# Patient Record
Sex: Female | Born: 1940 | Race: White | Hispanic: No | Smoking: Former smoker
Health system: Southern US, Community
[De-identification: ages and names within clinical notes are randomized; demographics above are authoritative.]

## PROBLEM LIST (undated history)

## (undated) DIAGNOSIS — E785 Hyperlipidemia, unspecified: Secondary | ICD-10-CM

## (undated) DIAGNOSIS — F39 Unspecified mood [affective] disorder: Secondary | ICD-10-CM

## (undated) DIAGNOSIS — I739 Peripheral vascular disease, unspecified: Secondary | ICD-10-CM

## (undated) DIAGNOSIS — F329 Major depressive disorder, single episode, unspecified: Secondary | ICD-10-CM

## (undated) DIAGNOSIS — I493 Ventricular premature depolarization: Secondary | ICD-10-CM

## (undated) DIAGNOSIS — F32A Depression, unspecified: Secondary | ICD-10-CM

## (undated) DIAGNOSIS — I779 Disorder of arteries and arterioles, unspecified: Secondary | ICD-10-CM

## (undated) DIAGNOSIS — E039 Hypothyroidism, unspecified: Secondary | ICD-10-CM

## (undated) HISTORY — DX: Unspecified mood (affective) disorder: F39

## (undated) HISTORY — PX: BREAST BIOPSY: SHX20

## (undated) HISTORY — DX: Ventricular premature depolarization: I49.3

## (undated) HISTORY — DX: Peripheral vascular disease, unspecified: I73.9

## (undated) HISTORY — DX: Hypothyroidism, unspecified: E03.9

## (undated) HISTORY — DX: Major depressive disorder, single episode, unspecified: F32.9

## (undated) HISTORY — DX: Hyperlipidemia, unspecified: E78.5

## (undated) HISTORY — DX: Depression, unspecified: F32.A

## (undated) HISTORY — DX: Disorder of arteries and arterioles, unspecified: I77.9

---

## 1988-01-20 HISTORY — PX: BREAST EXCISIONAL BIOPSY: SUR124

## 1994-01-19 HISTORY — PX: BREAST EXCISIONAL BIOPSY: SUR124

## 2001-02-21 ENCOUNTER — Encounter: Payer: Self-pay | Admitting: Internal Medicine

## 2001-02-21 ENCOUNTER — Encounter: Admission: RE | Admit: 2001-02-21 | Discharge: 2001-02-21 | Payer: Self-pay | Admitting: Internal Medicine

## 2001-06-23 ENCOUNTER — Emergency Department (HOSPITAL_COMMUNITY): Admission: EM | Admit: 2001-06-23 | Discharge: 2001-06-23 | Payer: Self-pay | Admitting: Emergency Medicine

## 2001-07-27 ENCOUNTER — Inpatient Hospital Stay (HOSPITAL_COMMUNITY): Admission: EM | Admit: 2001-07-27 | Discharge: 2001-08-01 | Payer: Self-pay | Admitting: Psychiatry

## 2001-07-27 ENCOUNTER — Encounter: Payer: Self-pay | Admitting: Emergency Medicine

## 2002-05-29 ENCOUNTER — Encounter: Admission: RE | Admit: 2002-05-29 | Discharge: 2002-05-29 | Payer: Self-pay | Admitting: Internal Medicine

## 2002-05-29 ENCOUNTER — Encounter: Payer: Self-pay | Admitting: Internal Medicine

## 2002-10-19 ENCOUNTER — Encounter: Payer: Self-pay | Admitting: Emergency Medicine

## 2002-10-19 ENCOUNTER — Emergency Department (HOSPITAL_COMMUNITY): Admission: EM | Admit: 2002-10-19 | Discharge: 2002-10-19 | Payer: Self-pay | Admitting: Emergency Medicine

## 2002-10-19 ENCOUNTER — Emergency Department (HOSPITAL_COMMUNITY): Admission: EM | Admit: 2002-10-19 | Discharge: 2002-10-20 | Payer: Self-pay | Admitting: Emergency Medicine

## 2005-01-29 ENCOUNTER — Other Ambulatory Visit: Admission: RE | Admit: 2005-01-29 | Discharge: 2005-01-29 | Payer: Self-pay | Admitting: Internal Medicine

## 2005-05-12 ENCOUNTER — Ambulatory Visit: Payer: Self-pay | Admitting: Internal Medicine

## 2005-06-04 ENCOUNTER — Encounter (INDEPENDENT_AMBULATORY_CARE_PROVIDER_SITE_OTHER): Payer: Self-pay | Admitting: *Deleted

## 2005-06-04 ENCOUNTER — Ambulatory Visit: Payer: Self-pay | Admitting: Internal Medicine

## 2005-06-17 ENCOUNTER — Ambulatory Visit (HOSPITAL_COMMUNITY): Payer: Self-pay | Admitting: *Deleted

## 2005-11-05 ENCOUNTER — Ambulatory Visit (HOSPITAL_COMMUNITY): Payer: Self-pay | Admitting: *Deleted

## 2006-02-02 ENCOUNTER — Ambulatory Visit (HOSPITAL_COMMUNITY): Payer: Self-pay | Admitting: *Deleted

## 2007-08-16 ENCOUNTER — Ambulatory Visit (HOSPITAL_COMMUNITY): Payer: Self-pay | Admitting: *Deleted

## 2007-08-22 ENCOUNTER — Emergency Department (HOSPITAL_COMMUNITY): Admission: EM | Admit: 2007-08-22 | Discharge: 2007-08-22 | Payer: Self-pay | Admitting: Emergency Medicine

## 2007-08-24 ENCOUNTER — Ambulatory Visit (HOSPITAL_BASED_OUTPATIENT_CLINIC_OR_DEPARTMENT_OTHER): Admission: RE | Admit: 2007-08-24 | Discharge: 2007-08-24 | Payer: Self-pay | Admitting: *Deleted

## 2008-02-07 ENCOUNTER — Ambulatory Visit (HOSPITAL_COMMUNITY): Payer: Self-pay | Admitting: *Deleted

## 2009-09-09 ENCOUNTER — Encounter: Admission: RE | Admit: 2009-09-09 | Discharge: 2009-09-09 | Payer: Self-pay | Admitting: Internal Medicine

## 2010-04-29 ENCOUNTER — Other Ambulatory Visit (HOSPITAL_COMMUNITY)
Admission: RE | Admit: 2010-04-29 | Discharge: 2010-04-29 | Disposition: A | Discharge status: Home / Self Care | Payer: Medicare Other | Source: Ambulatory Visit | Attending: Internal Medicine | Admitting: Internal Medicine

## 2010-04-29 ENCOUNTER — Other Ambulatory Visit: Payer: Self-pay | Admitting: Physician Assistant

## 2010-04-29 DIAGNOSIS — Z124 Encounter for screening for malignant neoplasm of cervix: Secondary | ICD-10-CM | POA: Insufficient documentation

## 2010-06-06 NOTE — Discharge Summary (Signed)
NAMEVANISSA, STRENGTH                             ACCOUNT NO.:  [id number]   MEDICAL RECORD NO.:  [id number]                   PATIENT TYPE:  IPS   LOCATION:  0506                                 FACILITY:  [facility]   PHYSICIAN:  Jeanice Lim, MD                DATE OF BIRTH:  Jun 20, 1940   DATE OF ADMISSION:  07/27/2001  DATE OF DISCHARGE:  08/01/2001                                 DISCHARGE SUMMARY   IDENTIFYING DATA:  This 70 year old divorced Caucasian female admitted with  a history of psychosis and confusion after, I think, several falls at home,  auditory hallucinations with a history of bipolar disorder.   MEDICATIONS:  Alprazolam, Celexa, Synthroid, Effexor, Zyprexa, Luvox,  Topamax and Depakote.   ALLERGIES:  CODEINE.   PHYSICAL EXAMINATION:  Essentially within normal limits.  Neurologically  nonfocal.  CT scan negative.   LABORATORY DATA:  CBC and CMET within normal limits.  Alcohol level less  than 5.   MENTAL STATUS EXAM:  Alert, middle-aged, casually dressed female.  Cooperative.  Fair eye contact.  Speech clear.  Mood anxious.  Affect  anxious.  Thought process goal directed.  Thought content positive for  paranoia and possible auditory hallucinations.  Cognitively intact.  Judgment and insight fair to poor.   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  2. Bipolar disorder, by history.   AXIS II:  None.   AXIS III:  1. Hypothyroidism.  2. Elevated cholesterol.   AXIS IV:  Moderate (problems related to support system).   AXIS V:  30/55.   HOSPITAL COURSE:  The patient was admitted and ordered routine p.r.n.  medications and underwent further monitoring.  She was continued on previous  medications.  Family meeting was held.  The patient responded to Zyprexa and  Effexor with improvement in mood and no side effects.   CONDITION ON DISCHARGE:  Improved.  Mood was more euthymic.  Affect  brighter.  Thought processes goal  directed.  Thought content negative for  dangerous ideation or psychotic symptoms.  The patient reported motivation  to be compliant with follow-up plan.   DISCHARGE MEDICATIONS:  1. Zyprexa 5 mg q.h.s.  2. Levothyroxine 100 mcg q.a.m.  3. Effexor XR 150 mg b.i.d.  4. Xanax 0.5 mg q.6h. p.r.n. severe anxiety.   FOLLOW UP:  __________ and Dr. Evelene Croon on August 09, 2001 at 5:45 p.m.   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  2. Bipolar disorder, by history.   AXIS II:  None.   AXIS III:  1. Hypothyroidism.  2. Elevated cholesterol.   AXIS IV:  Moderate (problems related to support system).   AXIS V:  Global Assessment of Functioning on discharge 55.  Jeanice Lim, MD    JEM/MEDQ  D:  09/14/2001  T:  09/17/2001  Job:  [id number]

## 2010-06-06 NOTE — Op Note (Signed)
NAMEKarsynn, Shari Horn                   ACCOUNT NO.:  [id number]   MEDICAL RECORD NO.:  [id number]          PATIENT TYPE:  AMB   LOCATION:  DSC                          FACILITY:  [facility]   PHYSICIAN:  Tennis Must Meyerdierks, M.D.DATE OF BIRTH:  1940-12-10   DATE OF PROCEDURE:  DATE OF DISCHARGE:                               OPERATIVE REPORT   PREOPERATIVE DIAGNOSIS:  Proximal phalanx fractures left long, ring, and  small fingers with distal metacarpal fracture left small finger.   POSTOPERATIVE DIAGNOSIS:  Proximal phalanx fractures left long, ring,  and small fingers with distal metacarpal fracture left small finger.   PROCEDURE:  Closed reduction and percutaneous pinning proximal phalanx  fractures left long and ring fingers.   SURGEON:  Lowell Bouton, MD   ANESTHESIA:  General.   OPERATIVE FINDINGS:  The patient had displaced transverse fracture  through the base of the proximal phalanx of the long and ring fingers.  The small finger proximal phalanx fracture and metacarpal fracture were  not significantly displaced.  There were abrasions, but no open  fractures.  The ring finger was angulated and rotated.  The long finger  was angulated.   PROCEDURE:  Under general anesthesia with a tourniquet on the left arm,  the left hand was prepped and draped in the usual fashion, and after  exsanguinating the limb the tourniquet was inflated to 250 mmHg.  The  long finger was treated first with a closed reduction of the fracture.  A 0.045 K-wire was placed longitudinally across the MP joint into the  proximal phalanx.  Fluoroscopy was used, and x-rays showed good position  of the fracture.  A second longitudinal K-wire was then applied to  control rotation.  It paralleled the first.  X-rays showed good  position, and these pins were bent over and left protruding from the  skin.  The ring finger was then addressed and was reduced trying to  correct the rotation and the  angulation.  A 0.045 K-wire was placed  longitudinally through the metacarpal head into the proximal phalanx.  X-  rays showed good position, and clinically there was no rotational  malalignment.  A second K-wire was then placed parallel to the first,  and x-rays showed good position of the K-wires.  They were bent over and  left protruding from the skin.  The small finger fractures were stable  and were not pinned.  Sterile dressings were then applied followed by a  dorsal protective splint.  Marcaine 0.5% digital blocks were inserted  for pain control.  The tourniquet was released with good circulation to  the hand.  The patient went to the recovery room awake and stable in  good condition.      Lowell Bouton, M.D.  Electronically Signed     EMM/MEDQ  D:  08/25/2007  T:  08/25/2007  Job:  [id number]

## 2010-06-06 NOTE — H&P (Signed)
[facility]  Patient:    CORIANA, ANGELLO Visit Number: [id number] MRN: [id number]          Service Type: EMS Location: ED Attending Physician:  Sandi Raveling Dictated by:   Candi Leash. Orsini, N.P. Admit Date:  07/27/2001 Discharge Date: 07/27/2001                     Psychiatric Admission Assessment  IDENTIFYING INFORMATION:  A 70 year old divorced white female, voluntarily admitted on July 27, 2001.  HISTORY OF PRESENT ILLNESS:  The patient presents with a history of psychosis and confusion, reports her medications have been increased about a month ago and has had several falls at home.  She has hit her head a couple of times on occasion.  Having positive auditory hallucinations, hearing singing and ______ voices.  Positive paranoia, has accusing her neighbor of hitting her walls on the outside and feeling people around her.  She denies any depressive symptoms or suicidal ideation or homicidal ideation.  She reports she started having tics and hand tremors and was started on Inderal q.h.s.  She reports no significant stressors in her life.  She states her sleep has been satisfactory with Xanax that she takes in the evening.  Her appetite has been satisfactory.  PAST PSYCHIATRIC HISTORY:  Sees Dr. Evelene Croon.  On a remote basis was seeing a psychiatrist at [facility].  Last seen was 1 week ago.  The patient was hospitalized at [facility] for depression in 1994, has a history of bipolar disorder.  SOCIAL HISTORY:   She is a 70 year old divorced white female, divorced for 9 years, 2 grown children.  She lives alone, she is unemployed, no legal charges.  FAMILY HISTORY:  None.  ALCOHOL DRUG HISTORY:  Nonsmoker, denies any alcohol or substance abuse.  PAST MEDICAL HISTORY:  Primary care Rhett Najera is Dr. Julieanne Manson in [place] on [facility] and psychiatrist in [place].  The patient reports her last name is Rezlin.   Medical problems are hypothyroidism.  The patient has been on thyroid medications, alprazolam 0.5 mg 1 q.6h., Celexa 40 2 pills in the morning, Synthroid 0.1  mg 1 q.a.m., Effexor XR 150 mg 1 t.i.d. at 1 p.m., 5 p.m. and 8 p.m., Zyprexa 2.5 mg 2 q.h.s., Eskalith CR 450 mg 1 a.m., 1 at 1700.  Luvox 50 mg 2 q.h.s., propranolol 10 mg 1 q.h.s., Niaspan 1000 mg 1 q.a.m., 1 at 5 p.m., Depakote 500 mg 1 q.a.m., 1 at 1700, 1  q.6h. p.r.n., Topamax 25 mg 4 in the morning, 2 at 1 p.m. and 2 at 5 p.m.  DRUG ALLERGIES:  CODEINE.  PHYSICAL EXAMINATION:  Performed at [facility].  The patient is CAT scan negative.  RBC is 3.6, hematocrit is 35.9.  CMET is within normal limits. Alcohol level was less than 5.  MENTAL STATUS EXAMINATION:  She is an alert, middle-aged, casually dressed female, cooperative, fair eye contact.  Speech is clear, mood is anxious, affect is anxious.  Thought process are coherent, goal directed.  No current auditory or visual hallucinations, no suicidal or homicidal ideations, continues with some paranoid ideation.  Cognitive:  Her memory is fair, her judgment and insight are fair, oriented x3.  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            2. Bipolar disorder by history. Axis II:   Deferred. Axis III:  Hypothyroidism, elevated cholesterol. Axis  IV:   Deferred. Axis V:    Current is 30, this past year 65-70.  PLAN:  Voluntary admission for psychosis and increased confusion.  Contract for safety, check every 15 minutes.  The patient is considered to be a fall risk.  Treatment plan was discussed with Dr. Kathrynn Running.  We will resume her Effexor XR on a b.i.d. basis.  We will have Ativan available for anxiety and Zyprexa.  Will contact her daughter for background information and who has been following the patients psychiatric illness.  The patient to follow up with mental health or private psychiatrist.  TENTATIVE LENGTH OF CARE:  4-5 days or  more depending on patients response to medications. Dictated by:   Candi Leash. Orsini, N.P. Attending Physician:  Sandi Raveling DD:  07/29/01 TD:  07/31/01 Job: [id number]

## 2010-06-09 ENCOUNTER — Encounter: Payer: Self-pay | Admitting: Internal Medicine

## 2010-10-17 LAB — CBC
HCT: 37.6
Hemoglobin: 13
MCHC: 34.6
Platelets: 287
RDW: 13.6

## 2010-10-17 LAB — POCT I-STAT, CHEM 8
BUN: 20
Calcium, Ion: 1.17
Creatinine, Ser: 0.9
Glucose, Bld: 79
Hemoglobin: 13.3
TCO2: 24

## 2010-10-17 LAB — DIFFERENTIAL
Basophils Absolute: 0.1
Basophils Relative: 1
Eosinophils Relative: 2
Lymphocytes Relative: 27
Monocytes Absolute: 1
Neutro Abs: 6.1

## 2010-10-17 LAB — PROTIME-INR: Prothrombin Time: 13.1

## 2011-12-20 HISTORY — PX: OTHER SURGICAL HISTORY: SHX169

## 2011-12-20 HISTORY — PX: TRANSTHORACIC ECHOCARDIOGRAM: SHX275

## 2011-12-21 ENCOUNTER — Other Ambulatory Visit (HOSPITAL_COMMUNITY): Payer: Self-pay | Admitting: Internal Medicine

## 2011-12-21 DIAGNOSIS — I6529 Occlusion and stenosis of unspecified carotid artery: Secondary | ICD-10-CM

## 2011-12-21 DIAGNOSIS — R0600 Dyspnea, unspecified: Secondary | ICD-10-CM

## 2011-12-21 DIAGNOSIS — I493 Ventricular premature depolarization: Secondary | ICD-10-CM

## 2011-12-21 DIAGNOSIS — R002 Palpitations: Secondary | ICD-10-CM

## 2012-01-06 ENCOUNTER — Ambulatory Visit (HOSPITAL_COMMUNITY)
Admission: RE | Admit: 2012-01-06 | Discharge: 2012-01-06 | Disposition: A | Discharge status: Home / Self Care | Payer: Medicare Other | Source: Ambulatory Visit | Attending: Internal Medicine | Admitting: Internal Medicine

## 2012-01-06 DIAGNOSIS — R002 Palpitations: Secondary | ICD-10-CM

## 2012-01-06 DIAGNOSIS — R0600 Dyspnea, unspecified: Secondary | ICD-10-CM

## 2012-01-06 DIAGNOSIS — I6529 Occlusion and stenosis of unspecified carotid artery: Secondary | ICD-10-CM | POA: Insufficient documentation

## 2012-01-06 DIAGNOSIS — I493 Ventricular premature depolarization: Secondary | ICD-10-CM

## 2012-01-06 NOTE — Progress Notes (Signed)
2D Echo Performed 01/06/2012    Warnell Rasnic, RCS  

## 2012-01-06 NOTE — Progress Notes (Signed)
Carotid duplex completed 

## 2012-02-01 ENCOUNTER — Other Ambulatory Visit (HOSPITAL_COMMUNITY)
Admission: RE | Admit: 2012-02-01 | Discharge: 2012-02-01 | Disposition: A | Discharge status: Home / Self Care | Payer: Medicare Other | Source: Ambulatory Visit | Attending: Family Medicine | Admitting: Family Medicine

## 2012-02-01 ENCOUNTER — Other Ambulatory Visit: Payer: Self-pay | Admitting: Family Medicine

## 2012-02-01 DIAGNOSIS — Z0000 Encounter for general adult medical examination without abnormal findings: Secondary | ICD-10-CM | POA: Insufficient documentation

## 2012-12-09 ENCOUNTER — Telehealth (HOSPITAL_COMMUNITY): Payer: Self-pay | Admitting: *Deleted

## 2012-12-19 ENCOUNTER — Encounter: Payer: Self-pay | Admitting: *Deleted

## 2012-12-20 ENCOUNTER — Ambulatory Visit (INDEPENDENT_AMBULATORY_CARE_PROVIDER_SITE_OTHER): Payer: Medicare Other | Admitting: Internal Medicine

## 2012-12-20 ENCOUNTER — Encounter: Payer: Self-pay | Admitting: Internal Medicine

## 2012-12-20 VITALS — BP 128/82 | HR 82 | Ht 66.0 in | Wt 199.5 lb

## 2012-12-20 DIAGNOSIS — I4949 Other premature depolarization: Secondary | ICD-10-CM

## 2012-12-20 DIAGNOSIS — E785 Hyperlipidemia, unspecified: Secondary | ICD-10-CM

## 2012-12-20 DIAGNOSIS — I493 Ventricular premature depolarization: Secondary | ICD-10-CM | POA: Insufficient documentation

## 2012-12-20 DIAGNOSIS — I779 Disorder of arteries and arterioles, unspecified: Secondary | ICD-10-CM

## 2012-12-20 DIAGNOSIS — R002 Palpitations: Secondary | ICD-10-CM | POA: Insufficient documentation

## 2012-12-20 NOTE — Patient Instructions (Signed)
Your physician wants you to follow-up in: 1 year. You will receive a reminder letter in the mail two months in advance. If you don't receive a letter, please call our office to schedule the follow-up appointment.  Please schedule your carotid doppler.   Please have fasting blood work.

## 2012-12-20 NOTE — Progress Notes (Signed)
OFFICE NOTE  Chief Complaint:  Mild DOE  Primary Care Physician: Cain Saupe, MD  HPI:  Shari Horn is a 72 year old female who was referred to me for irregular heartbeat and PVCs, as well as an abnormal screening carotid ultrasound. We repeated that carotid Doppler which indicates increased velocity on the left internal carotid up to 206 cm/second in the posterior part of the internal carotid artery consistent with 50-69% left internal carotid artery stenosis. There was a mild amount of plaque in the right carotid, 0-49% stenosis, neither of which meets criteria for intervention at this time. In addition we performed an echocardiogram in the office, indicating an EF of 60-65%, mild diastolic dysfunction. There was trivial mitral regurgitation and aortic regurgitation, but no significant stenosis. I reviewed the results with her today and feel like she is doing quite well but would recommend a repeat lipid profile and more aggressive lipid management as necessary. I have copied you on that laboratory work so that you may review it, and we will plan to see her back annually with repeat carotid Dopplers at that time. She, in fact, was due to go over repeat dopplers today, but has not had them performed.  She was having palpitations, PVCs which are not as worrisome given the fact that she has normal LV function and no signs or symptoms of ischemia. I recommended decreasing her caffeine intake which may be difficult to do as she is reportedly self-addicted, but she does drink about 1 small pot, or 4 cups of coffee every day and that certainly can contribute significantly to her PVCs. She now reports the palpitations have improved.  Finally, she recently had cholesterol testing through her primary care provider. Her total cholesterol was 233, triglycerides 183, HDL 41 LDL 156. Based on this for right torn was increased from 10/20 mg to 10/40 mg. Her goal LDL, however is less than 70, which means she needs  an additional 50% reduction in her cholesterol. I'm not sure that this is achievable with an increase in her simvastatin by 20 mg.  PMHx:  Past Medical History  Diagnosis Date  . PVC's (premature ventricular contractions)   . Dyslipidemia   . Mood disorder     BPD  . Hypothyroidism   . Carotid artery disease     Past Surgical History  Procedure Laterality Date  . Transthoracic echocardiogram  12/2011    EF 60-65%, grade 1 diastolic dysfunction; trivial AV regurg; calcified MV annulus  . Carotid doppler  12/2011    0-49% RICA stenosis, 50-69% LICA stenosis    FAMHx:  Family History  Problem Relation Age of Onset  . Heart failure Mother   . Arthritis/Rheumatoid Father   . Heart Problems Maternal Grandmother   . Heart disease Brother     valve replacement  . Heart disease Child     SOCHx:   reports that she quit smoking about 40 years ago. Her smoking use included Cigarettes. She smoked 0.00 packs per day for 10 years. She has never used smokeless tobacco. She reports that she does not drink alcohol or use illicit drugs.  ALLERGIES:  Allergies  Allergen Reactions  . Codeine     ROS: A comprehensive review of systems was negative except for: Respiratory: positive for dyspnea on exertion  HOME MEDS: Current Outpatient Prescriptions  Medication Sig Dispense Refill  . aspirin 81 MG tablet Take 81 mg by mouth daily.      . clonazePAM (KLONOPIN) 0.5 MG tablet  Take 0.5 mg by mouth 3 (three) times daily as needed for anxiety.      Marland Kitchen ezetimibe-simvastatin (VYTORIN) 10-40 MG per tablet Take 1 tablet by mouth daily.      Marland Kitchen FLUoxetine (PROZAC) 20 MG tablet Take 60 mg by mouth daily.      Marland Kitchen levothyroxine (SYNTHROID, LEVOTHROID) 88 MCG tablet Take 88 mcg by mouth daily before breakfast.      . Multiple Vitamin (MULTIVITAMIN) capsule Take 1 capsule by mouth daily.      . QUEtiapine (SEROQUEL) 300 MG tablet Take 300 mg by mouth at bedtime.      . senna (SENOKOT) 8.6 MG TABS tablet  Take 1 tablet by mouth at bedtime.       No current facility-administered medications for this visit.    LABS/IMAGING: No results found for this or any previous visit (from the past 48 hour(s)). No results found.  VITALS: BP 128/82  Pulse 82  Ht 5\' 6"  (1.676 m)  Wt 199 lb 8 oz (90.493 kg)  BMI 32.22 kg/m2  EXAM: General appearance: alert and no distress Neck: supple, faint carotid bruit on the left Lungs: clear to auscultation bilaterally Heart: regular rate and rhythm, S1, S2 normal, no murmur, click, rub or gallop Abdomen: soft, non-tender; bowel sounds normal; no masses,  no organomegaly Extremities: extremities normal, atraumatic, no cyanosis or edema Pulses: 2+ and symmetric Skin: Skin color, texture, turgor normal. No rashes or lesions Neurologic: Grossly normal Psych: Mood, affect normal  EKG: Normal sinus rhythm at 82  ASSESSMENT: 1. Bilateral carotid artery disease 2. Dyslipidemia-not at goal LDL less than 70 3. Palpitations-resolved  PLAN: 1.   Mrs. Kulkarni is due for repeat Dopplers. I would like to see if there's been stability of her carotid stenoses over the past year. I am concerned that she has not been able to lose much weight and exercise more vigorously. Her cholesterol remains high an LDL goal of less than 70 he is not being achieved with Vytorin. I would like to repeat her cholesterol to see the benefit she's had over the past several months with the increase in her medicine. If her LDL remains elevated, I would change her cholesterol medication over to high-dose Crestor 40 mg and Zetia 10 mg.  I will contact her with the results of her Dopplers once we obtain them. Hopefully that will be in the next week as she is planning on going to [place] for very interesting trip.  Chrystie Nose, MD, [facility] Attending Cardiologist [facility]  Lyly Canizales C 12/20/2012, 6:00 PM

## 2012-12-21 ENCOUNTER — Other Ambulatory Visit (HOSPITAL_COMMUNITY): Payer: Self-pay | Admitting: Internal Medicine

## 2012-12-23 ENCOUNTER — Ambulatory Visit (HOSPITAL_COMMUNITY)
Admission: RE | Admit: 2012-12-23 | Discharge: 2012-12-23 | Disposition: A | Discharge status: Home / Self Care | Payer: Medicare Other | Source: Ambulatory Visit | Attending: Internal Medicine | Admitting: Internal Medicine

## 2012-12-23 DIAGNOSIS — I6529 Occlusion and stenosis of unspecified carotid artery: Secondary | ICD-10-CM | POA: Insufficient documentation

## 2012-12-23 DIAGNOSIS — I658 Occlusion and stenosis of other precerebral arteries: Secondary | ICD-10-CM | POA: Insufficient documentation

## 2012-12-23 NOTE — Progress Notes (Signed)
Carotid Duplex Completed. °Brianna L Mazza,RVT °

## 2012-12-27 LAB — NMR LIPOPROFILE WITH LIPIDS
Cholesterol, Total: 133 mg/dL (ref <200)
HDL Particle Number: 37 umol/L (ref >=30.5)
LDL Size: 20.1 nm — ABNORMAL LOW (ref >20.5)
LP-IR Score: 60 — ABNORMAL HIGH (ref <=45)
Large HDL-P: 2.9 umol/L — ABNORMAL LOW (ref >=4.8)
Large VLDL-P: 2.2 nmol/L (ref <=2.7)
Small LDL Particle Number: 872 nmol/L — ABNORMAL HIGH (ref <=527)

## 2012-12-30 ENCOUNTER — Telehealth: Payer: Self-pay | Admitting: *Deleted

## 2012-12-30 DIAGNOSIS — E785 Hyperlipidemia, unspecified: Secondary | ICD-10-CM

## 2012-12-30 DIAGNOSIS — I779 Disorder of arteries and arterioles, unspecified: Secondary | ICD-10-CM

## 2012-12-30 MED ORDER — EZETIMIBE 10 MG PO TABS: 10.0000 mg | ORAL_TABLET | Freq: Every day | ORAL | Status: DC

## 2012-12-30 MED ORDER — ROSUVASTATIN CALCIUM 20 MG PO TABS: 40.0000 mg | ORAL_TABLET | Freq: Every day | ORAL | Status: DC

## 2012-12-30 MED ORDER — ROSUVASTATIN CALCIUM 40 MG PO TABS: 40.0000 mg | ORAL_TABLET | Freq: Every day | ORAL | Status: DC

## 2012-12-30 NOTE — Telephone Encounter (Signed)
Message copied by Lindell Spar on Fri Dec 30, 2012  9:10 AM ------      Message from: Chrystie Nose      Created: Thu Dec 29, 2012  1:30 PM       LDL is 76.  Particle number still to high.  Change Vytorin to Crestor 40 mg daily and also add Zetia 10 mg daily.  Repeat lipid NMR in 3 months.            -Dr. Rennis Golden ------

## 2012-12-30 NOTE — Telephone Encounter (Signed)
Contacted patient with carotid doppler and NMR results. Instructed of new medications per Dr. Rennis Golden - meds, labs ordered.. Samples left at front desk for patient. Copy of labs mailed to patient with lab slips

## 2013-09-06 ENCOUNTER — Ambulatory Visit: Payer: Medicare Other | Admitting: Neurology

## 2013-09-07 ENCOUNTER — Ambulatory Visit (INDEPENDENT_AMBULATORY_CARE_PROVIDER_SITE_OTHER): Payer: Medicare Other | Admitting: Neurology

## 2013-09-07 ENCOUNTER — Encounter: Payer: Self-pay | Admitting: Neurology

## 2013-09-07 VITALS — BP 120/76 | HR 82 | Resp 16 | Ht 66.5 in | Wt 197.0 lb

## 2013-09-07 DIAGNOSIS — H81319 Aural vertigo, unspecified ear: Secondary | ICD-10-CM

## 2013-09-07 DIAGNOSIS — IMO0002 Reserved for concepts with insufficient information to code with codable children: Secondary | ICD-10-CM

## 2013-09-07 NOTE — Progress Notes (Signed)
NEUROLOGY CONSULTATION NOTE  Shari Horn MRN: [id number] DOB: Feb 21, 1940  Referring provider: Eloise Levels, NP Primary care provider: Dr. Antony Blackbird  Reason for consult:  dysequilibrium  Thank you for your kind referral of Shari Horn for consultation of the above symptoms. Although her history is well known to you, please allow me to reiterate it for the purpose of our medical record. Records and images were personally reviewed where available.  HISTORY OF PRESENT ILLNESS: This is a pleasant 73 year old right-handed woman with a history of hyperlipidemia, bilateral carotid artery disease, bipolar disorder, presenting for persistent dizziness that is provoked by head turning in bed.  She has a history of severe vertigo 8-10 years ago where she could not stand up and needed hospital admission. Around 2 months ago, she woke up with spinning sensation, nausea, and gait instability for 2 weeks. This improved, however she continues to have a sensation of floating and spinning when turning her head in bed, particularly to the right side. She notices a similar sensation when standing up and looking around.  She does not notice it when driving. The nausea has resolved. There is no associated headache, no focal numbness/tingling/weakness, incoordination, diplopia, dysarthria, dysphagia. She occasionally hears a clicking sound in her right ear for the past 4-6 months and had been recently evaluated by ENT.  On review of most recent ENT visit notes from 08/24/13, patient was seen for dizziness, assessment was cerumen impaction, vertigo, hearing loss, and chronic otitis externa. She had reported fullness in ears, itching, and possible hearing loss.  Cerumen removal was done and patient scheduled for hearing test.    She denies any recent illnesses or head injuries.  She did have chills when the vertigo was severe, but no fever.  She denies any neck/back pain, bowel/bladder dysfunction.  She has a pressure  in her temples today but states she does not normally get headaches.  She has been taking Seroquel for bipolar disorder for many years, and clonazepam twice a day for at least 10-12 years.  Her brother and sister have seizures, no family history of vertigo.  PAST MEDICAL HISTORY: Past Medical History  Diagnosis Date  . PVC's (premature ventricular contractions)   . Dyslipidemia   . Mood disorder     BPD  . Hypothyroidism   . Carotid artery disease     PAST SURGICAL HISTORY: Past Surgical History  Procedure Laterality Date  . Transthoracic echocardiogram  12/2011    EF 62-37%, grade 1 diastolic dysfunction; trivial AV regurg; calcified MV annulus  . Carotid doppler  12/2011    0-49% RICA stenosis, 62-83% LICA stenosis  . Breast biopsy      MEDICATIONS: Current Outpatient Prescriptions on File Prior to Visit  Medication Sig Dispense Refill  . aspirin 81 MG tablet Take 81 mg by mouth daily.      . clonazePAM (KLONOPIN) 0.5 MG tablet Take 0.5 mg by mouth 3 (three) times daily as needed for anxiety.      Marland Kitchen ezetimibe (ZETIA) 10 MG tablet Take 1 tablet (10 mg total) by mouth daily.  29 tablet  0  . FLUoxetine (PROZAC) 20 MG tablet Take 60 mg by mouth daily.      Marland Kitchen levothyroxine (SYNTHROID, LEVOTHROID) 88 MCG tablet Take 88 mcg by mouth daily before breakfast.      . Multiple Vitamin (MULTIVITAMIN) capsule Take 1 capsule by mouth daily.      . QUEtiapine (SEROQUEL) 300 MG tablet Take 300  mg by mouth at bedtime.      . rosuvastatin (CRESTOR) 20 MG tablet Take 2 tablets (40 mg total) by mouth daily.  56 tablet  0  . senna (SENOKOT) 8.6 MG TABS tablet Take 1 tablet by mouth at bedtime.       No current facility-administered medications on file prior to visit.    ALLERGIES: Allergies  Allergen Reactions  . Codeine Nausea And Vomiting    FAMILY HISTORY: Family History  Problem Relation Age of Onset  . Heart failure Mother   . Arthritis/Rheumatoid Father   . Heart Problems  Maternal Grandmother   . Heart disease Brother     valve replacement  . Heart disease Child     SOCIAL HISTORY: History   Social History  . Marital Status: Divorced    Spouse Name: N/A    Number of Children: 2  . Years of Education: master's   Occupational History  . Not on file.   Social History Main Topics  . Smoking status: Former Smoker -- 10 years    Types: Cigarettes    Quit date: 12/19/1972  . Smokeless tobacco: Never Used  . Alcohol Use: No     Comment: quit in 1973  . Drug Use: No  . Sexual Activity: Not on file   Other Topics Concern  . Not on file   Social History Narrative  . No narrative on file    REVIEW OF SYSTEMS: Constitutional: No fevers, chills, or sweats, no generalized fatigue, change in appetite Eyes: No visual changes, double vision, eye pain Ear, nose and throat: No hearing loss, ear pain, nasal congestion, sore throat Cardiovascular: No chest pain, palpitations Respiratory:  No shortness of breath at rest or with exertion, wheezes GastrointestinaI: No nausea, vomiting, diarrhea, abdominal pain, fecal incontinence Genitourinary:  No dysuria, urinary retention or frequency Musculoskeletal:  No neck pain, back pain Integumentary: No rash, pruritus, skin lesions Neurological: as above Psychiatric: No depression, insomnia, anxiety Endocrine: No palpitations, fatigue, diaphoresis, mood swings, change in appetite, change in weight, increased thirst Hematologic/Lymphatic:  No anemia, purpura, petechiae. Allergic/Immunologic: no itchy/runny eyes, nasal congestion, recent allergic reactions, rashes  PHYSICAL EXAM: Filed Vitals:   09/07/13 1023  BP: 120/76  Pulse: 82  Resp: 16   General: No acute distress Head:  Normocephalic/atraumatic Eyes: Fundoscopic exam shows bilateral sharp discs, no vessel changes, exudates, or hemorrhages Neck: supple, no paraspinal tenderness, full range of motion Back: No paraspinal tenderness Heart: regular rate  and rhythm Lungs: Clear to auscultation bilaterally. Vascular: No carotid bruits. Skin/Extremities: No rash, no edema Neurological Exam: Mental status: alert and oriented to person, place, and time, no dysarthria or aphasia, Fund of knowledge is appropriate.  Recent and remote memory are intact.  Attention and concentration are normal.    Able to name objects and repeat phrases. Cranial nerves: CN I: not tested CN II: pupils equal, round and reactive to light, visual fields intact, fundi unremarkable. CN III, IV, VI:  full range of motion, no nystagmus, no ptosis CN V: facial sensation intact CN VII: upper and lower face symmetric CN VIII: hearing intact to finger rub CN IX, X: gag intact, uvula midline CN XI: sternocleidomastoid and trapezius muscles intact CN XII: tongue midline Bulk & Tone: normal, no fasciculations. Motor: 5/5 throughout with no pronator drift. Sensation: intact to light touch, cold, pin, vibration and joint position sense.  No extinction to double simultaneous stimulation.  Romberg test negative Deep Tendon Reflexes: +2 throughout, no ankle clonus  Plantar responses: downgoing bilaterally Cerebellar: no incoordination on finger to nose, heel to shin. No dysdiadochokinesia Gait: narrow-based and steady, able to tandem walk adequately. Tremor: none Dix-Hallpike test showed nystagmus and reproduced symptoms on right head turn.  IMPRESSION: This is a pleasant 73 year old right-handed woman with a history of dyslipidemia, peripheral artery disease, bipolar disorder, presenting for positional vertigo with head turn to right.  Neurological exam normal, with Dix-Hallpike positive on right head turn, consistent with BPPV.  She will be referred for vestibular therapy.  If symptoms persist despite vestibular therapy, brain imaging will be considered. Findings were discussed with the patient, she knows to call our office for any change in symptoms and will follow-up in 2  months.  Thank you for allowing me to participate in the care of this patient. Please do not hesitate to call for any questions or concerns.   Ellouise Newer, M.D.  CC: Dr. Chapman Fitch

## 2013-09-07 NOTE — Patient Instructions (Addendum)
1. Schedule for vestibular therapy 2. Follow-up in 2 months, call our office for any change in symptoms

## 2013-09-08 ENCOUNTER — Encounter: Payer: Self-pay | Admitting: Neurology

## 2013-09-08 DIAGNOSIS — IMO0002 Reserved for concepts with insufficient information to code with codable children: Secondary | ICD-10-CM | POA: Insufficient documentation

## 2013-09-12 ENCOUNTER — Ambulatory Visit: Payer: Medicare Other | Attending: Neurology | Admitting: Physical Therapy

## 2013-09-12 DIAGNOSIS — H8110 Benign paroxysmal vertigo, unspecified ear: Secondary | ICD-10-CM | POA: Diagnosis not present

## 2013-09-12 DIAGNOSIS — IMO0001 Reserved for inherently not codable concepts without codable children: Secondary | ICD-10-CM | POA: Diagnosis not present

## 2013-09-15 ENCOUNTER — Ambulatory Visit: Payer: Medicare Other | Admitting: Physical Therapy

## 2013-09-15 DIAGNOSIS — IMO0001 Reserved for inherently not codable concepts without codable children: Secondary | ICD-10-CM | POA: Diagnosis not present

## 2013-09-19 ENCOUNTER — Ambulatory Visit: Payer: Medicare Other | Attending: Neurology

## 2013-09-19 DIAGNOSIS — H8110 Benign paroxysmal vertigo, unspecified ear: Secondary | ICD-10-CM | POA: Insufficient documentation

## 2013-09-19 DIAGNOSIS — IMO0001 Reserved for inherently not codable concepts without codable children: Secondary | ICD-10-CM | POA: Diagnosis present

## 2013-09-22 ENCOUNTER — Ambulatory Visit: Payer: Medicare Other | Admitting: Physical Therapy

## 2013-09-22 DIAGNOSIS — IMO0001 Reserved for inherently not codable concepts without codable children: Secondary | ICD-10-CM | POA: Diagnosis not present

## 2013-09-26 ENCOUNTER — Encounter: Payer: Medicare Other | Admitting: Physical Therapy

## 2013-09-27 ENCOUNTER — Encounter: Payer: Medicare Other | Admitting: Physical Therapy

## 2013-10-27 ENCOUNTER — Telehealth: Payer: Self-pay | Admitting: *Deleted

## 2013-10-27 NOTE — Telephone Encounter (Signed)
Patient cancelled follow up appointment will not be rescheduling at this time

## 2013-10-27 NOTE — Telephone Encounter (Signed)
Noted  

## 2013-11-03 ENCOUNTER — Ambulatory Visit: Payer: Medicare Other | Admitting: Neurology

## 2013-12-11 ENCOUNTER — Ambulatory Visit (HOSPITAL_COMMUNITY)
Admission: RE | Admit: 2013-12-11 | Discharge: 2013-12-11 | Disposition: A | Discharge status: Home / Self Care | Payer: Medicare Other | Source: Ambulatory Visit | Attending: Cardiology | Admitting: Cardiology

## 2013-12-11 ENCOUNTER — Telehealth: Payer: Self-pay | Admitting: Internal Medicine

## 2013-12-11 DIAGNOSIS — E785 Hyperlipidemia, unspecified: Secondary | ICD-10-CM

## 2013-12-11 DIAGNOSIS — I672 Cerebral atherosclerosis: Secondary | ICD-10-CM | POA: Insufficient documentation

## 2013-12-11 DIAGNOSIS — I739 Peripheral vascular disease, unspecified: Secondary | ICD-10-CM

## 2013-12-11 DIAGNOSIS — I779 Disorder of arteries and arterioles, unspecified: Secondary | ICD-10-CM

## 2013-12-11 NOTE — Progress Notes (Signed)
Carotid Duplex Completed. Azucena Dart, BS, RDMS, RVT  

## 2013-12-11 NOTE — Telephone Encounter (Signed)
Order placed for labs.

## 2013-12-13 LAB — NMR LIPOPROFILE WITH LIPIDS
Cholesterol, Total: 167 mg/dL (ref 100–199)
HDL PARTICLE NUMBER: 36.1 umol/L (ref >=30.5)
HDL Size: 8.5 nm — ABNORMAL LOW (ref >=9.2)
HDL-C: 44 mg/dL (ref >39)
LARGE VLDL-P: 4.9 nmol/L — AB (ref <=2.7)
LDL (calc): 100 mg/dL — ABNORMAL HIGH (ref 0–99)
LDL Particle Number: 1795 nmol/L — ABNORMAL HIGH (ref <1000)
LDL Size: 21 nm (ref >=20.8)
LP-IR SCORE: 82 — AB (ref <=45)
Large HDL-P: <1.3 umol/L — ABNORMAL LOW (ref >=4.8)
SMALL LDL PARTICLE NUMBER: 1049 nmol/L — AB (ref <=527)
Triglycerides: 116 mg/dL (ref 0–149)
VLDL Size: 53.5 nm — ABNORMAL HIGH (ref <=46.6)

## 2013-12-18 ENCOUNTER — Other Ambulatory Visit: Payer: Self-pay | Admitting: *Deleted

## 2013-12-18 ENCOUNTER — Telehealth: Payer: Self-pay | Admitting: Internal Medicine

## 2013-12-18 DIAGNOSIS — I779 Disorder of arteries and arterioles, unspecified: Secondary | ICD-10-CM

## 2013-12-18 DIAGNOSIS — I739 Peripheral vascular disease, unspecified: Principal | ICD-10-CM

## 2013-12-18 NOTE — Telephone Encounter (Signed)
Close encounter 

## 2014-01-10 ENCOUNTER — Emergency Department (HOSPITAL_COMMUNITY)
Admission: EM | Admit: 2014-01-10 | Discharge: 2014-01-10 | Disposition: A | Discharge status: Home / Self Care | Payer: Medicare Other | Attending: Emergency Medicine | Admitting: Emergency Medicine

## 2014-01-10 ENCOUNTER — Emergency Department (HOSPITAL_COMMUNITY): Payer: Medicare Other

## 2014-01-10 ENCOUNTER — Encounter (HOSPITAL_COMMUNITY): Payer: Self-pay | Admitting: *Deleted

## 2014-01-10 DIAGNOSIS — Z8659 Personal history of other mental and behavioral disorders: Secondary | ICD-10-CM | POA: Diagnosis not present

## 2014-01-10 DIAGNOSIS — Z87891 Personal history of nicotine dependence: Secondary | ICD-10-CM | POA: Insufficient documentation

## 2014-01-10 DIAGNOSIS — Z7982 Long term (current) use of aspirin: Secondary | ICD-10-CM | POA: Diagnosis not present

## 2014-01-10 DIAGNOSIS — R110 Nausea: Secondary | ICD-10-CM | POA: Diagnosis not present

## 2014-01-10 DIAGNOSIS — R079 Chest pain, unspecified: Secondary | ICD-10-CM | POA: Diagnosis present

## 2014-01-10 DIAGNOSIS — Z8679 Personal history of other diseases of the circulatory system: Secondary | ICD-10-CM | POA: Diagnosis not present

## 2014-01-10 DIAGNOSIS — E039 Hypothyroidism, unspecified: Secondary | ICD-10-CM | POA: Diagnosis not present

## 2014-01-10 DIAGNOSIS — Z79899 Other long term (current) drug therapy: Secondary | ICD-10-CM | POA: Diagnosis not present

## 2014-01-10 DIAGNOSIS — E785 Hyperlipidemia, unspecified: Secondary | ICD-10-CM | POA: Diagnosis not present

## 2014-01-10 LAB — CBC
HEMATOCRIT: 41.1 % (ref 36.0–46.0)
Hemoglobin: 13.9 g/dL (ref 12.0–15.0)
MCH: 32 pg (ref 26.0–34.0)
MCHC: 33.8 g/dL (ref 30.0–36.0)
MCV: 94.5 fL (ref 78.0–100.0)
PLATELETS: 254 10*3/uL (ref 150–400)
RBC: 4.35 MIL/uL (ref 3.87–5.11)
RDW: 12.8 % (ref 11.5–15.5)
WBC: 7.5 10*3/uL (ref 4.0–10.5)

## 2014-01-10 LAB — BASIC METABOLIC PANEL
ANION GAP: 7 (ref 5–15)
BUN: 9 mg/dL (ref 6–23)
CHLORIDE: 109 meq/L (ref 96–112)
CO2: 22 mmol/L (ref 19–32)
Calcium: 9.2 mg/dL (ref 8.4–10.5)
Creatinine, Ser: 0.88 mg/dL (ref 0.50–1.10)
GFR calc Af Amer: 73 mL/min — ABNORMAL LOW (ref >90)
GFR calc non Af Amer: 63 mL/min — ABNORMAL LOW (ref >90)
Glucose, Bld: 104 mg/dL — ABNORMAL HIGH (ref 70–99)
Potassium: 4.3 mmol/L (ref 3.5–5.1)
Sodium: 138 mmol/L (ref 135–145)

## 2014-01-10 LAB — TROPONIN I

## 2014-01-10 LAB — I-STAT TROPONIN, ED: Troponin i, poc: 0 ng/mL (ref 0.00–0.08)

## 2014-01-10 MED ORDER — SODIUM CHLORIDE 0.9 % IV BOLUS (SEPSIS)
500.0000 mL | Freq: Once | INTRAVENOUS | Status: AC
  Administered 2014-01-10: 500 mL via INTRAVENOUS

## 2014-01-10 MED ORDER — ONDANSETRON HCL 4 MG/2ML IJ SOLN
4.0000 mg | Freq: Once | INTRAMUSCULAR | Status: AC
  Administered 2014-01-10: 4 mg via INTRAVENOUS
  Filled 2014-01-10: qty 2

## 2014-01-10 MED ORDER — FENTANYL CITRATE 0.05 MG/ML IJ SOLN
50.0000 ug | Freq: Once | INTRAMUSCULAR | Status: AC
  Administered 2014-01-10: 50 ug via INTRAVENOUS
  Filled 2014-01-10: qty 2

## 2014-01-10 MED ORDER — KETOROLAC TROMETHAMINE 30 MG/ML IJ SOLN
30.0000 mg | Freq: Once | INTRAMUSCULAR | Status: AC
  Administered 2014-01-10: 30 mg via INTRAVENOUS
  Filled 2014-01-10: qty 1

## 2014-01-10 MED ORDER — IBUPROFEN 600 MG PO TABS
600.0000 mg | ORAL_TABLET | Freq: Three times a day (TID) | ORAL | Status: AC
Start: 2014-01-10 — End: 2014-01-13

## 2014-01-10 MED ORDER — HYDROCODONE-ACETAMINOPHEN 5-325 MG PO TABS: 1.0000 | ORAL_TABLET | Freq: Four times a day (QID) | ORAL | Status: DC | PRN

## 2014-01-10 NOTE — ED Notes (Signed)
Dr.Lockwood at bedside  

## 2014-01-10 NOTE — Discharge Instructions (Signed)
As discussed, your evaluation today has been largely reassuring.  But, it is important that you monitor your condition carefully, and do not hesitate to return to the ED if you develop new, or concerning changes in your condition.  Your pain is likely coming from inflammation of the chest wall.  (A less likely possibility is Takatsubo Cardiomyopathy)  Please follow-up with your physician for appropriate ongoing care.    Chest Pain (Nonspecific) It is often hard to give a specific diagnosis for the cause of chest pain. There is always a chance that your pain could be related to something serious, such as a heart attack or a blood clot in the lungs. You need to follow up with your health care provider for further evaluation. CAUSES   Heartburn.  Pneumonia or bronchitis.  Anxiety or stress.  Inflammation around your heart (pericarditis) or lung (pleuritis or pleurisy).  A blood clot in the lung.  A collapsed lung (pneumothorax). It can develop suddenly on its own (spontaneous pneumothorax) or from trauma to the chest.  Shingles infection (herpes zoster virus). The chest wall is composed of bones, muscles, and cartilage. Any of these can be the source of the pain.  The bones can be bruised by injury.  The muscles or cartilage can be strained by coughing or overwork.  The cartilage can be affected by inflammation and become sore (costochondritis). DIAGNOSIS  Lab tests or other studies may be needed to find the cause of your pain. Your health care provider may have you take a test called an ambulatory electrocardiogram (ECG). An ECG records your heartbeat patterns over a 24-hour period. You may also have other tests, such as:  Transthoracic echocardiogram (TTE). During echocardiography, sound waves are used to evaluate how blood flows through your heart.  Transesophageal echocardiogram (TEE).  Cardiac monitoring. This allows your health care provider to monitor your heart rate and  rhythm in real time.  Holter monitor. This is a portable device that records your heartbeat and can help diagnose heart arrhythmias. It allows your health care provider to track your heart activity for several days, if needed.  Stress tests by exercise or by giving medicine that makes the heart beat faster. TREATMENT   Treatment depends on what may be causing your chest pain. Treatment may include:  Acid blockers for heartburn.  Anti-inflammatory medicine.  Pain medicine for inflammatory conditions.  Antibiotics if an infection is present.  You may be advised to change lifestyle habits. This includes stopping smoking and avoiding alcohol, caffeine, and chocolate.  You may be advised to keep your head raised (elevated) when sleeping. This reduces the chance of acid going backward from your stomach into your esophagus. Most of the time, nonspecific chest pain will improve within 2-3 days with rest and mild pain medicine.  HOME CARE INSTRUCTIONS   If antibiotics were prescribed, take them as directed. Finish them even if you start to feel better.  For the next few days, avoid physical activities that bring on chest pain. Continue physical activities as directed.  Do not use any tobacco products, including cigarettes, chewing tobacco, or electronic cigarettes.  Avoid drinking alcohol.  Only take medicine as directed by your health care provider.  Follow your health care provider's suggestions for further testing if your chest pain does not go away.  Keep any follow-up appointments you made. If you do not go to an appointment, you could develop lasting (chronic) problems with pain. If there is any problem keeping an appointment, call  to reschedule. SEEK MEDICAL CARE IF:   Your chest pain does not go away, even after treatment.  You have a rash with blisters on your chest.  You have a fever. SEEK IMMEDIATE MEDICAL CARE IF:   You have increased chest pain or pain that spreads to  your arm, neck, jaw, back, or abdomen.  You have shortness of breath.  You have an increasing cough, or you cough up blood.  You have severe back or abdominal pain.  You feel nauseous or vomit.  You have severe weakness.  You faint.  You have chills. This is an emergency. Do not wait to see if the pain will go away. Get medical help at once. Call your local emergency services (911 in [place] Do not drive yourself to the hospital. MAKE SURE YOU:   Understand these instructions.  Will watch your condition.  Will get help right away if you are not doing well or get worse. Document Released: 10/15/2004 Document Revised: 01/10/2013 Document Reviewed: 08/11/2007 [facility] Patient Information 2015 [place], [place]. This information is not intended to replace advice given to you by your health care provider. Make sure you discuss any questions you have with your health care provider.

## 2014-01-10 NOTE — ED Provider Notes (Signed)
CSN: [id number]     Arrival date & time 01/10/14  1719 History   First MD Initiated Contact with Patient 01/10/14 1903     Chief Complaint  Patient presents with  . Chest Pain    HPI  Patient p/w ongoing chest "heaviness."  This began about ten days ago, on the day of her sister committing suicide.  Since that time the patient has had persistent chest pressure, though it has become worse over the past 3 days. She has associated nausea, and one day had multiple episodes of emesis. No vomiting today. Patient notes that she had substantial grief in the days following the suicide, with Wailing, crying, and inability to control her emotions for some time. She denies a Hx of CAD, but does note a Hx of L ICA stenosis, last imaged one year ago. She denies HA.  Past Medical History  Diagnosis Date  . PVC's (premature ventricular contractions)   . Dyslipidemia   . Mood disorder     BPD  . Hypothyroidism   . Carotid artery disease    Past Surgical History  Procedure Laterality Date  . Transthoracic echocardiogram  12/2011    EF 78-29%, grade 1 diastolic dysfunction; trivial AV regurg; calcified MV annulus  . Carotid doppler  12/2011    0-49% RICA stenosis, 56-21% LICA stenosis  . Breast biopsy     Family History  Problem Relation Age of Onset  . Heart failure Mother   . Arthritis/Rheumatoid Father   . Heart Problems Maternal Grandmother   . Heart disease Brother     valve replacement  . Heart disease Child    History  Substance Use Topics  . Smoking status: Former Smoker -- 10 years    Types: Cigarettes    Quit date: 12/19/1972  . Smokeless tobacco: Never Used  . Alcohol Use: No     Comment: quit in 1973   OB History    No data available     Review of Systems  Constitutional:       Per HPI, otherwise negative  HENT:       Per HPI, otherwise negative  Respiratory:       Per HPI, otherwise negative  Cardiovascular:       Per HPI, otherwise negative   Gastrointestinal: Negative for vomiting.  Endocrine:       Negative aside from HPI  Genitourinary:       Neg aside from HPI   Musculoskeletal:       Per HPI, otherwise negative  Skin: Negative.   Neurological: Negative for syncope.      Allergies  Codeine  Home Medications   Prior to Admission medications   Medication Sig Start Date End Date Taking? Authorizing Provider  aspirin 81 MG tablet Take 81 mg by mouth daily.    Historical Provider, MD  clonazePAM (KLONOPIN) 0.5 MG tablet Take 0.5 mg by mouth 3 (three) times daily as needed for anxiety.    Historical Provider, MD  ezetimibe (ZETIA) 10 MG tablet Take 1 tablet (10 mg total) by mouth daily. 12/30/12   Pixie Casino, MD  FLUoxetine (PROZAC) 20 MG tablet Take 60 mg by mouth daily.    Historical Provider, MD  levothyroxine (SYNTHROID, LEVOTHROID) 88 MCG tablet Take 88 mcg by mouth daily before breakfast.    Historical Provider, MD  Multiple Vitamin (MULTIVITAMIN) capsule Take 1 capsule by mouth daily.    Historical Provider, MD  QUEtiapine (SEROQUEL) 300 MG tablet Take 300 mg  by mouth at bedtime.    Historical Provider, MD  rosuvastatin (CRESTOR) 20 MG tablet Take 2 tablets (40 mg total) by mouth daily. 12/30/12   Pixie Casino, MD  senna (SENOKOT) 8.6 MG TABS tablet Take 1 tablet by mouth at bedtime.    Historical Provider, MD   BP 137/83 mmHg  Pulse 71  Temp(Src) 97.9 F (36.6 C) (Oral)  Resp 15  Wt 190 lb (86.183 kg)  SpO2 100% Physical Exam  Constitutional: She is oriented to person, place, and time. She appears well-developed and well-nourished. No distress.  HENT:  Head: Normocephalic and atraumatic.  Eyes: Conjunctivae and EOM are normal.  Cardiovascular: Normal rate and regular rhythm.   Pulmonary/Chest: Effort normal and breath sounds normal. No stridor. No respiratory distress.  Abdominal: She exhibits no distension.  Musculoskeletal: She exhibits no edema.  Neurological: She is alert and oriented to  person, place, and time. No cranial nerve deficit.  Skin: Skin is warm and dry.  Psychiatric: She has a normal mood and affect.  Nursing note and vitals reviewed.   ED Course  Procedures (including critical care time) Labs Review Labs Reviewed  BASIC METABOLIC PANEL - Abnormal; Notable for the following:    Glucose, Bld 104 (*)    GFR calc non Af Amer 63 (*)    GFR calc Af Amer 73 (*)    All other components within normal limits  CBC  I-STAT TROPOININ, ED    Imaging Review Dg Chest 2 View  01/10/2014   CLINICAL DATA:  Chest pressure, pain, vomiting  EXAM: CHEST  2 VIEW  COMPARISON:  08/22/2007  FINDINGS: There is no focal parenchymal opacity, pleural effusion, or pneumothorax. The heart and mediastinal contours are unremarkable.  The osseous structures are unremarkable.  IMPRESSION: No active cardiopulmonary disease.   Electronically Signed   By: Kathreen Devoid   On: 01/10/2014 18:47     EKG Interpretation   Date/Time:  Wednesday January 10 2014 17:27:14 EST Ventricular Rate:  85 PR Interval:  168 QRS Duration: 72 QT Interval:  390 QTC Calculation: 464 R Axis:   41 Text Interpretation:  Normal sinus rhythm Normal ECG Sinus rhythm Artifact  Normal ECG Confirmed by Carmin Muskrat  MD [phone]) on 01/10/2014 7:04:07  PM     Update: On repeat exam the patient is in no distress, has no symptoms. This was the second repeat evaluation, and on the first when she had some mild pain. She and I had a lengthy conversation about all results, including serial negative troponins, nonischemic EKG, labs. With resolution of symptoms following Toradol, pain seems likely musculoskeletal, rather than cardiomyopathic. This is also consistent with lab results. Patient has a cardiologist and she'll follow-up via telephone tomorrow.   MDM   Final diagnoses:  Chest pain    This patient presents with ongoing chest pressure diffusely across the thorax. Patient does not have specific  pain. Patient has a history of carotid disease, but no documented CAD. The chronicity of her pain suggests inflammation, possibly secondary to prior substantial grieving with subsequent chest wall inflammation. Patient's labs do not suggest ongoing coronary ischemia. Packets of his cardiomyopathy is a possibility, but the patient's resolution of symptoms, as well as the absence of specific chest pain, or any respiratory complaints makes this less likely. Patient was discharged in stable condition to follow-up with her cardiologist tomorrow.    Carmin Muskrat, MD 01/10/14 [phone]

## 2014-01-10 NOTE — ED Notes (Signed)
Pt reports the chest heaviness has lessen; still reports nausea

## 2014-01-10 NOTE — ED Notes (Signed)
Pt c/o chest heaviness since the death of her sister. Pt was in [place] for a family function when she received the news of her sister's suicide. Pt reports heaviness and pressure in chest since then. Recently shortness of breath and nausea/vomiting have occurred with chest pressure. Pt states "I think it's all grief from what happened. I've wailed and wailed."

## 2014-01-10 NOTE — ED Notes (Signed)
Pt in c/o chest pain described as a "weight" on her chest, reports n/v and shortness of breath also- pt states she thinks this is due to anxiety/grief. Pt states her sister committed suicide a few days ago and these symptoms started after the funeral. Alert and oriented, no distress noted.

## 2014-01-15 ENCOUNTER — Telehealth: Payer: Self-pay | Admitting: Internal Medicine

## 2014-01-16 NOTE — Telephone Encounter (Signed)
Close encounter 

## 2014-01-24 ENCOUNTER — Other Ambulatory Visit: Payer: Self-pay | Admitting: Internal Medicine

## 2014-01-24 NOTE — Telephone Encounter (Signed)
Rx(s) sent to pharmacy electronically.  

## 2014-02-02 ENCOUNTER — Other Ambulatory Visit: Payer: Self-pay | Admitting: Internal Medicine

## 2014-02-16 ENCOUNTER — Ambulatory Visit: Payer: Medicare Other | Admitting: Internal Medicine

## 2014-03-05 ENCOUNTER — Ambulatory Visit: Payer: Self-pay | Admitting: Internal Medicine

## 2014-03-05 ENCOUNTER — Other Ambulatory Visit: Payer: Self-pay | Admitting: Internal Medicine

## 2014-03-06 ENCOUNTER — Other Ambulatory Visit: Payer: Self-pay | Admitting: Internal Medicine

## 2014-03-08 ENCOUNTER — Ambulatory Visit (INDEPENDENT_AMBULATORY_CARE_PROVIDER_SITE_OTHER): Payer: Medicare Other | Admitting: Internal Medicine

## 2014-03-08 ENCOUNTER — Encounter: Payer: Self-pay | Admitting: Internal Medicine

## 2014-03-08 VITALS — BP 120/78 | HR 91 | Ht 67.0 in | Wt 197.6 lb

## 2014-03-08 DIAGNOSIS — I779 Disorder of arteries and arterioles, unspecified: Secondary | ICD-10-CM

## 2014-03-08 DIAGNOSIS — E780 Pure hypercholesterolemia: Secondary | ICD-10-CM

## 2014-03-08 DIAGNOSIS — E785 Hyperlipidemia, unspecified: Secondary | ICD-10-CM

## 2014-03-08 DIAGNOSIS — I739 Peripheral vascular disease, unspecified: Principal | ICD-10-CM

## 2014-03-08 DIAGNOSIS — E7801 Familial hypercholesterolemia: Secondary | ICD-10-CM | POA: Insufficient documentation

## 2014-03-08 DIAGNOSIS — I493 Ventricular premature depolarization: Secondary | ICD-10-CM

## 2014-03-08 MED ORDER — ROSUVASTATIN CALCIUM 40 MG PO TABS: 40.0000 mg | ORAL_TABLET | Freq: Every day | ORAL | Status: DC

## 2014-03-08 MED ORDER — EZETIMIBE 10 MG PO TABS: 10.0000 mg | ORAL_TABLET | Freq: Every day | ORAL | Status: DC

## 2014-03-08 NOTE — Progress Notes (Signed)
OFFICE NOTE  Chief Complaint:  No complaints  Primary Care Physician: Antony Blackbird, MD  HPI:  Shari Horn is a 74 year old female who was referred to me for irregular heartbeat and PVCs, as well as an abnormal screening carotid ultrasound. We repeated that carotid Doppler which indicates increased velocity on the left internal carotid up to 206 cm/second in the posterior part of the internal carotid artery consistent with 50-69% left internal carotid artery stenosis. There was a mild amount of plaque in the right carotid, 0-49% stenosis, neither of which meets criteria for intervention at this time. In addition we performed an echocardiogram in the office, indicating an EF of 19-37%, mild diastolic dysfunction. There was trivial mitral regurgitation and aortic regurgitation, but no significant stenosis. I reviewed the results with her today and feel like she is doing quite well but would recommend a repeat lipid profile and more aggressive lipid management as necessary. I have copied you on that laboratory work so that you may review it, and we will plan to see her back annually with repeat carotid Dopplers at that time. She, in fact, was due to go over repeat dopplers today, but has not had them performed.  She was having palpitations, PVCs which are not as worrisome given the fact that she has normal LV function and no signs or symptoms of ischemia. I recommended decreasing her caffeine intake which may be difficult to do as she is reportedly self-addicted, but she does drink about 1 small pot, or 4 cups of coffee every day and that certainly can contribute significantly to her PVCs. She now reports the palpitations have improved.  Finally, she recently had cholesterol testing through her primary care provider. Her total cholesterol was 233, triglycerides 183, HDL 41 LDL 156. Based on this for right torn was increased from 10/20 mg to 10/40 mg. Her goal LDL, however is less than 70, which means she  needs an additional 50% reduction in her cholesterol. I'm not sure that this is achievable with an increase in her simvastatin by 20 mg.  I saw Shari Horn back in the office today. She recently was seen in emergency department for chest discomfort which was after grieving for friend who had committed suicide. She feels that this was all related to the grieving and cardiovascular workup was negative. In November she had carotid Dopplers which show stable carotid stenoses. Her particle numbers remain elevated with LDL-P of 1795, despite being on Crestor 20 and Zetia 10 mg. I increased her Crestor to 40 mg daily. She's had very high LDLs in the past and significant coronary stenoses. 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  PMHx:  Past Medical History  Diagnosis Date  . PVC's (premature ventricular contractions)   . Dyslipidemia   . Mood disorder     BPD  . Hypothyroidism   . Carotid artery disease     Past Surgical History  Procedure Laterality Date  . Transthoracic echocardiogram  12/2011    EF 90-24%, grade 1 diastolic dysfunction; trivial AV regurg; calcified MV annulus  . Carotid doppler  12/2011    0-49% RICA stenosis, 09-73% LICA stenosis  . Breast biopsy      FAMHx:  Family History  Problem Relation Age of Onset  . Heart failure Mother   . Arthritis/Rheumatoid Father   . Heart Problems Maternal Grandmother   . Heart disease Brother     valve replacement  . Heart disease Child  SOCHx:   reports that she quit smoking about 41 years ago. Her smoking use included Cigarettes. She quit after 10 years of use. She has never used smokeless tobacco. She reports that she does not drink alcohol or use illicit drugs.  ALLERGIES:  Allergies  Allergen Reactions  . Codeine Nausea And Vomiting    ROS: A comprehensive review of systems was negative.  HOME MEDS: Current Outpatient Prescriptions  Medication Sig Dispense Refill  . aspirin 81 MG tablet Take  81 mg by mouth daily.    . clonazePAM (KLONOPIN) 0.5 MG tablet Take 0.5 mg by mouth 3 (three) times daily as needed for anxiety.    Marland Kitchen ezetimibe (ZETIA) 10 MG tablet Take 1 tablet (10 mg total) by mouth daily. 90 tablet 3  . FLUoxetine (PROZAC) 20 MG tablet Take 60 mg by mouth daily.    Marland Kitchen HYDROcodone-acetaminophen (NORCO/VICODIN) 5-325 MG per tablet Take 1 tablet by mouth every 6 (six) hours as needed for severe pain. 15 tablet 0  . levothyroxine (SYNTHROID, LEVOTHROID) 88 MCG tablet Take 88 mcg by mouth daily before breakfast.    . Multiple Vitamin (MULTIVITAMIN) capsule Take 1 capsule by mouth daily.    . Omega-3 Fatty Acids (FISH OIL PO) Take 2,000 mg by mouth daily.     . QUEtiapine (SEROQUEL) 300 MG tablet Take 300 mg by mouth at bedtime.    . rosuvastatin (CRESTOR) 40 MG tablet Take 1 tablet (40 mg total) by mouth daily. 90 tablet 3  . senna (SENOKOT) 8.6 MG TABS tablet Take 1 tablet by mouth at bedtime.     No current facility-administered medications for this visit.    LABS/IMAGING: No results found for this or any previous visit (from the past 48 hour(s)). No results found.  VITALS: BP 120/78 mmHg  Pulse 91  Ht 5\' 7"  (1.702 m)  Wt 197 lb 9.6 oz (89.631 kg)  BMI 30.94 kg/m2  EXAM: General appearance: alert and no distress Neck: supple, faint carotid bruit on the left Lungs: clear to auscultation bilaterally Heart: regular rate and rhythm, S1, S2 normal, no murmur, click, rub or gallop Abdomen: soft, non-tender; bowel sounds normal; no masses,  no organomegaly Extremities: extremities normal, atraumatic, no cyanosis or edema Pulses: 2+ and symmetric Skin: Skin color, texture, turgor normal. No rashes or lesions Neurologic: Grossly normal Psych: Mood, affect normal  EKG: Normal sinus rhythm at 91  ASSESSMENT: 1. Bilateral carotid artery disease 2. 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 Palpitations-resolved 4. PVCs-resolved  PLAN: 1.   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 She's now on high-dose Crestor 40 mg and Zetia 10 mg daily. She is due for recheck of her lipid profile will recheck a lipid NMR. Fortunately her carotid artery stenosis appears to be stable by ultrasound. Plan to see her back annually or sooner as necessary. Ultimately, she may be a candidate for the PCSK9 inhibitors as she is currently on max tolerated therapy.  Pixie Casino, MD, [facility] Attending Cardiologist [facility]  Devynn Scheff C 03/08/2014, 3:49 PM

## 2014-03-08 NOTE — Patient Instructions (Addendum)
Your physician wants you to follow-up in: 1 year with Dr. Debara Pickett. You will receive a reminder letter in the mail two months in advance. If you don't receive a letter, please call our office to schedule the follow-up appointment.  Your physician recommends that you return for lab work FASTING  Medication samples have been provided to the patient.  Drug name: Crestor 20 (take 2 daily)  Qty: 14  LOT: [id number]  Exp.Date: 10/2016 Drug name: Zetia 10    Qty: 28  LOT: [id number]  Exp. Date: 03/2016

## 2014-03-15 LAB — NMR LIPOPROFILE WITH LIPIDS
Cholesterol, Total: 128 mg/dL (ref 100–199)
HDL Particle Number: 30.3 umol/L — ABNORMAL LOW (ref >=30.5)
HDL SIZE: 8.9 nm — AB (ref >=9.2)
HDL-C: 33 mg/dL — AB (ref >39)
LDL (calc): 65 mg/dL (ref 0–99)
LDL Particle Number: 1005 nmol/L — ABNORMAL HIGH (ref <1000)
LDL Size: 21 nm (ref >=20.8)
LP-IR Score: 67 — ABNORMAL HIGH (ref <=45)
Large HDL-P: <1.3 umol/L — ABNORMAL LOW (ref >=4.8)
Large VLDL-P: 6.8 nmol/L — ABNORMAL HIGH (ref <=2.7)
Small LDL Particle Number: 368 nmol/L (ref <=527)
Triglycerides: 149 mg/dL (ref 0–149)
VLDL SIZE: 55.3 nm — AB (ref <=46.6)

## 2014-03-22 ENCOUNTER — Encounter: Payer: Self-pay | Admitting: *Deleted

## 2014-09-12 ENCOUNTER — Other Ambulatory Visit: Payer: Self-pay | Admitting: Family Medicine

## 2014-09-12 ENCOUNTER — Other Ambulatory Visit: Payer: Self-pay

## 2014-09-12 DIAGNOSIS — Z1231 Encounter for screening mammogram for malignant neoplasm of breast: Secondary | ICD-10-CM

## 2014-09-12 DIAGNOSIS — M8580 Other specified disorders of bone density and structure, unspecified site: Secondary | ICD-10-CM

## 2014-09-26 ENCOUNTER — Other Ambulatory Visit: Payer: Self-pay | Admitting: Family Medicine

## 2014-09-26 DIAGNOSIS — I6529 Occlusion and stenosis of unspecified carotid artery: Secondary | ICD-10-CM

## 2014-12-10 ENCOUNTER — Other Ambulatory Visit: Payer: Self-pay | Admitting: Internal Medicine

## 2014-12-10 DIAGNOSIS — I6523 Occlusion and stenosis of bilateral carotid arteries: Secondary | ICD-10-CM

## 2014-12-17 ENCOUNTER — Ambulatory Visit (HOSPITAL_COMMUNITY)
Admission: RE | Admit: 2014-12-17 | Discharge: 2014-12-17 | Disposition: A | Discharge status: Home / Self Care | Payer: Medicare Other | Source: Ambulatory Visit | Attending: Cardiology | Admitting: Cardiology

## 2014-12-17 DIAGNOSIS — I6523 Occlusion and stenosis of bilateral carotid arteries: Secondary | ICD-10-CM | POA: Insufficient documentation

## 2014-12-17 DIAGNOSIS — E785 Hyperlipidemia, unspecified: Secondary | ICD-10-CM | POA: Insufficient documentation

## 2014-12-17 DIAGNOSIS — R0989 Other specified symptoms and signs involving the circulatory and respiratory systems: Secondary | ICD-10-CM | POA: Diagnosis not present

## 2014-12-24 ENCOUNTER — Other Ambulatory Visit: Payer: Self-pay | Admitting: Internal Medicine

## 2014-12-24 NOTE — Telephone Encounter (Signed)
REFILL 

## 2015-02-26 ENCOUNTER — Other Ambulatory Visit: Payer: Self-pay | Admitting: Internal Medicine

## 2015-02-26 ENCOUNTER — Ambulatory Visit (INDEPENDENT_AMBULATORY_CARE_PROVIDER_SITE_OTHER): Payer: Medicare Other | Admitting: Internal Medicine

## 2015-02-26 ENCOUNTER — Encounter: Payer: Self-pay | Admitting: Internal Medicine

## 2015-02-26 VITALS — BP 104/66 | HR 78 | Ht 67.5 in | Wt 199.4 lb

## 2015-02-26 DIAGNOSIS — I779 Disorder of arteries and arterioles, unspecified: Secondary | ICD-10-CM

## 2015-02-26 DIAGNOSIS — E7801 Familial hypercholesterolemia: Secondary | ICD-10-CM

## 2015-02-26 DIAGNOSIS — E785 Hyperlipidemia, unspecified: Secondary | ICD-10-CM | POA: Diagnosis not present

## 2015-02-26 DIAGNOSIS — R002 Palpitations: Secondary | ICD-10-CM | POA: Diagnosis not present

## 2015-02-26 DIAGNOSIS — I739 Peripheral vascular disease, unspecified: Secondary | ICD-10-CM

## 2015-02-26 DIAGNOSIS — I493 Ventricular premature depolarization: Secondary | ICD-10-CM

## 2015-02-26 MED ORDER — ACETAZOLAMIDE 125 MG PO TABS: 125.0000 mg | ORAL_TABLET | Freq: Two times a day (BID) | ORAL | Status: DC

## 2015-02-26 MED ORDER — ACETAZOLAMIDE 125 MG PO TABS: ORAL_TABLET | ORAL | Status: DC

## 2015-02-26 NOTE — Patient Instructions (Signed)
Dr Hilty recommends that you schedule a follow-up appointment in 1 year. You will receive a reminder letter in the mail two months in advance. If you don't receive a letter, please call our office to schedule the follow-up appointment.  If you need a refill on your cardiac medications before your next appointment, please call your pharmacy. 

## 2015-02-26 NOTE — Progress Notes (Signed)
OFFICE NOTE  Chief Complaint:  No complaints  Primary Care Physician: Antony Blackbird, MD  HPI:  Shari Horn is a 75 year old female who was referred to me for irregular heartbeat and PVCs, as well as an abnormal screening carotid ultrasound. We repeated that carotid Doppler which indicates increased velocity on the left internal carotid up to 206 cm/second in the posterior part of the internal carotid artery consistent with 50-69% left internal carotid artery stenosis. There was a mild amount of plaque in the right carotid, 0-49% stenosis, neither of which meets criteria for intervention at this time. In addition we performed an echocardiogram in the office, indicating an EF of 123456, mild diastolic dysfunction. There was trivial mitral regurgitation and aortic regurgitation, but no significant stenosis. I reviewed the results with her today and feel like she is doing quite well but would recommend a repeat lipid profile and more aggressive lipid management as necessary. I have copied you on that laboratory work so that you may review it, and we will plan to see her back annually with repeat carotid Dopplers at that time. She, in fact, was due to go over repeat dopplers today, but has not had them performed.  She was having palpitations, PVCs which are not as worrisome given the fact that she has normal LV function and no signs or symptoms of ischemia. I recommended decreasing her caffeine intake which may be difficult to do as she is reportedly self-addicted, but she does drink about 1 small pot, or 4 cups of coffee every day and that certainly can contribute significantly to her PVCs. She now reports the palpitations have improved.  Finally, she recently had cholesterol testing through her primary care provider. Her total cholesterol was 233, triglycerides 183, HDL 41 LDL 156. Based on this for right torn was increased from 10/20 mg to 10/40 mg. Her goal LDL, however is less than 70, which means she  needs an additional 50% reduction in her cholesterol. I'm not sure that this is achievable with an increase in her simvastatin by 20 mg.  I saw Shari Horn back in the office today. She recently was seen in emergency department for chest discomfort which was after grieving for friend who had committed suicide. She feels that this was all related to the grieving and cardiovascular workup was negative. In November she had carotid Dopplers which show stable carotid stenoses. Her particle numbers remain elevated with LDL-P of 1795, despite being on Crestor 20 and Zetia 10 mg. I increased her Crestor to 40 mg daily. She's had very high LDLs in the past and significant coronary stenoses. 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  Shari Horn returns today for follow-up. Overall she feels well. At her last cholesterol check about a year ago she had excellent control of her cholesterol with an LDL particle number just over thousand. She apparently had recheck by her primary care provider this fall. She also had repeat carotid Dopplers which shows stable moderate carotid stenoses. She denies any side effects from her cholesterol medicine. She tells me that she's planning a trip to the [place] and significant altitude and is interested in medication for that. I'm happy to provide her acetazolamide.  PMHx:  Past Medical History  Diagnosis Date  . PVC's (premature ventricular contractions)   . Dyslipidemia   . Mood disorder (HCC)     BPD  . Hypothyroidism   . Carotid artery disease [facility])     Past Surgical  History  Procedure Laterality Date  . Transthoracic echocardiogram  12/2011    EF 123456, grade 1 diastolic dysfunction; trivial AV regurg; calcified MV annulus  . Carotid doppler  12/2011    0-49% RICA stenosis, 0000000 LICA stenosis  . Breast biopsy      FAMHx:  Family History  Problem Relation Age of Onset  . Heart failure Mother   . Arthritis/Rheumatoid Father   . Heart  Problems Maternal Grandmother   . Heart disease Brother     valve replacement  . Heart disease Child     SOCHx:   reports that she quit smoking about 42 years ago. Her smoking use included Cigarettes. She quit after 10 years of use. She has never used smokeless tobacco. She reports that she does not drink alcohol or use illicit drugs.  ALLERGIES:  Allergies  Allergen Reactions  . Codeine Nausea And Vomiting    ROS: A comprehensive review of systems was negative.  HOME MEDS: Current Outpatient Prescriptions  Medication Sig Dispense Refill  . aspirin 81 MG tablet Take 81 mg by mouth daily.    . clonazePAM (KLONOPIN) 0.5 MG tablet Take 0.5 mg by mouth 3 (three) times daily as needed for anxiety.    Marland Kitchen FLUoxetine (PROZAC) 20 MG capsule Take 2 capsules by mouth daily. Take 3 tabs all together once daily  1  . FLUoxetine (PROZAC) 20 MG tablet Take 60 mg by mouth daily.    Marland Kitchen levothyroxine (SYNTHROID, LEVOTHROID) 88 MCG tablet Take 88 mcg by mouth daily before breakfast.    . Multiple Vitamin (MULTIVITAMIN) capsule Take 1 capsule by mouth daily.    . Omega-3 Fatty Acids (FISH OIL PO) Take 2,000 mg by mouth daily.     . QUEtiapine (SEROQUEL) 300 MG tablet Take 300 mg by mouth at bedtime.    . rosuvastatin (CRESTOR) 40 MG tablet TAKE 1 TABLET BY MOUTH EVERY DAY. 90 tablet 1  . senna (SENOKOT) 8.6 MG TABS tablet Take 1 tablet by mouth at bedtime.    Marland Kitchen ZETIA 10 MG tablet TAKE 1 TABLET BY MOUTH EVERY DAY. 90 tablet 1  . acetaZOLAMIDE (DIAMOX) 125 MG tablet Take 1 tablet by mouth twice daily for 2 days prior to ascending to altitude and up to 2 days after reaching altitude then discontinue. 20 tablet 0   No current facility-administered medications for this visit.    LABS/IMAGING: No results found for this or any previous visit (from the past 48 hour(s)). No results found.  VITALS: BP 104/66 mmHg  Pulse 78  Ht 5' 7.5" (1.715 m)  Wt 199 lb 6 oz (90.436 kg)  BMI 30.75  kg/m2  EXAM: General appearance: alert and no distress Neck: supple, faint carotid bruit on the left Lungs: clear to auscultation bilaterally Heart: regular rate and rhythm, S1, S2 normal, no murmur, click, rub or gallop Abdomen: soft, non-tender; bowel sounds normal; no masses,  no organomegaly Extremities: extremities normal, atraumatic, no cyanosis or edema Pulses: 2+ and symmetric Skin: Skin color, texture, turgor normal. No rashes or lesions Neurologic: Grossly normal Psych: Mood, affect normal  EKG: Normal sinus rhythm at 79  ASSESSMENT: 1. Bilateral carotid artery disease 2. 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 Palpitations-resolved 4. PVCs-resolved  PLAN: 1.   Shari Horn has a very high cholesterol with bilateral carotid artery disease. This is concerning for familial hypercholesterolemia. Fortunately the combination of Crestor and said he has lowered her cholesterol significantly. She recently had a recheck which  will obtain for her primary care provider. Her carotid Dopplers are stable. She denies any recurrent palpitations or PVCs. She's desiring medication to use as needed to prevent altitude sickness. She tells me that previously when she went to bed she had some high altitude cerebral edema. I'll plan to give her acetazolamide 125 mg twice a day for 2 days prior to ascending altitude and continued 2 days after reaching that altitude.  Follow-up annually.  Pixie Casino, MD, [facility] Attending Cardiologist [place] 02/26/2015, 2:36 PM

## 2015-05-07 ENCOUNTER — Other Ambulatory Visit: Payer: Self-pay | Admitting: Family Medicine

## 2015-05-07 DIAGNOSIS — R1013 Epigastric pain: Secondary | ICD-10-CM

## 2015-05-07 DIAGNOSIS — R1011 Right upper quadrant pain: Secondary | ICD-10-CM

## 2015-05-16 ENCOUNTER — Encounter: Payer: Self-pay | Admitting: Gastroenterology

## 2015-05-21 ENCOUNTER — Ambulatory Visit
Admission: RE | Admit: 2015-05-21 | Discharge: 2015-05-21 | Disposition: A | Discharge status: Home / Self Care | Payer: Medicare Other | Source: Ambulatory Visit | Attending: Family Medicine | Admitting: Family Medicine

## 2015-05-21 DIAGNOSIS — R1011 Right upper quadrant pain: Secondary | ICD-10-CM

## 2015-05-21 DIAGNOSIS — R1013 Epigastric pain: Secondary | ICD-10-CM

## 2015-06-21 ENCOUNTER — Encounter: Payer: Self-pay | Admitting: Family Medicine

## 2015-07-07 ENCOUNTER — Other Ambulatory Visit: Payer: Self-pay | Admitting: Internal Medicine

## 2015-07-08 NOTE — Telephone Encounter (Signed)
Rx(s) sent to pharmacy electronically.  

## 2015-12-03 ENCOUNTER — Other Ambulatory Visit: Payer: Self-pay | Admitting: Internal Medicine

## 2015-12-03 DIAGNOSIS — I6523 Occlusion and stenosis of bilateral carotid arteries: Secondary | ICD-10-CM

## 2015-12-24 ENCOUNTER — Inpatient Hospital Stay (HOSPITAL_COMMUNITY): Admission: RE | Admit: 2015-12-24 | Payer: Medicare Other | Source: Ambulatory Visit

## 2016-02-04 ENCOUNTER — Ambulatory Visit (HOSPITAL_COMMUNITY)
Admission: RE | Admit: 2016-02-04 | Discharge: 2016-02-04 | Disposition: A | Discharge status: Home / Self Care | Payer: Medicare Other | Source: Ambulatory Visit | Attending: Cardiology | Admitting: Cardiology

## 2016-02-04 DIAGNOSIS — I6523 Occlusion and stenosis of bilateral carotid arteries: Secondary | ICD-10-CM | POA: Diagnosis not present

## 2016-02-13 ENCOUNTER — Telehealth: Payer: Self-pay | Admitting: Internal Medicine

## 2016-02-13 DIAGNOSIS — I779 Disorder of arteries and arterioles, unspecified: Secondary | ICD-10-CM

## 2016-02-13 DIAGNOSIS — I739 Peripheral vascular disease, unspecified: Principal | ICD-10-CM

## 2016-02-13 MED ORDER — EZETIMIBE 10 MG PO TABS: 10.0000 mg | ORAL_TABLET | Freq: Every day | ORAL | 3 refills | Status: DC

## 2016-02-13 MED ORDER — ROSUVASTATIN CALCIUM 40 MG PO TABS: 40.0000 mg | ORAL_TABLET | Freq: Every day | ORAL | 3 refills | Status: DC

## 2016-02-13 NOTE — Telephone Encounter (Signed)
Called patient with carotid doppler results. Repeat doppler ordered.  Advised patient she is due for yearly visit Feb 2018. She did not want to schedule an appt as her doppler is stable  She requested refills of zetia and crestor Meds refilled and I requested patient have PCP forward her labs to our office for review  She states she is not having any BP issues or issues with PVCs/palpitations at this time  Recall updated for Feb 2019  Routed to MD as Juluis Rainier

## 2016-04-28 ENCOUNTER — Other Ambulatory Visit: Payer: Self-pay | Admitting: Internal Medicine

## 2016-04-28 NOTE — Telephone Encounter (Signed)
NEEDS OFFICE VISIT.

## 2016-05-18 ENCOUNTER — Other Ambulatory Visit: Payer: Self-pay | Admitting: Family

## 2016-05-18 ENCOUNTER — Ambulatory Visit
Admission: RE | Admit: 2016-05-18 | Discharge: 2016-05-18 | Disposition: A | Discharge status: Home / Self Care | Payer: Medicare Other | Source: Ambulatory Visit | Attending: Family | Admitting: Family

## 2016-05-18 DIAGNOSIS — Z1231 Encounter for screening mammogram for malignant neoplasm of breast: Secondary | ICD-10-CM

## 2016-05-27 ENCOUNTER — Encounter: Payer: Self-pay | Admitting: Neurology

## 2016-05-27 ENCOUNTER — Other Ambulatory Visit: Payer: Medicare Other

## 2016-05-27 ENCOUNTER — Ambulatory Visit (INDEPENDENT_AMBULATORY_CARE_PROVIDER_SITE_OTHER): Payer: Medicare Other | Admitting: Neurology

## 2016-05-27 VITALS — BP 142/76 | HR 74 | Ht 67.0 in | Wt 201.0 lb

## 2016-05-27 DIAGNOSIS — R42 Dizziness and giddiness: Secondary | ICD-10-CM | POA: Diagnosis not present

## 2016-05-27 DIAGNOSIS — R51 Headache: Secondary | ICD-10-CM | POA: Diagnosis not present

## 2016-05-27 DIAGNOSIS — R519 Headache, unspecified: Secondary | ICD-10-CM

## 2016-05-27 LAB — BUN/CREATININE RATIO
BUN / CREAT RATIO: 16.7 ratio (ref 6–22)
BUN: 14 mg/dL (ref 7–25)
Creat: 0.84 mg/dL (ref 0.60–0.93)
GFR, EST NON AFRICAN AMERICAN: 68 mL/min (ref >=60)
GFR, Est African American: 78 mL/min (ref >=60)

## 2016-05-27 MED ORDER — PREDNISONE 20 MG PO TABS: ORAL_TABLET | ORAL | 0 refills | Status: DC

## 2016-05-27 NOTE — Progress Notes (Signed)
NEUROLOGY FOLLOW UP OFFICE NOTE  Shari Horn [id number] 1940/09/09  HISTORY OF PRESENT ILLNESS: I had the pleasure of seeing Shari Horn in follow-up in the neurology clinic on 05/27/2016.  The patient was last seen in August 2015 for positional vertigo which improved after vestibular rehab. She would only have small bouts about twice a year. She presents today due to new symptoms with the vertigo that started 2 weeks ago. She has no prior history of headaches, but 2 weeks ago started having right temporoparietal headaches with the vertigo. It has been a constant 5 to 6 over 10 dull ache, today it is over the bilateral frontal regions. There is no associated nausea, vomiting, photo/phonophobia, no vision changes. She denies any focal numbness/tingling/weakness, diplopia, dysarthria/dysphagia, neck pain. No recent head injuries or infections. No prior history of migraines. She reports the vertigo is similar to her prior bouts, when she turns to one side on the bed it feels like her brain stays on the other side. She walks into door frames and while shopping yesterday she was walking like was drunk. Vertigo seems worse turning her head to the right side.   HPI 09/07/2013: This is a pleasant 76 yo RH woman with a history of hyperlipidemia, bilateral carotid artery disease, bipolar disorder, who presented with persistent dizziness that is provoked by head turning in bed.  She has a history of severe vertigo 8-10 years ago where she could not stand up and needed hospital admission. Around 2 months ago, she woke up with spinning sensation, nausea, and gait instability for 2 weeks. This improved, however she continues to have a sensation of floating and spinning when turning her head in bed, particularly to the right side. She notices a similar sensation when standing up and looking around.  She does not notice it when driving. The nausea has resolved. There is no associated headache, no focal  numbness/tingling/weakness, incoordination, diplopia, dysarthria, dysphagia. She occasionally hears a clicking sound in her right ear for the past 4-6 months and had been recently evaluated by ENT.  On review of most recent ENT visit notes from 08/24/13, patient was seen for dizziness, assessment was cerumen impaction, vertigo, hearing loss, and chronic otitis externa. She had reported fullness in ears, itching, and possible hearing loss.  Cerumen removal was done and patient scheduled for hearing test.    She denies any recent illnesses or head injuries.  She did have chills when the vertigo was severe, but no fever.  She denies any neck/back pain, bowel/bladder dysfunction.  She has a pressure in her temples today but states she does not normally get headaches.  She has been taking Seroquel for bipolar disorder for many years, and clonazepam twice a day for at least 10-12 years.  Her brother and sister have seizures, no family history of vertigo.  PAST MEDICAL HISTORY: Past Medical History:  Diagnosis Date  . Carotid artery disease ([place])   . Dyslipidemia   . Hypothyroidism   . 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 PVC's (premature ventricular contractions)     MEDICATIONS: Current Outpatient Prescriptions on File Prior to Visit  Medication Sig Dispense Refill  . acetaZOLAMIDE (DIAMOX) 125 MG tablet Take 1 tablet by mouth twice daily for 2 days prior to ascending to altitude and up to 2 days after reaching altitude then discontinue. 20 tablet 0  . aspirin 81 MG tablet Take 81 mg by mouth daily.    . clonazePAM (KLONOPIN) 0.5 MG tablet  Take 0.5 mg by mouth 3 (three) times daily as needed for anxiety.    Marland Kitchen ezetimibe (ZETIA) 10 MG tablet Take 1 tablet (10 mg total) by mouth daily. 90 tablet 3  . ezetimibe (ZETIA) 10 MG tablet Take 1 tablet (10 mg total) by mouth daily. NEEDS APPT FOR FUTURE REFILLS 90 tablet 0  . FLUoxetine (PROZAC) 20 MG capsule Take 2 capsules by mouth daily. Take 3 tabs all  together once daily  1  . FLUoxetine (PROZAC) 20 MG tablet Take 60 mg by mouth daily.    Marland Kitchen levothyroxine (SYNTHROID, LEVOTHROID) 88 MCG tablet Take 88 mcg by mouth daily before breakfast.    . Multiple Vitamin (MULTIVITAMIN) capsule Take 1 capsule by mouth daily.    . Omega-3 Fatty Acids (FISH OIL PO) Take 2,000 mg by mouth daily.     . QUEtiapine (SEROQUEL) 300 MG tablet Take 300 mg by mouth at bedtime.    . rosuvastatin (CRESTOR) 40 MG tablet Take 1 tablet (40 mg total) by mouth daily. 90 tablet 3  . rosuvastatin (CRESTOR) 40 MG tablet Take 1 tablet (40 mg total) by mouth daily. NEEDS APPT FOR FUTURE REFILLS 90 tablet 0  . senna (SENOKOT) 8.6 MG TABS tablet Take 1 tablet by mouth at bedtime.     No current facility-administered medications on file prior to visit.     ALLERGIES: Allergies  Allergen Reactions  . Codeine Nausea And Vomiting    FAMILY HISTORY: Family History  Problem Relation Age of Onset  . Heart failure Mother   . Arthritis/Rheumatoid Father   . Heart Problems Maternal Grandmother   . Heart disease Brother     valve replacement  . Heart disease Child     SOCIAL HISTORY: Social History   Social History  . Marital status: Divorced    Spouse name: N/A  . Number of children: 2  . Years of education: master's   Occupational History  . Not on file.   Social History Main Topics  . Smoking status: Former Smoker    Years: 10.00    Types: Cigarettes    Quit date: 12/19/1972  . Smokeless tobacco: Never Used  . Alcohol use No     Comment: quit in 1973  . Drug use: No  . Sexual activity: Not on file   Other Topics Concern  . Not on file   Social History Narrative  . No narrative on file    REVIEW OF SYSTEMS: Constitutional: No fevers, chills, or sweats, no generalized fatigue, change in appetite Eyes: No visual changes, double vision, eye pain Ear, nose and throat: No hearing loss, ear pain, nasal congestion, sore throat Cardiovascular: No chest  pain, palpitations Respiratory:  No shortness of breath at rest or with exertion, wheezes GastrointestinaI: No nausea, vomiting, diarrhea, abdominal pain, fecal incontinence Genitourinary:  No dysuria, urinary retention or frequency Musculoskeletal:  No neck pain, back pain Integumentary: No rash, pruritus, skin lesions Neurological: as above Psychiatric: No depression, insomnia, anxiety Endocrine: No palpitations, fatigue, diaphoresis, mood swings, change in appetite, change in weight, increased thirst Hematologic/Lymphatic:  No anemia, purpura, petechiae. Allergic/Immunologic: no itchy/runny eyes, nasal congestion, recent allergic reactions, rashes  PHYSICAL EXAM: Vitals:   05/27/16 0830  BP: (!) 142/76  Pulse: 74   General: No acute distress Head:  Normocephalic/atraumatic, no temporal or occipital tenderness, no temporal artery ropiness Neck: supple, no paraspinal tenderness, full range of motion Heart:  Regular rate and rhythm Lungs:  Clear to auscultation bilaterally Back: No  paraspinal tenderness Skin/Extremities: No rash, no edema Neurological Exam: alert and oriented to person, place, and time. No aphasia or dysarthria. Fund of knowledge is appropriate.  Recent and remote memory are intact.  Attention and concentration are normal.    Able to name objects and repeat phrases. Cranial nerves: Pupils equal, round, reactive to light.  Fundoscopic exam unremarkable, no papilledema. Extraocular movements intact with no nystagmus. Visual fields full. Facial sensation intact. No facial asymmetry. Tongue, uvula, palate midline.  Motor: Bulk and tone normal, muscle strength 5/5 throughout with no pronator drift.  Sensation to light touch, pin intact. Decreased cold on right foot.  No extinction to double simultaneous stimulation.  Deep tendon reflexes +1 throughout, toes downgoing.  Finger to nose testing intact.  Gait narrow-based and steady, difficulty with tandem walk.  Romberg  negative.  IMPRESSION: This is a pleasant 76 yo RH woman with a history of dyslipidemia, peripheral artery disease, bipolar disorder, previously seen for positional vertigo with head turn to right. She had a good response to vestibular rehab in 2015. She presents today with new symptoms of constant headache for the past 2 weeks with associated vertigo. She denies any prior history of headaches. Her neurological exam is normal. The etiology of her symptoms is unclear, with new onset headache at this age, ESR and CRP will be ordered to assess for temporal arteritis. MRI brain with and without contrast will be ordered to assess for underlying structural abnormality. She was given a week course of prednisone to hopefully break the headache cycle. She will try doing the home vestibular exercises, if not helpful for current vertigo, she will again be referred for vestibular rehab. She will follow-up in 3 months and knows to call for any changes.   Thank you for allowing me to participate in her care.  Please do not hesitate to call for any questions or concerns.  The duration of this appointment visit was 25 minutes of face-to-face time with the patient.  Greater than 50% of this time was spent in counseling, explanation of diagnosis, planning of further management, and coordination of care.   Ellouise Newer, M.D.   CC: Eloise Levels, NP

## 2016-05-27 NOTE — Patient Instructions (Signed)
1. Bloodwork for ESR, CRP, BUN, Creatinine 2. Schedule MRI brain with and without contrast 3. Start Prednisone '20mg'$ : Take 3 tabs on day 1, 2-1/2 tabs on day 2, 2 tabs on day 3, 1-1/2 tabs on day 4, 1 tab on day 5, 1/2 tab on days 6 and 7, then stop. #11 tabs no refills 4. Do the home vestibular exercise, if ineffective, call our office and we will get you scheduled for vestibular therapy 5. Our office will call you with results, follow-up in 3 months, call for any changes

## 2016-05-28 ENCOUNTER — Telehealth: Payer: Self-pay

## 2016-05-28 LAB — C-REACTIVE PROTEIN: CRP: <0.2 mg/L (ref <8.0)

## 2016-05-28 LAB — SEDIMENTATION RATE: SED RATE: 1 mm/h (ref 0–30)

## 2016-05-28 NOTE — Telephone Encounter (Signed)
-----   Message from Cameron Sprang, MD sent at 05/28/2016 11:30 AM EDT ----- Pls let her know bloodwork is normal, no inflammation. Thanks

## 2016-05-28 NOTE — Telephone Encounter (Signed)
Called pt and relayed message below.

## 2016-06-09 ENCOUNTER — Ambulatory Visit
Admission: RE | Admit: 2016-06-09 | Discharge: 2016-06-09 | Disposition: A | Discharge status: Home / Self Care | Payer: Medicare Other | Source: Ambulatory Visit | Attending: Neurology | Admitting: Neurology

## 2016-06-09 DIAGNOSIS — R51 Headache: Principal | ICD-10-CM

## 2016-06-09 DIAGNOSIS — R519 Headache, unspecified: Secondary | ICD-10-CM

## 2016-06-09 DIAGNOSIS — R42 Dizziness and giddiness: Secondary | ICD-10-CM

## 2016-06-09 MED ORDER — GADOBENATE DIMEGLUMINE 529 MG/ML IV SOLN
20.0000 mL | Freq: Once | INTRAVENOUS | Status: AC | PRN
  Administered 2016-06-09: 18 mL via INTRAVENOUS

## 2016-06-12 ENCOUNTER — Telehealth: Payer: Self-pay | Admitting: Neurology

## 2016-06-12 NOTE — Telephone Encounter (Signed)
PT called and wants to know if her MRI results are available yet/Dawn

## 2016-06-19 ENCOUNTER — Telehealth: Payer: Self-pay

## 2016-06-19 NOTE — Telephone Encounter (Signed)
-----   Message from Cameron Sprang, MD sent at 06/19/2016  6:10 AM EDT ----- Pls let her know brain MRI is normal, no evidence of tumor, stroke, or bleed. If she is still having a lot of vertigo, would proceed with Vestibular therapy. thanks

## 2016-06-19 NOTE — Telephone Encounter (Signed)
Called pt and relayed MRI results.  Pt states that she is still experiencing vertigo but admits that she has not been doing the home exercises that Dr. Delice Lesch suggested.  I asked if she would like to be referred for Vestibular therapy and she states that she wishes to start the home exercises first but will call our office for a referral if they do not pan out for her.

## 2016-07-24 ENCOUNTER — Other Ambulatory Visit: Payer: Self-pay | Admitting: Internal Medicine

## 2016-07-29 ENCOUNTER — Ambulatory Visit (INDEPENDENT_AMBULATORY_CARE_PROVIDER_SITE_OTHER): Payer: Medicare Other | Admitting: Endocrinology

## 2016-07-29 ENCOUNTER — Encounter: Payer: Self-pay | Admitting: Endocrinology

## 2016-07-29 VITALS — BP 130/88 | HR 79 | Ht 67.0 in | Wt 200.0 lb

## 2016-07-29 DIAGNOSIS — E063 Autoimmune thyroiditis: Secondary | ICD-10-CM

## 2016-07-29 DIAGNOSIS — R5383 Other fatigue: Secondary | ICD-10-CM

## 2016-07-29 LAB — T4, FREE: Free T4: 0.72 ng/dL (ref 0.60–1.60)

## 2016-07-29 LAB — TSH: TSH: 2.64 u[IU]/mL (ref 0.35–4.50)

## 2016-07-29 MED ORDER — LEVOTHYROXINE SODIUM 75 MCG PO TABS: 75.0000 ug | ORAL_TABLET | Freq: Every day | ORAL | 1 refills | Status: DC

## 2016-07-29 NOTE — Progress Notes (Signed)
Please call to let patient know that the lab results are normal and prescription has been sent for 75 g levothyroxine Follow-up in 4 months again with labs

## 2016-07-29 NOTE — Progress Notes (Addendum)
Patient ID: Shari Horn, female   DOB: 1940/05/07, 76 y.o.   MRN: [id number]           Referring Physician: Eloise Levels  Reason for Appointment:  Hypothyroidism, new visit    History of Present Illness:   Hypothyroidism was first diagnosed  when she was in her early 50s   At the time of diagnosis patient was apparently not having symptoms of  fatigue, cold sensitivity or weight gain She does not know if starting the supplement made her feel any better  The patient has been treated with  both brand name and Generic levothyroxine with doses ranging from 75 up to 137 g over the last several years  She has usually been seen by her PCP for dosage adjustments  The patient is that periodically she has marked fatigue which can last for a few weeks and does not remember how often this happens. More recently she has been feeling fairly good with her energy level Review of her labs over the last or years indicates that her TSH has been ranging from 0.09 up to about 8 However appears that she has been taking 88 g of levothyroxine consistently from review of her office records  In March 2018 her TSH was 0.3 and her dose was reduced down to 75 g She thinks she was feeling more tired that time but is feeling better now No problem with recent weight change, cold intolerance or change in bowel habits  She takes her medication when she gets up at night at various times, sometimes around 1 AM without any food She is very regular with taking her medication She does not take any special vitamins or iron supplements        Patient's weight history is as follows:  Wt Readings from Last 3 Encounters:  07/29/16 200 lb (90.7 kg)  05/27/16 201 lb (91.2 kg)  02/26/15 199 lb 6 oz (90.4 kg)    Thyroid function results have been as follows:  No results found for: TSH, FREET4, T3FREE   Past Medical History:  Diagnosis Date  . Carotid artery disease ([place])   . Dyslipidemia   . Hypothyroidism   .  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 PVC's (premature ventricular contractions)     Past Surgical History:  Procedure Laterality Date  . BREAST BIOPSY    . BREAST EXCISIONAL BIOPSY Left 1990  . BREAST EXCISIONAL BIOPSY Left 1996  . Carotid Doppler  12/2011   0-49% RICA stenosis, 96-22% LICA stenosis  . TRANSTHORACIC ECHOCARDIOGRAM  12/2011   EF 29-79%, grade 1 diastolic dysfunction; trivial AV regurg; calcified MV annulus    Family History  Problem Relation Age of Onset  . Heart failure Mother   . Arthritis/Rheumatoid Father   . Heart Problems Maternal Grandmother   . Heart disease Brother        valve replacement  . Heart disease Child     Social History:  reports that she quit smoking about 43 years ago. Her smoking use included Cigarettes. She quit after 10.00 years of use. She has never used smokeless tobacco. She reports that she does not drink alcohol or use drugs.  Allergies:  Allergies  Allergen Reactions  . Codeine Nausea And Vomiting    Allergies as of 07/29/2016      Reactions   Codeine Nausea And Vomiting      Medication List       Accurate as of 07/29/16  1:56 PM.  Always use your most recent med list.          aspirin 81 MG tablet Take 81 mg by mouth daily.   clonazePAM 0.5 MG tablet Commonly known as:  KLONOPIN Take 0.5 mg by mouth 3 (three) times daily as needed for anxiety.   ezetimibe 10 MG tablet Commonly known as:  ZETIA TAKE 1 TABLET(10 MG) BY MOUTH DAILY   FISH OIL PO Take 2,000 mg by mouth daily.   FLUoxetine 20 MG tablet Commonly known as:  PROZAC Take 60 mg by mouth daily.   levothyroxine 88 MCG tablet Commonly known as:  SYNTHROID, LEVOTHROID Take 75 mcg by mouth daily before breakfast.   multivitamin capsule Take 1 capsule by mouth daily.   QUEtiapine 300 MG tablet Commonly known as:  SEROQUEL Take 300 mg by mouth at bedtime.   rosuvastatin 40 MG tablet Commonly known as:  CRESTOR TAKE 1 TABLET(40 MG) BY MOUTH DAILY     senna 8.6 MG Tabs tablet Commonly known as:  SENOKOT Take 4 tablets by mouth at bedtime.         Review of Systems  Constitutional: Negative for weight loss and weight gain.       She does not exercise  HENT: Negative for trouble swallowing.   Respiratory: Negative for daytime sleepiness and shortness of breath.   Cardiovascular: Negative for palpitations.  Gastrointestinal:       Mild chronic constipation relieved by OTC medications  Endocrine: Positive for fatigue. Negative for cold intolerance.       See history of present illness  Musculoskeletal: Positive for muscle cramps.       Has cramps at times  Skin: Negative for dry skin.  Neurological: Negative for weakness, numbness and tingling.       Has some difficulty with memory  Psychiatric/Behavioral: Negative for insomnia.       Followed by psychiatrist for bipolar disorder long-term                Examination:    BP 130/88   Pulse 79   Ht 5\' 7"  (1.702 m)   Wt 200 lb (90.7 kg)   SpO2 93%   BMI 31.32 kg/m   GENERAL:  Large build.  She has generalized obesity  No pallor, clubbing, cervical lymphadenopathy or edema.  Skin:  no rash  EYES:  No prominence of the eyes or swelling of the eyelids  ENT: Oral mucosa and tongue normal.  THYROID:  Not palpable.  HEART:  Normal  S1 and S2; no murmur or click.  CHEST:    Lungs: Vescicular breath sounds heard equally.  No crepitations/ wheeze.  ABDOMEN:  Normal appearance.  Liver and spleen not palpable.   No other mass or tenderness.  NEUROLOGICAL: Reflexes are bilaterally normal at biceps, difficult to elicit at ankles.  JOINTS:  Normal peripheral joints.  Normal shape of spine   Assessment:  HYPOTHYROIDISM, long-standing and requiring relatively low doses of levothyroxine supplementation considering her weight Not clear what her baseline symptoms were, apparently diagnosed incidentally on labs She does not have a goiter in exam  Episodes of fatigue,  likely to be related to hypothyroidism as these are intermittent and fairly significant and do not appear to correlate with her thyroid levels  She is euthyroid on exam  Her thyroid levels over the last 4 years have been ranging from hypothyroid to hyperthyroid ranges but does not appear that her levothyroxine dose has been changed until 3 months ago when  her TSH was 0.3 Currently taking 75 mg levothyroxine and she is compliant with this on empty stomach during the night without any interacting substances  PLAN:   Check thyroid levels today and decide on dosage Discussed that she should try and get her pharmacist to give her the same manufacturer consistently If she has variable TSH levels will consider switching to brand name Discussed etiology and prognosis of hypothyroidism as well as timing of taking the thyroid medication and interacting substances  Also recommended that she discuss her episodes of fatigue both with her PCP and her psychiatrist, likely to be related to hypothyroidism  Also recommended that she start regular exercise for weight loss  Follow-up to be decided  Total visit time for evaluation and management of above issues and counseling = 45 minutes  Jarad Barth 07/29/2016, 1:56 PM   Consultation note copy sent to the PCP  Note: This office note was prepared with Dragon voice recognition system technology. Any transcriptional errors that result from this process are unintentional.  Addendum: TSH 2.6, she will continue 75 g levothyroxine and follow-up in 4 months

## 2016-07-29 NOTE — Addendum Note (Signed)
Addended by: Elayne Snare on: 07/29/2016 09:00 PM   Modules accepted: Orders

## 2016-08-20 ENCOUNTER — Ambulatory Visit (INDEPENDENT_AMBULATORY_CARE_PROVIDER_SITE_OTHER): Payer: Medicare Other | Admitting: Physician Assistant

## 2016-08-20 ENCOUNTER — Encounter: Payer: Self-pay | Admitting: Physician Assistant

## 2016-08-20 ENCOUNTER — Telehealth: Payer: Self-pay | Admitting: Internal Medicine

## 2016-08-20 VITALS — BP 120/76 | HR 85 | Ht 67.0 in | Wt 201.4 lb

## 2016-08-20 DIAGNOSIS — I779 Disorder of arteries and arterioles, unspecified: Secondary | ICD-10-CM

## 2016-08-20 DIAGNOSIS — M542 Cervicalgia: Secondary | ICD-10-CM | POA: Diagnosis not present

## 2016-08-20 DIAGNOSIS — I739 Peripheral vascular disease, unspecified: Secondary | ICD-10-CM

## 2016-08-20 DIAGNOSIS — Z79899 Other long term (current) drug therapy: Secondary | ICD-10-CM

## 2016-08-20 DIAGNOSIS — E785 Hyperlipidemia, unspecified: Secondary | ICD-10-CM

## 2016-08-20 DIAGNOSIS — R5383 Other fatigue: Secondary | ICD-10-CM | POA: Diagnosis not present

## 2016-08-20 DIAGNOSIS — E039 Hypothyroidism, unspecified: Secondary | ICD-10-CM | POA: Diagnosis not present

## 2016-08-20 LAB — CBC
Hematocrit: 39.5 % (ref 34.0–46.6)
Hemoglobin: 13.6 g/dL (ref 11.1–15.9)
MCH: 32.1 pg (ref 26.6–33.0)
MCHC: 34.4 g/dL (ref 31.5–35.7)
MCV: 93 fL (ref 79–97)
PLATELETS: 278 10*3/uL (ref 150–379)
RBC: 4.24 x10E6/uL (ref 3.77–5.28)
RDW: 13.5 % (ref 12.3–15.4)
WBC: 5.8 10*3/uL (ref 3.4–10.8)

## 2016-08-20 LAB — LIPID PANEL
Chol/HDL Ratio: 2.2 ratio (ref 0.0–4.4)
Cholesterol, Total: 92 mg/dL — ABNORMAL LOW (ref 100–199)
HDL: 41 mg/dL (ref >39)
LDL Calculated: 28 mg/dL (ref 0–99)
Triglycerides: 116 mg/dL (ref 0–149)
VLDL CHOLESTEROL CAL: 23 mg/dL (ref 5–40)

## 2016-08-20 LAB — COMPREHENSIVE METABOLIC PANEL
ALBUMIN: 4.6 g/dL (ref 3.5–4.8)
ALT: 22 IU/L (ref 0–32)
AST: 18 IU/L (ref 0–40)
Albumin/Globulin Ratio: 2.2 (ref 1.2–2.2)
Alkaline Phosphatase: 71 IU/L (ref 39–117)
BUN/Creatinine Ratio: 18 (ref 12–28)
BUN: 14 mg/dL (ref 8–27)
Bilirubin Total: 0.3 mg/dL (ref 0.0–1.2)
CO2: 23 mmol/L (ref 20–29)
CREATININE: 0.78 mg/dL (ref 0.57–1.00)
Calcium: 10.1 mg/dL (ref 8.7–10.3)
Chloride: 102 mmol/L (ref 96–106)
GFR calc Af Amer: 85 mL/min/{1.73_m2} (ref >59)
GFR, EST NON AFRICAN AMERICAN: 74 mL/min/{1.73_m2} (ref >59)
GLOBULIN, TOTAL: 2.1 g/dL (ref 1.5–4.5)
Glucose: 95 mg/dL (ref 65–99)
Potassium: 5.1 mmol/L (ref 3.5–5.2)
SODIUM: 141 mmol/L (ref 134–144)
TOTAL PROTEIN: 6.7 g/dL (ref 6.0–8.5)

## 2016-08-20 NOTE — Telephone Encounter (Signed)
Pt says she have some issues,she thinks she needs to be seen. First available appointment is 09-09-16.

## 2016-08-20 NOTE — Progress Notes (Signed)
Cardiology Office Note    Date:  08/21/2016   ID:  Shari Horn, DOB 12-Jun-1940, MRN [id number]  PCP:  Patient, No Pcp Per  Cardiologist:  Dr. Debara Pickett   Chief Complaint  Patient presents with  . Follow-up    seen for Dr. Debara Pickett, left neck pain    History of Present Illness:  Shari Horn is a 76 y.o. female with PMH of HLD, PVCs, hypothyroidism, and carotid artery disease.  She has known 40-59% left internal carotid artery stenosis and at 0-39% right carotid stenosis based on last carotid ultrasound 02/04/2016. Last echocardiogram obtained on 01/06/2012 showed EF 60-65%. Last lipid panel obtained in 2016 showed LDL 65, triglycerides were 149, HDL 33, total cholesterol 128. Her last office visit with Dr. Debara Pickett was on 02/26/2015 at which time she was doing well.  She was added on today for evaluation of left neck pain and worsening fatigue for the past several month. Her left neck pain is not worse with neck rotation. I have reviewed the previous CT of the neck obtained in 2009, it does show C4-C5 spondylosis. Her neck pain is not worse with palpation. When I asked her if her neck pain only occurs during exertion, she says she did have some neck pain at rest. I recommended GXT as my suspicion for ACS relatively low but she does have several risk factors including HLD, age, arterial atherosclerosis. I also recommended echocardiogram given her persistent fatigue. I did not appreciate significant cardiac murmur on physical exam. Suspicion for valvular issues relatively low in this case. She says she does snore quite a bit. I think most likely for her daytime somnolence and fatigue is related to obstructive sleep apnea. If her GXT and echocardiogram are normal, I would recommend outpatient sleep study to further evaluate. We will also obtain CBC and CMP, she is also due for FLP.    Past Medical History:  Diagnosis Date  . Carotid artery disease ([place])   . Dyslipidemia   . Hypothyroidism   . Mood disorder  (HCC)    BPD  . PVC's (premature ventricular contractions)     Past Surgical History:  Procedure Laterality Date  . BREAST BIOPSY    . BREAST EXCISIONAL BIOPSY Left 1990  . BREAST EXCISIONAL BIOPSY Left 1996  . Carotid Doppler  12/2011   0-49% RICA stenosis, 00-86% LICA stenosis  . TRANSTHORACIC ECHOCARDIOGRAM  12/2011   EF 76-19%, grade 1 diastolic dysfunction; trivial AV regurg; calcified MV annulus    Current Medications: Outpatient Medications Prior to Visit  Medication Sig Dispense Refill  . aspirin 81 MG tablet Take 81 mg by mouth daily.    . clonazePAM (KLONOPIN) 0.5 MG tablet Take 0.5 mg by mouth 3 (three) times daily as needed for anxiety.    Marland Kitchen ezetimibe (ZETIA) 10 MG tablet TAKE 1 TABLET(10 MG) BY MOUTH DAILY 30 tablet 5  . FLUoxetine (PROZAC) 20 MG tablet Take 60 mg by mouth daily.    Marland Kitchen levothyroxine (SYNTHROID, LEVOTHROID) 75 MCG tablet Take 1 tablet (75 mcg total) by mouth daily. 90 tablet 1  . Multiple Vitamin (MULTIVITAMIN) capsule Take 1 capsule by mouth daily.    . Omega-3 Fatty Acids (FISH OIL PO) Take 2,000 mg by mouth daily.     . QUEtiapine (SEROQUEL) 300 MG tablet Take 300 mg by mouth at bedtime.    . rosuvastatin (CRESTOR) 40 MG tablet TAKE 1 TABLET(40 MG) BY MOUTH DAILY 30 tablet 5  . senna (SENOKOT) 8.6  MG TABS tablet Take 4 tablets by mouth at bedtime.      No facility-administered medications prior to visit.      Allergies:   Codeine   Social History   Social History  . Marital status: Divorced    Spouse name: N/A  . Number of children: 2  . Years of education: master's   Social History Main Topics  . Smoking status: Former Smoker    Years: 10.00    Types: Cigarettes    Quit date: 12/19/1972  . Smokeless tobacco: Never Used  . Alcohol use No     Comment: quit in 1973  . Drug use: No  . Sexual activity: Not Asked   Other Topics Concern  . None   Social History Narrative  . None     Family History:  The patient's family history  includes Arthritis/Rheumatoid in her father; Heart Problems in her maternal grandmother; Heart disease in her brother and child; Heart failure in her mother; Thyroid disease in her mother.   ROS:   Please see the history of present illness.    ROS All other systems reviewed and are negative.   PHYSICAL EXAM:   VS:  BP 120/76   Pulse 85   Ht 5\' 7"  (1.702 m)   Wt 201 lb 6.4 oz (91.4 kg)   BMI 31.54 kg/m    GEN: Well nourished, well developed, in no acute distress  HEENT: normal  Neck: no JVD, carotid bruits, or masses Cardiac: RRR; no murmurs, rubs, or gallops,no edema  Respiratory:  clear to auscultation bilaterally, normal work of breathing GI: soft, nontender, nondistended, + BS MS: no deformity or atrophy  Skin: warm and dry, no rash Neuro:  Alert and Oriented x 3, Strength and sensation are intact Psych: euthymic mood, full affect  Wt Readings from Last 3 Encounters:  08/20/16 201 lb 6.4 oz (91.4 kg)  07/29/16 200 lb (90.7 kg)  05/27/16 201 lb (91.2 kg)      Studies/Labs Reviewed:   EKG:  EKG is ordered today.  The ekg ordered today demonstrates Normal sinus rhythm without significant ST-T wave changes  Recent Labs: 07/29/2016: TSH 2.64 08/20/2016: ALT 22; BUN 14; Creatinine, Ser 0.78; Hemoglobin 13.6; Platelets 278; Potassium 5.1; Sodium 141   Lipid Panel    Component Value Date/Time   CHOL 92 (L) 08/20/2016 1038   CHOL 128 03/13/2014 1148   TRIG 116 08/20/2016 1038   TRIG 149 03/13/2014 1148   HDL 41 08/20/2016 1038   HDL 33 (L) 03/13/2014 1148   CHOLHDL 2.2 08/20/2016 1038   LDLCALC 28 08/20/2016 1038   LDLCALC 65 03/13/2014 1148    Additional studies/ records that were reviewed today include:   Echo 01/06/2012 - Left ventricle: The cavity size was normal. Wall thickness was normal. Systolic function was normal. The estimated ejection fraction was in the range of 60% to 65%. Wall motion was normal; there were no regional wall  motion abnormalities. Doppler parameters are consistent with abnormal left ventricular relaxation (grade 1 diastolic dysfunction). The E/e' ratio is >10, suggesting elevated LV filling pressure. - Aortic valve: Trivial regurgitation. - Mitral valve: Calcified annulus. Mildly thickened leaflets . - Left atrium: LA Volume/ BSA = 23.66ml/m2 The atrium was normal in size. - Inferior vena cava: The vessel was normal in size; the respirophasic diameter changes were in the normal range (= 50%); findings are consistent with normal central venous pressure.   Echo 02/04/2016 Heterogeneous plaque, bilaterally. Essentially stable 1-39% RICA  stenosis. Stable 67-73% LICA stenosis (per peak sytolic velocity and ICA/CCA ratio). Normal subclavian arteries, bilaterally. Patent vertebral arteries with antegrade flow, bilaterally.  ASSESSMENT:    1. Neck pain   2. Other fatigue   3. Dyslipidemia   4. Medication management   5. Hypothyroidism, unspecified type   6. Bilateral carotid artery disease ([place])      PLAN:  In order of problems listed above:  1. Left neck pain: Not worse with rotation or palpation of the area. Occurs both with exertion and at rest, plan for outpatient GXT as my suspicion for ACS relatively low.  2. Severe fatigue: She has been having progressive fatigue recently. I will obtain a CBC and CMP to rule out secondary causes. If negative, she will have an echocardiogram. Most likely reason for her daytime fatigue despite prolonged nighttime sleep is obstructive sleep apnea. According to the patient, her grandkids have been telling her that she snores quite a bit. If echocardiogram is normal, I would recommend obtaining sleep study on follow-up.  3. Hyperlipidemia: On Crestor, she is due for a fasting lipid panel and LFT, will obtaining LFT as part of the CMP  4. Hypothyroidism: On Synthroid  5. Carotid artery disease: Last carotid ultrasound January 2018,  moderate disease on the left, mild disease on the right    Medication Adjustments/Labs and Tests Ordered: Current medicines are reviewed at length with the patient today.  Concerns regarding medicines are outlined above.  Medication changes, Labs and Tests ordered today are listed in the Patient Instructions below. Patient Instructions  Isaac Laud, [place] recommends that you have lab work TODAY -- CBC, CMET, lipid,   Your physician has requested that you have an echocardiogram @ [address] - [address]rd [address]. Echocardiography is a painless test that uses sound waves to create images of your heart. It provides your doctor with information about the size and shape of your heart and how well your heart's chambers and valves are working. This procedure takes approximately one hour. There are no restrictions for this procedure.  Your physician has requested that you have an exercise tolerance test @ [address]. For further information please visit [email or web address]. Please also follow instruction sheet, as given.  ** tests to be completed prior to appointment with Dr. Debara Pickett    Signed, Almyra Deforest, [place]  08/21/2016 10:38 PM    [address], [place], [place]  [address] Phone: [phone]; Fax: [phone]

## 2016-08-20 NOTE — Patient Instructions (Addendum)
Shari Horn, [place] recommends that you have lab work TODAY -- CBC, CMET, lipid,   Your physician has requested that you have an echocardiogram @ [address]. Echocardiography is a painless test that uses sound waves to create images of your heart. It provides your doctor with information about the size and shape of your heart and how well your heart's chambers and valves are working. This procedure takes approximately one hour. There are no restrictions for this procedure.  Your physician has requested that you have an exercise tolerance test @ [address]. For further information please visit [email or web address]. Please also follow instruction sheet, as given.  ** tests to be completed prior to appointment with Dr. Debara Pickett

## 2016-08-20 NOTE — Telephone Encounter (Signed)
Returned call to patient.She stated she has been having pain in left side of neck off and on for the past 2 months.Stated last night the pain was the worse.Stated she also for the past 1 month has been extremely tired.

## 2016-08-20 NOTE — Telephone Encounter (Signed)
Appointment scheduled with Almyra Deforest PA this morning at 9:00 am.

## 2016-08-21 ENCOUNTER — Encounter: Payer: Self-pay | Admitting: Physician Assistant

## 2016-08-21 NOTE — Progress Notes (Signed)
Cholesterol well controlled, liver enzyme seems to be normal on crestor, normal kidney function and electrolyte, normal red blood count. Did not find any electrolyte imbalance or anemia to explain her fatigue

## 2016-08-25 ENCOUNTER — Other Ambulatory Visit (HOSPITAL_COMMUNITY): Payer: Medicare Other

## 2016-08-26 ENCOUNTER — Ambulatory Visit: Payer: Medicare Other | Admitting: Neurology

## 2016-08-27 ENCOUNTER — Other Ambulatory Visit: Payer: Self-pay

## 2016-09-02 ENCOUNTER — Encounter (HOSPITAL_COMMUNITY): Payer: Medicare Other

## 2016-09-03 ENCOUNTER — Inpatient Hospital Stay (HOSPITAL_COMMUNITY): Admission: RE | Admit: 2016-09-03 | Payer: Medicare Other | Source: Ambulatory Visit

## 2016-09-03 ENCOUNTER — Ambulatory Visit (HOSPITAL_COMMUNITY)
Admission: RE | Admit: 2016-09-03 | Discharge: 2016-09-03 | Disposition: A | Discharge status: Home / Self Care | Payer: Medicare Other | Source: Ambulatory Visit | Attending: Cardiology | Admitting: Cardiology

## 2016-09-03 ENCOUNTER — Ambulatory Visit (HOSPITAL_BASED_OUTPATIENT_CLINIC_OR_DEPARTMENT_OTHER): Payer: Medicare Other

## 2016-09-03 ENCOUNTER — Other Ambulatory Visit: Payer: Self-pay

## 2016-09-03 DIAGNOSIS — I5030 Unspecified diastolic (congestive) heart failure: Secondary | ICD-10-CM | POA: Diagnosis not present

## 2016-09-03 DIAGNOSIS — I080 Rheumatic disorders of both mitral and aortic valves: Secondary | ICD-10-CM | POA: Diagnosis not present

## 2016-09-03 DIAGNOSIS — I6523 Occlusion and stenosis of bilateral carotid arteries: Secondary | ICD-10-CM | POA: Diagnosis not present

## 2016-09-03 DIAGNOSIS — I739 Peripheral vascular disease, unspecified: Secondary | ICD-10-CM

## 2016-09-03 DIAGNOSIS — M542 Cervicalgia: Secondary | ICD-10-CM

## 2016-09-03 DIAGNOSIS — I420 Dilated cardiomyopathy: Secondary | ICD-10-CM | POA: Diagnosis not present

## 2016-09-03 DIAGNOSIS — I779 Disorder of arteries and arterioles, unspecified: Secondary | ICD-10-CM

## 2016-09-04 ENCOUNTER — Telehealth (HOSPITAL_COMMUNITY): Payer: Self-pay | Admitting: Physician Assistant

## 2016-09-04 NOTE — Telephone Encounter (Signed)
Rescheduled: Change Notes: Canceled: 08/20/2016 11:28 AM 08/20/2016 3:14 PM 08/31/2016 9:56 AM By: By: By: Lucile Crater, Sharman Cheek, NORMA J  Cancel Rsn: Patient (does not want to have done)

## 2016-09-08 ENCOUNTER — Encounter: Payer: Self-pay | Admitting: *Deleted

## 2016-09-09 ENCOUNTER — Ambulatory Visit: Payer: Medicare Other | Admitting: Internal Medicine

## 2016-09-11 ENCOUNTER — Other Ambulatory Visit: Payer: Self-pay | Admitting: *Deleted

## 2016-09-11 DIAGNOSIS — I779 Disorder of arteries and arterioles, unspecified: Secondary | ICD-10-CM

## 2016-09-11 DIAGNOSIS — I739 Peripheral vascular disease, unspecified: Principal | ICD-10-CM

## 2016-09-14 ENCOUNTER — Telehealth: Payer: Self-pay | Admitting: Endocrinology

## 2016-09-14 ENCOUNTER — Ambulatory Visit (INDEPENDENT_AMBULATORY_CARE_PROVIDER_SITE_OTHER): Payer: Medicare Other | Admitting: Family Medicine

## 2016-09-14 ENCOUNTER — Encounter: Payer: Self-pay | Admitting: Family Medicine

## 2016-09-14 VITALS — BP 120/80 | HR 85 | Ht 67.0 in | Wt 205.0 lb

## 2016-09-14 DIAGNOSIS — I779 Disorder of arteries and arterioles, unspecified: Secondary | ICD-10-CM | POA: Diagnosis not present

## 2016-09-14 DIAGNOSIS — E039 Hypothyroidism, unspecified: Secondary | ICD-10-CM | POA: Insufficient documentation

## 2016-09-14 DIAGNOSIS — R1310 Dysphagia, unspecified: Secondary | ICD-10-CM | POA: Diagnosis not present

## 2016-09-14 DIAGNOSIS — F3181 Bipolar II disorder: Secondary | ICD-10-CM | POA: Insufficient documentation

## 2016-09-14 DIAGNOSIS — I739 Peripheral vascular disease, unspecified: Secondary | ICD-10-CM

## 2016-09-14 NOTE — Assessment & Plan Note (Signed)
On ASA 81mg  and crestor. Last LDL 28.

## 2016-09-14 NOTE — Progress Notes (Signed)
Subjective:  Shari Horn is a 76 y.o. female who presents today with a chief complaint of chest discomfort and also to establish care.   HPI:  Chest Discomfort Symptoms started 2-3 months ago. Pain only occurs after eating. Says that she feels like things are getting stuck after swallowing. Initially it only happened at night and after certain foods, though over the past few weeks, she has noticed the symptoms more frequently and now with liquids. She has tried tums which helps. No other treatments tried. She has had a few episodes of regurgitation.   No weight loss. No fevers or chills. No nausea or vomiting. No history of GERD or reflux.   Bipolar II Stable on current medications. Managed by psychiatry.  Hypothyroidism Stable on synthroid. Managed by endocrinology.  Carotid Stenosis / HLD Followed by cardiology. Stable. Has annual carotid ultrasounds. Currently on zetia, crestor, and ASA 81mg  daily.   ROS: Per HPI, otherwise all systems reviewed and are negative  PMH:  The following were reviewed and entered/updated in epic: Past Medical History:  Diagnosis Date  . Carotid artery disease ([place])   . Depression   . Dyslipidemia   . Hyperlipidemia   . Hypothyroidism   . 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 PVC's (premature ventricular contractions)    Patient Active Problem List   Diagnosis Date Noted  . Dysphagia 09/14/2016  . Bipolar 2 disorder ([place]) 09/14/2016  . Hypothyroidism 09/14/2016  . New onset of headaches after age 21 05/27/2016  . Positional vertigo 09/08/2013  . Bilateral carotid artery disease ([place]) 12/20/2012  . PVC's (premature ventricular contractions) 12/20/2012  . Dyslipidemia 12/20/2012   Past Surgical History:  Procedure Laterality Date  . BREAST BIOPSY    . BREAST EXCISIONAL BIOPSY Left 1990  . BREAST EXCISIONAL BIOPSY Left 1996  . Carotid Doppler  12/2011   0-49% RICA stenosis, 82-95% LICA stenosis  . TRANSTHORACIC ECHOCARDIOGRAM  12/2011   EF 62-13%, grade 1 diastolic dysfunction; trivial AV regurg; calcified MV annulus    Family History  Problem Relation Age of Onset  . Heart failure Mother   . Thyroid disease Mother   . Hypertension Mother   . Diabetes Mother   . Arthritis/Rheumatoid Father   . Heart Problems Maternal Grandmother   . Heart disease Brother        valve replacement  . Heart disease Child     Medications- reviewed and updated Current Outpatient Prescriptions  Medication Sig Dispense Refill  . aspirin 81 MG tablet Take 81 mg by mouth daily.    . clonazePAM (KLONOPIN) 0.5 MG tablet Take 0.5 mg by mouth 3 (three) times daily as needed for anxiety.    Marland Kitchen ezetimibe (ZETIA) 10 MG tablet TAKE 1 TABLET(10 MG) BY MOUTH DAILY 30 tablet 5  . FLUoxetine (PROZAC) 20 MG tablet Take 60 mg by mouth daily.    Marland Kitchen lamoTRIgine (LAMICTAL) 100 MG tablet Take 100 mg by mouth daily.    Marland Kitchen levothyroxine (SYNTHROID, LEVOTHROID) 75 MCG tablet Take 1 tablet (75 mcg total) by mouth daily. 90 tablet 1  . Multiple Vitamin (MULTIVITAMIN) capsule Take 1 capsule by mouth daily.    . Omega-3 Fatty Acids (FISH OIL PO) Take 2,000 mg by mouth daily.     . QUEtiapine (SEROQUEL) 400 MG tablet Take 400 mg by mouth at bedtime.     . rosuvastatin (CRESTOR) 40 MG tablet TAKE 1 TABLET(40 MG) BY MOUTH DAILY 30 tablet 5  . senna (  SENOKOT) 8.6 MG TABS tablet Take 4 tablets by mouth at bedtime.      No current facility-administered medications for this visit.     Allergies-reviewed and updated Allergies  Allergen Reactions  . Codeine Nausea And Vomiting and Nausea Only    Other reaction(s): Unknown    Social History   Social History  . Marital status: Divorced    Spouse name: N/A  . Number of children: 2  . Years of education: master's   Social History Main Topics  . Smoking status: Former Smoker    Years: 10.00    Types: Cigarettes    Quit date: 12/19/1972  . Smokeless tobacco: Never Used  . Alcohol use No     Comment: quit in 1973   . Drug use: No  . Sexual activity: Not Asked   Other Topics Concern  . None   Social History Narrative  . None   Objective:  Physical Exam: BP 120/80   Pulse 85   Ht 5\' 7"  (1.702 m)   Wt 205 lb (93 kg)   SpO2 96%   BMI 32.11 kg/m   Gen: NAD, resting comfortably CV: RRR with no murmurs appreciated Pulm: NWOB, CTAB with no crackles, wheezes, or rhonchi GI: Normal bowel sounds present. Soft, Nontender, Nondistended. MSK: no edema, cyanosis, or clubbing noted Skin: warm, dry Neuro: grossly normal, moves all extremities Psych: Normal affect and thought content  Assessment/Plan:  Dysphagia Unclear etiology. Possibly achalasia vs esophageal spasm, however given progression of symptoms over the past several weeks, will refer to GI for possible endoscopic evaluation.   Bipolar 2 disorder (HCC) Stable on klonopin, prozac, lamictal, and seroquel. Defer further management to psychiatry.   Hypothyroidism On synthroid 40mcg daily. Follow by endocrinology.   Bilateral carotid artery disease On ASA 81mg  and crestor. Last LDL 28.   Preventative Healthcare Due for AWV. Scheduled for 3 weeks.   Time Spent: I spent 45 minutes face-to-face with the patient, with more than half spent on counseling for the above issues and coordinating care.   Algis Greenhouse. Jerline Pain, MD 09/14/2016 1:55 PM

## 2016-09-14 NOTE — Patient Instructions (Signed)
Come back for your wellness visit.  I sent in a referral to GI, they will call you for an appointment.  Take care,  Dr Jerline Pain

## 2016-09-14 NOTE — Assessment & Plan Note (Signed)
Unclear etiology. Possibly achalasia vs esophageal spasm, however given progression of symptoms over the past several weeks, will refer to GI for possible endoscopic evaluation.

## 2016-09-14 NOTE — Assessment & Plan Note (Signed)
On synthroid 38mcg daily. Follow by endocrinology.

## 2016-09-14 NOTE — Assessment & Plan Note (Signed)
Stable on klonopin, prozac, lamictal, and seroquel. Defer further management to psychiatry.

## 2016-09-16 ENCOUNTER — Encounter: Payer: Self-pay | Admitting: Gastroenterology

## 2016-09-17 ENCOUNTER — Encounter: Payer: Self-pay | Admitting: Family Medicine

## 2016-09-17 DIAGNOSIS — M8580 Other specified disorders of bone density and structure, unspecified site: Secondary | ICD-10-CM | POA: Insufficient documentation

## 2016-10-01 ENCOUNTER — Ambulatory Visit: Payer: Medicare Other | Admitting: Gastroenterology

## 2016-10-06 ENCOUNTER — Ambulatory Visit: Payer: Medicare Other | Admitting: *Deleted

## 2016-10-12 ENCOUNTER — Ambulatory Visit (INDEPENDENT_AMBULATORY_CARE_PROVIDER_SITE_OTHER): Payer: Medicare Other | Admitting: Gastroenterology

## 2016-10-12 ENCOUNTER — Encounter: Payer: Self-pay | Admitting: Gastroenterology

## 2016-10-12 ENCOUNTER — Encounter (INDEPENDENT_AMBULATORY_CARE_PROVIDER_SITE_OTHER): Payer: Self-pay

## 2016-10-12 VITALS — BP 114/72 | HR 78 | Ht 67.0 in | Wt 204.0 lb

## 2016-10-12 DIAGNOSIS — Z1211 Encounter for screening for malignant neoplasm of colon: Secondary | ICD-10-CM

## 2016-10-12 DIAGNOSIS — K219 Gastro-esophageal reflux disease without esophagitis: Secondary | ICD-10-CM

## 2016-10-12 DIAGNOSIS — R1319 Other dysphagia: Secondary | ICD-10-CM | POA: Diagnosis not present

## 2016-10-12 MED ORDER — PANTOPRAZOLE SODIUM 40 MG PO TBEC: 40.0000 mg | DELAYED_RELEASE_TABLET | Freq: Every day | ORAL | 3 refills | Status: DC

## 2016-10-12 MED ORDER — NA SULFATE-K SULFATE-MG SULF 17.5-3.13-1.6 GM/177ML PO SOLN: ORAL | 0 refills | Status: DC

## 2016-10-12 NOTE — Patient Instructions (Signed)
We have sent the following medications to your pharmacy for you to pick up at your convenience: [facility].  1. Pantoprazole sodium 40 mg.   You have been scheduled for a colonoscopy and endoscopy. Please follow written instructions given to you at your visit today.  Please pick up your prep supplies at the pharmacy within the next 1-3 days. If you use inhalers (even only as needed), please bring them with you on the day of your procedure. Your physician has requested that you go to www.startemmi.com and enter the access code given to you at your visit today. This web site gives a general overview about your procedure. However, you should still follow specific instructions given to you by our office regarding your preparation for the procedure.

## 2016-10-12 NOTE — Progress Notes (Signed)
10/12/2016 Shari Horn [id number] Mar 31, 1940   HISTORY OF PRESENT ILLNESS:  This is a pleasant 76 year old female who is previously known to Dr. Olevia Perches for colonoscopy. She presents to our office today with complaints of reflux, intermittent vomiting, epigastric abdominal discomfort, and dysphagia for at least the past year. She says that when she eats dry foods they seem to not want to go down all the way and sometimes even then come back up. She does have very painful heartburn/indigestion at times, but does not take anything for reflux regularly. Uses Tums as needed, sometimes they work and sometimes they don't. She denies any issues with swallowing liquids. She denies any unintentional weight loss. She does take naproxen once, sometimes twice, daily for arthritic type pains.  Her last colonoscopy was in May 2007 by Dr. Olevia Perches at which time she was found have some polyps that were removed and were hyperplastic on pathology.  Denies any significant issues with moving her bowels. Has very minimal amounts of rectal bleeding that she contributes to hemorrhoids. Denies any family history of stomach, esophageal, colon cancer.    Past Medical History:  Diagnosis Date  . Carotid artery disease ([place])   . Depression   . Dyslipidemia   . Hyperlipidemia   . Hypothyroidism   . 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 PVC's (premature ventricular contractions)    Past Surgical History:  Procedure Laterality Date  . BREAST BIOPSY    . BREAST EXCISIONAL BIOPSY Left 1990  . BREAST EXCISIONAL BIOPSY Left 1996  . Carotid Doppler  12/2011   0-49% RICA stenosis, 54-00% LICA stenosis  . TRANSTHORACIC ECHOCARDIOGRAM  12/2011   EF 86-76%, grade 1 diastolic dysfunction; trivial AV regurg; calcified MV annulus    reports that she quit smoking about 43 years ago. Her smoking use included Cigarettes. She quit after 10.00 years of use. She has never used smokeless tobacco. She reports that she does not drink  alcohol or use drugs. family history includes Arthritis/Rheumatoid in her father; Diabetes in her mother; Heart Problems in her maternal grandmother; Heart disease in her brother and child; Heart failure in her mother; Hypertension in her mother; Thyroid disease in her mother. Allergies  Allergen Reactions  . Codeine Nausea And Vomiting and Nausea Only    Other reaction(s): Unknown      Outpatient Encounter Prescriptions as of 10/12/2016  Medication Sig  . aspirin 81 MG tablet Take 81 mg by mouth daily.  . clonazePAM (KLONOPIN) 0.5 MG tablet Take 0.5 mg by mouth 3 (three) times daily as needed for anxiety.  Marland Kitchen ezetimibe (ZETIA) 10 MG tablet TAKE 1 TABLET(10 MG) BY MOUTH DAILY  . FLUoxetine (PROZAC) 20 MG tablet Take 60 mg by mouth daily.  Marland Kitchen lamoTRIgine (LAMICTAL) 100 MG tablet Take 100 mg by mouth daily.  Marland Kitchen levothyroxine (SYNTHROID, LEVOTHROID) 75 MCG tablet Take 1 tablet (75 mcg total) by mouth daily.  . Multiple Vitamin (MULTIVITAMIN) capsule Take 1 capsule by mouth daily.  . Omega-3 Fatty Acids (FISH OIL PO) Take 2,000 mg by mouth daily.   . QUEtiapine (SEROQUEL) 400 MG tablet Take 400 mg by mouth at bedtime.   . rosuvastatin (CRESTOR) 40 MG tablet TAKE 1 TABLET(40 MG) BY MOUTH DAILY  . senna (SENOKOT) 8.6 MG TABS tablet Take 4 tablets by mouth at bedtime.    No facility-administered encounter medications on file as of 10/12/2016.      REVIEW OF SYSTEMS  : All other  systems reviewed and negative except where noted in the History of Present Illness.   PHYSICAL EXAM: BP 114/72   Pulse 78   Ht 5\' 7"  (1.702 m)   Wt 204 lb (92.5 kg)   BMI 31.95 kg/m  General: Well developed white female in no acute distress Head: Normocephalic and atraumatic Eyes:  Sclerae anicteric, conjunctiva pink. Ears: Normal auditory acuity Lungs: Clear throughout to auscultation; no increased WOB. Heart: Regular rate and rhythm; no M/R/G. Abdomen: Soft, non-distended.  BS present.  Mild epigastric  TTP. Rectal:  Will be done at the time of colonoscopy. Musculoskeletal: Symmetrical with no gross deformities  Skin: No lesions on visible extremities Extremities: No edema  Neurological: Alert oriented x 4, grossly non-focal Psychological:  Alert and cooperative. Normal mood and affect  ASSESSMENT AND PLAN: -Dysphagia with reflux, epigastric pain and occasional vomiting:  Possibly all reflux related, esophagitis.  ? Stricture.  Will schedule for EGD with Dr. [name].  Will place her on pantoprazole 40 mg daily. -Screening colonoscopy:  Last was in 2007 with hyperplastic polyps.  Will schedule this as well.  *The risks, benefits, and alternatives to colonoscopy and EGD with possible dilation were discussed with the patient and she consents to proceed.    CC:  Vivi Barrack, MD

## 2016-10-13 NOTE — Progress Notes (Signed)
Thank you for sending this case to me. I have reviewed the entire note, and the outlined plan seems appropriate.   Henry Danis, MD  

## 2016-10-16 NOTE — Telephone Encounter (Signed)
error 

## 2016-10-19 ENCOUNTER — Ambulatory Visit (INDEPENDENT_AMBULATORY_CARE_PROVIDER_SITE_OTHER): Payer: Medicare Other | Admitting: Orthopaedic Surgery

## 2016-10-19 ENCOUNTER — Ambulatory Visit (INDEPENDENT_AMBULATORY_CARE_PROVIDER_SITE_OTHER): Payer: Medicare Other

## 2016-10-19 DIAGNOSIS — M25562 Pain in left knee: Secondary | ICD-10-CM

## 2016-10-19 DIAGNOSIS — G8929 Other chronic pain: Secondary | ICD-10-CM

## 2016-10-19 MED ORDER — BUPIVACAINE HCL 0.5 % IJ SOLN
2.0000 mL | INTRAMUSCULAR | Status: AC | PRN
  Administered 2016-10-19: 2 mL via INTRA_ARTICULAR

## 2016-10-19 MED ORDER — METHYLPREDNISOLONE ACETATE 40 MG/ML IJ SUSP
40.0000 mg | INTRAMUSCULAR | Status: AC | PRN
  Administered 2016-10-19: 40 mg via INTRA_ARTICULAR

## 2016-10-19 MED ORDER — LIDOCAINE HCL 1 % IJ SOLN
2.0000 mL | INTRAMUSCULAR | Status: AC | PRN
  Administered 2016-10-19: 2 mL

## 2016-10-19 NOTE — Progress Notes (Signed)
Office Visit Note   Patient: Shari Horn           Date of Birth: [birth year]           MRN: [id number] Visit Date: 10/19/2016              Requested by: Vivi Barrack, MD [address] [place], [place] [address] PCP: Vivi Barrack, MD   Assessment & Plan: Visit Diagnoses:  1. 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 Cortisone injection was performed today. Questions encouraged and answered. Follow-up as needed.  Follow-Up Instructions: Return if symptoms worsen or fail to improve.   Orders:  Orders Placed This Encounter  Procedures  . XR KNEE 3 VIEW LEFT   No orders of the defined types were placed in this encounter.     Procedures: Large Joint Inj Date/Time: 10/19/2016 9:53 AM Performed by: Leandrew Koyanagi Authorized by: Leandrew Koyanagi   Consent Given by:  Patient Timeout: prior to procedure the correct patient, procedure, and site was verified   Indications:  Pain Location:  Knee Site:  L knee Prep: patient was prepped and draped in usual sterile fashion   Needle Size:  22 G Ultrasound Guidance: No   Fluoroscopic Guidance: No   Arthrogram: No   Medications:  2 mL lidocaine 1 %; 2 mL bupivacaine 0.5 %; 40 mg methylPREDNISolone acetate 40 MG/ML Patient tolerance:  Patient tolerated the procedure well with no immediate complications     Clinical Data: No additional findings.   Subjective: No chief complaint on file.   Patient is a 76 year old female who is had 6 months of progressively worsening left medial knee pain. It is worse with weightbearing activity. She denies any specific injuries. Sometimes she'll have some trouble extending her knee. Denies any true mechanical symptoms. Denies any numbness and tingling.    Review of Systems  Constitutional: Negative.   HENT: Negative.   Eyes: Negative.   Respiratory: Negative.   Cardiovascular: Negative.   Endocrine: Negative.   Musculoskeletal:  Negative.   Neurological: Negative.   Hematological: Negative.   Psychiatric/Behavioral: Negative.   All other systems reviewed and are negative.    Objective: Vital Signs: There were no vitals taken for this visit.  Physical Exam  Constitutional: She is oriented to person, place, and time. She appears well-developed and well-nourished.  HENT:  Head: Normocephalic and atraumatic.  Eyes: EOM are normal.  Neck: Neck supple.  Pulmonary/Chest: Effort normal.  Abdominal: Soft.  Neurological: She is alert and oriented to person, place, and time.  Skin: Skin is warm. Capillary refill takes less than 2 seconds.  Psychiatric: She has a normal mood and affect. Her behavior is normal. Judgment and thought content normal.  Nursing note and vitals reviewed.   Ortho Exam Left knee exam shows no joint effusion. Collaterals and cruciates are stable. Medial joint line tenderness. Negative [place]. Specialty Comments:  No specialty comments available.  Imaging: Xr Knee 3 View Left  Result Date: 10/19/2016 No significant degenerative joint disease    PMFS History: Patient Active Problem List   Diagnosis Date Noted  . Gastroesophageal reflux disease without esophagitis 10/12/2016  . Encounter for screening colonoscopy 10/12/2016  . Osteopenia 09/17/2016  . Dysphagia 09/14/2016  . Bipolar 2 disorder ([facility]) 09/14/2016  . Hypothyroidism 09/14/2016  . New onset of headaches after age 82 05/27/2016  . Positional vertigo 09/08/2013  . Bilateral carotid artery disease ([place])  12/20/2012  . PVC's (premature ventricular contractions) 12/20/2012  . Dyslipidemia 12/20/2012   Past Medical History:  Diagnosis Date  . Carotid artery disease ([place])   . Depression   . Dyslipidemia   . Hyperlipidemia   . Hypothyroidism   . 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 PVC's (premature ventricular contractions)     Family History  Problem Relation Age of Onset  . Heart failure Mother   . Thyroid disease  Mother   . Hypertension Mother   . Diabetes Mother   . Arthritis/Rheumatoid Father   . Heart Problems Maternal Grandmother   . Heart disease Brother        valve replacement  . Heart disease Child   . Colon cancer Neg Hx   . Esophageal cancer Neg Hx   . Stomach cancer Neg Hx     Past Surgical History:  Procedure Laterality Date  . BREAST BIOPSY    . BREAST EXCISIONAL BIOPSY Left 1990  . BREAST EXCISIONAL BIOPSY Left 1996  . Carotid Doppler  12/2011   0-49% RICA stenosis, 00-17% LICA stenosis  . TRANSTHORACIC ECHOCARDIOGRAM  12/2011   EF 49-44%, grade 1 diastolic dysfunction; trivial AV regurg; calcified MV annulus   Social History   Occupational History  . Not on file.   Social History Main Topics  . Smoking status: Former Smoker    Years: 10.00    Types: Cigarettes    Quit date: 12/19/1972  . Smokeless tobacco: Never Used  . Alcohol use No     Comment: quit in 1973  . Drug use: No  . Sexual activity: Not on file

## 2016-10-30 ENCOUNTER — Encounter: Payer: Self-pay | Admitting: Gastroenterology

## 2016-11-09 ENCOUNTER — Ambulatory Visit: Payer: Medicare Other | Admitting: Gastroenterology

## 2016-11-13 ENCOUNTER — Encounter: Payer: Self-pay | Admitting: Gastroenterology

## 2016-11-13 ENCOUNTER — Ambulatory Visit (AMBULATORY_SURGERY_CENTER): Payer: Medicare Other | Admitting: Gastroenterology

## 2016-11-13 VITALS — BP 134/79 | HR 66 | Temp 96.9°F | Resp 21 | Ht 67.0 in | Wt 204.0 lb

## 2016-11-13 DIAGNOSIS — D124 Benign neoplasm of descending colon: Secondary | ICD-10-CM | POA: Diagnosis not present

## 2016-11-13 DIAGNOSIS — K257 Chronic gastric ulcer without hemorrhage or perforation: Secondary | ICD-10-CM

## 2016-11-13 DIAGNOSIS — D123 Benign neoplasm of transverse colon: Secondary | ICD-10-CM | POA: Diagnosis not present

## 2016-11-13 DIAGNOSIS — K219 Gastro-esophageal reflux disease without esophagitis: Secondary | ICD-10-CM | POA: Diagnosis not present

## 2016-11-13 DIAGNOSIS — D122 Benign neoplasm of ascending colon: Secondary | ICD-10-CM | POA: Diagnosis not present

## 2016-11-13 DIAGNOSIS — Z1211 Encounter for screening for malignant neoplasm of colon: Secondary | ICD-10-CM | POA: Diagnosis present

## 2016-11-13 MED ORDER — SODIUM CHLORIDE 0.9 % IV SOLN: 500.0000 mL | INTRAVENOUS | Status: DC

## 2016-11-13 NOTE — Progress Notes (Signed)
To recovery, report to RN, VSS. 

## 2016-11-13 NOTE — Progress Notes (Signed)
Called to room to assist during endoscopic procedure.  Patient ID and intended procedure confirmed with present staff. Received instructions for my participation in the procedure from the performing physician.  

## 2016-11-13 NOTE — Patient Instructions (Signed)
** Handouts given on polyps and diverticulosis ** Start taking pantoprazole once daily.   AVOID NSAIDS FOR 4 WEEKS. TYLENOL IS OKAY TO TAKE.   YOU HAD AN ENDOSCOPIC PROCEDURE TODAY AT [facility]:   Refer to the procedure report that was given to you for any specific questions about what was found during the examination.  If the procedure report does not answer your questions, please call your gastroenterologist to clarify.  If you requested that your care partner not be given the details of your procedure findings, then the procedure report has been included in a sealed envelope for you to review at your convenience later.  YOU SHOULD EXPECT: Some feelings of bloating in the abdomen. Passage of more gas than usual.  Walking can help get rid of the air that was put into your GI tract during the procedure and reduce the bloating. If you had a lower endoscopy (such as a colonoscopy or flexible sigmoidoscopy) you may notice spotting of blood in your stool or on the toilet paper. If you underwent a bowel prep for your procedure, you may not have a normal bowel movement for a few days.  Please Note:  You might notice some irritation and congestion in your nose or some drainage.  This is from the oxygen used during your procedure.  There is no need for concern and it should clear up in a day or so.  SYMPTOMS TO REPORT IMMEDIATELY:   Following lower endoscopy (colonoscopy or flexible sigmoidoscopy):  Excessive amounts of blood in the stool  Significant tenderness or worsening of abdominal pains  Swelling of the abdomen that is new, acute  Fever of 100F or higher   Following upper endoscopy (EGD)  Vomiting of blood or coffee ground material  New chest pain or pain under the shoulder blades  Painful or persistently difficult swallowing  New shortness of breath  Fever of 100F or higher  Black, tarry-looking stools  For urgent or emergent issues, a gastroenterologist can be  reached at any hour by calling [phone].   DIET:  We do recommend a small meal at first, but then you may proceed to your regular diet.  Drink plenty of fluids but you should avoid alcoholic beverages for 24 hours.  ACTIVITY:  You should plan to take it easy for the rest of today and you should NOT DRIVE or use heavy machinery until tomorrow (because of the sedation medicines used during the test).    FOLLOW UP: Our staff will call the number listed on your records the next business day following your procedure to check on you and address any questions or concerns that you may have regarding the information given to you following your procedure. If we do not reach you, we will leave a message.  However, if you are feeling well and you are not experiencing any problems, there is no need to return our call.  We will assume that you have returned to your regular daily activities without incident.  If any biopsies were taken you will be contacted by phone or by letter within the next 1-3 weeks.  Please call us at [phone] if you have not heard about the biopsies in 3 weeks.    SIGNATURES/CONFIDENTIALITY: You and/or your care partner have signed paperwork which will be entered into your electronic medical record.  These signatures attest to the fact that that the information above on your After Visit Summary has been reviewed and is understood.  Full responsibility of the confidentiality of this discharge information lies with you and/or your care-partner.

## 2016-11-13 NOTE — Op Note (Signed)
[facility] Patient Name: Shari Horn Procedure Date: 11/13/2016 3:17 PM MRN: [id number] Endoscopist: Mallie Mussel L. [name] , MD Age: 76 Referring MD:  Date of Birth: 11/22/1940 Gender: Female Account #: [id number] Procedure:                Colonoscopy Indications:              Screening for colorectal malignant neoplasm (no                            adenomatous polyps on 2007 colonoscopy) Medicines:                Monitored Anesthesia Care Procedure:                Pre-Anesthesia Assessment:                           - Prior to the procedure, a History and Physical                            was performed, and patient medications and                            allergies were reviewed. The patient's tolerance of                            previous anesthesia was also reviewed. The risks                            and benefits of the procedure and the sedation                            options and risks were discussed with the patient.                            All questions were answered, and informed consent                            was obtained. Anticoagulants: The patient has taken                            aspirin. It was decided not to withhold this                            medication prior to the procedure. ASA Grade                            Assessment: II - A patient with mild systemic                            disease. After reviewing the risks and benefits,                            the patient was deemed in satisfactory condition to  undergo the procedure.                           After obtaining informed consent, the colonoscope                            was passed under direct vision. Throughout the                            procedure, the patient's blood pressure, pulse, and                            oxygen saturations were monitored continuously. The                            Model PCF-H190DL [id number]) scope was introduced                             through the anus and advanced to the the terminal                            ileum. The colonoscopy was performed without                            difficulty. The patient tolerated the procedure                            well. The quality of the bowel preparation was                            good. The terminal ileum, ileocecal valve,                            appendiceal orifice, and rectum were photographed.                            The quality of the bowel preparation was evaluated                            using the BBPS [facility] Bowel Preparation Scale)                            with scores of: Right Colon = 2, Transverse Colon =                            2 and Left Colon = 2. The total BBPS score equals                            6. After lavage. The bowel preparation used was                            SUPREP. Scope In: 3:30:54 PM Scope Out: 3:57:07 PM Scope Withdrawal Time: 0 hours 18 minutes 9 seconds  Total Procedure  Duration: 0 hours 26 minutes 13 seconds  Findings:                 The digital rectal exam findings include decreased                            sphincter tone.                           The terminal ileum appeared normal.                           Diverticula were found in the left colon.                           A 4 mm polyp was found in the descending colon. The                            polyp was flat. The polyp was removed with a                            piecemeal technique using a cold biopsy forceps.                            Resection and retrieval were complete.                           Three sessile polyps were found in the transverse                            colon and ascending colon. The polyps were 6 to 10                            mm in size. These polyps were removed with a cold                            snare. Resection and retrieval were complete.                           The exam was otherwise without  abnormality on                            direct and retroflexion views. Complications:            No immediate complications. Estimated Blood Loss:     Estimated blood loss: none. Impression:               - Decreased sphincter tone found on digital rectal                            exam.                           - The examined portion of the ileum was normal.                           -  Diverticulosis in the left colon.                           - One 4 mm polyp in the descending colon, removed                            piecemeal using a cold biopsy forceps. Resected and                            retrieved.                           - Three 6 to 10 mm polyps in the transverse colon                            and in the ascending colon, removed with a cold                            snare. Resected and retrieved.                           - The examination was otherwise normal on direct                            and retroflexion views. Recommendation:           - Patient has a contact number available for                            emergencies. The signs and symptoms of potential                            delayed complications were discussed with the                            patient. Return to normal activities tomorrow.                            Written discharge instructions were provided to the                            patient.                           - Resume previous diet.                           - Continue present medications. See EGD report                            regarding discontinuation of aspirin for 4 weeks.                           - Await pathology results.                           -  Repeat colonoscopy is recommended for                            surveillance. The colonoscopy date will be                            determined after pathology results from today's                            exam become available for review. Gaspard Isbell L. [name], MD 11/13/2016  4:11:33 PM This report has been signed electronically.

## 2016-11-13 NOTE — Op Note (Signed)
[facility] Patient Name: Shari Horn Procedure Date: 11/13/2016 3:18 PM MRN: [id number] Endoscopist: [place]. Shari Horn , MD Age: 76 Referring MD:  Date of Birth: 22-Feb-1940 Gender: Female Account #: [id number] Procedure:                Upper GI endoscopy Indications:              Dysphagia, Heartburn, Vomiting (all occasionally) Medicines:                Monitored Anesthesia Care Procedure:                Pre-Anesthesia Assessment:                           - Prior to the procedure, a History and Physical                            was performed, and patient medications and                            allergies were reviewed. The patient's tolerance of                            previous anesthesia was also reviewed. The risks                            and benefits of the procedure and the sedation                            options and risks were discussed with the patient.                            All questions were answered, and informed consent                            was obtained. Anticoagulants: The patient has taken                            aspirin. It was decided not to withhold this                            medication prior to the procedure. ASA Grade                            Assessment: II - A patient with mild systemic                            disease. After reviewing the risks and benefits,                            the patient was deemed in satisfactory condition to                            undergo the procedure.  After obtaining informed consent, the endoscope was                            passed under direct vision. Throughout the                            procedure, the patient's blood pressure, pulse, and                            oxygen saturations were monitored continuously. The                            Endoscope was introduced through the mouth, and                            advanced to the second part of  duodenum. The upper                            GI endoscopy was accomplished without difficulty.                            The patient tolerated the procedure well. Scope In: Scope Out: Findings:                 The larynx was normal.                           The esophagus was normal aside from mild distal                            tortuosity. Specifically, there was no stricture or                            other finding requiring dilation.                           Three non-bleeding superficial gastric ulcers were                            found in the gastric antrum. The largest lesion was                            5 mm in largest dimension. Biopsies were taken from                            the stomach with a cold forceps for histology.                            (Sidney protocol).                           The cardia and gastric fundus were normal on                            retroflexion.  The examined duodenum was normal. Complications:            No immediate complications. Estimated Blood Loss:     Estimated blood loss was minimal. Impression:               - Normal larynx.                           - Normal esophagus.                           - Non-bleeding gastric ulcers. Stomach biopsied to                            rule out H. pylori. If negative, the ulcers are                            most likely aspirin-related.                           - Normal examined duodenum.                           The patient has non-erosive gastroesophageal                            reflux. Intermittent dysphagia is likely                            reflux-related dysmotility or due to mild distal                            tortuosity. Recommendation:           - Patient has a contact number available for                            emergencies. The signs and symptoms of potential                            delayed complications were discussed with the                             patient. Return to normal activities tomorrow.                            Written discharge instructions were provided to the                            patient.                           - Resume previous diet.                           - No aspirin, ibuprofen, naproxen, or other  non-steroidal anti-inflammatory drugs for 4 weeks.                            Then patient should consult with primary care                            provider regarding how strongly aspirin is                            indicated.                           - Await pathology results.                           - Take the pantoparazole once daily as recently                            prescribed (patient had not yet felt she needed it                            for intermittent heartburn)                           - See the other procedure note for documentation of                            additional recommendations. Mateus Rewerts L. Shari Carrow, MD 11/13/2016 4:06:39 PM This report has been signed electronically.

## 2016-11-16 ENCOUNTER — Telehealth: Payer: Self-pay | Admitting: *Deleted

## 2016-11-16 NOTE — Telephone Encounter (Signed)
  Follow up Call-  Call back number 11/13/2016  Post procedure Call Back phone  # [phone]  Permission to leave phone message No  Some recent data might be hidden     Patient questions:  Do you have a fever, pain , or abdominal swelling? No. Pain Score  0 *  Have you tolerated food without any problems? Yes.    Have you been able to return to your normal activities? Yes.    Do you have any questions about your discharge instructions: Diet   No. Medications  No. Follow up visit  No.  Do you have questions or concerns about your Care? No.  Actions: * If pain score is 4 or above: No action needed, pain <4.

## 2016-11-26 ENCOUNTER — Telehealth: Payer: Self-pay | Admitting: Endocrinology

## 2016-11-26 ENCOUNTER — Encounter (INDEPENDENT_AMBULATORY_CARE_PROVIDER_SITE_OTHER): Payer: Self-pay

## 2016-11-26 ENCOUNTER — Other Ambulatory Visit (INDEPENDENT_AMBULATORY_CARE_PROVIDER_SITE_OTHER): Payer: Medicare Other

## 2016-11-26 DIAGNOSIS — E063 Autoimmune thyroiditis: Secondary | ICD-10-CM | POA: Diagnosis not present

## 2016-11-26 LAB — TSH: TSH: 6.15 u[IU]/mL — ABNORMAL HIGH (ref 0.35–4.50)

## 2016-11-26 LAB — T4, FREE: FREE T4: 0.71 ng/dL (ref 0.60–1.60)

## 2016-11-26 NOTE — Telephone Encounter (Signed)
Patient want her lab results mailed to her when they come back.

## 2016-11-30 ENCOUNTER — Ambulatory Visit: Payer: Medicare Other | Admitting: Endocrinology

## 2016-11-30 NOTE — Telephone Encounter (Signed)
-----   Message from Elayne Snare, MD sent at 11/30/2016 11:27 AM EST ----- Patient needs to be seen in follow-up after labs

## 2016-11-30 NOTE — Telephone Encounter (Signed)
Spoke with the pt and asked her to schedule an appt. She cancelled her fu on 11/12 and upon calling her now she will have to call back when she has her schedule with her.

## 2016-12-08 ENCOUNTER — Encounter: Payer: Self-pay | Admitting: Endocrinology

## 2016-12-15 ENCOUNTER — Ambulatory Visit: Payer: Medicare Other | Admitting: Family Medicine

## 2016-12-15 ENCOUNTER — Encounter: Payer: Self-pay | Admitting: Family Medicine

## 2016-12-15 VITALS — BP 131/73 | HR 76 | Ht 67.0 in | Wt 195.0 lb

## 2016-12-15 DIAGNOSIS — E039 Hypothyroidism, unspecified: Secondary | ICD-10-CM | POA: Diagnosis not present

## 2016-12-15 DIAGNOSIS — Z23 Encounter for immunization: Secondary | ICD-10-CM

## 2016-12-15 MED ORDER — LEVOTHYROXINE SODIUM 75 MCG PO TABS: 75.0000 ug | ORAL_TABLET | Freq: Every day | ORAL | 1 refills | Status: DC

## 2016-12-15 NOTE — Progress Notes (Signed)
   Subjective:  Shari Horn is a 76 y.o. female who presents today with a chief complaint of hypothyroidsm.   HPI:  Hypothyroidism, Established Problem, Worsening Patient has been on a stable dose of levothyroxine 75 mcg daily for the last couple years.  She currently takes her medication in the middle the night.  Because of this she will frequently forget to take doses.  She has been a little bit more fatigued over the past few months.  No constipation or diarrhea.  No fevers or chills.  No hot or cold intolerance.  ROS: Per HPI  Objective:  Physical Exam: BP 131/73   Pulse 76   Ht 5\' 7"  (1.702 m)   Wt 195 lb (88.5 kg)   SpO2 97%   BMI 30.54 kg/m   Gen: NAD, resting comfortably HEENT no thyromegaly. CV: RRR with no murmurs appreciated Pulm: NWOB, CTAB with no crackles, wheezes, or rhonchi  Assessment/Plan:  Hypothyroidism Last TSH mildly elevated and patient has been more fatigued..  Normal free T4.  Patient is missing a few doses per week related to her unusual dosing schedule.  Advised patient to take medication first thing in the morning and then not eat for at least an hour afterwards.  No change today.  Continue levothyroxine 75 mcg daily.  Patient will follow-up in about 3-4 weeks to recheck her TSH and free T4.  Preventative healthcare Flu shot and pneumonia shot given today.  Algis Greenhouse. Jerline Pain, MD 12/15/2016 4:47 PM

## 2016-12-15 NOTE — Patient Instructions (Signed)
No changes today.  Come back in 3-4 weeks to recheck your labs.  Take your medication when you wake up, about an hour before eating.  Take care,  Dr Jerline Pain

## 2016-12-15 NOTE — Assessment & Plan Note (Addendum)
Last TSH mildly elevated and patient has been more fatigued..  Normal free T4.  Patient is missing a few doses per week related to her unusual dosing schedule.  Advised patient to take medication first thing in the morning and then not eat for at least an hour afterwards.  No change today.  Continue levothyroxine 75 mcg daily.  Patient will follow-up in about 3-4 weeks to recheck her TSH and free T4.

## 2017-01-05 ENCOUNTER — Other Ambulatory Visit: Payer: Medicare Other

## 2017-01-16 ENCOUNTER — Emergency Department (HOSPITAL_COMMUNITY): Payer: Medicare Other

## 2017-01-16 ENCOUNTER — Other Ambulatory Visit: Payer: Self-pay

## 2017-01-16 DIAGNOSIS — Y999 Unspecified external cause status: Secondary | ICD-10-CM | POA: Diagnosis not present

## 2017-01-16 DIAGNOSIS — W0110XA Fall on same level from slipping, tripping and stumbling with subsequent striking against unspecified object, initial encounter: Secondary | ICD-10-CM | POA: Diagnosis not present

## 2017-01-16 DIAGNOSIS — R296 Repeated falls: Secondary | ICD-10-CM | POA: Insufficient documentation

## 2017-01-16 DIAGNOSIS — Z87891 Personal history of nicotine dependence: Secondary | ICD-10-CM | POA: Insufficient documentation

## 2017-01-16 DIAGNOSIS — E039 Hypothyroidism, unspecified: Secondary | ICD-10-CM | POA: Insufficient documentation

## 2017-01-16 DIAGNOSIS — Y939 Activity, unspecified: Secondary | ICD-10-CM | POA: Diagnosis not present

## 2017-01-16 DIAGNOSIS — Y92002 Bathroom of unspecified non-institutional (private) residence single-family (private) house as the place of occurrence of the external cause: Secondary | ICD-10-CM | POA: Insufficient documentation

## 2017-01-16 DIAGNOSIS — Z79899 Other long term (current) drug therapy: Secondary | ICD-10-CM | POA: Insufficient documentation

## 2017-01-16 DIAGNOSIS — S20212A Contusion of left front wall of thorax, initial encounter: Secondary | ICD-10-CM | POA: Insufficient documentation

## 2017-01-16 DIAGNOSIS — I2510 Atherosclerotic heart disease of native coronary artery without angina pectoris: Secondary | ICD-10-CM | POA: Diagnosis not present

## 2017-01-16 LAB — I-STAT TROPONIN, ED: TROPONIN I, POC: 0 ng/mL (ref 0.00–0.08)

## 2017-01-16 NOTE — ED Triage Notes (Signed)
Patient arrives from home. States she fell last night and her left ribs have hurt all day. History or recent falls x5 in recent months. When speaking with patient she seems unclear about whether she tripped or lost consciousness. Mild confusion noted, but no other neuro deficits apparent. Patient did state "when I woke up, I knew I must have fallen again". Some bruising noted to left side of abdomen below pain site.

## 2017-01-17 ENCOUNTER — Emergency Department (HOSPITAL_COMMUNITY)
Admission: EM | Admit: 2017-01-17 | Discharge: 2017-01-17 | Disposition: A | Discharge status: Home / Self Care | Payer: Medicare Other | Attending: Emergency Medicine | Admitting: Emergency Medicine

## 2017-01-17 ENCOUNTER — Other Ambulatory Visit: Payer: Self-pay

## 2017-01-17 DIAGNOSIS — R296 Repeated falls: Secondary | ICD-10-CM

## 2017-01-17 DIAGNOSIS — S20212A Contusion of left front wall of thorax, initial encounter: Secondary | ICD-10-CM

## 2017-01-17 LAB — CBC
HCT: 39.3 % (ref 36.0–46.0)
Hemoglobin: 13.1 g/dL (ref 12.0–15.0)
MCH: 32 pg (ref 26.0–34.0)
MCHC: 33.3 g/dL (ref 30.0–36.0)
MCV: 95.9 fL (ref 78.0–100.0)
PLATELETS: 270 10*3/uL (ref 150–400)
RBC: 4.1 MIL/uL (ref 3.87–5.11)
RDW: 13.5 % (ref 11.5–15.5)
WBC: 7.5 10*3/uL (ref 4.0–10.5)

## 2017-01-17 LAB — BASIC METABOLIC PANEL
ANION GAP: 8 (ref 5–15)
BUN: 17 mg/dL (ref 6–20)
CALCIUM: 9.2 mg/dL (ref 8.9–10.3)
CO2: 25 mmol/L (ref 22–32)
CREATININE: 0.93 mg/dL (ref 0.44–1.00)
Chloride: 104 mmol/L (ref 101–111)
GFR, EST NON AFRICAN AMERICAN: 58 mL/min — AB (ref >60)
Glucose, Bld: 135 mg/dL — ABNORMAL HIGH (ref 65–99)
Potassium: 4.1 mmol/L (ref 3.5–5.1)
SODIUM: 137 mmol/L (ref 135–145)

## 2017-01-17 MED ORDER — OXYCODONE-ACETAMINOPHEN 5-325 MG PO TABS
1.0000 | ORAL_TABLET | Freq: Once | ORAL | Status: AC
  Administered 2017-01-17: 1 via ORAL
  Filled 2017-01-17: qty 1

## 2017-01-17 MED ORDER — OXYCODONE-ACETAMINOPHEN 5-325 MG PO TABS: 1.0000 | ORAL_TABLET | ORAL | 0 refills | Status: DC | PRN

## 2017-01-17 MED ORDER — NAPROXEN 375 MG PO TABS: 375.0000 mg | ORAL_TABLET | Freq: Two times a day (BID) | ORAL | 0 refills | Status: DC

## 2017-01-17 NOTE — Discharge Instructions (Addendum)
1.  Take Percocet 1-2 tablets every 4-6 hours as needed for pain.  Take Colace or other stool softener while taking Percocet to avoid constipation. 2.  Use your incentive spirometer every hour while awake. 3.  Return to the emergency department if your symptoms are worsening or changing. 4.  Follow-up with both your family doctor and your neurologist, Dr. Delice Lesch to discuss your frequent falls.

## 2017-01-17 NOTE — ED Provider Notes (Signed)
[facility] EMERGENCY DEPARTMENT Provider Note   CSN: [id number] Arrival date & time: 01/16/17  2253     History   Chief Complaint Chief Complaint  Patient presents with  . Loss of Consciousness  . Fall  . Chest Pain    HPI Shari Horn is a 76 y.o. female.  HPI Patient reports she is falling more frequently over the past 2 months.  She reports that she is awake and alert during these episodes.  She loses her balance and is aware that she is falling.  Patient reports that yesterday she fell and struck her left side falling between the toilet in the tub.  She did not have loss of consciousness or head injury.  She reports there was a lot of pain on the left side of her chest.  She was then sitting and moved and got a sudden severe pain in her left side and felt something "pop".  She then determined to come to the emergency department for evaluation. Past Medical History:  Diagnosis Date  . Carotid artery disease ([place])   . Depression   . Dyslipidemia   . Hyperlipidemia   . Hypothyroidism   . 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 PVC's (premature ventricular contractions)     Patient Active Problem List   Diagnosis Date Noted  . Gastroesophageal reflux disease without esophagitis 10/12/2016  . Encounter for screening colonoscopy 10/12/2016  . Osteopenia 09/17/2016  . Dysphagia 09/14/2016  . Bipolar 2 disorder ([place]) 09/14/2016  . Hypothyroidism 09/14/2016  . New onset of headaches after age 42 05/27/2016  . Positional vertigo 09/08/2013  . 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 PVC's (premature ventricular contractions) 12/20/2012  . Dyslipidemia 12/20/2012    Past Surgical History:  Procedure Laterality Date  . BREAST BIOPSY    . BREAST EXCISIONAL BIOPSY Left 1990  . BREAST EXCISIONAL BIOPSY Left 1996  . Carotid Doppler  12/2011   0-49% RICA stenosis, 58-85% LICA stenosis  . TRANSTHORACIC ECHOCARDIOGRAM  12/2011   EF 60-65%, grade 1  diastolic dysfunction; trivial AV regurg; calcified MV annulus    OB History    No data available       Home Medications    Prior to Admission medications   Medication Sig Start Date End Date Taking? Authorizing Provider  clonazePAM (KLONOPIN) 0.5 MG tablet Take 0.5 mg by mouth 3 (three) times daily as needed for anxiety.   Yes [provider]  FLUoxetine (PROZAC) 20 MG tablet Take 60 mg by mouth daily.   Yes [provider]  lamoTRIgine (LAMICTAL) 100 MG tablet Take 100 mg by mouth daily.   Yes [provider]  levothyroxine (SYNTHROID, LEVOTHROID) 75 MCG tablet Take 1 tablet (75 mcg total) by mouth daily. 12/15/16  Yes Vivi Barrack, MD  Omega-3 Fatty Acids (FISH OIL PO) Take 2,000 mg by mouth daily.    Yes [provider]  pantoprazole (PROTONIX) 40 MG tablet Take 1 tablet (40 mg total) by mouth daily. 10/12/16  Yes Esterwood, Amy S, PA-C  ezetimibe (ZETIA) 10 MG tablet TAKE 1 TABLET(10 MG) BY MOUTH DAILY 07/24/16   Hilty, Nadean Corwin, MD  Multiple Vitamin (MULTIVITAMIN) capsule Take 1 capsule by mouth daily.    [provider]  Na Sulfate-K Sulfate-Mg Sulf 17.5-3.13-1.6 GM/180ML SOLN Take as directed for colonoscopy 10/12/16   Esterwood, Amy S, PA-C  naproxen (NAPROSYN) 375 MG tablet Take 1 tablet (375 mg total) by mouth 2 (  two) times daily. 01/17/17   Charlesetta Shanks, MD  oxyCODONE-acetaminophen (PERCOCET) 5-325 MG tablet Take 1-2 tablets by mouth every 4 (four) hours as needed. 01/17/17   Charlesetta Shanks, MD  QUEtiapine (SEROQUEL) 400 MG tablet Take 400 mg by mouth at bedtime.  09/10/16   [provider]  rosuvastatin (CRESTOR) 40 MG tablet TAKE 1 TABLET(40 MG) BY MOUTH DAILY 07/24/16   Hilty, Nadean Corwin, MD  senna (SENOKOT) 8.6 MG TABS tablet Take 4 tablets by mouth at bedtime.     [provider]    Family History Family History  Problem Relation Age of Onset  . Heart failure Mother   . Thyroid disease Mother   .  Hypertension Mother   . Diabetes Mother   . Arthritis/Rheumatoid Father   . Heart Problems Maternal Grandmother   . Heart disease Brother        valve replacement  . Heart disease Child   . Colon cancer Neg Hx   . Esophageal cancer Neg Hx   . Stomach cancer Neg Hx     Social History Social History   Tobacco Use  . Smoking status: Former Smoker    Years: 10.00    Types: Cigarettes    Last attempt to quit: 12/19/1972    Years since quitting: 44.1  . Smokeless tobacco: Never Used  Substance Use Topics  . Alcohol use: No    Comment: quit in 1973  . Drug use: No     Allergies   Codeine   Review of Systems Review of Systems 10 Systems reviewed and are negative for acute change except as noted in the HPI.   Physical Exam Updated Vital Signs BP (!) 125/58   Pulse 69   Temp 97.6 F (36.4 C) (Oral)   Resp (!) 21   SpO2 93%   Physical Exam  Constitutional: She is oriented to person, place, and time. She appears well-developed and well-nourished. No distress.  HENT:  Head: Normocephalic and atraumatic.  Right Ear: External ear normal.  Left Ear: External ear normal.  Nose: Nose normal.  Eyes: Conjunctivae and EOM are normal. Pupils are equal, round, and reactive to light.  Neck: Neck supple.  Cardiovascular: Normal rate, regular rhythm, normal heart sounds and intact distal pulses.  No murmur heard. Pulmonary/Chest: Effort normal and breath sounds normal. No respiratory distress. She exhibits tenderness.  Patient has contusion to left lower chest wall posteriorly at lower ribs that is approximately 12 x 5 cm.  She has severe tenderness to palpation over left lower ribs at approximately the posterior to the anterior axillary line.  It is very reproducible by position changes causing a severe sharp pain.  Abdominal: Soft. She exhibits no distension. There is no tenderness. There is no guarding.  Musculoskeletal: Normal range of motion. She exhibits no edema, tenderness  or deformity.  Neurological: She is alert and oriented to person, place, and time. No cranial nerve deficit. She exhibits normal muscle tone. Coordination normal.  Skin: Skin is warm and dry.  Psychiatric: She has a normal mood and affect.  Nursing note and vitals reviewed.    ED Treatments / Results  Labs (all labs ordered are listed, but only abnormal results are displayed) Labs Reviewed  BASIC METABOLIC PANEL - Abnormal; Notable for the following components:      Result Value   Glucose, Bld 135 (*)    GFR calc non Af Amer 58 (*)    All other components within normal limits  CBC  I-STAT TROPONIN, ED    EKG  EKG Interpretation  Date/Time:  Saturday January 16 2017 23:27:03 EST Ventricular Rate:  76 PR Interval:  180 QRS Duration: 72 QT Interval:  420 QTC Calculation: 472 R Axis:   33 Text Interpretation:  Normal sinus rhythm Nonspecific ST abnormality Abnormal ECG normal. no change from previous Confirmed by Charlesetta Shanks [id number]) on 01/17/2017 8:44:02 AM       Radiology Dg Ribs Unilateral W/chest Left  Result Date: 01/16/2017 CLINICAL DATA:  Status post fall, with left anterior lower rib pain. Initial encounter. EXAM: LEFT RIBS AND CHEST - 3+ VIEW COMPARISON:  Chest radiograph performed 01/10/2014 FINDINGS: No displaced rib fractures are seen. There is mild elevation of the right hemidiaphragm. The lungs are grossly clear. No pleural effusion or pneumothorax is seen. The cardiomediastinal silhouette is normal in size. No acute osseous abnormalities are identified. IMPRESSION: No displaced rib fracture seen. Mild elevation of the right hemidiaphragm. Lungs remain grossly clear. Electronically Signed   By: Garald Balding M.D.   On: 01/16/2017 23:50    Procedures Procedures (including critical care time)  Medications Ordered in ED Medications  oxyCODONE-acetaminophen (PERCOCET/ROXICET) 5-325 MG per tablet 1 tablet (not administered)     Initial Impression /  Assessment and Plan / ED Course  I have reviewed the triage vital signs and the nursing notes.  Pertinent labs & imaging results that were available during my care of the patient were reviewed by me and considered in my medical decision making (see chart for details).      Final Clinical Impressions(s) / ED Diagnoses   Final diagnoses:  Frequent falls  Chest wall contusion, left, initial encounter   Patient has clinical rib fracture versus chest wall contusion.  I suspect occult rib fracture.  Patient has no respiratory distress and lungs showed no pneumothorax. Vital  Signs are stable.  Patient reports frequent falls over the past 2 months.  These appear to be mechanical falls related to imbalance.  Patient has seen Dr. Delice Lesch and had MRI done this summer with no abnormal findings.  At this time I do not think that further cerebral imaging will be helpful in identifying cause of mechanical falls.  Clinically and physically patient has good physical condition.  He is advised to continue working with both her PCP and Dr. Delice Lesch for any further diagnostic or physical therapy interventions that could be helpful.  Patient's daughter lives nearby to assist at home. ED Discharge Orders        Ordered    oxyCODONE-acetaminophen (PERCOCET) 5-325 MG tablet  Every 4 hours PRN     01/17/17 0850    naproxen (NAPROSYN) 375 MG tablet  2 times daily     01/17/17 0850       Charlesetta Shanks, MD 01/17/17 [phone]

## 2017-01-25 ENCOUNTER — Encounter: Payer: Self-pay | Admitting: Family Medicine

## 2017-01-25 ENCOUNTER — Ambulatory Visit: Payer: Medicare Other | Admitting: Family Medicine

## 2017-01-25 VITALS — BP 126/74 | HR 82 | Temp 97.7°F | Ht 67.0 in | Wt 195.0 lb

## 2017-01-25 DIAGNOSIS — R2681 Unsteadiness on feet: Secondary | ICD-10-CM | POA: Insufficient documentation

## 2017-01-25 DIAGNOSIS — E039 Hypothyroidism, unspecified: Secondary | ICD-10-CM | POA: Diagnosis not present

## 2017-01-25 DIAGNOSIS — R296 Repeated falls: Secondary | ICD-10-CM | POA: Diagnosis not present

## 2017-01-25 DIAGNOSIS — W19XXXA Unspecified fall, initial encounter: Secondary | ICD-10-CM

## 2017-01-25 NOTE — Progress Notes (Signed)
    Subjective:  Shari Horn is a 77 y.o. female who presents today with a chief complaint of frequent falls.   HPI:  Frequent falls, New Issue Symptoms started about a month ago.  She has had 4-5 falls over that time.  No obvious precipitating events.  Falls seem to be mostly mechanical in nature.  No loss of consciousness.  No preceding symptoms.  She was evaluated in the ED about a week ago after a fall left-sided rib pain.  Was diagnosed with a rib contusion and possible fracture.  She still has a little bit of pain there however has significantly improved.  Recently had eyes examined about a month ago with no changes to her prescription.  No weakness or numbness.  No fevers or chills.  No obvious alleviating or aggravating factors.  Hypothyroidism, established problem Patient seen about 5 weeks ago for this problem.  At that time patient was directed to take her levothyroxine at the same time every morning.  Previously she had been waking up in the middle of night to take this medication and possibly was missing some doses.  Aside from the above, has not noticed any side effects or other changes since changing her schedule.   ROS: Per HPI  PMH: she reports that she quit smoking about 44 years ago. Her smoking use included cigarettes. She quit after 10.00 years of use. she has never used smokeless tobacco. She reports that she does not drink alcohol or use drugs.  Objective:  Physical Exam: BP 126/74 (BP Location: Left Arm, Patient Position: Sitting, Cuff Size: Normal)   Pulse 82   Temp 97.7 F (36.5 C) (Oral)   Ht 5\' 7"  (1.702 m)   Wt 195 lb (88.5 kg)   SpO2 96%   BMI 30.54 kg/m   Gen: NAD, resting comfortably CV: RRR with no murmurs appreciated. Pulm: NWOB, CTAB with no crackles, wheezes, or rhonchi Neuro: Cranial nerves II through XII intact.  Strength 5 out of 5 in upper and lower extremities.  Finger-nose-finger testing intact bilaterally.  Biceps and patellar reflexes 2+ and  symmetric bilaterally.  Sensation light touch grossly intact throughout. Psych: Normal affect and thought content  Assessment/Plan:  Frequent falls No red flag signs or symptoms.  Her neurological exam today is essentially normal.  She had recent blood work in the ED including CBC and CMET which were normal.  Also had a recent eye exam with no change in prescription.  We will check vitamin D, vitamin B12, and TSH today.  Symptoms likely secondary to deconditioning/muscular weakness.  We will send patient for PT eval/treat.  Return precautions reviewed.  Hypothyroidism Continue current dose of levothyroxine 75 mcg daily.  Check TSH and free T4.  Algis Greenhouse. Jerline Pain, MD 01/25/2017 4:43 PM

## 2017-01-25 NOTE — Patient Instructions (Signed)
We will check blood work today.  I will also send you to physical therapy.  Your neurological exam today is normal.  Take care, Dr. Jerline Pain

## 2017-01-25 NOTE — Assessment & Plan Note (Signed)
Continue current dose of levothyroxine 75 mcg daily.  Check TSH and free T4.

## 2017-01-25 NOTE — Assessment & Plan Note (Signed)
No red flag signs or symptoms.  Her neurological exam today is essentially normal.  She had recent blood work in the ED including CBC and CMET which were normal.  Also had a recent eye exam with no change in prescription.  We will check vitamin D, vitamin B12, and TSH today.  Symptoms likely secondary to deconditioning/muscular weakness.  We will send patient for PT eval/treat.  Return precautions reviewed.

## 2017-01-26 LAB — VITAMIN B12: VITAMIN B 12: 225 pg/mL (ref 211–911)

## 2017-01-26 LAB — VITAMIN D 25 HYDROXY (VIT D DEFICIENCY, FRACTURES): VITD: 61.87 ng/mL (ref 30.00–100.00)

## 2017-01-26 LAB — T4, FREE: Free T4: 0.58 ng/dL — ABNORMAL LOW (ref 0.60–1.60)

## 2017-01-26 LAB — TSH: TSH: 11.67 u[IU]/mL — AB (ref 0.35–4.50)

## 2017-01-27 ENCOUNTER — Other Ambulatory Visit: Payer: Self-pay

## 2017-01-27 MED ORDER — LEVOTHYROXINE SODIUM 100 MCG PO TABS: 100.0000 ug | ORAL_TABLET | Freq: Every day | ORAL | 0 refills | Status: DC

## 2017-01-27 NOTE — Progress Notes (Signed)
Patient's thyroid levels are too low. This could explain some of her symptoms. Her vitamin levels are normal.  We should increase her levothyroxine level to 125mcg daily - can you call this in for her?  I still want her to see physical therapy.  She should come back to have her levels rechecked in 4-6 weeks.  Shari Horn. Shari Pain, MD 01/27/2017 8:11 AM

## 2017-02-01 NOTE — Progress Notes (Signed)
I called the patient and left a voicemail.

## 2017-03-10 ENCOUNTER — Encounter: Payer: Self-pay | Admitting: Family Medicine

## 2017-03-10 ENCOUNTER — Ambulatory Visit: Payer: Medicare Other | Admitting: Family Medicine

## 2017-03-10 VITALS — BP 128/72 | HR 78 | Temp 97.3°F | Ht 67.0 in

## 2017-03-10 DIAGNOSIS — R296 Repeated falls: Secondary | ICD-10-CM

## 2017-03-10 DIAGNOSIS — R6889 Other general symptoms and signs: Secondary | ICD-10-CM | POA: Diagnosis not present

## 2017-03-10 DIAGNOSIS — E039 Hypothyroidism, unspecified: Secondary | ICD-10-CM | POA: Diagnosis not present

## 2017-03-10 LAB — TSH: TSH: 1.79 u[IU]/mL (ref 0.35–4.50)

## 2017-03-10 NOTE — Patient Instructions (Signed)
We will check your thyroid level today.  Please schedule an appointment to meet with our physical therapist soon.  Is let me know if your falls do not improve with physical therapy.  Please let me know if you have more memory issues.  Take care, Dr. Jerline Pain

## 2017-03-10 NOTE — Assessment & Plan Note (Addendum)
Stable since last visit.  Her neurological exam is essentially normal however she does have some suggestion of cognitive impairment based on her mini cog (2 out of 3 word recall and incorrect clock face).  She had MRI done last year which was normal.  Discussed referral to neurology however patient declined at this time.  We will check her TSH to make sure that it is an appropriate range.  Also advised patient to follow-up with physical therapy to work on her balance issues.  Return precautions reviewed.

## 2017-03-10 NOTE — Assessment & Plan Note (Signed)
See frequent falls A/P.  Patient with abnormal mini cog today.  Recommended neurology referral however patient deferred.  She had an MRI done last year which was normal.  We will readdress at her next office visit.  Hopefully will have some improvement as her TSH improves.

## 2017-03-10 NOTE — Assessment & Plan Note (Signed)
Continue Synthroid 100 mcg daily.  Check TSH today. 

## 2017-03-10 NOTE — Progress Notes (Signed)
   Subjective:  Shari Horn is a 77 y.o. female who presents today with a chief complaint of frequent falls.   HPI:  Frequent Falls, established problem, stable Patient seen about 6 weeks ago for this.  At that time referral was placed to physical therapy however patient has not yet seen them.  She has fallen once or twice over the past few weeks.  As noted in her last visit, falls seem to be mechanical in nature.  Patient will lose balance or tripped over an object and fall.  No preceding dizziness or other symptoms.  No syncopal episodes.  No weakness or numbness.  Forgetfulness, new issue Patient also reports increased forgetfulness over the past several weeks.  Of note, patient stated that she was seen in the ED for fall 2 weeks ago.  In fact, this visit occurred over 2 months ago.  Patient also had limited recollection of our visit from 6 weeks ago.  Denies being forgetful in her daily life, however occasionally does have issues with word finding.  As noted above, no weakness or numbness.  No headaches.  No vision changes.  No difficulty swallowing or speaking.  Hypothyroidism, established problem, stable Patient was found to have elevated TSH to 11 six weeks ago.  We increased her dose of Synthroid to 100 mcg daily.  She has been on this dose for the past 6 weeks and thinks that has helped with her energy levels.  No palpitations.  No constipation or diarrhea.  No skin or hair changes.   ROS: Per HPI  PMH: She reports that she quit smoking about 44 years ago. Her smoking use included cigarettes. She quit after 10.00 years of use. she has never used smokeless tobacco. She reports that she does not drink alcohol or use drugs.  Objective:  Physical Exam: BP 128/72 (BP Location: Left Arm, Patient Position: Sitting, Cuff Size: Normal)   Pulse 78   Temp (!) 97.3 F (36.3 C) (Oral)   Ht 5\' 7"  (1.702 m)   SpO2 98%   BMI 30.54 kg/m   Gen: NAD, resting comfortably CV: RRR with no murmurs  appreciated Pulm: NWOB, CTAB with no crackles, wheezes, or rhonchi Neuro: Cranial nerves II through XII intact.  Strength 5 out of 5 in upper and lower extremities.  Sensation light touch intact throughout.  Neuro-nose/finger testing intact bilaterally.  Patient with 2 out of 3 delayed word recall.  Please see below clock face drawing for "11:10"   Assessment/Plan:  Frequent falls Stable since last visit.  Her neurological exam is essentially normal however she does have some suggestion of cognitive impairment based on her mini cog (2 out of 3 word recall and incorrect clock face).  She had MRI done last year which was normal.  Discussed referral to neurology however patient declined at this time.  We will check her TSH to make sure that it is an appropriate range.  Also advised patient to follow-up with physical therapy to work on her balance issues.  Return precautions reviewed.  Hypothyroidism Continue Synthroid 100 mcg daily.  Check TSH today.  Forgetfulness See frequent falls A/P.  Patient with abnormal mini cog today.  Recommended neurology referral however patient deferred.  She had an MRI done last year which was normal.  We will readdress at her next office visit.  Hopefully will have some improvement as her TSH improves.  Algis Greenhouse. Jerline Pain, MD 03/10/2017 12:05 PM

## 2017-03-11 NOTE — Progress Notes (Signed)
Dr Marigene Ehlers interpretation of your lab work:  Your thyroid level is normal. Please continue taking your current dose. We can recheck in 6 months.    If you have any additional questions, please give [place] a call or send [place] a message through [place].  Take care, Dr Jerline Pain

## 2017-03-19 ENCOUNTER — Ambulatory Visit (INDEPENDENT_AMBULATORY_CARE_PROVIDER_SITE_OTHER): Payer: Medicare Other

## 2017-03-19 ENCOUNTER — Ambulatory Visit (INDEPENDENT_AMBULATORY_CARE_PROVIDER_SITE_OTHER): Payer: Medicare Other | Admitting: Orthopaedic Surgery

## 2017-03-19 ENCOUNTER — Encounter (INDEPENDENT_AMBULATORY_CARE_PROVIDER_SITE_OTHER): Payer: Self-pay | Admitting: Orthopaedic Surgery

## 2017-03-19 DIAGNOSIS — M7061 Trochanteric bursitis, right hip: Secondary | ICD-10-CM

## 2017-03-19 DIAGNOSIS — R0781 Pleurodynia: Secondary | ICD-10-CM | POA: Diagnosis not present

## 2017-03-19 MED ORDER — BUPIVACAINE HCL 0.25 % IJ SOLN
2.0000 mL | INTRAMUSCULAR | Status: AC | PRN
  Administered 2017-03-19: 2 mL via INTRA_ARTICULAR

## 2017-03-19 MED ORDER — LIDOCAINE HCL 1 % IJ SOLN
3.0000 mL | INTRAMUSCULAR | Status: AC | PRN
  Administered 2017-03-19: 3 mL

## 2017-03-19 MED ORDER — METHYLPREDNISOLONE ACETATE 40 MG/ML IJ SUSP
40.0000 mg | INTRAMUSCULAR | Status: AC | PRN
  Administered 2017-03-19: 40 mg via INTRA_ARTICULAR

## 2017-03-19 MED ORDER — TRAMADOL HCL 50 MG PO TABS: 50.0000 mg | ORAL_TABLET | Freq: Four times a day (QID) | ORAL | 0 refills | Status: DC | PRN

## 2017-03-19 NOTE — Progress Notes (Signed)
Office Visit Note   Patient: Shari Horn           Date of Birth: September 11, 1940           MRN: [id number] Visit Date: 03/19/2017              Requested by: Vivi Barrack, MD [address] [place], [place] [address] PCP: Vivi Barrack, MD   Assessment & Plan: Visit Diagnoses:  1. Trochanteric bursitis of right hip   2. Rib pain on right side     Plan: Impression is, trochanteric bursitis to the right hip.  Today, we will inject this with cortisone.  She is encouraged to rest ice and elevate as much as possible today and tomorrow.  We will add iliotibial band stretching to her physical therapy prescription and her primary care provider.  She will follow-up with [place] on an as-needed basis.  Follow-Up Instructions: Return if symptoms worsen or fail to improve.   Orders:  Orders Placed This Encounter  Procedures  . Large Joint Inj  . XR HIP UNILAT W OR W/O PELVIS 2-3 VIEWS RIGHT  . XR Ribs Unilateral Right   No orders of the defined types were placed in this encounter.     Procedures:  Trochanteric Bursa Large Joint Inj on 03/19/2017 10:30 AM Indications: pain Details: 22 G needle, lateral approach Medications: 3 mL lidocaine 1 %; 2 mL bupivacaine 0.25 %; 40 mg methylPREDNISolone acetate 40 MG/ML      Clinical Data: No additional findings.   Subjective: Chief Complaint  Patient presents with  . Right Hip - Pain    HPI and comes in today with right hip pain.  This is been intermittent pain which is been ongoing for the past several months.  She does note she is had several falls recently.  one of which occurred last week when she was in [place] where she fell on her right side.  Since then the pain has been exacerbated.  All of the pain she has is the lateral aspect.  No groin or anterior thigh pain.  Pain is worse walking as well as getting up from a seated position.  No previous hip pathology.  She has seen her primary care doctor for her multiple falls and has been given  a prescription for outpatient physical therapy.  She has not started therapy yet.  Review of Systems as detailed in HPI.  All others reviewed and are negative.   Objective: Vital Signs: There were no vitals taken for this visit.  Physical Exam well-developed well-nourished female no acute distress.  Alert and oriented x3.  Ortho Exam examination of her right hip reveals moderate ecchymosis to the lateral aspect.  Marked tenderness over the trochanteric bursa.  Negative logroll, negative straight leg raise.  She is rest intact distally.  Specialty Comments:  No specialty comments available.  Imaging: Xr Hip Unilat W Or W/o Pelvis 2-3 Views Right  Result Date: 03/19/2017 No structural abnormalities noted.  Xr Ribs Unilateral Right  Result Date: 03/19/2017 Negative for fracture    PMFS History: Patient Active Problem List   Diagnosis Date Noted  . Trochanteric bursitis of right hip 03/19/2017  . Rib pain on right side 03/19/2017  . Forgetfulness 03/10/2017  . Frequent falls 01/25/2017  . Gastroesophageal reflux disease without esophagitis 10/12/2016  . Osteopenia 09/17/2016  . Dysphagia 09/14/2016  . Bipolar 2 disorder ([place]) 09/14/2016  . Hypothyroidism 09/14/2016  . New onset of headaches after age  49 05/27/2016  . Positional vertigo 09/08/2013  . 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 PVC's (premature ventricular contractions) 12/20/2012  . Dyslipidemia 12/20/2012   Past Medical History:  Diagnosis Date  . Carotid artery disease ([place])   . Depression   . Dyslipidemia   . Hyperlipidemia   . Hypothyroidism   . 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 PVC's (premature ventricular contractions)     Family History  Problem Relation Age of Onset  . Heart failure Mother   . Thyroid disease Mother   . Hypertension Mother   . Diabetes Mother   . Arthritis/Rheumatoid Father   . Heart Problems Maternal Grandmother   . Heart disease Brother        valve replacement    . Heart disease Child   . Colon cancer Neg Hx   . Esophageal cancer Neg Hx   . Stomach cancer Neg Hx     Past Surgical History:  Procedure Laterality Date  . BREAST BIOPSY    . BREAST EXCISIONAL BIOPSY Left 1990  . BREAST EXCISIONAL BIOPSY Left 1996  . Carotid Doppler  12/2011   0-49% RICA stenosis, 00-86% LICA stenosis  . TRANSTHORACIC ECHOCARDIOGRAM  12/2011   EF 76-19%, grade 1 diastolic dysfunction; trivial AV regurg; calcified MV annulus   Social History   Occupational History  . Not on file  Tobacco Use  . Smoking status: Former Smoker    Years: 10.00    Types: Cigarettes    Last attempt to quit: 12/19/1972    Years since quitting: 44.2  . Smokeless tobacco: Never Used  Substance and Sexual Activity  . Alcohol use: No    Comment: quit in 1973  . Drug use: No  . Sexual activity: Not on file

## 2017-03-22 ENCOUNTER — Ambulatory Visit: Payer: Medicare Other

## 2017-03-22 ENCOUNTER — Ambulatory Visit (INDEPENDENT_AMBULATORY_CARE_PROVIDER_SITE_OTHER): Payer: Medicare Other | Admitting: Orthopaedic Surgery

## 2017-03-26 ENCOUNTER — Ambulatory Visit: Payer: Medicare Other | Admitting: Physical Therapy

## 2017-03-26 DIAGNOSIS — R2681 Unsteadiness on feet: Secondary | ICD-10-CM

## 2017-03-26 DIAGNOSIS — R296 Repeated falls: Secondary | ICD-10-CM

## 2017-03-26 NOTE — Therapy (Signed)
[facility] [address] [place], [place], [address] Phone: [phone]   Fax:  [phone]  Physical Therapy Evaluation  Patient Details  Name: Shari Horn MRN: [id number] Date of Birth: [birth year] Referring Provider: Dr. Dimas Chyle   Encounter Date: 03/26/2017  PT End of Session - 03/26/17 1007    Visit Number  1    Number of Visits  16    Date for PT Re-Evaluation  05/21/17    Authorization Type  [employer] $40 copay    PT Start Time  0925    PT Stop Time  1005    PT Time Calculation (min)  40 min    Equipment Utilized During Treatment  Gait belt    Activity Tolerance  Patient tolerated treatment well;Treatment limited secondary to medical complications (Comment)    Behavior During Therapy  [facility] for tasks assessed/performed       Past Medical History:  Diagnosis Date  . Carotid artery disease ([place])   . Depression   . Dyslipidemia   . Hyperlipidemia   . Hypothyroidism   . 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 PVC's (premature ventricular contractions)     Past Surgical History:  Procedure Laterality Date  . BREAST BIOPSY    . BREAST EXCISIONAL BIOPSY Left 1990  . BREAST EXCISIONAL BIOPSY Left 1996  . Carotid Doppler  12/2011   0-49% RICA stenosis, 94-49% LICA stenosis  . TRANSTHORACIC ECHOCARDIOGRAM  12/2011   EF 67-59%, grade 1 diastolic dysfunction; trivial AV regurg; calcified MV annulus    There were no vitals filed for this visit.   Subjective Assessment - 03/26/17 0928    Subjective  Pt is a 77 y/o female who presents to OPPT for decreased balance and multiple falls.  Pt reports 3 years ago daughter diagnosed with autoimmune illness, and pt has been primary caregiver for daughter which has limited her ability to exercise consistently.  Pt states falls began ~ 1 year ago, with an average of 1 fall every 1-2 weeks.    Limitations  Walking;Standing    Diagnostic tests  MRI brain negative    Patient Stated Goals  improve  balance and walking    Currently in Pain?  -- has some rib pain from fall; will not directly address         [facility] PT Assessment - 03/26/17 0931      Assessment   Medical Diagnosis  falls    Referring Provider  Dr. Dimas Chyle    Onset Date/Surgical Date  -- 1 year    Next MD Visit  PRN    Prior Therapy  none for this condition      Precautions   Precautions  None      Restrictions   Weight Bearing Restrictions  No      Balance Screen   Has the patient fallen in the past 6 months  Yes    How many times?  1 fall every 1-2 weeks; reports some falls (~25%) were due to slipping on magazines on floor    Has the patient had a decrease in activity level because of a fear of falling?   Yes    Is the patient reluctant to leave their home because of a fear of falling?   No      Home Environment   Living Environment  Private residence    Living Arrangements  Alone daughter is [place] (lives around the corner)    Type  of [place] to enter    Entrance Stairs-Number of Steps  3    Entrance Stairs-Rails  Right    Home Layout  Two level;Able to live on main level with bedroom/bathroom    Additional Comments  washer/dryer upstairs; pt still doing laundry      Prior Function   Level of Independence  Independent    Vocation  Part time employment    Systems developer, artist    Leisure  movies, writing, art, no regular exercise (prior to daughter's illness: walking ~ 1 hour)      Cognition   Overall Cognitive Status  Within Functional Limits for tasks assessed      Posture/Postural Control   Posture/Postural Control  Postural limitations      ROM / Strength   AROM / PROM / Strength  Strength      Strength   Overall Strength Comments  all tested in sitting    Strength Assessment Site  Hip;Knee;Ankle    Right/Left Hip  Right;Left    Right Hip Flexion  5/5    Left Hip Flexion  4/5    Right/Left Knee  Right;Left    Right Knee Flexion  5/5     Right Knee Extension  5/5    Left Knee Flexion  4/5    Left Knee Extension  5/5    Right/Left Ankle  Right;Left    Right Ankle Dorsiflexion  5/5    Left Ankle Dorsiflexion  5/5      Ambulation/Gait   Ambulation/Gait  Yes    Ambulation/Gait Assistance  4: Min guard    Ambulation Distance (Feet)  150 Feet    Assistive device  None    Gait Pattern  Poor foot clearance - left;Shuffle;Decreased step length - right;Decreased step length - left;Trendelenburg    Gait velocity  2.49 ft/sec 13.19 sec      Standardized Balance Assessment   Standardized Balance Assessment  Timed Up and Go Test;Dynamic Gait Index;Berg Balance Test      Berg Balance Test   Sit to Stand  Able to stand without using hands and stabilize independently    Standing Unsupported  Able to stand safely 2 minutes    Sitting with Back Unsupported but Feet Supported on Floor or Stool  Able to sit safely and securely 2 minutes    Stand to Sit  Sits safely with minimal use of hands    Transfers  Able to transfer with verbal cueing and /or supervision    Standing Unsupported with Eyes Closed  Able to stand 10 seconds with supervision    Standing Ubsupported with Feet Together  Able to place feet together independently and stand for 1 minute with supervision LOB returning to wide BOS    From Standing, Reach Forward with Outstretched Arm  Reaches forward but needs supervision    From Standing Position, Pick up Object from Floor  Able to pick up shoe, needs supervision    From Standing Position, Turn to Look Behind Over each Shoulder  Needs supervision when turning    Turn 360 Degrees  Able to turn 360 degrees safely but slowly    Standing Unsupported, Alternately Place Feet on Step/Stool  Able to complete 4 steps without aid or supervision    Standing Unsupported, One Foot in Front  Able to plae foot ahead of the other independently and hold 30 seconds    Standing on One Leg  Tries to lift leg/unable to hold 3 seconds but remains  standing independently    Total Score  37      Dynamic Gait Index   Level Surface  Mild Impairment    Change in Gait Speed  Mild Impairment    Gait with Horizontal Head Turns  Moderate Impairment    Gait with Vertical Head Turns  Moderate Impairment    Gait and Pivot Turn  Mild Impairment    Step Over Obstacle  Mild Impairment    Step Around Obstacles  Normal    Steps  Mild Impairment    Total Score  15      Timed Up and Go Test   Normal TUG (seconds)  18.19             Objective measurements completed on examination: See above findings.              PT Education - 03/26/17 1006    Education provided  Yes    Education Details  clinical findings, POC, goals of care    Person(s) Educated  Patient    Comprehension  Verbalized understanding       PT Short Term Goals - 03/26/17 1010      PT SHORT TERM GOAL #1   Title  verbalize understanding of fall prevention strategies    Status  New    Target Date  04/23/17      PT SHORT TERM GOAL #2   Title  improve BERG balance score to >/=45/56 for decreased fall risk    Status  New    Target Date  04/23/17      PT SHORT TERM GOAL #3   Title  improve timed up and go to < 15 sec for improved balance    Status  New    Target Date  04/23/17      PT SHORT TERM GOAL #4   Title  amb > 350' with LRAD modified independent for improved function    Status  New    Target Date  04/23/17        PT Long Term Goals - 03/26/17 1012      PT LONG TERM GOAL #1   Title  independent with HEP    Status  New    Target Date  05/21/17      PT LONG TERM GOAL #2   Title  improve BERG balance score to >/= 48/56 for improved balance    Status  New    Target Date  05/21/17      PT LONG TERM GOAL #3   Title  improve DGI to >/= 20/24 for improved mobility and decreased fall risk    Status  New    Target Date  05/21/17      PT LONG TERM GOAL #4   Title  improve timed up and go to < 13 sec for decreased fall risk    Status   New    Target Date  05/21/17             Plan - 03/26/17 1007    Clinical Impression Statement  Pt is a 77 y/o female who presents to OPPT for decreased balance and multiple falls.  Pt with poor short term memory and unable to recall PT instructions after < 1 min.  Pt high fall risk as indicated by testing, and demonstrates mild strength defitics and gait abnormalities.  Pt will benefit from PT to address deficits listed.  History and Personal Factors relevant to plan of care:  decreased cognition, multiple falls of unknown etiology    Clinical Presentation  Evolving    Clinical Decision Making  Moderate    Rehab Potential  Good    PT Frequency  2x / week    PT Duration  8 weeks    PT Treatment/Interventions  ADLs/Self Care Home Management;Canalith Repostioning;Electrical Stimulation;Cryotherapy;Moist Heat;DME Instruction;Gait training;Stair training;Functional mobility training;Therapeutic activities;Therapeutic exercise;Patient/family education;Neuromuscular re-education;Balance training;Vestibular    PT Next Visit Plan  establish HEP for strengthening and standing balance activities (?OTAGO), may need AD due to multiple falls, needs compliant surface and dynamic gait activities; static activities with head turns and EO/EC    Consulted and Agree with Plan of Care  Patient       Patient will benefit from skilled therapeutic intervention in order to improve the following deficits and impairments:  Abnormal gait, Decreased mobility, Decreased strength, Decreased balance, Difficulty walking, Postural dysfunction  Visit Diagnosis: Unsteadiness on feet - Plan: PT plan of care cert/re-cert  Repeated falls - Plan: PT plan of care cert/re-cert     Problem List Patient Active Problem List   Diagnosis Date Noted  . Trochanteric bursitis of right hip 03/19/2017  . Rib pain on right side 03/19/2017  . Forgetfulness 03/10/2017  . Frequent falls 01/25/2017  . Gastroesophageal reflux  disease without esophagitis 10/12/2016  . Osteopenia 09/17/2016  . Dysphagia 09/14/2016  . Bipolar 2 disorder ([place]) 09/14/2016  . Hypothyroidism 09/14/2016  . New onset of headaches after age 41 05/27/2016  . Positional vertigo 09/08/2013  . Bilateral carotid artery disease ([place]) 12/20/2012  . PVC's (premature ventricular contractions) 12/20/2012  . Dyslipidemia 12/20/2012      Laureen Abrahams, PT, DPT 03/26/17 10:16 AM    [facility] [place], [place], [address] Phone: [phone]   Fax:  [phone]  Name: ANGELEE BAHR MRN: [id number] Date of Birth: Feb 17, 1940

## 2017-03-31 ENCOUNTER — Encounter: Payer: Self-pay | Admitting: Physical Therapy

## 2017-03-31 ENCOUNTER — Ambulatory Visit: Payer: Medicare Other | Admitting: Physical Therapy

## 2017-03-31 DIAGNOSIS — R296 Repeated falls: Secondary | ICD-10-CM

## 2017-03-31 DIAGNOSIS — R2681 Unsteadiness on feet: Secondary | ICD-10-CM

## 2017-03-31 NOTE — Therapy (Signed)
[facility] [address] [place], [place], [address] Phone: [phone]   Fax:  [phone]  Physical Therapy Treatment  Patient Details  Name: Shari Horn MRN: [id number] Date of Birth: [birth year] Referring Provider: Dr. Dimas Chyle   Encounter Date: 03/31/2017  PT End of Session - 03/31/17 1354    Visit Number  2    Number of Visits  16    Date for PT Re-Evaluation  05/21/17    Authorization Type  [employer] $40 copay    PT Start Time  1300    PT Stop Time  1346    PT Time Calculation (min)  46 min    Equipment Utilized During Treatment  Gait belt    Activity Tolerance  Patient tolerated treatment well;Treatment limited secondary to medical complications (Comment)    Behavior During Therapy  [facility] for tasks assessed/performed       Past Medical History:  Diagnosis Date  . Carotid artery disease ([place])   . Depression   . Dyslipidemia   . Hyperlipidemia   . Hypothyroidism   . 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 PVC's (premature ventricular contractions)     Past Surgical History:  Procedure Laterality Date  . BREAST BIOPSY    . BREAST EXCISIONAL BIOPSY Left 1990  . BREAST EXCISIONAL BIOPSY Left 1996  . Carotid Doppler  12/2011   0-49% RICA stenosis, 87-56% LICA stenosis  . TRANSTHORACIC ECHOCARDIOGRAM  12/2011   EF 43-32%, grade 1 diastolic dysfunction; trivial AV regurg; calcified MV annulus    There were no vitals filed for this visit.  Subjective Assessment - 03/31/17 1352    Subjective  Pt with no new complaints                      [facility] Adult PT Treatment/Exercise - 03/31/17 0001      Exercises   Exercises  Knee/Hip      Knee/Hip Exercises: Standing   Hip Flexion  20 reps;Knee bent    Hip Abduction  20 reps    Functional Squat  15 reps    Stairs  3 steps, 4 in x4; 2 steps, 6 in x3 , one hand rail     Gait Training  Gait: 35 ft x8 practice for heel/toe gait    Other Standing Knee Exercises  1/2  Tandem Stance 30 sec x2 bil; Rhomberg stace wtih head turns L/R x15;  Lateral stepping with weight shifting x20;        Knee/Hip Exercises: Seated   Long Arc Quad  20 reps    Other Seated Knee/Hip Exercises  x20    Sit to [employer]  10 reps             PT Education - 03/31/17 1353    Education provided  Yes    Education Details  HEP for LE Strength. Pt instructed not to perform balance exercises at home.     Person(s) Educated  Patient    Methods  Explanation    Comprehension  Verbalized understanding       PT Short Term Goals - 03/26/17 1010      PT SHORT TERM GOAL #1   Title  verbalize understanding of fall prevention strategies    Status  New    Target Date  04/23/17      PT SHORT TERM GOAL #2   Title  improve BERG balance score to >/=45/56 for decreased fall risk  Status  New    Target Date  04/23/17      PT SHORT TERM GOAL #3   Title  improve timed up and go to < 15 sec for improved balance    Status  New    Target Date  04/23/17      PT SHORT TERM GOAL #4   Title  amb > 350' with LRAD modified independent for improved function    Status  New    Target Date  04/23/17        PT Long Term Goals - 03/26/17 1012      PT LONG TERM GOAL #1   Title  independent with HEP    Status  New    Target Date  05/21/17      PT LONG TERM GOAL #2   Title  improve BERG balance score to >/= 48/56 for improved balance    Status  New    Target Date  05/21/17      PT LONG TERM GOAL #3   Title  improve DGI to >/= 20/24 for improved mobility and decreased fall risk    Status  New    Target Date  05/21/17      PT LONG TERM GOAL #4   Title  improve timed up and go to < 13 sec for decreased fall risk    Status  New    Target Date  05/21/17            Plan - 03/31/17 1355    Clinical Impression Statement  Pt given HEP for LE strength today. Balance exercises performed for static and dynamic balance. Pt requires max cuing to perform activities. She has unsteadiness,  but no LOB today. Recommended that pt walk indoors in house vs outdoors (pt asking if she should walk outdoors_). Also discussed getting rid of throw rugs at home, for decreased fall risk.     Rehab Potential  Good    PT Frequency  2x / week    PT Duration  8 weeks    PT Treatment/Interventions  ADLs/Self Care Home Management;Canalith Repostioning;Electrical Stimulation;Cryotherapy;Moist Heat;DME Instruction;Gait training;Stair training;Functional mobility training;Therapeutic activities;Therapeutic exercise;Patient/family education;Neuromuscular re-education;Balance training;Vestibular    PT Next Visit Plan  establish HEP for strengthening and standing balance activities (?OTAGO), may need AD due to multiple falls, needs compliant surface and dynamic gait activities; static activities with head turns and EO/EC    Consulted and Agree with Plan of Care  Patient       Patient will benefit from skilled therapeutic intervention in order to improve the following deficits and impairments:  Abnormal gait, Decreased mobility, Decreased strength, Decreased balance, Difficulty walking, Postural dysfunction  Visit Diagnosis: Unsteadiness on feet  Repeated falls     Problem List Patient Active Problem List   Diagnosis Date Noted  . Trochanteric bursitis of right hip 03/19/2017  . Rib pain on right side 03/19/2017  . Forgetfulness 03/10/2017  . Frequent falls 01/25/2017  . Gastroesophageal reflux disease without esophagitis 10/12/2016  . Osteopenia 09/17/2016  . Dysphagia 09/14/2016  . Bipolar 2 disorder ([place]) 09/14/2016  . Hypothyroidism 09/14/2016  . New onset of headaches after age 33 05/27/2016  . Positional vertigo 09/08/2013  . Bilateral carotid artery disease ([place]) 12/20/2012  . PVC's (premature ventricular contractions) 12/20/2012  . Dyslipidemia 12/20/2012    Lyndee Hensen, PT, DPT 1:57 PM  03/31/17   [facility] [place], [place], [address] Phone: [phone]  Fax:  [phone]  Name: Shari Horn MRN: [id number] Date of Birth: 22-May-1940

## 2017-03-31 NOTE — Patient Instructions (Signed)
Seated march Seated LAQ Standing Hip Abd Mini Squats  All x20 , 2x/day

## 2017-04-05 ENCOUNTER — Encounter: Payer: Self-pay | Admitting: Physical Therapy

## 2017-04-05 ENCOUNTER — Ambulatory Visit: Payer: Medicare Other | Admitting: Physical Therapy

## 2017-04-05 DIAGNOSIS — R2681 Unsteadiness on feet: Secondary | ICD-10-CM | POA: Diagnosis not present

## 2017-04-05 DIAGNOSIS — R296 Repeated falls: Secondary | ICD-10-CM | POA: Diagnosis not present

## 2017-04-05 NOTE — Therapy (Signed)
[facility] [address] [place], [place], [address] Phone: [phone]   Fax:  [phone]  Physical Therapy Treatment  Patient Details  Name: Shari Horn MRN: [id number] Date of Birth: September 30, 1940 Referring Provider: Dr. Dimas Chyle   Encounter Date: 04/05/2017  PT End of Session - 04/05/17 1608    Visit Number  3    Number of Visits  16    Date for PT Re-Evaluation  05/21/17    Authorization Type  [employer] $40 copay    PT Start Time  1220    PT Stop Time  1307    PT Time Calculation (min)  47 min    Equipment Utilized During Treatment  Gait belt    Activity Tolerance  Patient tolerated treatment well;Treatment limited secondary to medical complications (Comment)    Behavior During Therapy  [facility] for tasks assessed/performed       Past Medical History:  Diagnosis Date  . Carotid artery disease ([place])   . Depression   . Dyslipidemia   . Hyperlipidemia   . Hypothyroidism   . 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 PVC's (premature ventricular contractions)     Past Surgical History:  Procedure Laterality Date  . BREAST BIOPSY    . BREAST EXCISIONAL BIOPSY Left 1990  . BREAST EXCISIONAL BIOPSY Left 1996  . Carotid Doppler  12/2011   0-49% RICA stenosis, 92-42% LICA stenosis  . TRANSTHORACIC ECHOCARDIOGRAM  12/2011   EF 68-34%, grade 1 diastolic dysfunction; trivial AV regurg; calcified MV annulus    There were no vitals filed for this visit.  Subjective Assessment - 04/05/17 1231    Subjective  Pt with no new complaints                      [facility] Adult PT Treatment/Exercise - 04/05/17 1232      Exercises   Exercises  Knee/Hip      Knee/Hip Exercises: Aerobic   Stationary Bike  L1 x6 min      Knee/Hip Exercises: Standing   Hip Flexion  20 reps;Knee bent    Hip Flexion Limitations  2lb    Hip Abduction  20 reps    Functional Squat  --    Stairs  step ups x10 bil; 4 in; one hand rail     Gait Training  Gait:  35 ft x10 with head turns; 35 ftx6 heel to toe pattern    Other Standing Knee Exercises  1/2 Tandem Stance head turns 2x10  bil; Rhomberg stace wtih head turns L/R x10;        Knee/Hip Exercises: Seated   Long Arc Quad  20 reps    Long Arc Quad Weight  2 lbs.    Other Seated Knee/Hip Exercises  --    Marching  20 reps    Sit to [employer]  10 reps             PT Education - 04/05/17 1608    Education provided  Yes    Education Details  HEP for LE strengthening     Person(s) Educated  Patient    Methods  Explanation    Comprehension  Verbalized understanding       PT Short Term Goals - 03/26/17 1010      PT SHORT TERM GOAL #1   Title  verbalize understanding of fall prevention strategies    Status  New    Target Date  04/23/17  PT SHORT TERM GOAL #2   Title  improve BERG balance score to >/=45/56 for decreased fall risk    Status  New    Target Date  04/23/17      PT SHORT TERM GOAL #3   Title  improve timed up and go to < 15 sec for improved balance    Status  New    Target Date  04/23/17      PT SHORT TERM GOAL #4   Title  amb > 350' with LRAD modified independent for improved function    Status  New    Target Date  04/23/17        PT Long Term Goals - 03/26/17 1012      PT LONG TERM GOAL #1   Title  independent with HEP    Status  New    Target Date  05/21/17      PT LONG TERM GOAL #2   Title  improve BERG balance score to >/= 48/56 for improved balance    Status  New    Target Date  05/21/17      PT LONG TERM GOAL #3   Title  improve DGI to >/= 20/24 for improved mobility and decreased fall risk    Status  New    Target Date  05/21/17      PT LONG TERM GOAL #4   Title  improve timed up and go to < 13 sec for decreased fall risk    Status  New    Target Date  05/21/17            Plan - 04/05/17 1609    Clinical Impression Statement  Focus on LE strength and balance. Pt with improved ability for head turns with static stance. She has  decreased ability for this, with gait, but no LOB. She has difficulty following commands with head turns and with step ups, to perform correct sequencing and timing. She asked several times if she should perform balance exercises at home, pt repeatedly told not to do so. She will benefit from contiued work on Dietitian.     Rehab Potential  Good    PT Frequency  2x / week    PT Duration  8 weeks    PT Treatment/Interventions  ADLs/Self Care Home Management;Canalith Repostioning;Electrical Stimulation;Cryotherapy;Moist Heat;DME Instruction;Gait training;Stair training;Functional mobility training;Therapeutic activities;Therapeutic exercise;Patient/family education;Neuromuscular re-education;Balance training;Vestibular    PT Next Visit Plan  establish HEP for strengthening and standing balance activities (?OTAGO), may need AD due to multiple falls, needs compliant surface and dynamic gait activities; static activities with head turns and EO/EC    Consulted and Agree with Plan of Care  Patient       Patient will benefit from skilled therapeutic intervention in order to improve the following deficits and impairments:  Abnormal gait, Decreased mobility, Decreased strength, Decreased balance, Difficulty walking, Postural dysfunction  Visit Diagnosis: Unsteadiness on feet  Repeated falls     Problem List Patient Active Problem List   Diagnosis Date Noted  . Trochanteric bursitis of right hip 03/19/2017  . Rib pain on right side 03/19/2017  . Forgetfulness 03/10/2017  . Frequent falls 01/25/2017  . Gastroesophageal reflux disease without esophagitis 10/12/2016  . Osteopenia 09/17/2016  . Dysphagia 09/14/2016  . Bipolar 2 disorder ([place]) 09/14/2016  . Hypothyroidism 09/14/2016  . New onset of headaches after age 11 05/27/2016  . Positional vertigo 09/08/2013  . Bilateral carotid artery disease ([place]) 12/20/2012  . PVC's (premature  ventricular contractions) 12/20/2012  . Dyslipidemia  12/20/2012   Lyndee Hensen, PT, DPT 4:11 PM  04/05/17    [facility] [place], [place], [address] Phone: [phone]   Fax:  [phone]  Name: Shari Horn MRN: [id number] Date of Birth: May 04, 1940

## 2017-04-12 ENCOUNTER — Ambulatory Visit: Payer: Medicare Other | Admitting: Physical Therapy

## 2017-04-12 DIAGNOSIS — R2681 Unsteadiness on feet: Secondary | ICD-10-CM

## 2017-04-12 DIAGNOSIS — R296 Repeated falls: Secondary | ICD-10-CM | POA: Diagnosis not present

## 2017-04-12 NOTE — Therapy (Signed)
[facility] [address] [place], [place], [address] Phone: [phone]   Fax:  [phone]  Physical Therapy Treatment  Patient Details  Name: Shari Horn MRN: [id number] Date of Birth: 10-30-1940 Referring Provider: Dr. Dimas Chyle   Encounter Date: 04/12/2017  PT End of Session - 04/12/17 1406    Visit Number  4    Number of Visits  16    Date for PT Re-Evaluation  05/21/17    Authorization Type  [employer] $40 copay    PT Start Time  1218    PT Stop Time  1302    PT Time Calculation (min)  44 min    Equipment Utilized During Treatment  Gait belt    Activity Tolerance  Patient tolerated treatment well;Treatment limited secondary to medical complications (Comment)    Behavior During Therapy  [facility] for tasks assessed/performed       Past Medical History:  Diagnosis Date  . Carotid artery disease ([place])   . Depression   . Dyslipidemia   . Hyperlipidemia   . Hypothyroidism   . 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 PVC's (premature ventricular contractions)     Past Surgical History:  Procedure Laterality Date  . BREAST BIOPSY    . BREAST EXCISIONAL BIOPSY Left 1990  . BREAST EXCISIONAL BIOPSY Left 1996  . Carotid Doppler  12/2011   0-49% RICA stenosis, 51-76% LICA stenosis  . TRANSTHORACIC ECHOCARDIOGRAM  12/2011   EF 16-07%, grade 1 diastolic dysfunction; trivial AV regurg; calcified MV annulus    There were no vitals filed for this visit.  Subjective Assessment - 04/12/17 1405    Subjective  Pt states that she has been doing her HEP     Currently in Pain?  No/denies                No data recorded       [facility] Adult PT Treatment/Exercise - 04/12/17 1228      Exercises   Exercises  Knee/Hip      Knee/Hip Exercises: Aerobic   Stationary Bike  L1 x8 min      Knee/Hip Exercises: Standing   Heel Raises  20 reps    Hip Flexion  20 reps;Knee bent    Hip Flexion Limitations  2lb    Hip Abduction  20 reps    Stairs  --    Gait Training  Gait: 35 ft x8 with head turns; 35 ftx8 heel to toe pattern; bkwd wa;king 35 st x4    Other Standing Knee Exercises   Tandem Stance head turns 2x10  bil; [place]berg stace wtih head turns L/R 2 x10; weight shifts L/R and A/P x30 each;        Knee/Hip Exercises: Seated   Long Arc Quad  --    Long Arc [employer]  --    Marching  --    Sit to [employer]  --             PT Education - 04/12/17 1405    Education provided  Yes    Education Details  HEP for LE strengthening, pt repeatedly asks about doing balance exercises at home, has been discouraged from doing so.     Person(s) Educated  Patient    Methods  Explanation    Comprehension  Verbalized understanding       PT Short Term Goals - 03/26/17 1010      PT SHORT TERM GOAL #1  Title  verbalize understanding of fall prevention strategies    Status  New    Target Date  04/23/17      PT SHORT TERM GOAL #2   Title  improve BERG balance score to >/=45/56 for decreased fall risk    Status  New    Target Date  04/23/17      PT SHORT TERM GOAL #3   Title  improve timed up and go to < 15 sec for improved balance    Status  New    Target Date  04/23/17      PT SHORT TERM GOAL #4   Title  amb > 350' with LRAD modified independent for improved function    Status  New    Target Date  04/23/17        PT Long Term Goals - 03/26/17 1012      PT LONG TERM GOAL #1   Title  independent with HEP    Status  New    Target Date  05/21/17      PT LONG TERM GOAL #2   Title  improve BERG balance score to >/= 48/56 for improved balance    Status  New    Target Date  05/21/17      PT LONG TERM GOAL #3   Title  improve DGI to >/= 20/24 for improved mobility and decreased fall risk    Status  New    Target Date  05/21/17      PT LONG TERM GOAL #4   Title  improve timed up and go to < 13 sec for decreased fall risk    Status  New    Target Date  05/21/17            Plan - 04/12/17 1408     Clinical Impression Statement  Pt challenged with most all activities today. She requires mod-max cueing for performing, and requires close supervision or contact guard when perfoming balance activities. She has diffiulty following directions for sequencing and for L/R directions. Pt with noted anterior COG with gait, has diffiuclty maintaining Heel to toe gait pattern, especially with head turns. Plan to progress balance as tolerated.     Clinical Presentation  Evolving    Clinical Decision Making  Moderate    Rehab Potential  Good    PT Treatment/Interventions  ADLs/Self Care Home Management;Canalith Repostioning;Electrical Stimulation;Cryotherapy;Moist Heat;DME Instruction;Gait training;Stair training;Functional mobility training;Therapeutic activities;Therapeutic exercise;Patient/family education;Neuromuscular re-education;Balance training;Vestibular    Consulted and Agree with Plan of Care  Patient       Patient will benefit from skilled therapeutic intervention in order to improve the following deficits and impairments:  Abnormal gait, Decreased mobility, Decreased strength, Decreased balance, Difficulty walking, Postural dysfunction  Visit Diagnosis: Unsteadiness on feet  Repeated falls     Problem List Patient Active Problem List   Diagnosis Date Noted  . Trochanteric bursitis of right hip 03/19/2017  . Rib pain on right side 03/19/2017  . Forgetfulness 03/10/2017  . Frequent falls 01/25/2017  . Gastroesophageal reflux disease without esophagitis 10/12/2016  . Osteopenia 09/17/2016  . Dysphagia 09/14/2016  . Bipolar 2 disorder ([place]) 09/14/2016  . Hypothyroidism 09/14/2016  . New onset of headaches after age 9 05/27/2016  . Positional vertigo 09/08/2013  . Bilateral carotid artery disease ([place]) 12/20/2012  . PVC's (premature ventricular contractions) 12/20/2012  . Dyslipidemia 12/20/2012   Lyndee Hensen, PT, DPT 2:15 PM  04/12/17   [facility] [phone]  [place], [place], [address] Phone: [phone]   Fax:  [phone]  Name: Shari Horn MRN: [id number] Date of Birth: April 22, 1940

## 2017-04-15 ENCOUNTER — Encounter: Payer: Self-pay | Admitting: Physical Therapy

## 2017-04-15 ENCOUNTER — Ambulatory Visit: Payer: Medicare Other | Admitting: Physical Therapy

## 2017-04-15 DIAGNOSIS — R2681 Unsteadiness on feet: Secondary | ICD-10-CM

## 2017-04-15 DIAGNOSIS — R296 Repeated falls: Secondary | ICD-10-CM | POA: Diagnosis not present

## 2017-04-15 NOTE — Therapy (Signed)
[facility] [address] [place], [place], [address] Phone: [phone]   Fax:  [phone]  Physical Therapy Treatment  Patient Details  Name: Shari Horn MRN: [id number] Date of Birth: [birth year] Referring Provider: Dr. Dimas Chyle   Encounter Date: 04/15/2017  PT End of Session - 04/15/17 1413    Visit Number  5    Number of Visits  16    Date for PT Re-Evaluation  05/21/17    Authorization Type  [employer] $40 copay    PT Start Time  1302    PT Stop Time  1350    PT Time Calculation (min)  48 min    Equipment Utilized During Treatment  Gait belt    Activity Tolerance  Patient tolerated treatment well;Treatment limited secondary to medical complications (Comment)    Behavior During Therapy  [facility] for tasks assessed/performed       Past Medical History:  Diagnosis Date  . Carotid artery disease ([place])   . Depression   . Dyslipidemia   . Hyperlipidemia   . Hypothyroidism   . 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 PVC's (premature ventricular contractions)     Past Surgical History:  Procedure Laterality Date  . BREAST BIOPSY    . BREAST EXCISIONAL BIOPSY Left 1990  . BREAST EXCISIONAL BIOPSY Left 1996  . Carotid Doppler  12/2011   0-49% RICA stenosis, 95-28% LICA stenosis  . TRANSTHORACIC ECHOCARDIOGRAM  12/2011   EF 41-32%, grade 1 diastolic dysfunction; trivial AV regurg; calcified MV annulus    There were no vitals filed for this visit.             No data recorded       [facility] Adult PT Treatment/Exercise - 04/15/17 1309      Exercises   Exercises  Knee/Hip      Knee/Hip Exercises: Aerobic   Stationary Bike  L1 x8 min      Knee/Hip Exercises: Standing   Heel Raises  --    Hip Flexion  20 reps;Knee bent    Hip Flexion Limitations  2lb    Hip Abduction  20 reps    Abduction Limitations  2lb    Gait Training  35 ftx8 quick;  bkwd walking 35 st x4; contact guard for both ;     Other Standing Knee Exercises    Tandem Stance head turns 2x10  bil; Regular stance eyes closed 45 sec x2;  weight shifts L/R and A/P x30 each;  posterior stepping with weight shifts x20;  looking over shoulder x10 bil;       Knee/Hip Exercises: Seated   Long Arc Quad  20 reps    Long Arc Quad Weight  2 lbs.    Sit to [employer]  10 reps             PT Education - 04/15/17 1413    Education provided  Yes    Education Details  HEP, balane, safety with mobility, cane instruction     Person(s) Educated  Patient    Methods  Explanation    Comprehension  Verbalized understanding;Need further instruction       PT Short Term Goals - 03/26/17 1010      PT SHORT TERM GOAL #1   Title  verbalize understanding of fall prevention strategies    Status  New    Target Date  04/23/17      PT SHORT TERM GOAL #2   Title  improve BERG balance score to >/=45/56 for decreased fall risk    Status  New    Target Date  04/23/17      PT SHORT TERM GOAL #3   Title  improve timed up and go to < 15 sec for improved balance    Status  New    Target Date  04/23/17      PT SHORT TERM GOAL #4   Title  amb > 350' with LRAD modified independent for improved function    Status  New    Target Date  04/23/17        PT Long Term Goals - 03/26/17 1012      PT LONG TERM GOAL #1   Title  independent with HEP    Status  New    Target Date  05/21/17      PT LONG TERM GOAL #2   Title  improve BERG balance score to >/= 48/56 for improved balance    Status  New    Target Date  05/21/17      PT LONG TERM GOAL #3   Title  improve DGI to >/= 20/24 for improved mobility and decreased fall risk    Status  New    Target Date  05/21/17      PT LONG TERM GOAL #4   Title  improve timed up and go to < 13 sec for decreased fall risk    Status  New    Target Date  05/21/17            Plan - 04/15/17 1416    Clinical Impression Statement  Pt progressed with balance activities today. She is challenged with and has loss of balance with  posterior weight shifts today, and backwards walking. Increased difficulty holding COG with increased difficulty of exercises. She also has difficult time following commands for activities. She does demonstrate improved ambulation mechanics, with less cuing needed today, and ability for increased speed. She will benefit from continued practice with stability.     Rehab Potential  Good    PT Treatment/Interventions  ADLs/Self Care Home Management;Canalith Repostioning;Electrical Stimulation;Cryotherapy;Moist Heat;DME Instruction;Gait training;Stair training;Functional mobility training;Therapeutic activities;Therapeutic exercise;Patient/family education;Neuromuscular re-education;Balance training;Vestibular    Consulted and Agree with Plan of Care  Patient       Patient will benefit from skilled therapeutic intervention in order to improve the following deficits and impairments:  Abnormal gait, Decreased mobility, Decreased strength, Decreased balance, Difficulty walking, Postural dysfunction  Visit Diagnosis: Unsteadiness on feet  Frequent falls     Problem List Patient Active Problem List   Diagnosis Date Noted  . Trochanteric bursitis of right hip 03/19/2017  . Rib pain on right side 03/19/2017  . Forgetfulness 03/10/2017  . Frequent falls 01/25/2017  . Gastroesophageal reflux disease without esophagitis 10/12/2016  . Osteopenia 09/17/2016  . Dysphagia 09/14/2016  . Bipolar 2 disorder ([place]) 09/14/2016  . Hypothyroidism 09/14/2016  . New onset of headaches after age 77 05/27/2016  . Positional vertigo 09/08/2013  . Bilateral carotid artery disease ([place]) 12/20/2012  . PVC's (premature ventricular contractions) 12/20/2012  . Dyslipidemia 12/20/2012   Lyndee Hensen, PT, DPT 3:37 PM  04/15/17    [facility] [place], [place], [address] Phone: [phone]   Fax:  [phone]  Name: Shari Horn MRN: [id number] Date of  Birth: [birth year]

## 2017-04-19 ENCOUNTER — Encounter: Payer: Self-pay | Admitting: Physical Therapy

## 2017-04-19 ENCOUNTER — Ambulatory Visit: Payer: Medicare Other | Admitting: Physical Therapy

## 2017-04-19 DIAGNOSIS — R296 Repeated falls: Secondary | ICD-10-CM | POA: Diagnosis not present

## 2017-04-19 DIAGNOSIS — R2681 Unsteadiness on feet: Secondary | ICD-10-CM

## 2017-04-19 NOTE — Therapy (Signed)
[facility] [address] [place], [place], [address] Phone: [phone]   Fax:  [phone]  Physical Therapy Treatment  Patient Details  Name: Shari Horn MRN: [id number] Date of Birth: [birth year] Referring Provider: Dr. Dimas Chyle   Encounter Date: 04/19/2017  PT End of Session - 04/19/17 1511    Visit Number  6    Number of Visits  16    Date for PT Re-Evaluation  05/21/17    Authorization Type  [employer] $40 copay    PT Start Time  1215    PT Stop Time  1305    PT Time Calculation (min)  50 min    Equipment Utilized During Treatment  Gait belt    Activity Tolerance  Patient tolerated treatment well;Treatment limited secondary to medical complications (Comment)    Behavior During Therapy  [facility] for tasks assessed/performed       Past Medical History:  Diagnosis Date  . Carotid artery disease ([place])   . Depression   . Dyslipidemia   . Hyperlipidemia   . Hypothyroidism   . 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 PVC's (premature ventricular contractions)     Past Surgical History:  Procedure Laterality Date  . BREAST BIOPSY    . BREAST EXCISIONAL BIOPSY Left 1990  . BREAST EXCISIONAL BIOPSY Left 1996  . Carotid Doppler  12/2011   0-49% RICA stenosis, 08-14% LICA stenosis  . TRANSTHORACIC ECHOCARDIOGRAM  12/2011   EF 48-18%, grade 1 diastolic dysfunction; trivial AV regurg; calcified MV annulus    There were no vitals filed for this visit.  Subjective Assessment - 04/19/17 1510    Subjective  Pt with no new complaints.     Currently in Pain?  No/denies                       [facility] Adult PT Treatment/Exercise - 04/19/17 0001      Knee/Hip Exercises: Aerobic   Stationary Bike  Ll1 x 6 min      Knee/Hip Exercises: Standing   Hip Flexion  20 reps;Knee bent    Hip Flexion Limitations  no UE support    Hip Abduction  20 reps    Abduction Limitations  2lb    Gait Training  35 ftx8 quick;  bkwd walking 35 st x4;  contact guard for both ; Walking with step over cones 35 ft x6; walking with navigation around cones 35 ft x6;     Other Standing Knee Exercises   Tandem Stance head turns 2x10  bil; Regular stance eyes closed 45 sec x2;  weight shifts L/R (AirEx) and A/P x30 each;  looking over shoulder x10 bil;     Other Standing Knee Exercises  Toe taps on 6 in step, no UE support x20;       Knee/Hip Exercises: Seated   Sit to Sand  10 reps;without UE support             PT Education - 04/19/17 1511    Education provided  Yes    Education Details  Home safety, HEP for LE strength only, no balance exercises at home.     Person(s) Educated  Patient    Methods  Explanation    Comprehension  Verbalized understanding       PT Short Term Goals - 03/26/17 1010      PT SHORT TERM GOAL #1   Title  verbalize understanding of fall prevention strategies  Status  New    Target Date  04/23/17      PT SHORT TERM GOAL #2   Title  improve BERG balance score to >/=45/56 for decreased fall risk    Status  New    Target Date  04/23/17      PT SHORT TERM GOAL #3   Title  improve timed up and go to < 15 sec for improved balance    Status  New    Target Date  04/23/17      PT SHORT TERM GOAL #4   Title  amb > 350' with LRAD modified independent for improved function    Status  New    Target Date  04/23/17        PT Long Term Goals - 03/26/17 1012      PT LONG TERM GOAL #1   Title  independent with HEP    Status  New    Target Date  05/21/17      PT LONG TERM GOAL #2   Title  improve BERG balance score to >/= 48/56 for improved balance    Status  New    Target Date  05/21/17      PT LONG TERM GOAL #3   Title  improve DGI to >/= 20/24 for improved mobility and decreased fall risk    Status  New    Target Date  05/21/17      PT LONG TERM GOAL #4   Title  improve timed up and go to < 13 sec for decreased fall risk    Status  New    Target Date  05/21/17            Plan -  04/19/17 1513    Clinical Impression Statement  Pt with no LOB today, but does have tendency for backward sway in standing. Pt challenged with most all activities today, especially with head turns. She also has much difficulty with motor planning for stepping up over cones, which improved some after education and practice, but still used much slower speed than regular walking speed. Pt has difficulty following directions and cues during activities . Will benefit from continued care.     Rehab Potential  Good    PT Treatment/Interventions  ADLs/Self Care Home Management;Canalith Repostioning;Electrical Stimulation;Cryotherapy;Moist Heat;DME Instruction;Gait training;Stair training;Functional mobility training;Therapeutic activities;Therapeutic exercise;Patient/family education;Neuromuscular re-education;Balance training;Vestibular    Consulted and Agree with Plan of Care  Patient       Patient will benefit from skilled therapeutic intervention in order to improve the following deficits and impairments:  Abnormal gait, Decreased mobility, Decreased strength, Decreased balance, Difficulty walking, Postural dysfunction  Visit Diagnosis: Unsteadiness on feet  Frequent falls     Problem List Patient Active Problem List   Diagnosis Date Noted  . Trochanteric bursitis of right hip 03/19/2017  . Rib pain on right side 03/19/2017  . Forgetfulness 03/10/2017  . Frequent falls 01/25/2017  . Gastroesophageal reflux disease without esophagitis 10/12/2016  . Osteopenia 09/17/2016  . Dysphagia 09/14/2016  . Bipolar 2 disorder ([place]) 09/14/2016  . Hypothyroidism 09/14/2016  . New onset of headaches after age 77 05/27/2016  . Positional vertigo 09/08/2013  . Bilateral carotid artery disease ([place]) 12/20/2012  . PVC's (premature ventricular contractions) 12/20/2012  . Dyslipidemia 12/20/2012   Lyndee Hensen, PT, DPT 3:16 PM  04/19/17    [facility] [place], [place], [address] Phone: [phone]   Fax:  [phone]  Name:  SHERIDAN HEW MRN: [id number] Date of Birth: Jun 19, 1940

## 2017-04-21 ENCOUNTER — Ambulatory Visit: Payer: Medicare Other | Admitting: Physical Therapy

## 2017-04-21 DIAGNOSIS — R296 Repeated falls: Secondary | ICD-10-CM

## 2017-04-21 DIAGNOSIS — R2681 Unsteadiness on feet: Secondary | ICD-10-CM

## 2017-04-21 NOTE — Therapy (Signed)
[facility] [address] [place], [place], [address] Phone: [phone]   Fax:  [phone]  Physical Therapy Treatment  Patient Details  Name: Shari Horn MRN: [id number] Date of Birth: 1940/10/11 Referring Provider: Dr. Dimas Chyle   Encounter Date: 04/21/2017  PT End of Session - 04/21/17 1520    Visit Number  7    Number of Visits  16    Date for PT Re-Evaluation  05/21/17    Authorization Type  [employer] $40 copay    PT Start Time  1430    PT Stop Time  1517    PT Time Calculation (min)  47 min    Equipment Utilized During Treatment  Gait belt    Activity Tolerance  Patient tolerated treatment well;Treatment limited secondary to medical complications (Comment)    Behavior During Therapy  [facility] for tasks assessed/performed       Past Medical History:  Diagnosis Date  . Carotid artery disease ([place])   . Depression   . Dyslipidemia   . Hyperlipidemia   . Hypothyroidism   . 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 PVC's (premature ventricular contractions)     Past Surgical History:  Procedure Laterality Date  . BREAST BIOPSY    . BREAST EXCISIONAL BIOPSY Left 1990  . BREAST EXCISIONAL BIOPSY Left 1996  . Carotid Doppler  12/2011   0-49% RICA stenosis, 21-19% LICA stenosis  . TRANSTHORACIC ECHOCARDIOGRAM  12/2011   EF 41-74%, grade 1 diastolic dysfunction; trivial AV regurg; calcified MV annulus    There were no vitals filed for this visit.  Subjective Assessment - 04/21/17 1437    Subjective  Pt has been doing HEP for LE strength. She moved her throw rugs at home.                        [facility] Adult PT Treatment/Exercise - 04/21/17 1524      Knee/Hip Exercises: Aerobic   Stationary Bike  Ll1 x 8 min      Knee/Hip Exercises: Standing   Hip Flexion  20 reps;Knee bent    Hip Flexion Limitations  no UE support    Hip Abduction  20 reps    Abduction Limitations  2lb    Gait Training  35 ftx8 quick;  bkwd walking  35 st x4; Walking with head turns 35 ft x8; contact guard for all ; side stepping 25 ft x4;     Other Standing Knee Exercises   Tandem Stance head turns 2x10  bil; Regular stance eyes closed 45 sec x2;  weight shifts L/R and A/P x30 each on AirEx;      Other Standing Knee Exercises  Toe taps on 6 in step, no UE support x20; up/down 5 steps x5;       Knee/Hip Exercises: Seated   Sit to Sand  10 reps;without UE support             PT Education - 04/21/17 1438    Education provided  Yes    Education Details  HEP, home safety,     Person(s) Educated  Patient    Methods  Explanation    Comprehension  Verbalized understanding;Need further instruction       PT Short Term Goals - 03/26/17 1010      PT SHORT TERM GOAL #1   Title  verbalize understanding of fall prevention strategies    Status  New  Target Date  04/23/17      PT SHORT TERM GOAL #2   Title  improve BERG balance score to >/=45/56 for decreased fall risk    Status  New    Target Date  04/23/17      PT SHORT TERM GOAL #3   Title  improve timed up and go to < 15 sec for improved balance    Status  New    Target Date  04/23/17      PT SHORT TERM GOAL #4   Title  amb > 350' with LRAD modified independent for improved function    Status  New    Target Date  04/23/17        PT Long Term Goals - 03/26/17 1012      PT LONG TERM GOAL #1   Title  independent with HEP    Status  New    Target Date  05/21/17      PT LONG TERM GOAL #2   Title  improve BERG balance score to >/= 48/56 for improved balance    Status  New    Target Date  05/21/17      PT LONG TERM GOAL #3   Title  improve DGI to >/= 20/24 for improved mobility and decreased fall risk    Status  New    Target Date  05/21/17      PT LONG TERM GOAL #4   Title  improve timed up and go to < 13 sec for decreased fall risk    Status  New    Target Date  05/21/17            Plan - 04/21/17 1522    Clinical Impression Statement  Pt showing  mild improvements in balance with exercises today. She has improved ability for weight shifts and sway without lob posteriorly, and able to perform on AirEx today. She had improved ability for maintaining speed with head turns, but requires max cuing to do so. Plan to continue balance and gait progressions.     Rehab Potential  Good    PT Treatment/Interventions  ADLs/Self Care Home Management;Canalith Repostioning;Electrical Stimulation;Cryotherapy;Moist Heat;DME Instruction;Gait training;Stair training;Functional mobility training;Therapeutic activities;Therapeutic exercise;Patient/family education;Neuromuscular re-education;Balance training;Vestibular    Consulted and Agree with Plan of Care  Patient       Patient will benefit from skilled therapeutic intervention in order to improve the following deficits and impairments:  Abnormal gait, Decreased mobility, Decreased strength, Decreased balance, Difficulty walking, Postural dysfunction  Visit Diagnosis: Unsteadiness on feet  Frequent falls     Problem List Patient Active Problem List   Diagnosis Date Noted  . Trochanteric bursitis of right hip 03/19/2017  . Rib pain on right side 03/19/2017  . Forgetfulness 03/10/2017  . Frequent falls 01/25/2017  . Gastroesophageal reflux disease without esophagitis 10/12/2016  . Osteopenia 09/17/2016  . Dysphagia 09/14/2016  . Bipolar 2 disorder ([place]) 09/14/2016  . Hypothyroidism 09/14/2016  . New onset of headaches after age 63 05/27/2016  . Positional vertigo 09/08/2013  . Bilateral carotid artery disease ([place]) 12/20/2012  . PVC's (premature ventricular contractions) 12/20/2012  . Dyslipidemia 12/20/2012   Lyndee Hensen, PT, DPT 3:27 PM  04/21/17    [place], [place], [address] Phone: [phone]   Fax:  [phone]  Name: Shari Horn MRN: [id number] Date of Birth: 08/19/1940

## 2017-04-28 ENCOUNTER — Other Ambulatory Visit: Payer: Self-pay | Admitting: Family Medicine

## 2017-04-28 ENCOUNTER — Other Ambulatory Visit: Payer: Self-pay | Admitting: Internal Medicine

## 2017-04-28 NOTE — Telephone Encounter (Signed)
REFILL 

## 2017-04-29 ENCOUNTER — Encounter: Payer: Self-pay | Admitting: Physical Therapy

## 2017-04-29 ENCOUNTER — Ambulatory Visit: Payer: Medicare Other | Admitting: Physical Therapy

## 2017-04-29 DIAGNOSIS — R2681 Unsteadiness on feet: Secondary | ICD-10-CM

## 2017-04-29 DIAGNOSIS — R296 Repeated falls: Secondary | ICD-10-CM

## 2017-04-29 NOTE — Therapy (Signed)
[facility] [address] [place], [place], [address] Phone: [phone]   Fax:  [phone]  Physical Therapy Treatment  Patient Details  Name: Shari Horn MRN: [id number] Date of Birth: 07/27/1940 Referring Provider: Dr. Dimas Chyle   Encounter Date: 04/29/2017  PT End of Session - 04/29/17 1450    Visit Number  8    Number of Visits  16    Date for PT Re-Evaluation  05/21/17    Authorization Type  [employer] $40 copay    PT Start Time  1220    PT Stop Time  1300    PT Time Calculation (min)  40 min    Equipment Utilized During Treatment  Gait belt    Activity Tolerance  Patient tolerated treatment well;Treatment limited secondary to medical complications (Comment)    Behavior During Therapy  [facility] for tasks assessed/performed       Past Medical History:  Diagnosis Date  . Carotid artery disease ([place])   . Depression   . Dyslipidemia   . Hyperlipidemia   . Hypothyroidism   . 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 PVC's (premature ventricular contractions)     Past Surgical History:  Procedure Laterality Date  . BREAST BIOPSY    . BREAST EXCISIONAL BIOPSY Left 1990  . BREAST EXCISIONAL BIOPSY Left 1996  . Carotid Doppler  12/2011   0-49% RICA stenosis, 95-28% LICA stenosis  . TRANSTHORACIC ECHOCARDIOGRAM  12/2011   EF 41-32%, grade 1 diastolic dysfunction; trivial AV regurg; calcified MV annulus    There were no vitals filed for this visit.  Subjective Assessment - 04/29/17 1449    Subjective  Pt states that she feels LEs are getting stronger and balance is improving. She is walking to end of driveway and back, but not walking out on road yet.                        [facility] Adult PT Treatment/Exercise - 04/29/17 1234      Knee/Hip Exercises: Aerobic   Stationary Bike  --      Knee/Hip Exercises: Standing   Hip Flexion  20 reps;Knee bent    Hip Flexion Limitations  no UE support    Hip Abduction  20 reps    Abduction Limitations  2lb    Gait Training  35 ftx8 quick;  bkwd walking 35 st x4; Walking over cones and around cones 35 ft x6 each;     Other Standing Knee Exercises    weight shifts L/R and A/P x30 each on AirEx;      Other Standing Knee Exercises  Toe taps on 6 in step, no UE support x20; up/down 6 in step x10 each bil;              PT Education - 04/29/17 1449    Education provided  Yes    Education Details  safety with HEP, Gait mechanics     Person(s) Educated  Patient    Methods  Explanation;Demonstration;Tactile cues;Verbal cues    Comprehension  Verbalized understanding;Need further instruction       PT Short Term Goals - 03/26/17 1010      PT SHORT TERM GOAL #1   Title  verbalize understanding of fall prevention strategies    Status  New    Target Date  04/23/17      PT SHORT TERM GOAL #2   Title  improve BERG balance score  to >/=45/56 for decreased fall risk    Status  New    Target Date  04/23/17      PT SHORT TERM GOAL #3   Title  improve timed up and go to < 15 sec for improved balance    Status  New    Target Date  04/23/17      PT SHORT TERM GOAL #4   Title  amb > 350' with LRAD modified independent for improved function    Status  New    Target Date  04/23/17        PT Long Term Goals - 03/26/17 1012      PT LONG TERM GOAL #1   Title  independent with HEP    Status  New    Target Date  05/21/17      PT LONG TERM GOAL #2   Title  improve BERG balance score to >/= 48/56 for improved balance    Status  New    Target Date  05/21/17      PT LONG TERM GOAL #3   Title  improve DGI to >/= 20/24 for improved mobility and decreased fall risk    Status  New    Target Date  05/21/17      PT LONG TERM GOAL #4   Title  improve timed up and go to < 13 sec for decreased fall risk    Status  New    Target Date  05/21/17            Plan - 04/29/17 1452    Clinical Impression Statement  Pt with improved ability for gait speed and consistent  heel strike. She also has improved ability for navigation over and around cones, with improved motor planning, and less change in speed. She is able to consistently perform posterior weight shifts during sessions without LOB. She requires cuing with most balance activities, to slow movement for improved stability.     PT Treatment/Interventions  ADLs/Self Care Home Management;Canalith Repostioning;Electrical Stimulation;Cryotherapy;Moist Heat;DME Instruction;Gait training;Stair training;Functional mobility training;Therapeutic activities;Therapeutic exercise;Patient/family education;Neuromuscular re-education;Balance training;Vestibular       Patient will benefit from skilled therapeutic intervention in order to improve the following deficits and impairments:  Abnormal gait, Decreased mobility, Decreased strength, Decreased balance, Difficulty walking, Postural dysfunction  Visit Diagnosis: Unsteadiness on feet  Frequent falls     Problem List Patient Active Problem List   Diagnosis Date Noted  . Trochanteric bursitis of right hip 03/19/2017  . Rib pain on right side 03/19/2017  . Forgetfulness 03/10/2017  . Frequent falls 01/25/2017  . Gastroesophageal reflux disease without esophagitis 10/12/2016  . Osteopenia 09/17/2016  . Dysphagia 09/14/2016  . Bipolar 2 disorder ([place]) 09/14/2016  . Hypothyroidism 09/14/2016  . New onset of headaches after age 34 05/27/2016  . Positional vertigo 09/08/2013  . Bilateral carotid artery disease ([place]) 12/20/2012  . PVC's (premature ventricular contractions) 12/20/2012  . Dyslipidemia 12/20/2012    Lyndee Hensen, PT, DPT 2:55 PM  04/29/17    [facility] [place], [place], [address] Phone: [phone]   Fax:  [phone]  Name: Shari Horn MRN: [id number] Date of Birth: Jan 12, 1941

## 2017-05-02 ENCOUNTER — Other Ambulatory Visit: Payer: Self-pay | Admitting: Family Medicine

## 2017-05-03 ENCOUNTER — Ambulatory Visit: Payer: Medicare Other | Admitting: Physical Therapy

## 2017-05-03 DIAGNOSIS — R296 Repeated falls: Secondary | ICD-10-CM

## 2017-05-03 DIAGNOSIS — R2681 Unsteadiness on feet: Secondary | ICD-10-CM | POA: Diagnosis not present

## 2017-05-03 NOTE — Telephone Encounter (Signed)
Called pharmacy and she picked up 4//14/9019

## 2017-05-03 NOTE — Telephone Encounter (Signed)
Called pharmacy and they said she picked up rx on 05/02/2017

## 2017-05-04 ENCOUNTER — Encounter: Payer: Self-pay | Admitting: Physical Therapy

## 2017-05-04 NOTE — Therapy (Signed)
[facility] [address] [place], [place], [address] Phone: [phone]   Fax:  [phone]  Physical Therapy Treatment  Patient Details  Name: Shari Horn MRN: [id number] Date of Birth: 03-29-1940 Referring Provider: Dr. Dimas Chyle   Encounter Date: 05/03/2017  PT End of Session - 05/04/17 1015    Visit Number  9    Number of Visits  16    Date for PT Re-Evaluation  05/21/17    Authorization Type  [employer] $40 copay    PT Start Time  1430    PT Stop Time  1513    PT Time Calculation (min)  43 min    Equipment Utilized During Treatment  Gait belt    Activity Tolerance  Patient tolerated treatment well;Treatment limited secondary to medical complications (Comment)    Behavior During Therapy  [facility] for tasks assessed/performed       Past Medical History:  Diagnosis Date  . Carotid artery disease ([place])   . Depression   . Dyslipidemia   . Hyperlipidemia   . Hypothyroidism   . 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 PVC's (premature ventricular contractions)     Past Surgical History:  Procedure Laterality Date  . BREAST BIOPSY    . BREAST EXCISIONAL BIOPSY Left 1990  . BREAST EXCISIONAL BIOPSY Left 1996  . Carotid Doppler  12/2011   0-49% RICA stenosis, 70-62% LICA stenosis  . TRANSTHORACIC ECHOCARDIOGRAM  12/2011   EF 37-62%, grade 1 diastolic dysfunction; trivial AV regurg; calcified MV annulus    There were no vitals filed for this visit.  Subjective Assessment - 05/04/17 1013    Subjective  Pt states that she feels "shakey" today, but feels ok and drove herself here. She also states that she has been worried about looking into moving into assisted living/senior housing, she thinks this is causing her some stress.     Currently in Pain?  No/denies                       [facility] Adult PT Treatment/Exercise - 05/03/17 1451      Knee/Hip Exercises: Aerobic   Stationary Bike  Ll1 x 8 min      Knee/Hip Exercises:  Standing   Heel Raises  20 reps    Hip Flexion  20 reps;Knee bent    Hip Flexion Limitations  no UE support    Hip Abduction  20 reps    Abduction Limitations  2lb    Gait Training  35 ftx8 quick;  bkwd walking 35 st x2;     Other Standing Knee Exercises    weight shifts L/R and A/P x30 each on AirEx;  Tandem stance 20 sec x3 bil'    Other Standing Knee Exercises   up/down 6 in step x8 each bil;              PT Education - 05/04/17 1014    Education provided  Yes    Education Details  Education oN BP, and need to continue to monitor, or see MD if she does not feel well later today, pt states understanding.     Person(s) Educated  Patient    Methods  Explanation    Comprehension  Verbalized understanding       PT Short Term Goals - 03/26/17 1010      PT SHORT TERM GOAL #1   Title  verbalize understanding of fall prevention strategies  Status  New    Target Date  04/23/17      PT SHORT TERM GOAL #2   Title  improve BERG balance score to >/=45/56 for decreased fall risk    Status  New    Target Date  04/23/17      PT SHORT TERM GOAL #3   Title  improve timed up and go to < 15 sec for improved balance    Status  New    Target Date  04/23/17      PT SHORT TERM GOAL #4   Title  amb > 350' with LRAD modified independent for improved function    Status  New    Target Date  04/23/17        PT Long Term Goals - 03/26/17 1012      PT LONG TERM GOAL #1   Title  independent with HEP    Status  New    Target Date  05/21/17      PT LONG TERM GOAL #2   Title  improve BERG balance score to >/= 48/56 for improved balance    Status  New    Target Date  05/21/17      PT LONG TERM GOAL #3   Title  improve DGI to >/= 20/24 for improved mobility and decreased fall risk    Status  New    Target Date  05/21/17      PT LONG TERM GOAL #4   Title  improve timed up and go to < 13 sec for decreased fall risk    Status  New    Target Date  05/21/17            Plan  - 05/04/17 1019    Clinical Impression Statement  BP checked, due to pt feeling "shakey". BP 150/82 at start of session, and 150/90 at end. Pt denies any symptoms of dizziness, vision, etc. Discussed checking BP later today and tomororw if she continues to feel shakey, also discussed hydration and nutrition. Pt states understanding. Mild decrease in activity done today, pt allowed to sit and rest between exercises. Pt with  no increased difficulty with activities today. Plan to progress balance exercises as tolerated.     PT Treatment/Interventions  ADLs/Self Care Home Management;Canalith Repostioning;Electrical Stimulation;Cryotherapy;Moist Heat;DME Instruction;Gait training;Stair training;Functional mobility training;Therapeutic activities;Therapeutic exercise;Patient/family education;Neuromuscular re-education;Balance training;Vestibular       Patient will benefit from skilled therapeutic intervention in order to improve the following deficits and impairments:  Abnormal gait, Decreased mobility, Decreased strength, Decreased balance, Difficulty walking, Postural dysfunction  Visit Diagnosis: Unsteadiness on feet  Frequent falls     Problem List Patient Active Problem List   Diagnosis Date Noted  . Trochanteric bursitis of right hip 03/19/2017  . Rib pain on right side 03/19/2017  . Forgetfulness 03/10/2017  . Frequent falls 01/25/2017  . Gastroesophageal reflux disease without esophagitis 10/12/2016  . Osteopenia 09/17/2016  . Dysphagia 09/14/2016  . Bipolar 2 disorder ([place]) 09/14/2016  . Hypothyroidism 09/14/2016  . New onset of headaches after age 71 05/27/2016  . Positional vertigo 09/08/2013  . Bilateral carotid artery disease ([place]) 12/20/2012  . PVC's (premature ventricular contractions) 12/20/2012  . Dyslipidemia 12/20/2012   Lyndee Hensen, PT, DPT 10:22 AM  05/04/17    [facility] [place], [place],  [address] Phone: [phone]   Fax:  [phone]  Name: Shari Horn MRN: [id number] Date of Birth: 01-02-1941

## 2017-05-06 ENCOUNTER — Encounter: Payer: Self-pay | Admitting: Physical Therapy

## 2017-05-06 ENCOUNTER — Ambulatory Visit: Payer: Medicare Other | Admitting: Physical Therapy

## 2017-05-06 DIAGNOSIS — R2681 Unsteadiness on feet: Secondary | ICD-10-CM | POA: Diagnosis not present

## 2017-05-06 DIAGNOSIS — R296 Repeated falls: Secondary | ICD-10-CM

## 2017-05-06 NOTE — Therapy (Signed)
[facility] [address] [place], [place], [address] Phone: [phone]   Fax:  [phone]  Physical Therapy Treatment  Patient Details  Name: Shari Horn MRN: [id number] Date of Birth: 11-24-1940 Referring Provider: Dr. Dimas Chyle   Encounter Date: 05/06/2017  PT End of Session - 05/06/17 1520    Visit Number  10    Number of Visits  16    Date for PT Re-Evaluation  05/21/17    Authorization Type  [employer] $40 copay    PT Start Time  1426    PT Stop Time  1513    PT Time Calculation (min)  47 min    Equipment Utilized During Treatment  Gait belt    Activity Tolerance  Patient tolerated treatment well;Treatment limited secondary to medical complications (Comment)    Behavior During Therapy  [facility] for tasks assessed/performed       Past Medical History:  Diagnosis Date  . Carotid artery disease ([place])   . Depression   . Dyslipidemia   . Hyperlipidemia   . Hypothyroidism   . 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 PVC's (premature ventricular contractions)     Past Surgical History:  Procedure Laterality Date  . BREAST BIOPSY    . BREAST EXCISIONAL BIOPSY Left 1990  . BREAST EXCISIONAL BIOPSY Left 1996  . Carotid Doppler  12/2011   0-49% RICA stenosis, 27-51% LICA stenosis  . TRANSTHORACIC ECHOCARDIOGRAM  12/2011   EF 70-01%, grade 1 diastolic dysfunction; trivial AV regurg; calcified MV annulus    There were no vitals filed for this visit.  Subjective Assessment - 05/06/17 1518    Subjective  Pt states that she is still feeling somewhat worried about general aging, and process of looking for senior housing. She did make an appt with our behavioral therapist next week. She states that she feels like some of these issues are "weighing her down" but denies any actual physical discomfort, symptoms. BP checked: 122/80 today.                        [facility] Adult PT Treatment/Exercise - 05/06/17 1440      Knee/Hip  Exercises: Standing   Heel Raises  20 reps    Hip Flexion  20 reps;Knee bent    Hip Flexion Limitations  no UE support    Hip Abduction  20 reps    Abduction Limitations  2lb    Hip Extension  2 sets;10 reps;Both    Gait Training  200 ft with direction changes; bkwd walking 35 st x2;  side stepping 25 ft x2;  Outdoor ambulation on uneven surface176ft with mild inclines; Up/down curb x3 no UE support;     Other Standing Knee Exercises        Other Standing Knee Exercises   Stairs up/down 5 steps x6 with 1 UE support;       Manual Therapy   Manual Therapy  Passive ROM    Passive ROM  Prone hip PROM, IR/ER, and extension             PT Education - 05/06/17 1520    Education provided  Yes    Education Details  Discussed progress in PT home safety     Person(s) Educated  Patient    Methods  Explanation    Comprehension  Verbalized understanding       PT Short Term Goals - 03/26/17 1010  PT SHORT TERM GOAL #1   Title  verbalize understanding of fall prevention strategies    Status  New    Target Date  04/23/17      PT SHORT TERM GOAL #2   Title  improve BERG balance score to >/=45/56 for decreased fall risk    Status  New    Target Date  04/23/17      PT SHORT TERM GOAL #3   Title  improve timed up and go to < 15 sec for improved balance    Status  New    Target Date  04/23/17      PT SHORT TERM GOAL #4   Title  amb > 350' with LRAD modified independent for improved function    Status  New    Target Date  04/23/17        PT Long Term Goals - 03/26/17 1012      PT LONG TERM GOAL #1   Title  independent with HEP    Status  New    Target Date  05/21/17      PT LONG TERM GOAL #2   Title  improve BERG balance score to >/= 48/56 for improved balance    Status  New    Target Date  05/21/17      PT LONG TERM GOAL #3   Title  improve DGI to >/= 20/24 for improved mobility and decreased fall risk    Status  New    Target Date  05/21/17      PT LONG TERM  GOAL #4   Title  improve timed up and go to < 13 sec for decreased fall risk    Status  New    Target Date  05/21/17            Plan - 05/06/17 1522    Clinical Impression Statement  Pt cued for using vision when performing balance activities or outdoor activities that she is unsure of. She has difficulty remembering safety cues such as this that have been discussed in previous sessions. Pt with no LOB outdoors, or with direction changes / longer distance ambulation that was done today. She does have decreaesd stability and decreased speed with direction changes. Pt improving with confidence and stability on stairs and with dynamic gait.     PT Treatment/Interventions  ADLs/Self Care Home Management;Canalith Repostioning;Electrical Stimulation;Cryotherapy;Moist Heat;DME Instruction;Gait training;Stair training;Functional mobility training;Therapeutic activities;Therapeutic exercise;Patient/family education;Neuromuscular re-education;Balance training;Vestibular       Patient will benefit from skilled therapeutic intervention in order to improve the following deficits and impairments:  Abnormal gait, Decreased mobility, Decreased strength, Decreased balance, Difficulty walking, Postural dysfunction  Visit Diagnosis: Unsteadiness on feet  Frequent falls     Problem List Patient Active Problem List   Diagnosis Date Noted  . Trochanteric bursitis of right hip 03/19/2017  . Rib pain on right side 03/19/2017  . Forgetfulness 03/10/2017  . Frequent falls 01/25/2017  . Gastroesophageal reflux disease without esophagitis 10/12/2016  . Osteopenia 09/17/2016  . Dysphagia 09/14/2016  . Bipolar 2 disorder ([place]) 09/14/2016  . Hypothyroidism 09/14/2016  . New onset of headaches after age 37 05/27/2016  . Positional vertigo 09/08/2013  . Bilateral carotid artery disease ([place]) 12/20/2012  . PVC's (premature ventricular contractions) 12/20/2012  . Dyslipidemia 12/20/2012   Lyndee Hensen,  PT, DPT 3:24 PM  05/06/17    [facility] [address] [place], [place], [address] Phone: [phone]   Fax:  [phone]  Name: Shari Horn MRN: [id number] Date of Birth: 06-May-1940

## 2017-05-13 ENCOUNTER — Encounter: Payer: Self-pay | Admitting: Physical Therapy

## 2017-05-13 ENCOUNTER — Ambulatory Visit: Payer: Medicare Other | Admitting: Physical Therapy

## 2017-05-13 DIAGNOSIS — R296 Repeated falls: Secondary | ICD-10-CM | POA: Diagnosis not present

## 2017-05-13 DIAGNOSIS — R2681 Unsteadiness on feet: Secondary | ICD-10-CM | POA: Diagnosis not present

## 2017-05-13 NOTE — Therapy (Addendum)
[facility] [address] [place], [place], [address] Phone: [phone]   Fax:  [phone]  Physical Therapy Treatment  Patient Details  Name: Shari Horn MRN: [id number] Date of Birth: [birth year] Referring Provider: Dr. Dimas Chyle   Encounter Date: 05/13/2017  PT End of Session - 05/13/17 1606    Visit Number  11    Number of Visits  16    Date for PT Re-Evaluation  05/21/17    Authorization Type  [employer] $40 copay    PT Start Time  1515    PT Stop Time  1558    PT Time Calculation (min)  43 min    Equipment Utilized During Treatment  Gait belt    Activity Tolerance  Patient tolerated treatment well;Treatment limited secondary to medical complications (Comment)    Behavior During Therapy  [employer] for tasks assessed/performed       Past Medical History:  Diagnosis Date  . Carotid artery disease ([place])   . Depression   . Dyslipidemia   . Hyperlipidemia   . Hypothyroidism   . 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 PVC's (premature ventricular contractions)     Past Surgical History:  Procedure Laterality Date  . BREAST BIOPSY    . BREAST EXCISIONAL BIOPSY Left 1990  . BREAST EXCISIONAL BIOPSY Left 1996  . Carotid Doppler  12/2011   0-49% RICA stenosis, 95-28% LICA stenosis  . TRANSTHORACIC ECHOCARDIOGRAM  12/2011   EF 41-32%, grade 1 diastolic dysfunction; trivial AV regurg; calcified MV annulus    There were no vitals filed for this visit.  Subjective Assessment - 05/13/17 1605    Subjective  Pt with no new complaints. She is going to start helping daugher a couple times a week, and may only be able to attend PT 1x/wk in coming months.                        [facility] Adult PT Treatment/Exercise - 05/13/17 1522      Knee/Hip Exercises: Standing   Hip Flexion  20 reps;Knee bent    Hip Flexion Limitations  AirEx 1 ue support    Hip Abduction  20 reps    Abduction Limitations  2lb    Hip Extension  --    Lateral  Step Up  10 reps;Both;Step Height: 4"    Gait Training   bkwd walking 35 ft x4;  forward 35 ft x8;     Other Standing Knee Exercises  Tandem stance 30 sec 3 bil with head turns; Weight shifts wtih staggered stance x20 bil;     Other Standing Knee Exercises   Stairs up/down 5 steps x6 with 1 UE support; Toe taps x20 bil;       Manual Therapy   Manual Therapy  --    Passive ROM  --             PT Education - 05/13/17 1606    Education provided  Yes    Education Details  Safety with outdoor ambulation, discussed footwear (pt wearing sandals today),     Person(s) Educated  Patient    Methods  Explanation    Comprehension  Verbalized understanding       PT Short Term Goals - 03/26/17 1010      PT SHORT TERM GOAL #1   Title  verbalize understanding of fall prevention strategies    Status  New    Target Date  04/23/17      PT SHORT TERM GOAL #2   Title  improve BERG balance score to >/=45/56 for decreased fall risk    Status  New    Target Date  04/23/17      PT SHORT TERM GOAL #3   Title  improve timed up and go to < 15 sec for improved balance    Status  New    Target Date  04/23/17      PT SHORT TERM GOAL #4   Title  amb > 350' with LRAD modified independent for improved function    Status  New    Target Date  04/23/17        PT Long Term Goals - 03/26/17 1012      PT LONG TERM GOAL #1   Title  independent with HEP    Status  New    Target Date  05/21/17      PT LONG TERM GOAL #2   Title  improve BERG balance score to >/= 48/56 for improved balance    Status  New    Target Date  05/21/17      PT LONG TERM GOAL #3   Title  improve DGI to >/= 20/24 for improved mobility and decreased fall risk    Status  New    Target Date  05/21/17      PT LONG TERM GOAL #4   Title  improve timed up and go to < 13 sec for decreased fall risk    Status  New    Target Date  05/21/17            Plan - 05/13/17 1657    Clinical Impression Statement  Pt wearing  sandals today, discussed recommendation to wear sneakers if she is going to be outdoors or walking any distance in community. Sandals safe for short duration activity. Pt with improving static and dynamic balance and ability for weight shifts. She requires verbal cuing for slowing speed of most ambulation activities and for direction changes. Recommended decreased speed for increased safety . Pt with difficultyremembering safety cues that have been discussed at previous sessions. Pt will likely only be able to attend PT 1x/wk due to schedule change and needing to help daugher with transportation.     PT Treatment/Interventions  ADLs/Self Care Home Management;Canalith Repostioning;Electrical Stimulation;Cryotherapy;Moist Heat;DME Instruction;Gait training;Stair training;Functional mobility training;Therapeutic activities;Therapeutic exercise;Patient/family education;Neuromuscular re-education;Balance training;Vestibular       Patient will benefit from skilled therapeutic intervention in order to improve the following deficits and impairments:  Abnormal gait, Decreased mobility, Decreased strength, Decreased balance, Difficulty walking, Postural dysfunction  Visit Diagnosis: Unsteadiness on feet  Frequent falls     Problem List Patient Active Problem List   Diagnosis Date Noted  . Trochanteric bursitis of right hip 03/19/2017  . Rib pain on right side 03/19/2017  . Forgetfulness 03/10/2017  . Frequent falls 01/25/2017  . Gastroesophageal reflux disease without esophagitis 10/12/2016  . Osteopenia 09/17/2016  . Dysphagia 09/14/2016  . Bipolar 2 disorder (HCC) 09/14/2016  . Hypothyroidism 09/14/2016  . New onset of headaches after age 49 05/27/2016  . Positional vertigo 09/08/2013  . Bilateral carotid artery disease (HCC) 12/20/2012  . PVC's (premature ventricular contractions) 12/20/2012  . Dyslipidemia 12/20/2012    , PT, DPT 5:00 PM  05/13/17    [facility] [address] [place], [place], [address] Phone: [phone]   Fax:  [phone]  Name: Shari Horn   MRN: [id number] Date of Birth: 20-Oct-1940      PHYSICAL THERAPY DISCHARGE SUMMARY  Visits from Start of Care: 11 Plan: Patient agrees to discharge.  Patient goals were partially met. Patient is being discharged due to not returning since the last visit.  ?????      Pt requested d/c due to family emergency, and having a family member pass away, pt had to spend a few weeks out of town.    Lyndee Hensen, PT, DPT 10:35 AM  07/14/17

## 2017-05-14 ENCOUNTER — Ambulatory Visit: Payer: Medicare Other | Admitting: Psychology

## 2017-05-14 ENCOUNTER — Ambulatory Visit (INDEPENDENT_AMBULATORY_CARE_PROVIDER_SITE_OTHER): Payer: Medicare Other | Admitting: Psychology

## 2017-05-14 DIAGNOSIS — F330 Major depressive disorder, recurrent, mild: Secondary | ICD-10-CM | POA: Diagnosis not present

## 2017-05-21 ENCOUNTER — Ambulatory Visit (INDEPENDENT_AMBULATORY_CARE_PROVIDER_SITE_OTHER): Payer: Medicare Other | Admitting: Orthopaedic Surgery

## 2017-05-28 ENCOUNTER — Other Ambulatory Visit: Payer: Self-pay | Admitting: Family Medicine

## 2017-07-01 ENCOUNTER — Ambulatory Visit: Payer: Medicare Other | Admitting: Psychology

## 2017-07-01 DIAGNOSIS — F330 Major depressive disorder, recurrent, mild: Secondary | ICD-10-CM

## 2017-07-12 ENCOUNTER — Ambulatory Visit: Payer: Medicare Other | Admitting: Psychology

## 2017-07-13 ENCOUNTER — Ambulatory Visit: Payer: Self-pay | Admitting: Physician Assistant

## 2017-08-10 ENCOUNTER — Other Ambulatory Visit: Payer: Self-pay | Admitting: Internal Medicine

## 2017-08-10 NOTE — Telephone Encounter (Signed)
Rx sent to pharmacy   

## 2017-08-19 ENCOUNTER — Other Ambulatory Visit: Payer: Self-pay | Admitting: Internal Medicine

## 2017-08-19 DIAGNOSIS — I6523 Occlusion and stenosis of bilateral carotid arteries: Secondary | ICD-10-CM

## 2017-08-20 ENCOUNTER — Ambulatory Visit: Payer: Medicare Other | Admitting: Psychology

## 2017-09-09 ENCOUNTER — Inpatient Hospital Stay (HOSPITAL_COMMUNITY): Admission: RE | Admit: 2017-09-09 | Payer: Self-pay | Source: Ambulatory Visit

## 2017-09-16 ENCOUNTER — Ambulatory Visit: Payer: Medicare Other | Admitting: Psychology

## 2017-09-16 DIAGNOSIS — F330 Major depressive disorder, recurrent, mild: Secondary | ICD-10-CM | POA: Diagnosis not present

## 2017-09-29 ENCOUNTER — Ambulatory Visit: Payer: Self-pay

## 2017-09-29 ENCOUNTER — Ambulatory Visit (HOSPITAL_COMMUNITY)
Admission: RE | Admit: 2017-09-29 | Discharge: 2017-09-29 | Disposition: A | Discharge status: Home / Self Care | Payer: Medicare Other | Source: Ambulatory Visit | Attending: Cardiology | Admitting: Cardiology

## 2017-09-29 DIAGNOSIS — I6523 Occlusion and stenosis of bilateral carotid arteries: Secondary | ICD-10-CM | POA: Insufficient documentation

## 2017-09-29 NOTE — Telephone Encounter (Signed)
Patient called in with c/o "vertigo, headache." She says "it started about 1.5 weeks ago, being off balance at times, feeling unsteady but able to walk normally. The vertigo today is not as bad, but it has now developed into a headache." I asked what makes it worse, she says "getting up walking kicks it in, at times." I asked about other symptoms besides headache, she denies. According to protocol, see PCP within 24 hours, no availability with PCP, appointment scheduled for tomorrow at 0915 with Dr. Rogers Blocker, care advice given, patient verbalized understanding.   Reason for Disposition . [1] MODERATE dizziness (e.g., vertigo; feels very unsteady, interferes with normal activities) AND [2] has NOT been evaluated by physician for this  Answer Assessment - Initial Assessment Questions 1. DESCRIPTION: "Describe your dizziness."     Off balance at times 2. VERTIGO: "Do you feel like either you or the room is spinning or tilting?"     Not now 3. LIGHTHEADED: "Do you feel lightheaded?" (e.g., somewhat faint, woozy, weak upon standing)     Not really 4. SEVERITY: "How bad is it?"  "Can you walk?"   - MILD - Feels unsteady but walking normally.   - MODERATE - Feels very unsteady when walking, but not falling; interferes with normal activities (e.g., school, work) .   - SEVERE - Unable to walk without falling (requires assistance).     Mild-Moderate 5. ONSET:  "When did the dizziness begin?"     1.5 weeks ago 6. AGGRAVATING FACTORS: "Does anything make it worse?" (e.g., standing, change in head position)     I don't really know, it kicks in when walking at times 7. CAUSE: "What do you think is causing the dizziness?"     I don't know 8. RECURRENT SYMPTOM: "Have you had dizziness before?" If so, ask: "When was the last time?" "What happened that time?"     Yes in 2017; went to Neurologist and had a procedure for vertigo 9. OTHER SYMPTOMS: "Do you have any other symptoms?" (e.g., headache, weakness,  numbness, vomiting, earache)     Headache down to right shoulder at times 10. PREGNANCY: "Is there any chance you are pregnant?" "When was your last menstrual period?"       No  Protocols used: DIZZINESS - VERTIGO-A-AH

## 2017-09-30 ENCOUNTER — Ambulatory Visit: Payer: Medicare Other | Admitting: Family Medicine

## 2017-09-30 ENCOUNTER — Encounter: Payer: Self-pay | Admitting: Family Medicine

## 2017-09-30 VITALS — BP 142/80 | HR 70 | Temp 97.8°F | Ht 67.0 in | Wt 181.6 lb

## 2017-09-30 DIAGNOSIS — G44209 Tension-type headache, unspecified, not intractable: Secondary | ICD-10-CM

## 2017-09-30 DIAGNOSIS — R42 Dizziness and giddiness: Secondary | ICD-10-CM | POA: Diagnosis not present

## 2017-09-30 DIAGNOSIS — R5383 Other fatigue: Secondary | ICD-10-CM | POA: Diagnosis not present

## 2017-09-30 LAB — COMPREHENSIVE METABOLIC PANEL
ALT: 25 U/L (ref 0–35)
AST: 22 U/L (ref 0–37)
Albumin: 4.4 g/dL (ref 3.5–5.2)
Alkaline Phosphatase: 52 U/L (ref 39–117)
BILIRUBIN TOTAL: 0.4 mg/dL (ref 0.2–1.2)
BUN: 12 mg/dL (ref 6–23)
CALCIUM: 9.4 mg/dL (ref 8.4–10.5)
CHLORIDE: 104 meq/L (ref 96–112)
CO2: 26 meq/L (ref 19–32)
CREATININE: 0.82 mg/dL (ref 0.40–1.20)
GFR: 71.69 mL/min (ref >60.00)
Glucose, Bld: 95 mg/dL (ref 70–99)
Potassium: 4.3 mEq/L (ref 3.5–5.1)
SODIUM: 137 meq/L (ref 135–145)
Total Protein: 6.6 g/dL (ref 6.0–8.3)

## 2017-09-30 LAB — CBC WITH DIFFERENTIAL/PLATELET
BASOS ABS: 0 10*3/uL (ref 0.0–0.1)
BASOS PCT: 0.4 % (ref 0.0–3.0)
EOS ABS: 0.2 10*3/uL (ref 0.0–0.7)
Eosinophils Relative: 2.5 % (ref 0.0–5.0)
HEMATOCRIT: 41.2 % (ref 36.0–46.0)
Hemoglobin: 13.8 g/dL (ref 12.0–15.0)
LYMPHS PCT: 38.6 % (ref 12.0–46.0)
Lymphs Abs: 2.6 10*3/uL (ref 0.7–4.0)
MCHC: 33.4 g/dL (ref 30.0–36.0)
MCV: 92.6 fl (ref 78.0–100.0)
Monocytes Absolute: 0.6 10*3/uL (ref 0.1–1.0)
Monocytes Relative: 9.4 % (ref 3.0–12.0)
NEUTROS ABS: 3.2 10*3/uL (ref 1.4–7.7)
Neutrophils Relative %: 49.1 % (ref 43.0–77.0)
PLATELETS: 240 10*3/uL (ref 150.0–400.0)
RBC: 4.45 Mil/uL (ref 3.87–5.11)
RDW: 13.5 % (ref 11.5–15.5)
WBC: 6.6 10*3/uL (ref 4.0–10.5)

## 2017-09-30 LAB — VITAMIN D 25 HYDROXY (VIT D DEFICIENCY, FRACTURES): VITD: 52.91 ng/mL (ref 30.00–100.00)

## 2017-09-30 LAB — TSH: TSH: 0.02 u[IU]/mL — ABNORMAL LOW (ref 0.35–4.50)

## 2017-09-30 MED ORDER — MECLIZINE HCL 25 MG PO TABS: 25.0000 mg | ORAL_TABLET | Freq: Three times a day (TID) | ORAL | 0 refills | Status: AC | PRN

## 2017-09-30 NOTE — Progress Notes (Signed)
Patient: Shari Horn MRN: [id number] DOB: May 10, 1940 PCP: Vivi Barrack, MD     Subjective:  Chief Complaint  Patient presents with  . Headache  . Dizziness    HPI: The patient is a 77 y.o. female who presents today for dizziness/vertigo and headache x10 days. She has a history of positional vertigo. This episode began as a similar vertigo episode, but 4 days ago turned into nausea, headache and balance off. Does not remember nausea with her last episode of vertigo, but was off balance. Has completed neuro PT for past episodes. She also has headaches in her problem list and asked if this is a normal headache for her. She has had headaches in the past when she was off balance in the past.  As far as the dizziness goes she has had this for about 7 days and then the dizziness has turned into a headache with Horn in her right eye. She is really not dizzy anymore and states more just unsteady on her feet. No new medication started except over the counter medication (dramamine and ibuprofen).   Headache: located on the right side of her head and right eye hurts. Headache is constant and is going on 4-5 days. Headache rated as a 6/10 and described as "like a ball in her head that is too big for the space." Horn seems to radiate to her right eye. She does not have any eye discharge/watering. She denies any nasal discharge as well. She states the ibuprofen is helping her, but after it wears off the headache comes back. Sleep also seems to help her. Nothing seems to exacerbate the headache. They do not wake her up in the middle of the night, no photophobia or phonophobia. Denies any thunderclap headache and no focal deficits. No vision changes.   Fatigue: She has been exhausted for about 4-5 days. She did start dramamine which could be contributing. She usually just takes this in the AM. She has had chills one time, but no fever. She is sleeping good. She gets about 8 hours/sleep a night. She was supposed  to be exercising, but hasn't due to above symptoms. She denies any blood in her stool, easy bruising. Does have thyroid issues and due for recheck. No history of anemia.   Review of Systems  Constitutional: Positive for fatigue. Negative for chills, fever and unexpected weight change.  HENT: Negative for congestion, ear Horn, sinus pressure, sinus Horn and tinnitus.   Eyes: Negative for photophobia, Horn and visual disturbance.  Respiratory: Negative for cough, shortness of breath and wheezing.   Cardiovascular: Negative for chest Horn and palpitations.  Gastrointestinal: Positive for nausea. Negative for abdominal Horn, blood in stool and vomiting.  Genitourinary: Negative for dysuria.  Neurological: Positive for headaches. Negative for dizziness, syncope, facial asymmetry, weakness, light-headedness and numbness.  Psychiatric/Behavioral: Negative for sleep disturbance. The patient is not nervous/anxious.     Allergies Patient is allergic to codeine.  Past Medical History Patient  has a past medical history of Carotid artery disease ([place]), Depression, Dyslipidemia, Hyperlipidemia, Hypothyroidism, Mood disorder ([place]), and PVC's (premature ventricular contractions).  Surgical History Patient  has a past surgical history that includes transthoracic echocardiogram (12/2011); Carotid Doppler (12/2011); Breast biopsy; Breast excisional biopsy (Left, 1990); and Breast excisional biopsy (Left, 1996).  Family History Pateint's family history includes Arthritis/Rheumatoid in her father; Diabetes in her mother; Heart Problems in her maternal grandmother; Heart disease in her brother and child; Heart failure in her mother; Hypertension in her  mother; Thyroid disease in her mother.  Social History Patient  reports that she quit smoking about 44 years ago. Her smoking use included cigarettes. She quit after 10.00 years of use. She has never used smokeless tobacco. She reports that she does not drink  alcohol or use drugs.    Objective: Vitals:   09/30/17 0907  BP: (!) 142/80  Pulse: 70  Temp: 97.8 F (36.6 C)  TempSrc: Oral  SpO2: 98%  Weight: 181 lb 9.6 oz (82.4 kg)  Height: 5\' 7"  (1.702 m)    Body mass index is 28.44 kg/m.  Physical Exam  Constitutional: She appears well-developed and well-nourished.  HENT:  Head: Normocephalic and atraumatic.  Mouth/Throat: Oropharynx is clear and moist.  Eyes: Pupils are equal, round, and reactive to light.  Neck: Normal range of motion. Neck supple.  Cardiovascular: Normal rate, regular rhythm and normal heart sounds.  Pulmonary/Chest: Effort normal and breath sounds normal.  Abdominal: Soft. Bowel sounds are normal.  Neurological: She is alert. She has normal strength. She is not disoriented. She displays normal reflexes. No cranial nerve deficit or sensory deficit. Coordination and gait normal.  dix halpike negative bilaterally   Skin: Skin is warm and dry.  Psychiatric: She has a normal mood and affect. Her behavior is normal.  Vitals reviewed.      Assessment/plan: 1. Dizziness Has resolved. Orthostatics normal. Likely vertigo. Gave her home treatments to do for vertigo if happens again as well as meclizine prn. Dramamine may be a big cause of the fatigue and instability and suggested she stop this. Have f/u with pcp in one month.    2. Tension headache No red flags and responding to ibuprofen. Has hx of headaches in her problem list.  Could be from dizziness. Continue ibuprofen prn. Checking renal function today as well. Also want her to see her eye doc this week to have her pressures checked an eye exam. Precautions given for worsening symptoms and to go to ER if sudden vision loss, extreme Horn in eye, thunderclap h/a, focal deficits or wakes her up in middle of the night.    3. Other fatigue Likely secondary to dramamine. Suggested she stop this. Due for labs and checking other causes as well. Will follow up with labs and  have her f/u with pcp within the month.  - CBC with Differential/Platelet - Comprehensive metabolic panel - TSH - VITAMIN D 25 Hydroxy (Vit-D Deficiency, Fractures)    Return in about 1 month (around 10/30/2017) for fatigue .   Orma Flaming, MD [place]   09/30/2017

## 2017-10-01 ENCOUNTER — Other Ambulatory Visit: Payer: Self-pay

## 2017-10-01 ENCOUNTER — Other Ambulatory Visit: Payer: Self-pay | Admitting: *Deleted

## 2017-10-01 ENCOUNTER — Other Ambulatory Visit: Payer: Self-pay | Admitting: Family Medicine

## 2017-10-01 DIAGNOSIS — E038 Other specified hypothyroidism: Secondary | ICD-10-CM

## 2017-10-01 DIAGNOSIS — I6523 Occlusion and stenosis of bilateral carotid arteries: Secondary | ICD-10-CM

## 2017-10-01 MED ORDER — LEVOTHYROXINE SODIUM 88 MCG PO TABS: 88.0000 ug | ORAL_TABLET | Freq: Every day | ORAL | 0 refills | Status: DC

## 2017-10-06 ENCOUNTER — Ambulatory Visit: Payer: Self-pay | Admitting: Internal Medicine

## 2017-10-14 ENCOUNTER — Other Ambulatory Visit: Payer: Self-pay | Admitting: *Deleted

## 2017-10-14 DIAGNOSIS — I779 Disorder of arteries and arterioles, unspecified: Secondary | ICD-10-CM

## 2017-10-14 DIAGNOSIS — I739 Peripheral vascular disease, unspecified: Principal | ICD-10-CM

## 2017-10-21 ENCOUNTER — Ambulatory Visit: Payer: Medicare Other | Admitting: Internal Medicine

## 2017-11-04 ENCOUNTER — Ambulatory Visit: Payer: Medicare Other | Admitting: Family Medicine

## 2017-11-08 ENCOUNTER — Ambulatory Visit: Payer: Medicare Other | Admitting: Psychology

## 2017-11-12 ENCOUNTER — Other Ambulatory Visit: Payer: Self-pay | Admitting: Internal Medicine

## 2017-11-15 ENCOUNTER — Encounter: Payer: Self-pay | Admitting: Family Medicine

## 2017-11-15 ENCOUNTER — Ambulatory Visit: Payer: Medicare Other | Admitting: Family Medicine

## 2017-11-15 ENCOUNTER — Ambulatory Visit (INDEPENDENT_AMBULATORY_CARE_PROVIDER_SITE_OTHER): Payer: Medicare Other

## 2017-11-15 VITALS — BP 136/78 | HR 78 | Temp 97.8°F | Ht 67.0 in | Wt 178.2 lb

## 2017-11-15 DIAGNOSIS — R109 Unspecified abdominal pain: Secondary | ICD-10-CM

## 2017-11-15 DIAGNOSIS — Z23 Encounter for immunization: Secondary | ICD-10-CM

## 2017-11-15 DIAGNOSIS — E038 Other specified hypothyroidism: Secondary | ICD-10-CM

## 2017-11-15 MED ORDER — PANTOPRAZOLE SODIUM 40 MG PO TBEC: 40.0000 mg | DELAYED_RELEASE_TABLET | Freq: Every day | ORAL | 3 refills | Status: DC

## 2017-11-15 NOTE — Patient Instructions (Signed)
It was very nice to see you today!  We will check blood work today and an x-ray.  It is possible you could have a pulled muscle that is causing your symptoms.  Please take Tylenol as needed. You can take 1000mg  three times daily.   Please let me know if your symptoms worsen or do not improve the next few weeks, and we can get a CT scan.  Take care, Dr Jerline Pain

## 2017-11-15 NOTE — Progress Notes (Signed)
   Subjective:  Shari Horn is a 77 y.o. female who presents today for same-day appointment with a chief complaint of abdominal Horn.   HPI:  Abdominal Horn, Acute problem Started 2-3 months ago.  Located on right side of abdomen.  Is there constantly.  Worse with certain movements.  She has not noticed any other obvious aggravating factors.  No difference with food or with eating.  No specific treatments tried.  No nausea or vomiting.  No constipation or diarrhea.  She has had some sensation of food getting stuck after she eats for the past year.  This is at its baseline.  No fevers or chills.  Hypothyroidism Patient found to have low TSH on blood draw 6 weeks ago.  ROS: Per HPI  PMH: She reports that she quit smoking about 44 years ago. Her smoking use included cigarettes. She quit after 10.00 years of use. She has never used smokeless tobacco. She reports that she does not drink alcohol or use drugs.  Objective:  Physical Exam: BP 136/78 (BP Location: Left Arm, Patient Position: Sitting, Cuff Size: Normal)   Pulse 78   Temp 97.8 F (36.6 C) (Oral)   Ht 5\' 7"  (1.702 m)   Wt 178 lb 3.2 oz (80.8 kg)   LMP  (LMP Unknown)   SpO2 96%   BMI 27.91 kg/m   Wt Readings from Last 3 Encounters:  11/15/17 178 lb 3.2 oz (80.8 kg)  09/30/17 181 lb 9.6 oz (82.4 kg)  01/25/17 195 lb (88.5 kg)  Gen: NAD, resting comfortably CV: RRR with no murmurs appreciated Pulm: NWOB, CTAB with no crackles, wheezes, or rhonchi GI: Normal bowel sounds present. Soft, Nontender, Nondistended.  Assessment/Plan:  Abdominal Horn Unclear etiology.  No red flag signs or symptoms.  Her abdominal exam is benign.  Possibly MSK related.  Had right upper quadrant ultrasound done 2 years ago which was negative for gallstones, and her history is not consistent with gallbladder pathology.  We will check CBC, CMET, and lipase.  She has a history of constipation which could be contributing.  Check KUB today as well.   Recommended that she take Tylenol as needed for any possible underlying MSK etiology.  If symptoms persist and above work-up is negative, will need abdominal CT.  Hypothyroidism Continue Synthroid 88 mcg daily.  Has labs for thyroid studies pending today.  Preventative Healthcare Flu shot given today  Shari Horn. Shari Pain, MD 11/15/2017 4:55 PM

## 2017-11-15 NOTE — Assessment & Plan Note (Signed)
Continue Synthroid 88 mcg daily.  Has labs for thyroid studies pending today.

## 2017-11-16 LAB — COMPREHENSIVE METABOLIC PANEL
ALT: 18 U/L (ref 0–35)
AST: 21 U/L (ref 0–37)
Albumin: 4.4 g/dL (ref 3.5–5.2)
Alkaline Phosphatase: 48 U/L (ref 39–117)
BUN: 15 mg/dL (ref 6–23)
CHLORIDE: 104 meq/L (ref 96–112)
CO2: 26 meq/L (ref 19–32)
CREATININE: 0.95 mg/dL (ref 0.40–1.20)
Calcium: 9.5 mg/dL (ref 8.4–10.5)
GFR: 60.47 mL/min (ref >60.00)
Glucose, Bld: 116 mg/dL — ABNORMAL HIGH (ref 70–99)
POTASSIUM: 4 meq/L (ref 3.5–5.1)
SODIUM: 138 meq/L (ref 135–145)
Total Bilirubin: 0.3 mg/dL (ref 0.2–1.2)
Total Protein: 6.7 g/dL (ref 6.0–8.3)

## 2017-11-16 LAB — T4, FREE: FREE T4: 0.93 ng/dL (ref 0.60–1.60)

## 2017-11-16 LAB — CBC
HCT: 38.5 % (ref 36.0–46.0)
Hemoglobin: 13 g/dL (ref 12.0–15.0)
MCHC: 33.8 g/dL (ref 30.0–36.0)
MCV: 94.3 fl (ref 78.0–100.0)
PLATELETS: 248 10*3/uL (ref 150.0–400.0)
RBC: 4.09 Mil/uL (ref 3.87–5.11)
RDW: 13.4 % (ref 11.5–15.5)
WBC: 6.8 10*3/uL (ref 4.0–10.5)

## 2017-11-16 LAB — LIPASE: Lipase: 46 U/L (ref 11.0–59.0)

## 2017-11-16 LAB — TSH: TSH: 0.02 u[IU]/mL — AB (ref 0.35–4.50)

## 2017-11-17 ENCOUNTER — Other Ambulatory Visit: Payer: Self-pay

## 2017-11-17 DIAGNOSIS — E038 Other specified hypothyroidism: Secondary | ICD-10-CM

## 2017-11-17 MED ORDER — LEVOTHYROXINE SODIUM 75 MCG PO TABS: 75.0000 ug | ORAL_TABLET | Freq: Every day | ORAL | 0 refills | Status: DC

## 2017-11-17 NOTE — Progress Notes (Signed)
Please inform patient of the following:  Her thyroid level is still off. Would like for her to decrease to 81mcg daily. We should recheck in 4-6 weeks.  All of her other blood work was normal. No signs of gallbladder, pancreas, or liver issues causing her abdominal pain. She had a fair amount of constipation on her xray. This could be contributing to her symptoms. Would like for her to start taking miralax 1-2 packets or capfuls daily for the next 1-2 weeks. Goal would be for her to have at least 1-2 soft bowel movements daily. She should also make sure she is getting plenty of fluids.  Would like for her to let me know if her pain does not improve in the next couple of weeks so that we can discuss next steps.

## 2017-11-22 ENCOUNTER — Ambulatory Visit: Payer: Self-pay | Admitting: Neurology

## 2017-11-22 ENCOUNTER — Encounter

## 2017-11-23 ENCOUNTER — Other Ambulatory Visit: Payer: Self-pay | Admitting: Internal Medicine

## 2017-12-11 ENCOUNTER — Other Ambulatory Visit: Payer: Self-pay

## 2017-12-11 ENCOUNTER — Emergency Department (HOSPITAL_COMMUNITY): Payer: Medicare Other

## 2017-12-11 ENCOUNTER — Inpatient Hospital Stay (HOSPITAL_COMMUNITY)
Admission: EM | Admit: 2017-12-11 | Discharge: 2017-12-15 | DRG: 470 | Disposition: A | Discharge status: Skilled Nursing Facility | Payer: Medicare Other | Attending: Internal Medicine | Admitting: Internal Medicine

## 2017-12-11 ENCOUNTER — Inpatient Hospital Stay (HOSPITAL_COMMUNITY): Payer: Medicare Other

## 2017-12-11 ENCOUNTER — Encounter (HOSPITAL_COMMUNITY): Payer: Self-pay

## 2017-12-11 DIAGNOSIS — Z87891 Personal history of nicotine dependence: Secondary | ICD-10-CM | POA: Diagnosis not present

## 2017-12-11 DIAGNOSIS — Z419 Encounter for procedure for purposes other than remedying health state, unspecified: Secondary | ICD-10-CM

## 2017-12-11 DIAGNOSIS — F3181 Bipolar II disorder: Secondary | ICD-10-CM | POA: Diagnosis present

## 2017-12-11 DIAGNOSIS — Z7982 Long term (current) use of aspirin: Secondary | ICD-10-CM | POA: Diagnosis not present

## 2017-12-11 DIAGNOSIS — S72002A Fracture of unspecified part of neck of left femur, initial encounter for closed fracture: Secondary | ICD-10-CM | POA: Diagnosis not present

## 2017-12-11 DIAGNOSIS — N179 Acute kidney failure, unspecified: Secondary | ICD-10-CM | POA: Diagnosis present

## 2017-12-11 DIAGNOSIS — Z791 Long term (current) use of non-steroidal anti-inflammatories (NSAID): Secondary | ICD-10-CM

## 2017-12-11 DIAGNOSIS — E785 Hyperlipidemia, unspecified: Secondary | ICD-10-CM | POA: Diagnosis present

## 2017-12-11 DIAGNOSIS — M8580 Other specified disorders of bone density and structure, unspecified site: Secondary | ICD-10-CM | POA: Diagnosis present

## 2017-12-11 DIAGNOSIS — D62 Acute posthemorrhagic anemia: Secondary | ICD-10-CM | POA: Diagnosis present

## 2017-12-11 DIAGNOSIS — Z79890 Hormone replacement therapy (postmenopausal): Secondary | ICD-10-CM

## 2017-12-11 DIAGNOSIS — Z885 Allergy status to narcotic agent status: Secondary | ICD-10-CM

## 2017-12-11 DIAGNOSIS — E038 Other specified hypothyroidism: Secondary | ICD-10-CM

## 2017-12-11 DIAGNOSIS — E039 Hypothyroidism, unspecified: Secondary | ICD-10-CM | POA: Diagnosis present

## 2017-12-11 DIAGNOSIS — Z96642 Presence of left artificial hip joint: Secondary | ICD-10-CM

## 2017-12-11 DIAGNOSIS — S72009A Fracture of unspecified part of neck of unspecified femur, initial encounter for closed fracture: Secondary | ICD-10-CM | POA: Diagnosis present

## 2017-12-11 DIAGNOSIS — W010XXA Fall on same level from slipping, tripping and stumbling without subsequent striking against object, initial encounter: Secondary | ICD-10-CM | POA: Diagnosis present

## 2017-12-11 DIAGNOSIS — S72002D Fracture of unspecified part of neck of left femur, subsequent encounter for closed fracture with routine healing: Secondary | ICD-10-CM | POA: Diagnosis not present

## 2017-12-11 LAB — BASIC METABOLIC PANEL
ANION GAP: 7 (ref 5–15)
BUN: 28 mg/dL — ABNORMAL HIGH (ref 8–23)
CALCIUM: 9.5 mg/dL (ref 8.9–10.3)
CO2: 25 mmol/L (ref 22–32)
Chloride: 104 mmol/L (ref 98–111)
Creatinine, Ser: 1.02 mg/dL — ABNORMAL HIGH (ref 0.44–1.00)
GFR, EST AFRICAN AMERICAN: 59 mL/min — AB (ref >60)
GFR, EST NON AFRICAN AMERICAN: 51 mL/min — AB (ref >60)
GLUCOSE: 105 mg/dL — AB (ref 70–99)
POTASSIUM: 4.2 mmol/L (ref 3.5–5.1)
Sodium: 136 mmol/L (ref 135–145)

## 2017-12-11 LAB — CBC WITH DIFFERENTIAL/PLATELET
Abs Immature Granulocytes: 0.04 10*3/uL (ref 0.00–0.07)
BASOS PCT: 0 %
Basophils Absolute: 0 10*3/uL (ref 0.0–0.1)
Eosinophils Absolute: 0.1 10*3/uL (ref 0.0–0.5)
Eosinophils Relative: 1 %
HEMATOCRIT: 40.7 % (ref 36.0–46.0)
HEMOGLOBIN: 13.4 g/dL (ref 12.0–15.0)
IMMATURE GRANULOCYTES: 0 %
LYMPHS ABS: 2 10*3/uL (ref 0.7–4.0)
LYMPHS PCT: 20 %
MCH: 31.1 pg (ref 26.0–34.0)
MCHC: 32.9 g/dL (ref 30.0–36.0)
MCV: 94.4 fL (ref 80.0–100.0)
MONO ABS: 1 10*3/uL (ref 0.1–1.0)
Monocytes Relative: 10 %
NEUTROS ABS: 6.8 10*3/uL (ref 1.7–7.7)
NEUTROS PCT: 69 %
NRBC: 0 % (ref 0.0–0.2)
PLATELETS: 218 10*3/uL (ref 150–400)
RBC: 4.31 MIL/uL (ref 3.87–5.11)
RDW: 12.3 % (ref 11.5–15.5)
WBC: 10 10*3/uL (ref 4.0–10.5)

## 2017-12-11 LAB — PROTIME-INR
INR: 0.99
Prothrombin Time: 13 seconds (ref 11.4–15.2)

## 2017-12-11 LAB — TYPE AND SCREEN
ABO/RH(D): A POS
ANTIBODY SCREEN: NEGATIVE

## 2017-12-11 LAB — ABO/RH: ABO/RH(D): A POS

## 2017-12-11 MED ORDER — DOCUSATE SODIUM 100 MG PO CAPS
100.0000 mg | ORAL_CAPSULE | Freq: Two times a day (BID) | ORAL | Status: DC
  Administered 2017-12-11: 100 mg via ORAL
  Filled 2017-12-11: qty 1

## 2017-12-11 MED ORDER — MORPHINE SULFATE (PF) 4 MG/ML IV SOLN
4.0000 mg | INTRAVENOUS | Status: AC | PRN
  Administered 2017-12-11 (×2): 4 mg via INTRAVENOUS
  Filled 2017-12-11 (×2): qty 1

## 2017-12-11 MED ORDER — MORPHINE SULFATE (PF) 2 MG/ML IV SOLN
0.5000 mg | INTRAVENOUS | Status: DC | PRN
  Administered 2017-12-11: 0.5 mg via INTRAVENOUS
  Filled 2017-12-11: qty 1

## 2017-12-11 MED ORDER — OXYCODONE HCL 5 MG PO TABS
5.0000 mg | ORAL_TABLET | ORAL | Status: DC | PRN
  Administered 2017-12-11 – 2017-12-12 (×2): 10 mg via ORAL
  Filled 2017-12-11 (×2): qty 2

## 2017-12-11 MED ORDER — METHOCARBAMOL 500 MG PO TABS
500.0000 mg | ORAL_TABLET | Freq: Four times a day (QID) | ORAL | Status: DC | PRN
  Administered 2017-12-12: 500 mg via ORAL
  Filled 2017-12-11: qty 1

## 2017-12-11 MED ORDER — METHOCARBAMOL 1000 MG/10ML IJ SOLN
500.0000 mg | Freq: Four times a day (QID) | INTRAVENOUS | Status: DC | PRN
  Filled 2017-12-11: qty 5

## 2017-12-11 MED ORDER — ONDANSETRON HCL 4 MG/2ML IJ SOLN
4.0000 mg | Freq: Once | INTRAMUSCULAR | Status: AC
  Administered 2017-12-11: 4 mg via INTRAVENOUS
  Filled 2017-12-11: qty 2

## 2017-12-11 NOTE — Consult Note (Signed)
Reason for Consult:left hip fracture Referring Physician: Dr.David and [facility] Shari Horn is an 77 y.o. female.  HPI: Patient presented to [facility] this afternoon following a fall. Denies Syncope or CP. She was at the movie theater and when walking she missed a step causing her to far on her left side. She had immediate left hip pain. She was unable to bear weight. No other reports of pain in other areas.  She was brought [facility] via EMS. Following examiation and imaging confirmed a left hip femoral neck fracture. She was admitted to medicine. History of bipolar and as documented below.  No current CP, SOB, dizziness or HA. She lives independently without assistive devices. She request Dr.Aluisio's group on a friends recommendation. She has seen Dr.Xu in the past for multiple issues.   Past Medical History:  Diagnosis Date  . Carotid artery disease ([place])   . Depression   . Dyslipidemia   . Hyperlipidemia   . Hypothyroidism   . 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 PVC's (premature ventricular contractions)     Past Surgical History:  Procedure Laterality Date  . BREAST BIOPSY    . BREAST EXCISIONAL BIOPSY Left 1990  . BREAST EXCISIONAL BIOPSY Left 1996  . Carotid Doppler  12/2011   0-49% RICA stenosis, 54-65% LICA stenosis  . TRANSTHORACIC ECHOCARDIOGRAM  12/2011   EF 03-54%, grade 1 diastolic dysfunction; trivial AV regurg; calcified MV annulus    Family History  Problem Relation Age of Onset  . Heart failure Mother   . Thyroid disease Mother   . Hypertension Mother   . Diabetes Mother   . Arthritis/Rheumatoid Father   . Heart Problems Maternal Grandmother   . Heart disease Brother        valve replacement  . Heart disease Child   . Colon cancer Neg Hx   . Esophageal cancer Neg Hx   . Stomach cancer Neg Hx     Social History:  reports that she quit smoking about 45 years ago. Her smoking use included cigarettes. She quit after 10.00 years of use. She has never used smokeless tobacco.  She reports that she does not drink alcohol or use drugs.  Allergies:  Allergies  Allergen Reactions  . Codeine Nausea And Vomiting and Nausea Only    Other reaction(s): Unknown    Medications: I have reviewed the patient's current medications.  Results for orders placed or performed during the hospital encounter of 12/11/17 (from the past 48 hour(s))  Type and screen [facility]     Status: None   Collection Time: 12/11/17  3:52 PM  Result Value Ref Range   ABO/RH(D) A POS    Antibody Screen NEG    Sample Expiration      12/14/2017 Performed at [facility], [place] [address], [place],  [address]   Basic metabolic panel     Status: Abnormal   Collection Time: 12/11/17  4:33 PM  Result Value Ref Range   Sodium 136 135 - 145 mmol/L   Potassium 4.2 3.5 - 5.1 mmol/L   Chloride 104 98 - 111 mmol/L   CO2 25 22 - 32 mmol/L   Glucose, Bld 105 (H) 70 - 99 mg/dL   BUN 28 (H) 8 - 23 mg/dL   Creatinine, Ser 1.02 (H) 0.44 - 1.00 mg/dL   Calcium 9.5 8.9 - 10.3 mg/dL   GFR calc non Af Amer 51 (L) >60 mL/min   GFR calc  Af Amer 59 (L) >60 mL/min    Comment: (NOTE) The eGFR has been calculated using the CKD EPI equation. This calculation has not been validated in all clinical situations. eGFR's persistently <60 mL/min signify possible Chronic Kidney Disease.    Anion gap 7 5 - 15    Comment: Performed at [facility] [address], [place], [place] [address]  CBC WITH DIFFERENTIAL     Status: None   Collection Time: 12/11/17  4:33 PM  Result Value Ref Range   WBC 10.0 4.0 - 10.5 K/uL   RBC 4.31 3.87 - 5.11 MIL/uL   Hemoglobin 13.4 12.0 - 15.0 g/dL   HCT 40.7 36.0 - 46.0 %   MCV 94.4 80.0 - 100.0 fL   MCH 31.1 26.0 - 34.0 pg   MCHC 32.9 30.0 - 36.0 g/dL   RDW 12.3 11.5 - 15.5 %   Platelets 218 150 - 400 K/uL   nRBC 0.0 0.0 - 0.2 %   Neutrophils Relative % 69 %   Neutro Abs 6.8 1.7 - 7.7 K/uL   Lymphocytes Relative 20 %   Lymphs Abs 2.0 0.7 -  4.0 K/uL   Monocytes Relative 10 %   Monocytes Absolute 1.0 0.1 - 1.0 K/uL   Eosinophils Relative 1 %   Eosinophils Absolute 0.1 0.0 - 0.5 K/uL   Basophils Relative 0 %   Basophils Absolute 0.0 0.0 - 0.1 K/uL   Immature Granulocytes 0 %   Abs Immature Granulocytes 0.04 0.00 - 0.07 K/uL    Comment: Performed at [facility], [address] [place], [place] [address]  Protime-INR     Status: None   Collection Time: 12/11/17  4:33 PM  Result Value Ref Range   Prothrombin Time 13.0 11.4 - 15.2 seconds   INR 0.99     Comment: Performed at [facility] [address] [place], [place] [address]  ABO/Rh     Status: None (Preliminary result)   Collection Time: 12/11/17  4:33 PM  Result Value Ref Range   ABO/RH(D)      A POS Performed at [facility] [address] [place],  [address]     Dg Chest Port 1 View  Result Date: 12/11/2017 CLINICAL DATA:  77 year old who fell onto her LEFT side while at a theater earlier today and sustained a LEFT femoral neck fracture. Preoperative respiratory evaluation. EXAM: PORTABLE CHEST 1 VIEW COMPARISON:  01/16/2017, 01/10/2014, 08/22/2007. FINDINGS: Cardiac silhouette normal in size, unchanged. Thoracic aorta mildly atherosclerotic, unchanged. Hilar and mediastinal contours otherwise unremarkable. Stable mild chronic elevation of the RIGHT hemidiaphragm. Lungs clear. Bronchovascular markings normal. Pulmonary vascularity normal. No visible pleural effusions. No pneumothorax. IMPRESSION: 1.  No acute cardiopulmonary disease. 2.  Aortic Atherosclerosis (ICD10-170.0) Electronically Signed   By: Evangeline Dakin M.D.   On: 12/11/2017 17:54   Dg Hip Unilat With Pelvis 2-3 Views Left  Result Date: 12/11/2017 CLINICAL DATA:  Left hip pain after falling today. EXAM: DG HIP (WITH OR WITHOUT PELVIS) 2-3V LEFT COMPARISON:  Pelvic and right hip radiographs 03/19/2017. FINDINGS: The bones appear mildly demineralized. There is an acute  fracture of the mid left femoral neck with resulting moderate varus angulation. The left femoral head remains located. There is no evidence of pelvic fracture. Mild degenerative changes are present at both hips and sacroiliac joints. There are moderate degenerative changes in the visualized lower lumbar spine. Multiple pelvic calcifications are similar to previous study, likely phleboliths.  IMPRESSION: Acute, moderately angulated fracture of the mid left femoral neck. Electronically Signed   By: Richardean Sale M.D.   On: 12/11/2017 17:54    Review of Systems  Constitutional: Negative.   HENT: Negative.   Eyes: Negative.   Respiratory: Negative.   Cardiovascular: Negative.   Gastrointestinal: Negative.   Genitourinary: Negative.   Musculoskeletal: Positive for falls and joint pain.  Skin: Negative.   Neurological: Negative.   Endo/Heme/Allergies: Negative.   Psychiatric/Behavioral: Negative.    Blood pressure 110/67, pulse 77, temperature 98 F (36.7 C), temperature source Oral, resp. rate 12, height 5' 7" (1.702 m), weight 76.2 kg, SpO2 99 %. Physical Exam  Constitutional: She is oriented to person, place, and time. She appears well-developed.  HENT:  Head: Normocephalic.  Eyes: EOM are normal.  Neck: Normal range of motion.  Cardiovascular: Normal rate and intact distal pulses.  Respiratory: Effort normal.  GI: Soft.  Genitourinary:  Genitourinary Comments: Deferred  Musculoskeletal:  No ecchymosis of left hip or other areas. Tender to touch over her left hip. Join pain with minimal ROM of left hip. Calf is soft. Compartments are soft.  2+ pedal pulse. No knee or back pain.  Neurological: She is alert and oriented to person, place, and time.  Skin: Skin is warm and dry.  Psychiatric: Her behavior is normal.    Assessment/Plan:  Left Femoral neck fracture:  Plan for surgery tomorrow by Dr.Swinteck Patient understands to importance of surgical intervention and risk and  benefits. She wishes to proceed as will be consented. Dr.Swinteck to follow up for further discussion about treatment. NPO MN Bedrest Scd's Pain management Admitted to medicine Questions encouraged and answered in detail. Doria Fern L 12/11/2017, 8:03 PM

## 2017-12-11 NOTE — H&P (Signed)
History and Physical    [name]:[id number] DOB: August 30, 1940 DOA: 12/11/2017  PCP: Vivi Barrack, MD  Patient coming from:  home  Chief Complaint:  Fall at theater  HPI: Shari Horn is a 77 y.o. female with medical history significant of hypothyroidism, hld, bipolar was going to the movies tonight and walking up the stairs in the dark when she misjudged grabbing the handrail and fell to the floor injuring her left side.  No LOC.  No recent illnesses.  Pt very independent and lives alone.  Has 2 children who are present in the ED and divorced.  She uses no walking assisting devices daily and has no cardiac history.  Pt found to have left femor fracture and referred for admission for surgical intervention.    Review of Systems: As per HPI otherwise 10 point review of systems negative.   Past Medical History:  Diagnosis Date  . Carotid artery disease ([place])   . Depression   . Dyslipidemia   . Hyperlipidemia   . Hypothyroidism   . 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 PVC's (premature ventricular contractions)     Past Surgical History:  Procedure Laterality Date  . BREAST BIOPSY    . BREAST EXCISIONAL BIOPSY Left 1990  . BREAST EXCISIONAL BIOPSY Left 1996  . Carotid Doppler  12/2011   0-49% RICA stenosis, 42-68% LICA stenosis  . TRANSTHORACIC ECHOCARDIOGRAM  12/2011   EF 34-19%, grade 1 diastolic dysfunction; trivial AV regurg; calcified MV annulus     reports that she quit smoking about 45 years ago. Her smoking use included cigarettes. She quit after 10.00 years of use. She has never used smokeless tobacco. She reports that she does not drink alcohol or use drugs.  Allergies  Allergen Reactions  . Codeine Nausea And Vomiting and Nausea Only    Other reaction(s): Unknown    Family History  Problem Relation Age of Onset  . Heart failure Mother   . Thyroid disease Mother   . Hypertension Mother   . Diabetes Mother   . Arthritis/Rheumatoid Father   . Heart Problems  Maternal Grandmother   . Heart disease Brother        valve replacement  . Heart disease Child   . Colon cancer Neg Hx   . Esophageal cancer Neg Hx   . Stomach cancer Neg Hx     Prior to Admission medications   Medication Sig Start Date End Date Taking? Authorizing Provider  cetirizine (ZYRTEC) 10 MG tablet Take 10 mg by mouth daily.    [provider]  cholecalciferol (VITAMIN D) 400 units TABS tablet Take 400 Units by mouth.    [provider]  clonazePAM (KLONOPIN) 0.5 MG tablet Take 0.5 mg by mouth 3 (three) times daily as needed for anxiety.    [provider]  ezetimibe (ZETIA) 10 MG tablet TAKE 1 TABLET(10 MG) BY MOUTH DAILY 07/24/16   Hilty, Nadean Corwin, MD  FLUoxetine (PROZAC) 20 MG tablet Take 60 mg by mouth daily.    [provider]  lamoTRIgine (LAMICTAL) 100 MG tablet Take 100 mg by mouth daily.    [provider]  levothyroxine (SYNTHROID, LEVOTHROID) 75 MCG tablet Take 1 tablet (75 mcg total) by mouth daily. 11/17/17   Vivi Barrack, MD  meclizine (ANTIVERT) 25 MG tablet Take 1 tablet (25 mg total) by mouth 3 (three) times daily as needed for dizziness. 09/30/17   Orma Flaming, MD  Omega-3 Fatty  Acids (FISH OIL PO) Take 2,000 mg by mouth daily.     [provider]  pantoprazole (PROTONIX) 40 MG tablet Take 1 tablet (40 mg total) by mouth daily. 11/15/17   Vivi Barrack, MD  QUEtiapine (SEROQUEL) 400 MG tablet Take 400 mg by mouth at bedtime.  09/10/16   [provider]  rosuvastatin (CRESTOR) 40 MG tablet Take 1 tablet (40 mg total) by mouth daily. Please schedule appointment for further refills 11/23/17   Pixie Casino, MD  senna (SENOKOT) 8.6 MG TABS tablet Take 4 tablets by mouth at bedtime.     [provider]  traMADol (ULTRAM) 50 MG tablet Take 1 tablet (50 mg total) by mouth every 6 (six) hours as needed. 03/19/17   Aundra Dubin, PA-C    Physical Exam: Vitals:   12/11/17 1630 12/11/17 1700  12/11/17 1730 12/11/17 1800  BP: (!) 115/54 111/66 (!) 142/63 133/69  Pulse: 78 69 72 71  Resp:   17 16  Temp:      TempSrc:      SpO2: 99% 99% 100% 100%  Weight:      Height:          Constitutional: NAD, calm, comfortable Vitals:   12/11/17 1630 12/11/17 1700 12/11/17 1730 12/11/17 1800  BP: (!) 115/54 111/66 (!) 142/63 133/69  Pulse: 78 69 72 71  Resp:   17 16  Temp:      TempSrc:      SpO2: 99% 99% 100% 100%  Weight:      Height:       Eyes: PERRL, lids and conjunctivae normal ENMT: Mucous membranes are moist. Posterior pharynx clear of any exudate or lesions.Normal dentition.  Neck: normal, supple, no masses, no thyromegaly Respiratory: clear to auscultation bilaterally, no wheezing, no crackles. Normal respiratory effort. No accessory muscle use.  Cardiovascular: Regular rate and rhythm, no murmurs / rubs / gallops. No extremity edema. 2+ pedal pulses. No carotid bruits.  Abdomen: no tenderness, no masses palpated. No hepatosplenomegaly. Bowel sounds positive.  Musculoskeletal: no clubbing / cyanosis. No joint deformity upper and lower extremities. Good ROM, no contractures. Normal muscle tone.  Skin: no rashes, lesions, ulcers. No induration Neurologic: CN 2-12 grossly intact. Sensation intact, DTR normal. Strength 5/5 in all 4.  Psychiatric: Normal judgment and insight. Alert and oriented x 3. Normal mood.    Labs on Admission: I have personally reviewed following labs and imaging studies  CBC: Recent Labs  Lab 12/11/17 1633  WBC 10.0  NEUTROABS 6.8  HGB 13.4  HCT 40.7  MCV 94.4  PLT 034   Basic Metabolic Panel: Recent Labs  Lab 12/11/17 1633  NA 136  K 4.2  CL 104  CO2 25  GLUCOSE 105*  BUN 28*  CREATININE 1.02*  CALCIUM 9.5   GFR: Estimated Creatinine Clearance: 48.4 mL/min (A) (by C-G formula based on SCr of 1.02 mg/dL (H)). Liver Function Tests: No results for input(s): AST, ALT, ALKPHOS, BILITOT, PROT, ALBUMIN in the last 168 hours. No  results for input(s): LIPASE, AMYLASE in the last 168 hours. No results for input(s): AMMONIA in the last 168 hours. Coagulation Profile: Recent Labs  Lab 12/11/17 1633  INR 0.99   Cardiac Enzymes: No results for input(s): CKTOTAL, CKMB, CKMBINDEX, TROPONINI in the last 168 hours. BNP (last 3 results) No results for input(s): PROBNP in the last 8760 hours. HbA1C: No results for input(s): HGBA1C in the last 72 hours. CBG: No results for input(s): GLUCAP  in the last 168 hours. Lipid Profile: No results for input(s): CHOL, HDL, LDLCALC, TRIG, CHOLHDL, LDLDIRECT in the last 72 hours. Thyroid Function Tests: No results for input(s): TSH, T4TOTAL, FREET4, T3FREE, THYROIDAB in the last 72 hours. Anemia Panel: No results for input(s): VITAMINB12, FOLATE, FERRITIN, TIBC, IRON, RETICCTPCT in the last 72 hours. Urine analysis: No results found for: COLORURINE, APPEARANCEUR, LABSPEC, PHURINE, GLUCOSEU, HGBUR, BILIRUBINUR, KETONESUR, PROTEINUR, UROBILINOGEN, NITRITE, LEUKOCYTESUR Sepsis Labs: !!!!!!!!!!!!!!!!!!!!!!!!!!!!!!!!!!!!!!!!!!!! @LABRCNTIP (procalcitonin:4,lacticidven:4) )No results found for this or any previous visit (from the past 240 hour(s)).   Radiological Exams on Admission: Dg Chest Port 1 View  Result Date: 12/11/2017 CLINICAL DATA:  77 year old who fell onto her LEFT side while at a theater earlier today and sustained a LEFT femoral neck fracture. Preoperative respiratory evaluation. EXAM: PORTABLE CHEST 1 VIEW COMPARISON:  01/16/2017, 01/10/2014, 08/22/2007. FINDINGS: Cardiac silhouette normal in size, unchanged. Thoracic aorta mildly atherosclerotic, unchanged. Hilar and mediastinal contours otherwise unremarkable. Stable mild chronic elevation of the RIGHT hemidiaphragm. Lungs clear. Bronchovascular markings normal. Pulmonary vascularity normal. No visible pleural effusions. No pneumothorax. IMPRESSION: 1.  No acute cardiopulmonary disease. 2.  Aortic Atherosclerosis  (ICD10-170.0) Electronically Signed   By: Evangeline Dakin M.D.   On: 12/11/2017 17:54   Dg Hip Unilat With Pelvis 2-3 Views Left  Result Date: 12/11/2017 CLINICAL DATA:  Left hip pain after falling today. EXAM: DG HIP (WITH OR WITHOUT PELVIS) 2-3V LEFT COMPARISON:  Pelvic and right hip radiographs 03/19/2017. FINDINGS: The bones appear mildly demineralized. There is an acute fracture of the mid left femoral neck with resulting moderate varus angulation. The left femoral head remains located. There is no evidence of pelvic fracture. Mild degenerative changes are present at both hips and sacroiliac joints. There are moderate degenerative changes in the visualized lower lumbar spine. Multiple pelvic calcifications are similar to previous study, likely phleboliths. IMPRESSION: Acute, moderately angulated fracture of the mid left femoral neck. Electronically Signed   By: Richardean Sale M.D.   On: 12/11/2017 17:54    EKG: Independently reviewed. nsr qtc of 487 Old chart reviewed Case discussed with dr Wille Glaser knapp in the ED cxr personally reviewed no edema or infiltrate   Assessment/Plan 77 yo female with mechanical fall resulting in left femur fracture  Principal Problem:   Hip fracture ([place])- femur fx.  Npo x midnight.  Ortho called and plan for OR in the am.  Morphine ordered.    Active Problems:   Dyslipidemia- clarify home meds   Bipolar 2 disorder ([place])- clarify home meds   Hypothyroidism- same   Osteopenia- noted   Med list pending pharm clarification  Had full discussion with pt and her children about her code status.  She does have a living will but states full code for treatable illnesses.  She is full code.  Also discussed they should expect [facility] after at least 3 day hospitalization.    DVT prophylaxis: scds Code Status:  full Family Communication:  Both children Disposition Plan:  3-4 days Consults called: orthopedic Admission status:  admission   Mukhtar Shams A MD [facility]  If 7PM-7AM, please contact night-coverage [email or web address] Password [facility]  12/11/2017, 7:26 PM

## 2017-12-11 NOTE — H&P (View-Only) (Signed)
**Note Shari-Identified via Obfuscation** Reason for Consult:left hip fracture Referring Physician: Dr.David and [facility] Shari Horn is an 77 y.o. female.  HPI: Patient presented to [facility] this afternoon following a fall. Denies Syncope or CP. She was at the movie theater and when walking she missed a step causing her to far on her left side. She had immediate left hip pain. She was unable to bear weight. No other reports of pain in other areas.  She was brought to [facility] via EMS. Following examiation and imaging confirmed a left hip femoral neck fracture. She was admitted to medicine. History of bipolar and as documented below.  No current CP, SOB, dizziness or HA. She lives independently without assistive devices. She request Dr.Aluisio's group on a friends recommendation. She has seen Dr.Xu in the past for multiple issues.   Past Medical History:  Diagnosis Date  . Carotid artery disease ([place])   . Depression   . Dyslipidemia   . Hyperlipidemia   . Hypothyroidism   . 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 PVC's (premature ventricular contractions)     Past Surgical History:  Procedure Laterality Date  . BREAST BIOPSY    . BREAST EXCISIONAL BIOPSY Left 1990  . BREAST EXCISIONAL BIOPSY Left 1996  . Carotid Doppler  12/2011   0-49% RICA stenosis, 76-16% LICA stenosis  . TRANSTHORACIC ECHOCARDIOGRAM  12/2011   EF 07-37%, grade 1 diastolic dysfunction; trivial AV regurg; calcified MV annulus    Family History  Problem Relation Age of Onset  . Heart failure Mother   . Thyroid disease Mother   . Hypertension Mother   . Diabetes Mother   . Arthritis/Rheumatoid Father   . Heart Problems Maternal Grandmother   . Heart disease Brother        valve replacement  . Heart disease Child   . Colon cancer Neg Hx   . Esophageal cancer Neg Hx   . Stomach cancer Neg Hx     Social History:  reports that she quit smoking about 45 years ago. Her smoking use included cigarettes. She quit after 10.00 years of use. She has never used smokeless tobacco.  She reports that she does not drink alcohol or use drugs.  Allergies:  Allergies  Allergen Reactions  . Codeine Nausea And Vomiting and Nausea Only    Other reaction(s): Unknown    Medications: I have reviewed the patient's current medications.  Results for orders placed or performed during the hospital encounter of 12/11/17 (from the past 48 hour(s))  Type and screen [place]     Status: None   Collection Time: 12/11/17  3:52 PM  Result Value Ref Range   ABO/RH(D) A POS    Antibody Screen NEG    Sample Expiration      12/14/2017 Performed at [facility], [place] [address] [place], [place] [address]   Basic metabolic panel     Status: Abnormal   Collection Time: 12/11/17  4:33 PM  Result Value Ref Range   Sodium 136 135 - 145 mmol/L   Potassium 4.2 3.5 - 5.1 mmol/L   Chloride 104 98 - 111 mmol/L   CO2 25 22 - 32 mmol/L   Glucose, Bld 105 (H) 70 - 99 mg/dL   BUN 28 (H) 8 - 23 mg/dL   Creatinine, Ser 1.02 (H) 0.44 - 1.00 mg/dL   Calcium 9.5 8.9 - 10.3 mg/dL   GFR calc non Af Amer 51 (L) >60 mL/min   GFR calc  Af Amer 59 (L) >60 mL/min    Comment: (NOTE) The eGFR has been calculated using the CKD EPI equation. This calculation has not been validated in all clinical situations. eGFR's persistently <60 mL/min signify possible Chronic Kidney Disease.    Anion gap 7 5 - 15    Comment: Performed at [facility] [address] [place], [place] [address]  CBC WITH DIFFERENTIAL     Status: None   Collection Time: 12/11/17  4:33 PM  Result Value Ref Range   WBC 10.0 4.0 - 10.5 K/uL   RBC 4.31 3.87 - 5.11 MIL/uL   Hemoglobin 13.4 12.0 - 15.0 g/dL   HCT 40.7 36.0 - 46.0 %   MCV 94.4 80.0 - 100.0 fL   MCH 31.1 26.0 - 34.0 pg   MCHC 32.9 30.0 - 36.0 g/dL   RDW 12.3 11.5 - 15.5 %   Platelets 218 150 - 400 K/uL   nRBC 0.0 0.0 - 0.2 %   Neutrophils Relative % 69 %   Neutro Abs 6.8 1.7 - 7.7 K/uL   Lymphocytes Relative 20 %   Lymphs Abs 2.0 0.7 -  4.0 K/uL   Monocytes Relative 10 %   Monocytes Absolute 1.0 0.1 - 1.0 K/uL   Eosinophils Relative 1 %   Eosinophils Absolute 0.1 0.0 - 0.5 K/uL   Basophils Relative 0 %   Basophils Absolute 0.0 0.0 - 0.1 K/uL   Immature Granulocytes 0 %   Abs Immature Granulocytes 0.04 0.00 - 0.07 K/uL    Comment: Performed at [facility], [address], [place], [place] [address]  Protime-INR     Status: None   Collection Time: 12/11/17  4:33 PM  Result Value Ref Range   Prothrombin Time 13.0 11.4 - 15.2 seconds   INR 0.99     Comment: Performed at [facility] [address] [place], [place] [address]  ABO/Rh     Status: None (Preliminary result)   Collection Time: 12/11/17  4:33 PM  Result Value Ref Range   ABO/RH(D)      A POS Performed at [facility] [address] [place], [place] [address]     Dg Chest Port 1 View  Result Date: 12/11/2017 CLINICAL DATA:  77 year old who fell onto her LEFT side while at a theater earlier today and sustained a LEFT femoral neck fracture. Preoperative respiratory evaluation. EXAM: PORTABLE CHEST 1 VIEW COMPARISON:  01/16/2017, 01/10/2014, 08/22/2007. FINDINGS: Cardiac silhouette normal in size, unchanged. Thoracic aorta mildly atherosclerotic, unchanged. Hilar and mediastinal contours otherwise unremarkable. Stable mild chronic elevation of the RIGHT hemidiaphragm. Lungs clear. Bronchovascular markings normal. Pulmonary vascularity normal. No visible pleural effusions. No pneumothorax. IMPRESSION: 1.  No acute cardiopulmonary disease. 2.  Aortic Atherosclerosis (ICD10-170.0) Electronically Signed   By: Evangeline Dakin M.D.   On: 12/11/2017 17:54   Dg Hip Unilat With Pelvis 2-3 Views Left  Result Date: 12/11/2017 CLINICAL DATA:  Left hip pain after falling today. EXAM: DG HIP (WITH OR WITHOUT PELVIS) 2-3V LEFT COMPARISON:  Pelvic and right hip radiographs 03/19/2017. FINDINGS: The bones appear mildly demineralized. There is an acute  fracture of the mid left femoral neck with resulting moderate varus angulation. The left femoral head remains located. There is no evidence of pelvic fracture. Mild degenerative changes are present at both hips and sacroiliac joints. There are moderate degenerative changes in the visualized lower lumbar spine. Multiple pelvic calcifications are similar to previous study, likely phleboliths.  IMPRESSION: Acute, moderately angulated fracture of the mid left femoral neck. Electronically Signed   By: Richardean Sale M.D.   On: 12/11/2017 17:54    Review of Systems  Constitutional: Negative.   HENT: Negative.   Eyes: Negative.   Respiratory: Negative.   Cardiovascular: Negative.   Gastrointestinal: Negative.   Genitourinary: Negative.   Musculoskeletal: Positive for falls and joint pain.  Skin: Negative.   Neurological: Negative.   Endo/Heme/Allergies: Negative.   Psychiatric/Behavioral: Negative.    Blood pressure 110/67, pulse 77, temperature 98 F (36.7 C), temperature source Oral, resp. rate 12, height 5' 7" (1.702 m), weight 76.2 kg, SpO2 99 %. Physical Exam  Constitutional: She is oriented to person, place, and time. She appears well-developed.  HENT:  Head: Normocephalic.  Eyes: EOM are normal.  Neck: Normal range of motion.  Cardiovascular: Normal rate and intact distal pulses.  Respiratory: Effort normal.  GI: Soft.  Genitourinary:  Genitourinary Comments: Deferred  Musculoskeletal:  No ecchymosis of left hip or other areas. Tender to touch over her left hip. Join pain with minimal ROM of left hip. Calf is soft. Compartments are soft.  2+ pedal pulse. No knee or back pain.  Neurological: She is alert and oriented to person, place, and time.  Skin: Skin is warm and dry.  Psychiatric: Her behavior is normal.    Assessment/Plan:  Left Femoral neck fracture:  Plan for surgery tomorrow by Dr.Swinteck Patient understands to importance of surgical intervention and risk and  benefits. She wishes to proceed as will be consented. Dr.Swinteck to follow up for further discussion about treatment. NPO MN Bedrest Scd's Pain management Admitted to medicine Questions encouraged and answered in detail. STILWELL, BRYSON L 12/11/2017, 8:03 PM

## 2017-12-11 NOTE — ED Notes (Signed)
repaged [place]

## 2017-12-11 NOTE — ED Provider Notes (Signed)
[facility] EMERGENCY DEPARTMENT Provider Note   CSN: [id number] Arrival date & time: 12/11/17  1517     History   Chief Complaint Chief Complaint  Patient presents with  . Fall    HPI Shari Horn is a 77 y.o. female.  HPI Pt was walking in the movie theatre when she reached for a chair to balance herself and she missed the chair and ended up falling onto the left side.  She tried to stand but was unable. Pt may have passed out when she tried to get up.  She has pain in her left hip.  No CP or headache.  No abd pain.  Past Medical History:  Diagnosis Date  . Carotid artery disease ([place])   . Depression   . Dyslipidemia   . Hyperlipidemia   . Hypothyroidism   . 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 PVC's (premature ventricular contractions)     Patient Active Problem List   Diagnosis Date Noted  . Closed displaced fracture of left femoral neck ([place]) 12/12/2017  . Hip fracture ([place]) 12/11/2017  . Trochanteric bursitis of right hip 03/19/2017  . Rib pain on right side 03/19/2017  . Forgetfulness 03/10/2017  . Frequent falls 01/25/2017  . Gastroesophageal reflux disease without esophagitis 10/12/2016  . Osteopenia 09/17/2016  . Dysphagia 09/14/2016  . Bipolar 2 disorder ([place]) 09/14/2016  . Hypothyroidism 09/14/2016  . New onset of headaches after age 71 05/27/2016  . Positional vertigo 09/08/2013  . 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 PVC's (premature ventricular contractions) 12/20/2012  . Dyslipidemia 12/20/2012    Past Surgical History:  Procedure Laterality Date  . BREAST BIOPSY    . BREAST EXCISIONAL BIOPSY Left 1990  . BREAST EXCISIONAL BIOPSY Left 1996  . Carotid Doppler  12/2011   0-49% RICA stenosis, 74-08% LICA stenosis  . TRANSTHORACIC ECHOCARDIOGRAM  12/2011   EF 14-48%, grade 1 diastolic dysfunction; trivial AV regurg; calcified MV annulus     OB History   None      Home Medications    Prior to Admission  medications   Medication Sig Start Date End Date Taking? Authorizing Provider  aspirin EC 81 MG tablet Take 1 tablet (81 mg total) by mouth 2 (two) times daily. 12/12/17 01/26/18  Cecilie Kicks, PA-C  cetirizine (ZYRTEC) 10 MG tablet Take 10 mg by mouth daily.    [provider]  cholecalciferol (VITAMIN D) 400 units TABS tablet Take 400 Units by mouth.    [provider]  clonazePAM (KLONOPIN) 0.5 MG tablet Take 0.5 mg by mouth 3 (three) times daily as needed for anxiety.    [provider]  ezetimibe (ZETIA) 10 MG tablet TAKE 1 TABLET(10 MG) BY MOUTH DAILY 07/24/16   Hilty, Nadean Corwin, MD  FLUoxetine (PROZAC) 20 MG tablet Take 60 mg by mouth daily.    [provider]  HYDROcodone-acetaminophen (NORCO) 5-325 MG tablet Take 1-2 tablets by mouth every 6 (six) hours as needed for moderate pain. 12/12/17   Cecilie Kicks, PA-C  lamoTRIgine (LAMICTAL) 100 MG tablet Take 100 mg by mouth daily.    [provider]  levothyroxine (SYNTHROID, LEVOTHROID) 75 MCG tablet Take 1 tablet (75 mcg total) by mouth daily. 11/17/17   Vivi Barrack, MD  meclizine (ANTIVERT) 25 MG tablet Take 1 tablet (25 mg total) by mouth 3 (three) times daily as needed for dizziness. 09/30/17   Orma Flaming, MD  Omega-3 Fatty  Acids (FISH OIL PO) Take 2,000 mg by mouth daily.     [provider]  pantoprazole (PROTONIX) 40 MG tablet Take 1 tablet (40 mg total) by mouth daily. 11/15/17   Vivi Barrack, MD  QUEtiapine (SEROQUEL) 400 MG tablet Take 400 mg by mouth at bedtime.  09/10/16   [provider]  rosuvastatin (CRESTOR) 40 MG tablet Take 1 tablet (40 mg total) by mouth daily. Please schedule appointment for further refills 11/23/17   Pixie Casino, MD  senna (SENOKOT) 8.6 MG TABS tablet Take 4 tablets by mouth at bedtime.     [provider]  traMADol (ULTRAM) 50 MG tablet Take 1 tablet (50 mg total) by mouth every 6 (six) hours as needed. 03/19/17    Aundra Dubin, PA-C    Family History Family History  Problem Relation Age of Onset  . Heart failure Mother   . Thyroid disease Mother   . Hypertension Mother   . Diabetes Mother   . Arthritis/Rheumatoid Father   . Heart Problems Maternal Grandmother   . Heart disease Brother        valve replacement  . Heart disease Child   . Colon cancer Neg Hx   . Esophageal cancer Neg Hx   . Stomach cancer Neg Hx     Social History Social History   Tobacco Use  . Smoking status: Former Smoker    Years: 10.00    Types: Cigarettes    Last attempt to quit: 12/19/1972    Years since quitting: 45.0  . Smokeless tobacco: Never Used  Substance Use Topics  . Alcohol use: No    Comment: quit in 1973  . Drug use: No     Allergies   Codeine   Review of Systems Review of Systems  All other systems reviewed and are negative.    Physical Exam Updated Vital Signs BP (!) 129/53   Pulse 78   Temp (!) 97.2 F (36.2 C)   Resp 19   Ht 1.702 m (5\' 7" )   Wt 76.2 kg   LMP  (LMP Unknown)   SpO2 98%   BMI 26.31 kg/m   Physical Exam  Constitutional: She appears well-developed and well-nourished. No distress.  HENT:  Head: Normocephalic and atraumatic.  Right Ear: External ear normal.  Left Ear: External ear normal.  Eyes: Conjunctivae are normal. Right eye exhibits no discharge. Left eye exhibits no discharge. No scleral icterus.  Neck: Neck supple. No tracheal deviation present.  Cardiovascular: Normal rate, regular rhythm and intact distal pulses.  Pulmonary/Chest: Effort normal and breath sounds normal. No stridor. No respiratory distress. She has no wheezes. She has no rales.  Abdominal: Soft. Bowel sounds are normal. She exhibits no distension. There is no tenderness. There is no rebound and no guarding.  Musculoskeletal: She exhibits no edema.       Left hip: She exhibits tenderness and bony tenderness.  lle shortened  Neurological: She is alert. She has normal strength.  No cranial nerve deficit (no facial droop, extraocular movements intact, no slurred speech) or sensory deficit. She exhibits normal muscle tone. She displays no seizure activity. Coordination normal.  Skin: Skin is warm and dry. No rash noted.  Psychiatric: She has a normal mood and affect.  Nursing note and vitals reviewed.    ED Treatments / Results  Labs (all labs ordered are listed, but only abnormal results are displayed) Labs Reviewed  BASIC METABOLIC PANEL - Abnormal; Notable for the following  components:      Result Value   Glucose, Bld 105 (*)    BUN 28 (*)    Creatinine, Ser 1.02 (*)    GFR calc non Af Amer 51 (*)    GFR calc Af Amer 59 (*)    All other components within normal limits  BASIC METABOLIC PANEL - Abnormal; Notable for the following components:   Sodium 131 (*)    CO2 21 (*)    GFR calc non Af Amer 54 (*)    All other components within normal limits  CBC - Abnormal; Notable for the following components:   WBC 11.3 (*)    RBC 3.83 (*)    Hemoglobin 11.5 (*)    All other components within normal limits  SURGICAL PCR SCREEN  CBC WITH DIFFERENTIAL/PLATELET  PROTIME-INR  CBC  CREATININE, SERUM  TYPE AND SCREEN  ABO/RH    EKG EKG Interpretation  Date/Time:  Saturday December 11 2017 17:53:57 EST Ventricular Rate:  68 PR Interval:    QRS Duration: 83 QT Interval:  457 QTC Calculation: 487 R Axis:   68 Text Interpretation:  Sinus rhythm Borderline prolonged QT interval No significant change since last tracing Confirmed by Dorie Rank [id number]) on 12/11/2017 5:58:54 PM   Radiology Pelvis Portable  Result Date: 12/12/2017 CLINICAL DATA:  Status post LEFT hip arthroplasty EXAM: PORTABLE PELVIS 1-2 VIEWS COMPARISON:  12/12/2017 FINDINGS: LEFT hip arthroplasty changes noted without complicating features. No acute fracture or dislocation. Degenerative changes in the LOWER lumbar spine noted. IMPRESSION: LEFT hip arthroplasty without complicating features.  Electronically Signed   By: Margarette Canada M.D.   On: 12/12/2017 14:12   Dg Chest Port 1 View  Result Date: 12/11/2017 CLINICAL DATA:  77 year old who fell onto her LEFT side while at a theater earlier today and sustained a LEFT femoral neck fracture. Preoperative respiratory evaluation. EXAM: PORTABLE CHEST 1 VIEW COMPARISON:  01/16/2017, 01/10/2014, 08/22/2007. FINDINGS: Cardiac silhouette normal in size, unchanged. Thoracic aorta mildly atherosclerotic, unchanged. Hilar and mediastinal contours otherwise unremarkable. Stable mild chronic elevation of the RIGHT hemidiaphragm. Lungs clear. Bronchovascular markings normal. Pulmonary vascularity normal. No visible pleural effusions. No pneumothorax. IMPRESSION: 1.  No acute cardiopulmonary disease. 2.  Aortic Atherosclerosis (ICD10-170.0) Electronically Signed   By: Evangeline Dakin M.D.   On: 12/11/2017 17:54   Dg Knee Complete 4 Views Left  Result Date: 12/12/2017 CLINICAL DATA:  Fall, status post hip fracture. EXAM: LEFT KNEE - COMPLETE 4+ VIEW COMPARISON:  None. FINDINGS: Four views of the LEFT knee are provided. Osseous alignment is normal. No fracture line or displaced fracture fragment seen. Faint calcifications within the joint space is compatible with chondrocalcinosis suggesting underlying CPPD. No large osteophytes or other signs of advanced DJD. No appreciable joint effusion and adjacent soft tissues are unremarkable. IMPRESSION: No acute findings. Electronically Signed   By: Franki Cabot M.D.   On: 12/12/2017 00:01   Dg C-arm 1-60 Min  Result Date: 12/12/2017 CLINICAL DATA:  Total hip arthroplasty LEFT EXAM: DG C-ARM 61-120 MIN; OPERATIVE RIGHT HIP WITH PELVIS COMPARISON:  12/11/2017 FLUOROSCOPY TIME:  0 minutes 18 seconds Images obtained: 3 FINDINGS: Images demonstrate placement of a LEFT hip prosthesis. Osseous mineralization grossly normal for technique. No fracture or dislocation identified. IMPRESSION: Post LEFT hip arthroplasty without  acute abnormality. Electronically Signed   By: Lavonia Dana M.D.   On: 12/12/2017 10:54   Dg Hip Operative Unilat W Or W/o Pelvis Right  Result Date: 12/12/2017 CLINICAL DATA:  Total  hip arthroplasty LEFT EXAM: DG C-ARM 61-120 MIN; OPERATIVE RIGHT HIP WITH PELVIS COMPARISON:  12/11/2017 FLUOROSCOPY TIME:  0 minutes 18 seconds Images obtained: 3 FINDINGS: Images demonstrate placement of a LEFT hip prosthesis. Osseous mineralization grossly normal for technique. No fracture or dislocation identified. IMPRESSION: Post LEFT hip arthroplasty without acute abnormality. Electronically Signed   By: Lavonia Dana M.D.   On: 12/12/2017 10:54   Dg Hip Unilat With Pelvis 2-3 Views Left  Result Date: 12/11/2017 CLINICAL DATA:  Left hip pain after falling today. EXAM: DG HIP (WITH OR WITHOUT PELVIS) 2-3V LEFT COMPARISON:  Pelvic and right hip radiographs 03/19/2017. FINDINGS: The bones appear mildly demineralized. There is an acute fracture of the mid left femoral neck with resulting moderate varus angulation. The left femoral head remains located. There is no evidence of pelvic fracture. Mild degenerative changes are present at both hips and sacroiliac joints. There are moderate degenerative changes in the visualized lower lumbar spine. Multiple pelvic calcifications are similar to previous study, likely phleboliths. IMPRESSION: Acute, moderately angulated fracture of the mid left femoral neck. Electronically Signed   By: Richardean Sale M.D.   On: 12/11/2017 17:54    Procedures Procedures (including critical care time)  Medications Ordered in ED Medications  docusate sodium (COLACE) capsule 100 mg (100 mg Oral Given 12/12/17 1410)  ondansetron (ZOFRAN) tablet 4 mg (has no administration in time range)    Or  ondansetron (ZOFRAN) injection 4 mg (has no administration in time range)  metoCLOPramide (REGLAN) tablet 5-10 mg (has no administration in time range)    Or  metoCLOPramide (REGLAN) injection 5-10 mg  (has no administration in time range)  menthol-cetylpyridinium (CEPACOL) lozenge 3 mg (has no administration in time range)    Or  phenol (CHLORASEPTIC) mouth spray 1 spray (has no administration in time range)  enoxaparin (LOVENOX) injection 40 mg (has no administration in time range)  ceFAZolin (ANCEF) IVPB 2g/100 mL premix (has no administration in time range)  acetaminophen (TYLENOL) tablet 325-650 mg (has no administration in time range)  HYDROcodone-acetaminophen (NORCO/VICODIN) 5-325 MG per tablet 1-2 tablet (1 tablet Oral Given 12/12/17 1412)  HYDROcodone-acetaminophen (NORCO) 7.5-325 MG per tablet 1-2 tablet (has no administration in time range)  morphine 2 MG/ML injection 0.5-1 mg (has no administration in time range)  ketorolac (TORADOL) 15 MG/ML injection 7.5 mg (7.5 mg Intravenous Given 12/12/17 1407)  methocarbamol (ROBAXIN) tablet 500 mg (has no administration in time range)    Or  methocarbamol (ROBAXIN) 500 mg in dextrose 5 % 50 mL IVPB (has no administration in time range)  senna (SENOKOT) tablet 8.6 mg (8.6 mg Oral Given 12/12/17 1410)  morphine 4 MG/ML injection 4 mg (4 mg Intravenous Given 12/11/17 1754)  ondansetron (ZOFRAN) injection 4 mg (4 mg Intravenous Given 12/11/17 1645)  chlorhexidine (HIBICLENS) 4 % liquid 4 application (4 application Topical Given 12/12/17 0450)  ceFAZolin (ANCEF) IVPB 2g/100 mL premix (2 g Intravenous Given 12/12/17 0840)  tranexamic acid (CYKLOKAPRON) IVPB 1,000 mg (1,000 mg Intravenous Given 12/12/17 0858)  acetaminophen (OFIRMEV) IV 1,000 mg (1,000 mg Intravenous Given 12/12/17 1017)  0.9 %  sodium chloride infusion (1,000 mLs Injection New Bag/Given 12/12/17 0915)     Initial Impression / Assessment and Plan / ED Course  I have reviewed the triage vital signs and the nursing notes.  Pertinent labs & imaging results that were available during my care of the patient were reviewed by me and considered in my medical decision making (see  chart for  details).   Pt s/p mechanical fall.  Complains of left hip pain.  No other injuries.  Xray shows a left femoral neck fracture.  Labs normal. Admit to medical service.  Ortho consult.   Final Clinical Impressions(s) / ED Diagnoses   Final diagnoses:  Closed fracture of left hip, initial encounter [facility])  Status post total hip replacement, left      Dorie Rank, MD 12/12/17 1523

## 2017-12-11 NOTE — ED Triage Notes (Signed)
Pt presents to ED via [facility].  Pt was at the movies when she had a fall.  She attempted to grab the hand rail on the stairs and missed and fell.  Pt presents with left hip pain and external rotation.  Pt had a syncopal episode after she fell, possibly due to pain.  Pt presents A&Ox4 and pelvic binder in place.

## 2017-12-11 NOTE — ED Notes (Signed)
Patient transported to X-ray 

## 2017-12-11 NOTE — Progress Notes (Signed)
Consult received. Has displaced L femoral neck fx. Plan L THA tomorrow. NPO after MN. Hold chemical DVT ppx. Full consult to follow.

## 2017-12-12 ENCOUNTER — Inpatient Hospital Stay (HOSPITAL_COMMUNITY): Payer: Medicare Other | Admitting: Anesthesiology

## 2017-12-12 ENCOUNTER — Inpatient Hospital Stay (HOSPITAL_COMMUNITY): Payer: Medicare Other

## 2017-12-12 ENCOUNTER — Encounter (HOSPITAL_COMMUNITY)
Admission: EM | Disposition: A | Discharge status: Skilled Nursing Facility | Payer: Self-pay | Source: Home / Self Care | Attending: Internal Medicine

## 2017-12-12 DIAGNOSIS — S72002A Fracture of unspecified part of neck of left femur, initial encounter for closed fracture: Secondary | ICD-10-CM | POA: Diagnosis present

## 2017-12-12 DIAGNOSIS — S72002D Fracture of unspecified part of neck of left femur, subsequent encounter for closed fracture with routine healing: Secondary | ICD-10-CM

## 2017-12-12 HISTORY — PX: TOTAL HIP ARTHROPLASTY: SHX124

## 2017-12-12 LAB — CBC
HCT: 37.6 % (ref 36.0–46.0)
HCT: 36.6 % (ref 36.0–46.0)
HEMOGLOBIN: 12.5 g/dL (ref 12.0–15.0)
Hemoglobin: 11.5 g/dL — ABNORMAL LOW (ref 12.0–15.0)
MCH: 30.7 pg (ref 26.0–34.0)
MCH: 30 pg (ref 26.0–34.0)
MCHC: 33.2 g/dL (ref 30.0–36.0)
MCHC: 31.4 g/dL (ref 30.0–36.0)
MCV: 92.4 fL (ref 80.0–100.0)
MCV: 95.6 fL (ref 80.0–100.0)
NRBC: 0 % (ref 0.0–0.2)
NRBC: 0 % (ref 0.0–0.2)
PLATELETS: 205 10*3/uL (ref 150–400)
PLATELETS: 201 10*3/uL (ref 150–400)
RBC: 4.07 MIL/uL (ref 3.87–5.11)
RBC: 3.83 MIL/uL — ABNORMAL LOW (ref 3.87–5.11)
RDW: 12.5 % (ref 11.5–15.5)
RDW: 12.6 % (ref 11.5–15.5)
WBC: 8.8 10*3/uL (ref 4.0–10.5)
WBC: 11.3 10*3/uL — AB (ref 4.0–10.5)

## 2017-12-12 LAB — BASIC METABOLIC PANEL
Anion gap: 9 (ref 5–15)
BUN: 17 mg/dL (ref 8–23)
CHLORIDE: 101 mmol/L (ref 98–111)
CO2: 21 mmol/L — ABNORMAL LOW (ref 22–32)
Calcium: 9 mg/dL (ref 8.9–10.3)
Creatinine, Ser: 0.98 mg/dL (ref 0.44–1.00)
GFR calc non Af Amer: 54 mL/min — ABNORMAL LOW (ref >60)
Glucose, Bld: 95 mg/dL (ref 70–99)
POTASSIUM: 3.9 mmol/L (ref 3.5–5.1)
SODIUM: 131 mmol/L — AB (ref 135–145)

## 2017-12-12 LAB — SURGICAL PCR SCREEN
MRSA, PCR: NEGATIVE
Staphylococcus aureus: NEGATIVE

## 2017-12-12 LAB — CREATININE, SERUM: Creatinine, Ser: 0.81 mg/dL (ref 0.44–1.00)

## 2017-12-12 SURGERY — ARTHROPLASTY, HIP, TOTAL, ANTERIOR APPROACH
Anesthesia: General | Site: Hip | Laterality: Left

## 2017-12-12 MED ORDER — 0.9 % SODIUM CHLORIDE (POUR BTL) OPTIME
TOPICAL | Status: DC | PRN
  Administered 2017-12-12: 1000 mL

## 2017-12-12 MED ORDER — CLONAZEPAM 0.5 MG PO TABS: 0.5000 mg | ORAL_TABLET | Freq: Three times a day (TID) | ORAL | Status: DC | PRN

## 2017-12-12 MED ORDER — TRANEXAMIC ACID-NACL 1000-0.7 MG/100ML-% IV SOLN
1000.0000 mg | INTRAVENOUS | Status: AC
  Administered 2017-12-12: 1000 mg via INTRAVENOUS
  Filled 2017-12-12: qty 100

## 2017-12-12 MED ORDER — KETOROLAC TROMETHAMINE 30 MG/ML IJ SOLN
INTRAMUSCULAR | Status: DC | PRN
  Administered 2017-12-12: 30 mg via INTRAVENOUS

## 2017-12-12 MED ORDER — HYDROCODONE-ACETAMINOPHEN 5-325 MG PO TABS: 1.0000 | ORAL_TABLET | Freq: Four times a day (QID) | ORAL | 0 refills | Status: DC | PRN

## 2017-12-12 MED ORDER — CEFAZOLIN SODIUM-DEXTROSE 2-3 GM-%(50ML) IV SOLR: INTRAVENOUS | Status: DC | PRN

## 2017-12-12 MED ORDER — BUPIVACAINE-EPINEPHRINE (PF) 0.5% -1:200000 IJ SOLN
INTRAMUSCULAR | Status: AC
  Filled 2017-12-12: qty 90

## 2017-12-12 MED ORDER — ROSUVASTATIN CALCIUM 10 MG PO TABS
40.0000 mg | ORAL_TABLET | Freq: Every day | ORAL | Status: DC
  Administered 2017-12-12 – 2017-12-15 (×4): 40 mg via ORAL
  Filled 2017-12-12 (×4): qty 4

## 2017-12-12 MED ORDER — DEXAMETHASONE SODIUM PHOSPHATE 10 MG/ML IJ SOLN
INTRAMUSCULAR | Status: DC | PRN
  Administered 2017-12-12: 10 mg via INTRAVENOUS

## 2017-12-12 MED ORDER — LIDOCAINE 2% (20 MG/ML) 5 ML SYRINGE
INTRAMUSCULAR | Status: AC
  Filled 2017-12-12: qty 5

## 2017-12-12 MED ORDER — SODIUM CHLORIDE 0.9 % IV SOLN
INTRAVENOUS | Status: AC | PRN
  Administered 2017-12-12: 1000 mL via INTRAMUSCULAR

## 2017-12-12 MED ORDER — SODIUM CHLORIDE (PF) 0.9 % IJ SOLN
INTRAMUSCULAR | Status: DC | PRN
  Administered 2017-12-12: 30 mL via INTRAVENOUS

## 2017-12-12 MED ORDER — ONDANSETRON HCL 4 MG/2ML IJ SOLN: 4.0000 mg | Freq: Four times a day (QID) | INTRAMUSCULAR | Status: DC | PRN

## 2017-12-12 MED ORDER — KETOROLAC TROMETHAMINE 15 MG/ML IJ SOLN
7.5000 mg | Freq: Four times a day (QID) | INTRAMUSCULAR | Status: AC
  Administered 2017-12-12 – 2017-12-13 (×3): 7.5 mg via INTRAVENOUS
  Filled 2017-12-12 (×3): qty 1

## 2017-12-12 MED ORDER — OXYCODONE HCL 5 MG/5ML PO SOLN: 5.0000 mg | Freq: Once | ORAL | Status: DC | PRN

## 2017-12-12 MED ORDER — BUPIVACAINE-EPINEPHRINE (PF) 0.5% -1:200000 IJ SOLN
INTRAMUSCULAR | Status: DC | PRN
  Administered 2017-12-12: 30 mL via PERINEURAL

## 2017-12-12 MED ORDER — QUETIAPINE FUMARATE 400 MG PO TABS
400.0000 mg | ORAL_TABLET | Freq: Every day | ORAL | Status: DC
  Administered 2017-12-12: 400 mg via ORAL
  Filled 2017-12-12: qty 1

## 2017-12-12 MED ORDER — KETOROLAC TROMETHAMINE 15 MG/ML IJ SOLN
7.5000 mg | Freq: Four times a day (QID) | INTRAMUSCULAR | Status: DC
  Administered 2017-12-12: 7.5 mg via INTRAVENOUS
  Filled 2017-12-12 (×2): qty 1

## 2017-12-12 MED ORDER — SUGAMMADEX SODIUM 200 MG/2ML IV SOLN
INTRAVENOUS | Status: DC | PRN
  Administered 2017-12-12: 150 mg via INTRAVENOUS

## 2017-12-12 MED ORDER — FENTANYL CITRATE (PF) 250 MCG/5ML IJ SOLN
INTRAMUSCULAR | Status: AC
  Filled 2017-12-12: qty 5

## 2017-12-12 MED ORDER — LEVOTHYROXINE SODIUM 75 MCG PO TABS
75.0000 ug | ORAL_TABLET | Freq: Every day | ORAL | Status: DC
  Administered 2017-12-13 – 2017-12-15 (×3): 75 ug via ORAL
  Filled 2017-12-12 (×3): qty 1

## 2017-12-12 MED ORDER — ONDANSETRON HCL 4 MG/2ML IJ SOLN
INTRAMUSCULAR | Status: DC | PRN
  Administered 2017-12-12: 4 mg via INTRAVENOUS

## 2017-12-12 MED ORDER — LACTATED RINGERS IV SOLN
INTRAVENOUS | Status: DC | PRN
  Administered 2017-12-12 (×2): via INTRAVENOUS

## 2017-12-12 MED ORDER — FENTANYL CITRATE (PF) 250 MCG/5ML IJ SOLN
INTRAMUSCULAR | Status: DC | PRN
  Administered 2017-12-12: 50 ug via INTRAVENOUS
  Administered 2017-12-12: 100 ug via INTRAVENOUS
  Administered 2017-12-12 (×2): 50 ug via INTRAVENOUS

## 2017-12-12 MED ORDER — OMEGA-3-ACID ETHYL ESTERS 1 G PO CAPS
2.0000 g | ORAL_CAPSULE | Freq: Every day | ORAL | Status: DC
  Administered 2017-12-13 – 2017-12-15 (×3): 2 g via ORAL
  Filled 2017-12-12 (×3): qty 2

## 2017-12-12 MED ORDER — DOCUSATE SODIUM 100 MG PO CAPS
100.0000 mg | ORAL_CAPSULE | Freq: Two times a day (BID) | ORAL | Status: DC
  Administered 2017-12-12 – 2017-12-15 (×7): 100 mg via ORAL
  Filled 2017-12-12 (×7): qty 1

## 2017-12-12 MED ORDER — SENNA 8.6 MG PO TABS
1.0000 | ORAL_TABLET | Freq: Two times a day (BID) | ORAL | Status: DC
  Administered 2017-12-12 – 2017-12-15 (×7): 8.6 mg via ORAL
  Filled 2017-12-12 (×7): qty 1

## 2017-12-12 MED ORDER — CEFAZOLIN SODIUM-DEXTROSE 2-4 GM/100ML-% IV SOLN
2.0000 g | INTRAVENOUS | Status: AC
  Administered 2017-12-12: 2 g via INTRAVENOUS
  Filled 2017-12-12: qty 100

## 2017-12-12 MED ORDER — FENTANYL CITRATE (PF) 100 MCG/2ML IJ SOLN
25.0000 ug | INTRAMUSCULAR | Status: DC | PRN
  Administered 2017-12-12 (×2): 50 ug via INTRAVENOUS

## 2017-12-12 MED ORDER — EZETIMIBE 10 MG PO TABS
10.0000 mg | ORAL_TABLET | Freq: Every day | ORAL | Status: DC
  Administered 2017-12-12 – 2017-12-15 (×4): 10 mg via ORAL
  Filled 2017-12-12 (×4): qty 1

## 2017-12-12 MED ORDER — DEXAMETHASONE SODIUM PHOSPHATE 10 MG/ML IJ SOLN
INTRAMUSCULAR | Status: AC
  Filled 2017-12-12: qty 1

## 2017-12-12 MED ORDER — CHLORHEXIDINE GLUCONATE 4 % EX LIQD
60.0000 mL | Freq: Once | CUTANEOUS | Status: AC
  Administered 2017-12-12: 4 via TOPICAL
  Filled 2017-12-12: qty 60

## 2017-12-12 MED ORDER — CEFAZOLIN SODIUM-DEXTROSE 2-4 GM/100ML-% IV SOLN
2.0000 g | Freq: Four times a day (QID) | INTRAVENOUS | Status: AC
  Administered 2017-12-12 (×2): 2 g via INTRAVENOUS
  Filled 2017-12-12 (×2): qty 100

## 2017-12-12 MED ORDER — PROPOFOL 10 MG/ML IV BOLUS
INTRAVENOUS | Status: DC | PRN
  Administered 2017-12-12: 80 mg via INTRAVENOUS

## 2017-12-12 MED ORDER — MENTHOL 3 MG MT LOZG: 1.0000 | LOZENGE | OROMUCOSAL | Status: DC | PRN

## 2017-12-12 MED ORDER — ASPIRIN EC 81 MG PO TBEC: 81.0000 mg | DELAYED_RELEASE_TABLET | Freq: Two times a day (BID) | ORAL | 0 refills | Status: AC

## 2017-12-12 MED ORDER — ROCURONIUM BROMIDE 10 MG/ML (PF) SYRINGE
PREFILLED_SYRINGE | INTRAVENOUS | Status: DC | PRN
  Administered 2017-12-12: 40 mg via INTRAVENOUS

## 2017-12-12 MED ORDER — ONDANSETRON HCL 4 MG PO TABS: 4.0000 mg | ORAL_TABLET | Freq: Four times a day (QID) | ORAL | Status: DC | PRN

## 2017-12-12 MED ORDER — ACETAMINOPHEN 325 MG PO TABS: 325.0000 mg | ORAL_TABLET | Freq: Four times a day (QID) | ORAL | Status: DC | PRN

## 2017-12-12 MED ORDER — ONDANSETRON HCL 4 MG/2ML IJ SOLN
INTRAMUSCULAR | Status: AC
  Filled 2017-12-12: qty 2

## 2017-12-12 MED ORDER — ACETAMINOPHEN 10 MG/ML IV SOLN
1000.0000 mg | INTRAVENOUS | Status: AC
  Administered 2017-12-12: 1000 mg via INTRAVENOUS
  Filled 2017-12-12: qty 100

## 2017-12-12 MED ORDER — HYDROCODONE-ACETAMINOPHEN 7.5-325 MG PO TABS
1.0000 | ORAL_TABLET | ORAL | Status: DC | PRN
  Administered 2017-12-13: 1 via ORAL
  Filled 2017-12-12: qty 1

## 2017-12-12 MED ORDER — METHOCARBAMOL 1000 MG/10ML IJ SOLN
500.0000 mg | Freq: Four times a day (QID) | INTRAVENOUS | Status: DC | PRN
  Filled 2017-12-12: qty 5

## 2017-12-12 MED ORDER — PROPOFOL 10 MG/ML IV BOLUS
INTRAVENOUS | Status: AC
  Filled 2017-12-12: qty 20

## 2017-12-12 MED ORDER — SUGAMMADEX SODIUM 200 MG/2ML IV SOLN
INTRAVENOUS | Status: AC
  Filled 2017-12-12: qty 2

## 2017-12-12 MED ORDER — POVIDONE-IODINE 10 % EX SWAB
2.0000 "application " | Freq: Once | CUTANEOUS | Status: DC
  Administered 2017-12-12: 2 via TOPICAL

## 2017-12-12 MED ORDER — LAMOTRIGINE 100 MG PO TABS
100.0000 mg | ORAL_TABLET | Freq: Every day | ORAL | Status: DC
  Administered 2017-12-12 – 2017-12-15 (×4): 100 mg via ORAL
  Filled 2017-12-12 (×4): qty 1

## 2017-12-12 MED ORDER — PANTOPRAZOLE SODIUM 40 MG PO TBEC
40.0000 mg | DELAYED_RELEASE_TABLET | Freq: Every day | ORAL | Status: DC
  Administered 2017-12-12 – 2017-12-15 (×4): 40 mg via ORAL
  Filled 2017-12-12 (×4): qty 1

## 2017-12-12 MED ORDER — METOCLOPRAMIDE HCL 5 MG/ML IJ SOLN: 5.0000 mg | Freq: Three times a day (TID) | INTRAMUSCULAR | Status: DC | PRN

## 2017-12-12 MED ORDER — QUETIAPINE FUMARATE 300 MG PO TABS
300.0000 mg | ORAL_TABLET | Freq: Every day | ORAL | Status: DC
  Administered 2017-12-13 – 2017-12-14 (×2): 300 mg via ORAL
  Filled 2017-12-12 (×2): qty 1

## 2017-12-12 MED ORDER — METHOCARBAMOL 500 MG PO TABS: 500.0000 mg | ORAL_TABLET | Freq: Four times a day (QID) | ORAL | Status: DC | PRN

## 2017-12-12 MED ORDER — ENOXAPARIN SODIUM 40 MG/0.4ML ~~LOC~~ SOLN
40.0000 mg | SUBCUTANEOUS | Status: DC
  Administered 2017-12-13 – 2017-12-15 (×3): 40 mg via SUBCUTANEOUS
  Filled 2017-12-12 (×3): qty 0.4

## 2017-12-12 MED ORDER — HYDROCODONE-ACETAMINOPHEN 5-325 MG PO TABS
1.0000 | ORAL_TABLET | ORAL | Status: DC | PRN
  Administered 2017-12-12: 1 via ORAL
  Administered 2017-12-13 – 2017-12-15 (×3): 2 via ORAL
  Filled 2017-12-12 (×3): qty 2
  Filled 2017-12-12: qty 1
  Filled 2017-12-12: qty 2

## 2017-12-12 MED ORDER — PHENOL 1.4 % MT LIQD: 1.0000 | OROMUCOSAL | Status: DC | PRN

## 2017-12-12 MED ORDER — SODIUM CHLORIDE 0.9 % IR SOLN
Status: DC | PRN
  Administered 2017-12-12: 3000 mL

## 2017-12-12 MED ORDER — FLUOXETINE HCL 20 MG PO CAPS
60.0000 mg | ORAL_CAPSULE | Freq: Every day | ORAL | Status: DC
  Administered 2017-12-13 – 2017-12-15 (×3): 60 mg via ORAL
  Filled 2017-12-12 (×6): qty 3

## 2017-12-12 MED ORDER — FENTANYL CITRATE (PF) 100 MCG/2ML IJ SOLN
INTRAMUSCULAR | Status: AC
  Administered 2017-12-12: 50 ug via INTRAVENOUS
  Filled 2017-12-12: qty 2

## 2017-12-12 MED ORDER — METOCLOPRAMIDE HCL 5 MG PO TABS: 5.0000 mg | ORAL_TABLET | Freq: Three times a day (TID) | ORAL | Status: DC | PRN

## 2017-12-12 MED ORDER — LIDOCAINE 2% (20 MG/ML) 5 ML SYRINGE
INTRAMUSCULAR | Status: DC | PRN
  Administered 2017-12-12: 40 mg via INTRAVENOUS

## 2017-12-12 MED ORDER — KETOROLAC TROMETHAMINE 30 MG/ML IJ SOLN
INTRAMUSCULAR | Status: AC
  Filled 2017-12-12: qty 1

## 2017-12-12 MED ORDER — BUPIVACAINE-EPINEPHRINE (PF) 0.5% -1:200000 IJ SOLN
INTRAMUSCULAR | Status: AC
  Filled 2017-12-12: qty 1.8

## 2017-12-12 MED ORDER — MORPHINE SULFATE (PF) 2 MG/ML IV SOLN: 0.5000 mg | INTRAVENOUS | Status: DC | PRN

## 2017-12-12 MED ORDER — OXYCODONE HCL 5 MG PO TABS: 5.0000 mg | ORAL_TABLET | Freq: Once | ORAL | Status: DC | PRN

## 2017-12-12 MED ORDER — ROCURONIUM BROMIDE 50 MG/5ML IV SOSY
PREFILLED_SYRINGE | INTRAVENOUS | Status: AC
  Filled 2017-12-12: qty 5

## 2017-12-12 SURGICAL SUPPLY — 61 items
ADH SKN CLS APL DERMABOND .7 (GAUZE/BANDAGES/DRESSINGS) ×1
ALCOHOL ISOPROPYL (RUBBING) (MISCELLANEOUS) ×2 IMPLANT
BLADE CLIPPER SURG (BLADE) IMPLANT
CHLORAPREP W/TINT 26ML (MISCELLANEOUS) ×2 IMPLANT
COVER SURGICAL LIGHT HANDLE (MISCELLANEOUS) ×2 IMPLANT
COVER WAND RF STERILE (DRAPES) ×2 IMPLANT
DERMABOND ADVANCED (GAUZE/BANDAGES/DRESSINGS) ×1
DERMABOND ADVANCED .7 DNX12 (GAUZE/BANDAGES/DRESSINGS) ×2 IMPLANT
DRAPE C-ARM 42X72 X-RAY (DRAPES) ×2 IMPLANT
DRAPE STERI IOBAN 125X83 (DRAPES) ×2 IMPLANT
DRAPE U-SHAPE 47X51 STRL (DRAPES) ×6 IMPLANT
DRESSING AQUACEL AG SP 3.5X6 (GAUZE/BANDAGES/DRESSINGS) IMPLANT
DRSG AQUACEL AG ADV 3.5X10 (GAUZE/BANDAGES/DRESSINGS) ×2 IMPLANT
DRSG AQUACEL AG SP 3.5X6 (GAUZE/BANDAGES/DRESSINGS) ×2
ELECT BLADE 4.0 EZ CLEAN MEGAD (MISCELLANEOUS) ×2
ELECT PENCIL ROCKER SW 15FT (MISCELLANEOUS) ×2 IMPLANT
ELECT REM PT RETURN 9FT ADLT (ELECTROSURGICAL) ×2
ELECTRODE BLDE 4.0 EZ CLN MEGD (MISCELLANEOUS) ×1 IMPLANT
ELECTRODE REM PT RTRN 9FT ADLT (ELECTROSURGICAL) ×1 IMPLANT
EVACUATOR 1/8 PVC DRAIN (DRAIN) IMPLANT
GLOVE BIO SURGEON STRL SZ8.5 (GLOVE) ×4 IMPLANT
GLOVE BIOGEL PI IND STRL 8.5 (GLOVE) ×1 IMPLANT
GLOVE BIOGEL PI INDICATOR 8.5 (GLOVE) ×1
GOWN STRL REUS W/ TWL LRG LVL3 (GOWN DISPOSABLE) ×2 IMPLANT
GOWN STRL REUS W/TWL 2XL LVL3 (GOWN DISPOSABLE) ×2 IMPLANT
GOWN STRL REUS W/TWL LRG LVL3 (GOWN DISPOSABLE) ×4
HANDPIECE INTERPULSE COAX TIP (DISPOSABLE) ×2
HEAD MOD TYPE I 36MM TAP PLUS (Head) ×1 IMPLANT
HOOD PEEL AWAY FACE SHEILD DIS (HOOD) ×4 IMPLANT
HOOD PEEL AWAY FLYTE STAYCOOL (MISCELLANEOUS) ×3 IMPLANT
KIT BASIN OR (CUSTOM PROCEDURE TRAY) ×2 IMPLANT
KIT TURNOVER KIT B (KITS) ×2 IMPLANT
LINER ACET G7 36 SZE (Liner) ×1 IMPLANT
MANIFOLD NEPTUNE II (INSTRUMENTS) ×2 IMPLANT
MARKER SKIN DUAL TIP RULER LAB (MISCELLANEOUS) ×4 IMPLANT
NDL SPNL 18GX3.5 QUINCKE PK (NEEDLE) ×1 IMPLANT
NEEDLE SPNL 18GX3.5 QUINCKE PK (NEEDLE) ×2 IMPLANT
NS IRRIG 1000ML POUR BTL (IV SOLUTION) ×2 IMPLANT
PACK TOTAL JOINT (CUSTOM PROCEDURE TRAY) ×2 IMPLANT
PACK UNIVERSAL I (CUSTOM PROCEDURE TRAY) ×2 IMPLANT
PAD ARMBOARD 7.5X6 YLW CONV (MISCELLANEOUS) ×4 IMPLANT
SAW OSC TIP CART 19.5X105X1.3 (SAW) ×3 IMPLANT
SEALER BIPOLAR AQUA 6.0 (INSTRUMENTS) IMPLANT
SET HNDPC FAN SPRY TIP SCT (DISPOSABLE) ×1 IMPLANT
SHELL ACETABULAR 3H G7 (Shell) ×1 IMPLANT
SOL PREP POV-IOD 4OZ 10% (MISCELLANEOUS) ×2 IMPLANT
STEM FEM CMTL 15X150 133D (Stem) ×1 IMPLANT
SUT ETHIBOND NAB CT1 #1 30IN (SUTURE) ×4 IMPLANT
SUT MNCRL AB 3-0 PS2 18 (SUTURE) ×2 IMPLANT
SUT MON AB 2-0 CT1 36 (SUTURE) ×2 IMPLANT
SUT VIC AB 1 CT1 27 (SUTURE) ×2
SUT VIC AB 1 CT1 27XBRD ANBCTR (SUTURE) ×1 IMPLANT
SUT VIC AB 2-0 CT1 27 (SUTURE) ×2
SUT VIC AB 2-0 CT1 TAPERPNT 27 (SUTURE) ×1 IMPLANT
SUT VLOC 180 0 24IN GS25 (SUTURE) ×3 IMPLANT
SYR 50ML LL SCALE MARK (SYRINGE) ×2 IMPLANT
TOWEL OR 17X24 6PK STRL BLUE (TOWEL DISPOSABLE) ×2 IMPLANT
TOWEL OR 17X26 10 PK STRL BLUE (TOWEL DISPOSABLE) ×2 IMPLANT
TRAY CATH 16FR W/PLASTIC CATH (SET/KITS/TRAYS/PACK) IMPLANT
TRAY FOLEY CATH SILVER 16FR (SET/KITS/TRAYS/PACK) IMPLANT
WATER STERILE IRR 1000ML POUR (IV SOLUTION) ×6 IMPLANT

## 2017-12-12 NOTE — Anesthesia Preprocedure Evaluation (Addendum)
Anesthesia Evaluation  Patient identified by MRN, date of birth, ID band Patient awake    Reviewed: Allergy & Precautions, NPO status , Patient's Chart, lab work & pertinent test results  History of Anesthesia Complications Negative for: history of anesthetic complications  Airway Mallampati: III  TM Distance: >3 FB Neck ROM: Full    Dental  (+) Teeth Intact   Pulmonary former smoker,    breath sounds clear to auscultation       Cardiovascular (-) hypertension(-) angina(-) Past MI and (-) CHF + Valvular Problems/Murmurs AI  Rhythm:Regular     Neuro/Psych  Headaches, neg Seizures PSYCHIATRIC DISORDERS Depression Bipolar Disorder    GI/Hepatic Neg liver ROS, GERD  Controlled,  Endo/Other  Hypothyroidism   Renal/GU negative Renal ROS     Musculoskeletal   Abdominal   Peds  Hematology negative hematology ROS (+)   Anesthesia Other Findings 2018 tte:  - Left ventricle: The cavity size was normal. Systolic function was   normal. The estimated ejection fraction was in the range of 60%   to 65%. Wall motion was normal; there were no regional wall   motion abnormalities. Doppler parameters are consistent with   abnormal left ventricular relaxation (grade 1 diastolic   dysfunction). - Aortic valve: There was mild regurgitation. - Mitral valve: There was mild regurgitation. - Left atrium: The atrium was mildly dilated.  Reproductive/Obstetrics                             Anesthesia Physical Anesthesia Plan  ASA: II  Anesthesia Plan: General   Post-op Pain Management:    Induction: Intravenous  PONV Risk Score and Plan: 3 and Ondansetron  Airway Management Planned: Oral ETT  Additional Equipment: None  Intra-op Plan:   Post-operative Plan: Extubation in OR  Informed Consent: I have reviewed the patients History and Physical, chart, labs and discussed the procedure including the  risks, benefits and alternatives for the proposed anesthesia with the patient or authorized representative who has indicated his/her understanding and acceptance.   Dental advisory given  Plan Discussed with: CRNA and Surgeon  Anesthesia Plan Comments:        Anesthesia Quick Evaluation

## 2017-12-12 NOTE — Plan of Care (Signed)

## 2017-12-12 NOTE — Anesthesia Procedure Notes (Signed)
Procedure Name: Intubation Date/Time: 12/12/2017 8:39 AM Performed by: Clearnce Sorrel, CRNA Pre-anesthesia Checklist: Patient identified, Emergency Drugs available, Suction available, Patient being monitored and Timeout performed Patient Re-evaluated:Patient Re-evaluated prior to induction Oxygen Delivery Method: Circle system utilized Preoxygenation: Pre-oxygenation with 100% oxygen Induction Type: IV induction Ventilation: Mask ventilation without difficulty Laryngoscope Size: Mac and 3 Grade View: Grade I Tube type: Oral Tube size: 7.0 mm Number of attempts: 1 Airway Equipment and Method: Stylet Placement Confirmation: ETT inserted through vocal cords under direct vision,  positive ETCO2 and breath sounds checked- equal and bilateral Secured at: 23 cm Tube secured with: Tape Dental Injury: Teeth and Oropharynx as per pre-operative assessment

## 2017-12-12 NOTE — Interval H&P Note (Signed)
History and Physical Interval Note:  12/12/2017 8:28 AM  Shari Horn  has presented today for surgery, with the diagnosis of hip fracture  The various methods of treatment have been discussed with the patient and family. After consideration of risks, benefits and other options for treatment, the patient has consented to  Procedure(s): TOTAL HIP ARTHROPLASTY ANTERIOR APPROACH (Left) as a surgical intervention .  The patient's history has been reviewed, patient examined, no change in status, stable for surgery.  I have reviewed the patient's chart and labs.  Questions were answered to the patient's satisfaction.    The risks, benefits, and alternatives were discussed with the patient. There are risks associated with the surgery including, but not limited to, problems with anesthesia (death), infection, instability (giving out of the joint), dislocation, differences in leg length/angulation/rotation, fracture of bones, loosening or failure of implants, hematoma (blood accumulation) which may require surgical drainage, blood clots, pulmonary embolism, nerve injury (foot drop and lateral thigh numbness), and blood vessel injury. The patient understands these risks and elects to proceed.    Hilton Cork Rielly Corlett

## 2017-12-12 NOTE — Discharge Instructions (Signed)
°Dr. Clarence Dunsmore °Joint Replacement Specialist °[facility] °[address] °[place], [place] [address] °[phone] ° ° °TOTAL HIP REPLACEMENT POSTOPERATIVE DIRECTIONS ° ° ° °Hip Rehabilitation, Guidelines Following Surgery  ° °WEIGHT BEARING °Weight bearing as tolerated with assist device (walker, cane, etc) as directed, use it as long as suggested by your surgeon or therapist, typically at least 4-6 weeks. ° °The results of a hip operation are greatly improved after range of motion and muscle strengthening exercises. Follow all safety measures which are given to protect your hip. If any of these exercises cause increased pain or swelling in your joint, decrease the amount until you are comfortable again. Then slowly increase the exercises. Call your caregiver if you have problems or questions.  ° °HOME CARE INSTRUCTIONS  °Most of the following instructions are designed to prevent the dislocation of your new hip.  °Remove items at home which could result in a fall. This includes throw rugs or furniture in walking pathways.  °Continue medications as instructed at time of discharge. °· You may have some home medications which will be placed on hold until you complete the course of blood thinner medication. °· You may start showering once you are discharged home. Do not remove your dressing. °Do not put on socks or shoes without following the instructions of your caregivers.   °Sit on chairs with arms. Use the chair arms to help push yourself up when arising.  °Arrange for the use of a toilet seat elevator so you are not sitting low.  °· Walk with walker as instructed.  °You may resume a sexual relationship in one month or when given the OK by your caregiver.  °Use walker as long as suggested by your caregivers.  °You may put full weight on your legs and walk as much as is comfortable. °Avoid periods of inactivity such as sitting longer than an hour when not asleep. This helps prevent  blood clots.  °You may return to work once you are cleared by your surgeon.  °Do not drive a car for 6 weeks or until released by your surgeon.  °Do not drive while taking narcotics.  °Wear elastic stockings for two weeks following surgery during the day but you may remove then at night.  °Make sure you keep all of your appointments after your operation with all of your doctors and caregivers. You should call the office at the above phone number and make an appointment for approximately two weeks after the date of your surgery. °Please pick up a stool softener and laxative for home use as long as you are requiring pain medications. °· ICE to the affected hip every three hours for 30 minutes at a time and then as needed for pain and swelling. Continue to use ice on the hip for pain and swelling from surgery. You may notice swelling that will progress down to the foot and ankle.  This is normal after surgery.  Elevate the leg when you are not up walking on it.   °It is important for you to complete the blood thinner medication as prescribed by your doctor. °· Continue to use the breathing machine which will help keep your temperature down.  It is common for your temperature to cycle up and down following surgery, especially at night when you are not up moving around and exerting yourself.  The breathing machine keeps your lungs expanded and your temperature down. ° °RANGE OF MOTION AND STRENGTHENING EXERCISES  °These exercises are   designed to help you keep full movement of your hip joint. Follow your caregiver's or physical therapist's instructions. Perform all exercises about fifteen times, three times per day or as directed. Exercise both hips, even if you have had only one joint replacement. These exercises can be done on a training (exercise) mat, on the floor, on a table or on a bed. Use whatever works the best and is most comfortable for you. Use music or television while you are exercising so that the exercises  are a pleasant break in your day. This will make your life better with the exercises acting as a break in routine you can look forward to.  °Lying on your back, slowly slide your foot toward your buttocks, raising your knee up off the floor. Then slowly slide your foot back down until your leg is straight again.  °Lying on your back spread your legs as far apart as you can without causing discomfort.  °Lying on your side, raise your upper leg and foot straight up from the floor as far as is comfortable. Slowly lower the leg and repeat.  °Lying on your back, tighten up the muscle in the front of your thigh (quadriceps muscles). You can do this by keeping your leg straight and trying to raise your heel off the floor. This helps strengthen the largest muscle supporting your knee.  °Lying on your back, tighten up the muscles of your buttocks both with the legs straight and with the knee bent at a comfortable angle while keeping your heel on the floor.  ° °SKILLED REHAB INSTRUCTIONS: °If the patient is transferred to a skilled rehab facility following release from the hospital, a list of the current medications will be sent to the facility for the patient to continue.  When discharged from the skilled rehab facility, please have the facility set up the patient's Home Health Physical Therapy prior to being released. Also, the skilled facility will be responsible for providing the patient with their medications at time of release from the facility to include their pain medication and their blood thinner medication. If the patient is still at the rehab facility at time of the two week follow up appointment, the skilled rehab facility will also need to assist the patient in arranging follow up appointment in our office and any transportation needs. ° °MAKE SURE YOU:  °Understand these instructions.  °Will watch your condition.  °Will get help right away if you are not doing well or get worse. ° °Pick up stool softner and  laxative for home use following surgery while on pain medications. °Do not remove your dressing. °The dressing is waterproof--it is OK to take showers. °Continue to use ice for pain and swelling after surgery. °Do not use any lotions or creams on the incision until instructed by your surgeon. °Total Hip Protocol. ° ° °

## 2017-12-12 NOTE — Op Note (Signed)
OPERATIVE REPORT  SURGEON: Rod Can, MD   ASSISTANT: Dierdre Highman, PA-C.  PREOPERATIVE DIAGNOSIS: Displaced Left femoral neck fracture.   POSTOPERATIVE DIAGNOSIS: Displaced Left femoral neck fracture.   PROCEDURE: Left total hip arthroplasty, anterior approach.   IMPLANTS: Biomet Taperloc Complete Reduced Distal stem, size 15 x 150, high offset. Biomet G7 Cup, size 52 mm. Biomet E1 liner, size 36 mm, E, neutral. Biomet metal head ball, size 36 + 6 mm.  ANESTHESIA:  General  ESTIMATED BLOOD LOSS:-300 mL    ANTIBIOTICS: 2 g Ancef.  DRAINS: None.  COMPLICATIONS: None.   CONDITION: PACU - hemodynamically stable.   BRIEF CLINICAL NOTE: Shari Horn is a 77 y.o. female with a displaced left femoral neck fracture. After admission by the hospitalist for perioperative risk stratification and medical optimization, the patient was indicated for total hip arthroplasty. The risks, benefits, and alternatives to the procedure were explained, and the patient elected to proceed.  PROCEDURE IN DETAIL: Surgical site was marked by myself in the pre-op holding area. Once inside the operating room, spinal anesthesia was obtained, and a foley catheter was inserted. The patient was then positioned on the Hana table. All bony prominences were well padded. The hip was prepped and draped in the normal sterile surgical fashion. A time-out was called verifying side and site of surgery. The patient received IV antibiotics within 60 minutes of beginning the procedure.  The direct anterior approach to the hip was performed through the Hueter interval. Lateral femoral circumflex vessels were treated with the Auqumantys. The anterior capsule was exposed and an inverted T capsulotomy was made.The fracture hematoma was encountered and evacuated. The comminuted subcapital neck fracture was identified. The femoral neck cut was made to the level of the templated cut. A corkscrew was placed into the  head and the head was removed.The head was passed to the back table and was measured.  Acetabular exposure was achieved, and the pulvinar and labrum were excised. Sequential reaming of the acetabulum was then performed up to a size 51 mm reamer. A 52 mm cup was then opened and impacted into place at approximately 40 degrees of abduction and 20 degrees of anteversion. The final polyethylene liner was impacted into place and acetabular osteophytes were removed.   I then gained femoral exposure taking care to protect the abductors and greater trochanter. This was performed using standard external rotation, extension, and adduction. The capsule was peeled off the inner aspect of the greater trochanter, taking care to preserve the short external rotators. A cookie cutter was used to enter the femoral canal, and then the femoral canal finder was placed. Sequential broaching was performed up to a size 15. Calcar planer was used on the femoral neck remnant. I placed a high offset neck and a trial head ball. The hip was reduced. Leg lengths and offset were checked fluoroscopically. The hip was dislocated and trial components were removed. The final implants were placed, and the hip was reduced.  Fluoroscopy was used to confirm component position and leg lengths. At 90 degrees of external rotation and full extension, the hip was stable to an anterior directed force.  The wound was copiously irrigated with normal saline using pulse lavage. Marcaine solution was injected into the periarticular soft tissue. The wound was closed in layers using #1 Vicryl and V-Loc for the fascia, 2-0 Vicryl for the subcutaneous fat, 2-0 Monocryl for the deep dermal layer, 3-0 running Monocryl subcuticular stitch, and Dermabond for the skin. Once the glue  was fully dried, an Aquacell Ag dressing was applied. The patient was transported to the recovery room in stable condition. Sponge, needle, and instrument counts were  correct at the end of the case x2. The patient tolerated the procedure well and there were no known complications.  Please note that a surgical assistant was a medical necessity for this procedure to perform it in a safe and expeditious manner. Assistant was necessary to provide appropriate retraction of vital neurovascular structures, to prevent femoral fracture, and to allow for anatomic placement of the prosthesis.  Postoperatively, the patient will be readmitted to the hospitalist. Weight bearing as tolerated with a walker. Lovenox in house, home on ASA 81 mg PO BID for DVT prophylaxis. Plan for discharge home with HHPT. Return to the office in 2 weeks for routine care.

## 2017-12-12 NOTE — Transfer of Care (Signed)
Immediate Anesthesia Transfer of Care Note  Patient: Shari Horn  Procedure(s) Performed: TOTAL HIP ARTHROPLASTY ANTERIOR APPROACH (Left Hip)  Patient Location: PACU  Anesthesia Type:General  Level of Consciousness: awake, alert  and oriented  Airway & Oxygen Therapy: Patient Spontanous Breathing and Patient connected to nasal cannula oxygen  Post-op Assessment: Report given to RN and Post -op Vital signs reviewed and stable  Post vital signs: Reviewed and stable  Last Vitals:  Vitals Value Taken Time  BP 141/43 12/12/2017 11:12 AM  Temp    Pulse 80 12/12/2017 11:13 AM  Resp 15 12/12/2017 11:13 AM  SpO2 97 % 12/12/2017 11:13 AM  Vitals shown include unvalidated device data.  Last Pain:  Vitals:   12/12/17 0445  TempSrc: Oral  PainSc:       Patients Stated Pain Goal: 1 (33/58/25 1898)  Complications: No apparent anesthesia complications

## 2017-12-12 NOTE — Progress Notes (Signed)
PROGRESS NOTE    Shari Horn  QIW:[id number] DOB: [birth year] DOA: 12/11/2017 PCP: Vivi Barrack, MD    Brief Narrative:  Shari Horn is a 77 y.o. female with medical history significant of hypothyroidism, hld, bipolar  Was on her way to the movies, stumbled and fell down on the left side. She was found to have left hip fracture.   Assessment & Plan:   Principal Problem:   Hip fracture ([place]) Active Problems:   Dyslipidemia   Bipolar 2 disorder (HCC)   Hypothyroidism   Osteopenia   Closed displaced fracture of left femoral neck (HCC)   Closed displaced fracture of the left femoral neck: S/p arthroplasty by Dr Lyla Glassing.  Pain control and PT eval in am.     Hypothyroidism: Resume synthroid.    Hyperlipidemia: Resume statin.    Anemia of acute blood loss from surgery.  - hemoglobin around 11. Monitor.   MILD AKI; Hydration and repeat renal parameters show improvement.    DVT prophylaxis: lovenox.  Code Status:  Full code.  Family Communication: family. at bedside.  Disposition Plan: pending PT evaluation.    Consultants:   Orthopedics Dr Lyla Glassing   Procedures:  Left total hip arthroplasty.    Antimicrobials: none.    Subjective: Reports she is able to bear weight, she denies any chest pain or sob.   Objective: Vitals:   12/12/17 1215 12/12/17 1230 12/12/17 1245 12/12/17 1309  BP: (!) 122/52 129/60 119/61 (!) 129/53  Pulse: 82 82 80 78  Resp: 19 12 19    Temp:   (!) 97.3 F (36.3 C) (!) 97.2 F (36.2 C)  TempSrc:      SpO2: 96% 97% 96% 98%  Weight:      Height:        Intake/Output Summary (Last 24 hours) at 12/12/2017 1534 Last data filed at 12/12/2017 1412 Gross per 24 hour  Intake 1050 ml  Output 700 ml  Net 350 ml   Filed Weights   12/11/17 1520  Weight: 76.2 kg    Examination:  General exam: Appears calm and comfortable  Respiratory system: Clear to auscultation. Respiratory effort normal. Cardiovascular system: S1 & S2  heard, RRR. No JVD,. No pedal edema. Gastrointestinal system: Abdomen is nondistended, soft and nontender. Normal bowel sounds heard. Central nervous system: Alert and oriented. No focal neurological deficits. Extremities: painful ROM of the left lower extremity.  Skin: No rashes, lesions or ulcers Psychiatry:  Mood & affect appropriate.     Data Reviewed: I have personally reviewed following labs and imaging studies  CBC: Recent Labs  Lab 12/11/17 1633 12/12/17 0507 12/12/17 1328  WBC 10.0 8.8 11.3*  NEUTROABS 6.8  --   --   HGB 13.4 12.5 11.5*  HCT 40.7 37.6 36.6  MCV 94.4 92.4 95.6  PLT 218 205 417   Basic Metabolic Panel: Recent Labs  Lab 12/11/17 1633 12/12/17 0507 12/12/17 1328  NA 136 131*  --   K 4.2 3.9  --   CL 104 101  --   CO2 25 21*  --   GLUCOSE 105* 95  --   BUN 28* 17  --   CREATININE 1.02* 0.98 0.81  CALCIUM 9.5 9.0  --    GFR: Estimated Creatinine Clearance: 60.9 mL/min (by C-G formula based on SCr of 0.81 mg/dL). Liver Function Tests: No results for input(s): AST, ALT, ALKPHOS, BILITOT, PROT, ALBUMIN in the last 168 hours. No results for input(s): LIPASE, AMYLASE in the last  168 hours. No results for input(s): AMMONIA in the last 168 hours. Coagulation Profile: Recent Labs  Lab 12/11/17 1633  INR 0.99   Cardiac Enzymes: No results for input(s): CKTOTAL, CKMB, CKMBINDEX, TROPONINI in the last 168 hours. BNP (last 3 results) No results for input(s): PROBNP in the last 8760 hours. HbA1C: No results for input(s): HGBA1C in the last 72 hours. CBG: No results for input(s): GLUCAP in the last 168 hours. Lipid Profile: No results for input(s): CHOL, HDL, LDLCALC, TRIG, CHOLHDL, LDLDIRECT in the last 72 hours. Thyroid Function Tests: No results for input(s): TSH, T4TOTAL, FREET4, T3FREE, THYROIDAB in the last 72 hours. Anemia Panel: No results for input(s): VITAMINB12, FOLATE, FERRITIN, TIBC, IRON, RETICCTPCT in the last 72 hours. Sepsis  Labs: No results for input(s): PROCALCITON, LATICACIDVEN in the last 168 hours.  Recent Results (from the past 240 hour(s))  Surgical PCR screen     Status: None   Collection Time: 12/12/17  1:40 AM  Result Value Ref Range Status   MRSA, PCR NEGATIVE NEGATIVE Final   Staphylococcus aureus NEGATIVE NEGATIVE Final    Comment: (NOTE) The Xpert SA Assay (FDA approved for NASAL specimens in patients 35 years of age and older), is one component of a comprehensive surveillance program. It is not intended to diagnose infection nor to guide or monitor treatment. Performed at [facility], [place] [address], [place], [place] [address]          Radiology Studies: Pelvis Portable  Result Date: 12/12/2017 CLINICAL DATA:  Status post LEFT hip arthroplasty EXAM: PORTABLE PELVIS 1-2 VIEWS COMPARISON:  12/12/2017 FINDINGS: LEFT hip arthroplasty changes noted without complicating features. No acute fracture or dislocation. Degenerative changes in the LOWER lumbar spine noted. IMPRESSION: LEFT hip arthroplasty without complicating features. Electronically Signed   By: Margarette Canada M.D.   On: 12/12/2017 14:12   Dg Chest Port 1 View  Result Date: 12/11/2017 CLINICAL DATA:  77 year old who fell onto her LEFT side while at a theater earlier today and sustained a LEFT femoral neck fracture. Preoperative respiratory evaluation. EXAM: PORTABLE CHEST 1 VIEW COMPARISON:  01/16/2017, 01/10/2014, 08/22/2007. FINDINGS: Cardiac silhouette normal in size, unchanged. Thoracic aorta mildly atherosclerotic, unchanged. Hilar and mediastinal contours otherwise unremarkable. Stable mild chronic elevation of the RIGHT hemidiaphragm. Lungs clear. Bronchovascular markings normal. Pulmonary vascularity normal. No visible pleural effusions. No pneumothorax. IMPRESSION: 1.  No acute cardiopulmonary disease. 2.  Aortic Atherosclerosis (ICD10-170.0) Electronically Signed   By: Evangeline Dakin M.D.   On: 12/11/2017 17:54   Dg  Knee Complete 4 Views Left  Result Date: 12/12/2017 CLINICAL DATA:  Fall, status post hip fracture. EXAM: LEFT KNEE - COMPLETE 4+ VIEW COMPARISON:  None. FINDINGS: Four views of the LEFT knee are provided. Osseous alignment is normal. No fracture line or displaced fracture fragment seen. Faint calcifications within the joint space is compatible with chondrocalcinosis suggesting underlying CPPD. No large osteophytes or other signs of advanced DJD. No appreciable joint effusion and adjacent soft tissues are unremarkable. IMPRESSION: No acute findings. Electronically Signed   By: Franki Cabot M.D.   On: 12/12/2017 00:01   Dg C-arm 1-60 Min  Result Date: 12/12/2017 CLINICAL DATA:  Total hip arthroplasty LEFT EXAM: DG C-ARM 61-120 MIN; OPERATIVE RIGHT HIP WITH PELVIS COMPARISON:  12/11/2017 FLUOROSCOPY TIME:  0 minutes 18 seconds Images obtained: 3 FINDINGS: Images demonstrate placement of a LEFT hip prosthesis. Osseous mineralization grossly normal for technique. No fracture or dislocation identified. IMPRESSION: Post LEFT hip arthroplasty without  acute abnormality. Electronically Signed   By: Lavonia Dana M.D.   On: 12/12/2017 10:54   Dg Hip Operative Unilat W Or W/o Pelvis Right  Result Date: 12/12/2017 CLINICAL DATA:  Total hip arthroplasty LEFT EXAM: DG C-ARM 61-120 MIN; OPERATIVE RIGHT HIP WITH PELVIS COMPARISON:  12/11/2017 FLUOROSCOPY TIME:  0 minutes 18 seconds Images obtained: 3 FINDINGS: Images demonstrate placement of a LEFT hip prosthesis. Osseous mineralization grossly normal for technique. No fracture or dislocation identified. IMPRESSION: Post LEFT hip arthroplasty without acute abnormality. Electronically Signed   By: Lavonia Dana M.D.   On: 12/12/2017 10:54   Dg Hip Unilat With Pelvis 2-3 Views Left  Result Date: 12/11/2017 CLINICAL DATA:  Left hip pain after falling today. EXAM: DG HIP (WITH OR WITHOUT PELVIS) 2-3V LEFT COMPARISON:  Pelvic and right hip radiographs 03/19/2017.  FINDINGS: The bones appear mildly demineralized. There is an acute fracture of the mid left femoral neck with resulting moderate varus angulation. The left femoral head remains located. There is no evidence of pelvic fracture. Mild degenerative changes are present at both hips and sacroiliac joints. There are moderate degenerative changes in the visualized lower lumbar spine. Multiple pelvic calcifications are similar to previous study, likely phleboliths. IMPRESSION: Acute, moderately angulated fracture of the mid left femoral neck. Electronically Signed   By: Richardean Sale M.D.   On: 12/11/2017 17:54        Scheduled Meds: . docusate sodium  100 mg Oral BID  . [START ON 12/13/2017] enoxaparin (LOVENOX) injection  40 mg Subcutaneous Q24H  . ketorolac  7.5 mg Intravenous Q6H  . senna  1 tablet Oral BID   Continuous Infusions: .  ceFAZolin (ANCEF) IV 2 g (12/12/17 1532)  . methocarbamol (ROBAXIN) IV       LOS: 1 day    Time spent: 35 minutes.     Hosie Poisson, MD [facility] Pager [phone]  If 7PM-7AM, please contact night-coverage [email or web address] Password [facility] 12/12/2017, 3:34 PM

## 2017-12-12 NOTE — Anesthesia Postprocedure Evaluation (Signed)
Anesthesia Post Note  Patient: Shari Horn  Procedure(s) Performed: TOTAL HIP ARTHROPLASTY ANTERIOR APPROACH (Left Hip)     Patient location during evaluation: PACU Anesthesia Type: General Level of consciousness: awake and alert Pain management: pain level controlled Vital Signs Assessment: post-procedure vital signs reviewed and stable Respiratory status: spontaneous breathing, nonlabored ventilation, respiratory function stable and patient connected to nasal cannula oxygen Cardiovascular status: blood pressure returned to baseline and stable Postop Assessment: no apparent nausea or vomiting Anesthetic complications: no    Last Vitals:  Vitals:   12/12/17 1309 12/12/17 1623  BP: (!) 129/53 126/65  Pulse: 78 75  Resp:  18  Temp: (!) 36.2 C 36.9 C  SpO2: 98% 97%    Last Pain:  Vitals:   12/12/17 1623  TempSrc: Oral  PainSc:                  [name]

## 2017-12-13 DIAGNOSIS — M8580 Other specified disorders of bone density and structure, unspecified site: Secondary | ICD-10-CM

## 2017-12-13 LAB — BASIC METABOLIC PANEL
Anion gap: 3 — ABNORMAL LOW (ref 5–15)
BUN: 13 mg/dL (ref 8–23)
CO2: 29 mmol/L (ref 22–32)
CREATININE: 0.87 mg/dL (ref 0.44–1.00)
Calcium: 8.7 mg/dL — ABNORMAL LOW (ref 8.9–10.3)
Chloride: 105 mmol/L (ref 98–111)
GFR calc Af Amer: >60 mL/min (ref >60)
GLUCOSE: 133 mg/dL — AB (ref 70–99)
Potassium: 4.4 mmol/L (ref 3.5–5.1)
SODIUM: 137 mmol/L (ref 135–145)

## 2017-12-13 LAB — CBC
HCT: 30.9 % — ABNORMAL LOW (ref 36.0–46.0)
Hemoglobin: 10 g/dL — ABNORMAL LOW (ref 12.0–15.0)
MCH: 30.6 pg (ref 26.0–34.0)
MCHC: 32.4 g/dL (ref 30.0–36.0)
MCV: 94.5 fL (ref 80.0–100.0)
PLATELETS: 177 10*3/uL (ref 150–400)
RBC: 3.27 MIL/uL — ABNORMAL LOW (ref 3.87–5.11)
RDW: 12.4 % (ref 11.5–15.5)
WBC: 11.3 10*3/uL — ABNORMAL HIGH (ref 4.0–10.5)
nRBC: 0 % (ref 0.0–0.2)

## 2017-12-13 MED ORDER — CALCIUM CARBONATE ANTACID 500 MG PO CHEW
1.0000 | CHEWABLE_TABLET | Freq: Two times a day (BID) | ORAL | Status: DC
  Administered 2017-12-13 – 2017-12-15 (×4): 200 mg via ORAL
  Filled 2017-12-13 (×4): qty 1

## 2017-12-13 MED ORDER — VITAMIN D 25 MCG (1000 UNIT) PO TABS
1000.0000 [IU] | ORAL_TABLET | Freq: Every day | ORAL | Status: DC
  Administered 2017-12-13 – 2017-12-15 (×3): 1000 [IU] via ORAL
  Filled 2017-12-13 (×3): qty 1

## 2017-12-13 NOTE — Progress Notes (Signed)
CSW acknowledges SNF consult. Per PT note patient will go home with home health.   CSW signing off.   [place], [place]

## 2017-12-13 NOTE — Progress Notes (Signed)
    Subjective:  Patient reports pain as mild to moderate.  Denies N/V/CP/SOB. No c/o.  Objective:   VITALS:   Vitals:   12/12/17 1623 12/12/17 1941 12/13/17 0006 12/13/17 0551  BP: 126/65 (!) 127/57 (!) 121/55 (!) 106/50  Pulse: 75 77 71 74  Resp: 18     Temp: 98.5 F (36.9 C) 98.4 F (36.9 C) 97.9 F (36.6 C) 98.1 F (36.7 C)  TempSrc: Oral Oral Oral Oral  SpO2: 97% 100% 100% 94%  Weight:      Height:        NAD ABD soft Sensation intact distally Intact pulses distally Dorsiflexion/Plantar flexion intact Incision: dressing C/D/I Compartment soft   Lab Results  Component Value Date   WBC 11.3 (H) 12/13/2017   HGB 10.0 (L) 12/13/2017   HCT 30.9 (L) 12/13/2017   MCV 94.5 12/13/2017   PLT 177 12/13/2017   BMET    Component Value Date/Time   NA 137 12/13/2017 0721   NA 141 08/20/2016 1044   K 4.4 12/13/2017 0721   CL 105 12/13/2017 0721   CO2 29 12/13/2017 0721   GLUCOSE 133 (H) 12/13/2017 0721   BUN 13 12/13/2017 0721   BUN 14 08/20/2016 1044   CREATININE 0.87 12/13/2017 0721   CREATININE 0.84 05/27/2016 0937   CALCIUM 8.7 (L) 12/13/2017 0721   GFRNONAA >60 12/13/2017 0721   GFRNONAA 68 05/27/2016 0937   GFRAA >60 12/13/2017 0721   GFRAA 78 05/27/2016 0937     Assessment/Plan: 1 Day Post-Op   Principal Problem:   Hip fracture (HCC) Active Problems:   Dyslipidemia   Bipolar 2 disorder ([place])   Hypothyroidism   Osteopenia   Closed displaced fracture of left femoral neck ([place])   WBAT with walker DVT ppx: lovenox in house --> home on ASA, SCDs, TEDS PO pain control PT/OT Dispo: D/C home with HHPT   Hilton Cork Nicol Herbig 12/13/2017, 12:46 PM   Rod Can, MD Cell: [phone] [facility]  [facility] [address], [place], [place] [address] Phone: [phone] [email or web address] Facebook  [employer]

## 2017-12-13 NOTE — Progress Notes (Signed)
PROGRESS NOTE    Shari Horn  DHR:[id number] DOB: 01/26/1940 DOA: 12/11/2017 PCP: Vivi Barrack, MD    Brief Narrative:  Shari Horn is a 77 y.o. female with medical history significant of hypothyroidism, hld, bipolar  Was on her way to the movies, stumbled and fell down on the left side. She was found to have left hip fracture and underwent left hip arthroplasty on 12/12/2017.  Assessment & Plan:   Principal Problem:   Hip fracture ([place]) Active Problems:   Dyslipidemia   Bipolar 2 disorder (HCC)   Hypothyroidism   Osteopenia   Closed displaced fracture of left femoral neck (HCC)   Osteopenia/closed displaced fracture of the left femoral neck: s/p arthroplasty on 12/12/2017.  Surgical site looks clean. -Continue pain management.  Currently well controlled -Vitamin D and calcium supplementation -PT eval  Hypothyroidism: Stable -Continue home Synthroid  Hyperlipidemia: Stable -Continue home Crestor and Zetia  Anemia of acute blood loss from surgery: Mild hemoglobin down trended from 12.5 on admission to 10.0 this morning -Continue monitoring  MILD AKI: Resolved -Continue trending  Bipolar disorder: Stable.-Continue home meds  DVT prophylaxis: lovenox.  Code Status:  Full code.  Family Communication: Husband at bedside. Disposition Plan: pending PT evaluation.   Consultants:   Orthopedics Dr Lyla Glassing   Procedures:  Left total hip arthroplasty.    Antimicrobials: none.    Subjective: No complaints this morning.  Pain well controlled.  Passing gases.  Has not had bowel movement.  Denies chest pain or shortness of breath.  Objective: Vitals:   12/12/17 1623 12/12/17 1941 12/13/17 0006 12/13/17 0551  BP: 126/65 (!) 127/57 (!) 121/55 (!) 106/50  Pulse: 75 77 71 74  Resp: 18     Temp: 98.5 F (36.9 C) 98.4 F (36.9 C) 97.9 F (36.6 C) 98.1 F (36.7 C)  TempSrc: Oral Oral Oral Oral  SpO2: 97% 100% 100% 94%  Weight:      Height:         Intake/Output Summary (Last 24 hours) at 12/13/2017 1129 Last data filed at 12/13/2017 0555 Gross per 24 hour  Intake 630 ml  Output 1300 ml  Net -670 ml   Filed Weights   12/11/17 1520  Weight: 76.2 kg    Examination: GENERAL: appears well, no ditress HEENT: PERRLA, MMM NECK: Supple LUNGS:  No IWOB, good air movement, CTAB HEART:  RRR with no M/R/G ABD:  Morbidly obese, soft, NT with active BS EXT: Surgical dressing in place.  Wound appears clean.  No apparent hematoma or swelling NEURO:   Alert, awake and oriented appropriately.  No gross neuro deficit PSYCH: normal affect  Data Reviewed: I have personally reviewed following labs and imaging studies  CBC: Recent Labs  Lab 12/11/17 1633 12/12/17 0507 12/12/17 1328 12/13/17 0721  WBC 10.0 8.8 11.3* 11.3*  NEUTROABS 6.8  --   --   --   HGB 13.4 12.5 11.5* 10.0*  HCT 40.7 37.6 36.6 30.9*  MCV 94.4 92.4 95.6 94.5  PLT 218 205 201 468   Basic Metabolic Panel: Recent Labs  Lab 12/11/17 1633 12/12/17 0507 12/12/17 1328 12/13/17 0721  NA 136 131*  --  137  K 4.2 3.9  --  4.4  CL 104 101  --  105  CO2 25 21*  --  29  GLUCOSE 105* 95  --  133*  BUN 28* 17  --  13  CREATININE 1.02* 0.98 0.81 0.87  CALCIUM 9.5 9.0  --  8.7*   GFR: Estimated Creatinine Clearance: 56.7 mL/min (by C-G formula based on SCr of 0.87 mg/dL). Liver Function Tests: No results for input(s): AST, ALT, ALKPHOS, BILITOT, PROT, ALBUMIN in the last 168 hours. No results for input(s): LIPASE, AMYLASE in the last 168 hours. No results for input(s): AMMONIA in the last 168 hours. Coagulation Profile: Recent Labs  Lab 12/11/17 1633  INR 0.99   Cardiac Enzymes: No results for input(s): CKTOTAL, CKMB, CKMBINDEX, TROPONINI in the last 168 hours. BNP (last 3 results) No results for input(s): PROBNP in the last 8760 hours. HbA1C: No results for input(s): HGBA1C in the last 72 hours. CBG: No results for input(s): GLUCAP in the last 168  hours. Lipid Profile: No results for input(s): CHOL, HDL, LDLCALC, TRIG, CHOLHDL, LDLDIRECT in the last 72 hours. Thyroid Function Tests: No results for input(s): TSH, T4TOTAL, FREET4, T3FREE, THYROIDAB in the last 72 hours. Anemia Panel: No results for input(s): VITAMINB12, FOLATE, FERRITIN, TIBC, IRON, RETICCTPCT in the last 72 hours. Sepsis Labs: No results for input(s): PROCALCITON, LATICACIDVEN in the last 168 hours.  Recent Results (from the past 240 hour(s))  Surgical PCR screen     Status: None   Collection Time: 12/12/17  1:40 AM  Result Value Ref Range Status   MRSA, PCR NEGATIVE NEGATIVE Final   Staphylococcus aureus NEGATIVE NEGATIVE Final    Comment: (NOTE) The Xpert SA Assay (FDA approved for NASAL specimens in patients 7 years of age and older), is one component of a comprehensive surveillance program. It is not intended to diagnose infection nor to guide or monitor treatment. Performed at [facility], [place] [address] [place], [place] [address]          Radiology Studies: Pelvis Portable  Result Date: 12/12/2017 CLINICAL DATA:  Status post LEFT hip arthroplasty EXAM: PORTABLE PELVIS 1-2 VIEWS COMPARISON:  12/12/2017 FINDINGS: LEFT hip arthroplasty changes noted without complicating features. No acute fracture or dislocation. Degenerative changes in the LOWER lumbar spine noted. IMPRESSION: LEFT hip arthroplasty without complicating features. Electronically Signed   By: Margarette Canada M.D.   On: 12/12/2017 14:12   Dg Chest Port 1 View  Result Date: 12/11/2017 CLINICAL DATA:  77 year old who fell onto her LEFT side while at a theater earlier today and sustained a LEFT femoral neck fracture. Preoperative respiratory evaluation. EXAM: PORTABLE CHEST 1 VIEW COMPARISON:  01/16/2017, 01/10/2014, 08/22/2007. FINDINGS: Cardiac silhouette normal in size, unchanged. Thoracic aorta mildly atherosclerotic, unchanged. Hilar and mediastinal contours otherwise unremarkable.  Stable mild chronic elevation of the RIGHT hemidiaphragm. Lungs clear. Bronchovascular markings normal. Pulmonary vascularity normal. No visible pleural effusions. No pneumothorax. IMPRESSION: 1.  No acute cardiopulmonary disease. 2.  Aortic Atherosclerosis (ICD10-170.0) Electronically Signed   By: Evangeline Dakin M.D.   On: 12/11/2017 17:54   Dg Knee Complete 4 Views Left  Result Date: 12/12/2017 CLINICAL DATA:  Fall, status post hip fracture. EXAM: LEFT KNEE - COMPLETE 4+ VIEW COMPARISON:  None. FINDINGS: Four views of the LEFT knee are provided. Osseous alignment is normal. No fracture line or displaced fracture fragment seen. Faint calcifications within the joint space is compatible with chondrocalcinosis suggesting underlying CPPD. No large osteophytes or other signs of advanced DJD. No appreciable joint effusion and adjacent soft tissues are unremarkable. IMPRESSION: No acute findings. Electronically Signed   By: Franki Cabot M.D.   On: 12/12/2017 00:01   Dg C-arm 1-60 Min  Result Date: 12/12/2017 CLINICAL DATA:  Total hip arthroplasty LEFT EXAM: DG C-ARM 61-120  MIN; OPERATIVE RIGHT HIP WITH PELVIS COMPARISON:  12/11/2017 FLUOROSCOPY TIME:  0 minutes 18 seconds Images obtained: 3 FINDINGS: Images demonstrate placement of a LEFT hip prosthesis. Osseous mineralization grossly normal for technique. No fracture or dislocation identified. IMPRESSION: Post LEFT hip arthroplasty without acute abnormality. Electronically Signed   By: Lavonia Dana M.D.   On: 12/12/2017 10:54   Dg Hip Operative Unilat W Or W/o Pelvis Right  Result Date: 12/12/2017 CLINICAL DATA:  Total hip arthroplasty LEFT EXAM: DG C-ARM 61-120 MIN; OPERATIVE RIGHT HIP WITH PELVIS COMPARISON:  12/11/2017 FLUOROSCOPY TIME:  0 minutes 18 seconds Images obtained: 3 FINDINGS: Images demonstrate placement of a LEFT hip prosthesis. Osseous mineralization grossly normal for technique. No fracture or dislocation identified. IMPRESSION: Post  LEFT hip arthroplasty without acute abnormality. Electronically Signed   By: Lavonia Dana M.D.   On: 12/12/2017 10:54   Dg Hip Unilat With Pelvis 2-3 Views Left  Result Date: 12/11/2017 CLINICAL DATA:  Left hip pain after falling today. EXAM: DG HIP (WITH OR WITHOUT PELVIS) 2-3V LEFT COMPARISON:  Pelvic and right hip radiographs 03/19/2017. FINDINGS: The bones appear mildly demineralized. There is an acute fracture of the mid left femoral neck with resulting moderate varus angulation. The left femoral head remains located. There is no evidence of pelvic fracture. Mild degenerative changes are present at both hips and sacroiliac joints. There are moderate degenerative changes in the visualized lower lumbar spine. Multiple pelvic calcifications are similar to previous study, likely phleboliths. IMPRESSION: Acute, moderately angulated fracture of the mid left femoral neck. Electronically Signed   By: Richardean Sale M.D.   On: 12/11/2017 17:54        Scheduled Meds: . docusate sodium  100 mg Oral BID  . enoxaparin (LOVENOX) injection  40 mg Subcutaneous Q24H  . ezetimibe  10 mg Oral Daily  . FLUoxetine  60 mg Oral Daily  . lamoTRIgine  100 mg Oral Daily  . levothyroxine  75 mcg Oral QAC breakfast  . omega-3 acid ethyl esters  2 g Oral Daily  . pantoprazole  40 mg Oral Daily  . QUEtiapine  300 mg Oral QHS  . rosuvastatin  40 mg Oral Daily  . senna  1 tablet Oral BID   Continuous Infusions: . methocarbamol (ROBAXIN) IV       LOS: 2 days    Time spent: 35 minutes.     Mercy Riding, MD [facility] Pager [phone]  If 7PM-7AM, please contact night-coverage [email or web address] Password [facility] 12/13/2017, 11:29 AM

## 2017-12-13 NOTE — Evaluation (Signed)
Physical Therapy Evaluation Patient Details Name: Shari Horn MRN: [id number] DOB: [birth year] Today's Date: 12/13/2017   History of Present Illness  77 y.o. female with medical history significant of hypothyroidism, hld, bipolar  Was on her way to the movies, stumbled and fell down on the left side. She was found to have left hip fracture and underwent left hip arthroplasty on 12/12/2017.  Clinical Impression  PTA pt living alone in town home with 4 steps to enter, master bath and bedroom on main floor. Pt was independent without AD. Pt reports history of falls last year but steadiness has improved since seeing PT for balance issues. Pt currently, limited in safe mobility by increased L hip pain with weightbearing. Pt requires min A for bed mobility, transfers and ambulation of 12 feet with RW. PT encouraged pt to ask sister to stay with her at d/c for safety. With 24 supervision initially on d/c PT recommending HHPT. PT will continue to follow acutely.     Follow Up Recommendations Home health PT;Supervision/Assistance - 24 hour    Equipment Recommendations  Rolling walker with 5" wheels;3in1 (PT)    Recommendations for Other Services OT consult     Precautions / Restrictions Precautions Precautions: Fall Precaution Comments: hx of falls  Restrictions Weight Bearing Restrictions: Yes LLE Weight Bearing: Weight bearing as tolerated      Mobility  Bed Mobility Overal bed mobility: Needs Assistance Bed Mobility: Supine to Sit     Supine to sit: Min assist     General bed mobility comments: minA for management of L LE to floor, vc for scooting hips toward EoB  Transfers Overall transfer level: Needs assistance Equipment used: Rolling walker (2 wheeled) Transfers: Sit to/from Stand Sit to Stand: Min assist         General transfer comment: minA for power up and steadying with RW, vc for hand placement for power up   Ambulation/Gait Ambulation/Gait assistance: Min  assist Gait Distance (Feet): 12 Feet(1x3', 1x12') Assistive device: Rolling walker (2 wheeled) Gait Pattern/deviations: Step-to pattern;Step-through pattern;Shuffle;Antalgic;Decreased step length - right;Decreased stance time - left;Decreased weight shift to left Gait velocity: slowed Gait velocity interpretation: <1.8 ft/sec, indicate of risk for recurrent falls General Gait Details: minA for steadying with RW, no LoB, no knee buckling, vc for sequencing, initially ambulated with step to pattern able to progress to step through pattern      Balance Overall balance assessment: Needs assistance Sitting-balance support: Feet supported;No upper extremity supported Sitting balance-Leahy Scale: Good     Standing balance support: Bilateral upper extremity supported Standing balance-Leahy Scale: Poor Standing balance comment: requires RW assist to maintain balance                             Pertinent Vitals/Pain Pain Assessment: Faces Faces Pain Scale: Hurts little more Pain Location: L hip with weight bearing Pain Descriptors / Indicators: Grimacing;Guarding Pain Intervention(s): Limited activity within patient's tolerance;Monitored during session;Repositioned    Home Living Family/patient expects to be discharged to:: Private residence Living Arrangements: Alone Available Help at Discharge: Family;Available 24 hours/day Type of Home: House Home Access: Stairs to enter Entrance Stairs-Rails: Right Entrance Stairs-Number of Steps: 4 Home Layout: Two level Home Equipment: Cane - single point;Grab bars - tub/shower      Prior Function Level of Independence: Independent         Comments: drive         Extremity/Trunk Assessment   Upper  Extremity Assessment Upper Extremity Assessment: Overall WFL for tasks assessed    Lower Extremity Assessment Lower Extremity Assessment: RLE deficits/detail;LLE deficits/detail RLE Deficits / Details: AROM and Strength  WFL LLE Deficits / Details: s/p direct anterior THA, Hip flexion limited due to pain, knee and ankle ROM WFL, strength grossly assessed in knee and ankle 4/5  LLE: Unable to fully assess due to pain    Cervical / Trunk Assessment Cervical / Trunk Assessment: Normal  Communication   Communication: No difficulties  Cognition Arousal/Alertness: Awake/alert Behavior During Therapy: WFL for tasks assessed/performed Overall Cognitive Status: Within Functional Limits for tasks assessed                                        General Comments General comments (skin integrity, edema, etc.): Incision site with mild edema, no drainage noted, VSS,         Assessment/Plan    PT Assessment Patient needs continued PT services  PT Problem List Decreased activity tolerance;Decreased balance;Decreased mobility;Decreased strength;Decreased range of motion;Decreased safety awareness;Pain       PT Treatment Interventions DME instruction;Gait training;Stair training;Functional mobility training;Therapeutic activities;Therapeutic exercise;Balance training;Cognitive remediation;Patient/family education    PT Goals (Current goals can be found in the Care Plan section)  Acute Rehab PT Goals Patient Stated Goal: go home PT Goal Formulation: With patient Time For Goal Achievement: 12/27/17 Potential to Achieve Goals: Good    Frequency 7X/week   Barriers to discharge Decreased caregiver support encouraged pt to ask sister to stay with her when she first goes home       AM-PAC PT "6 Clicks" Mobility  Outcome Measure Help needed turning from your back to your side while in a flat bed without using bedrails?: A Little Help needed moving from lying on your back to sitting on the side of a flat bed without using bedrails?: A Little Help needed moving to and from a bed to a chair (including a wheelchair)?: A Little Help needed standing up from a chair using your arms (e.g., wheelchair or  bedside chair)?: A Little Help needed to walk in hospital room?: A Little Help needed climbing 3-5 steps with a railing? : A Lot 6 Click Score: 17    End of Session Equipment Utilized During Treatment: Gait belt Activity Tolerance: Patient limited by pain Patient left: in chair;with call bell/phone within reach;with chair alarm set Nurse Communication: Mobility status;Weight bearing status PT Visit Diagnosis: Unsteadiness on feet (R26.81);Other abnormalities of gait and mobility (R26.89);Muscle weakness (generalized) (M62.81);Difficulty in walking, not elsewhere classified (R26.2);Pain;History of falling (Z91.81) Pain - Right/Left: Left Pain - part of body: Hip    Time: 5681-2751 PT Time Calculation (min) (ACUTE ONLY): 33 min   Charges:   PT Evaluation $PT Eval Moderate Complexity: 1 Mod PT Treatments $Gait Training: 8-22 mins        Tanisha Lutes B. Migdalia Dk PT, DPT Acute Rehabilitation Services Pager [phone] Office [phone]   [place] 12/13/2017, 12:27 PM

## 2017-12-14 ENCOUNTER — Encounter (HOSPITAL_COMMUNITY): Payer: Self-pay | Admitting: Orthopedic Surgery

## 2017-12-14 LAB — CBC
HCT: 28.7 % — ABNORMAL LOW (ref 36.0–46.0)
HEMATOCRIT: 27.3 % — AB (ref 36.0–46.0)
HEMOGLOBIN: 9.2 g/dL — AB (ref 12.0–15.0)
Hemoglobin: 8.9 g/dL — ABNORMAL LOW (ref 12.0–15.0)
MCH: 30.7 pg (ref 26.0–34.0)
MCH: 30.7 pg (ref 26.0–34.0)
MCHC: 32.6 g/dL (ref 30.0–36.0)
MCHC: 32.1 g/dL (ref 30.0–36.0)
MCV: 94.1 fL (ref 80.0–100.0)
MCV: 95.7 fL (ref 80.0–100.0)
NRBC: 0 % (ref 0.0–0.2)
PLATELETS: 142 10*3/uL — AB (ref 150–400)
PLATELETS: 162 10*3/uL (ref 150–400)
RBC: 2.9 MIL/uL — ABNORMAL LOW (ref 3.87–5.11)
RBC: 3 MIL/uL — AB (ref 3.87–5.11)
RDW: 12.6 % (ref 11.5–15.5)
RDW: 12.6 % (ref 11.5–15.5)
WBC: 9.7 10*3/uL (ref 4.0–10.5)
WBC: 9.3 10*3/uL (ref 4.0–10.5)
nRBC: 0 % (ref 0.0–0.2)

## 2017-12-14 LAB — BASIC METABOLIC PANEL
ANION GAP: 5 (ref 5–15)
BUN: 15 mg/dL (ref 8–23)
CALCIUM: 8.5 mg/dL — AB (ref 8.9–10.3)
CO2: 24 mmol/L (ref 22–32)
Chloride: 105 mmol/L (ref 98–111)
Creatinine, Ser: 0.96 mg/dL (ref 0.44–1.00)
GFR calc Af Amer: >60 mL/min (ref >60)
GFR calc non Af Amer: 55 mL/min — ABNORMAL LOW (ref >60)
GLUCOSE: 113 mg/dL — AB (ref 70–99)
Potassium: 3.8 mmol/L (ref 3.5–5.1)
Sodium: 134 mmol/L — ABNORMAL LOW (ref 135–145)

## 2017-12-14 MED ORDER — POLYETHYLENE GLYCOL 3350 17 G PO PACK
17.0000 g | PACK | Freq: Every day | ORAL | Status: DC
  Administered 2017-12-14 – 2017-12-15 (×2): 17 g via ORAL
  Filled 2017-12-14 (×2): qty 1

## 2017-12-14 NOTE — Progress Notes (Signed)
Physical Therapy Treatment Patient Details Name: Shari Horn MRN: [id number] DOB: 10-05-1940 Today's Date: 12/14/2017    History of Present Illness 77 y.o. female with medical history significant of hypothyroidism, hld, bipolar  Was on her way to the movies, stumbled and fell down on the left side. She was found to have left hip fracture and underwent left hip arthroplasty on 12/12/2017.    PT Comments    Pt making slow progress towards her goals today, as she is limited in her safe mobility by increased pain in her L hip with movement. Pt requires min A for bed mobility and transfers and modA for ambulation to and from the bathroom. Pt requires maximal verbal cuing for sequencing of mobility. Given her increased needs for assistance and her decreased availability of help at d/c, PT currently recommending SNF level rehab at d/c. PT will continue to follow acutely.   Follow Up Recommendations  SNF     Equipment Recommendations  Rolling walker with 5" wheels;3in1 (PT)    Recommendations for Other Services OT consult     Precautions / Restrictions Precautions Precautions: Fall Precaution Comments: hx of falls  Restrictions Weight Bearing Restrictions: Yes LLE Weight Bearing: Weight bearing as tolerated    Mobility  Bed Mobility Overal bed mobility: Needs Assistance Bed Mobility: Supine to Sit     Supine to sit: Min assist     General bed mobility comments: minA for management of L LE to floor, vc for reaching for use of RLE to help in movement of L LE, reaching for bedrail with L UE to aid in truncal rotation,  scooting hips toward EoB  Transfers Overall transfer level: Needs assistance Equipment used: Rolling walker (2 wheeled) Transfers: Sit to/from Stand Sit to Stand: Min assist         General transfer comment: minA for power up and steadying with RW, vc for hand placement for power up   Ambulation/Gait Ambulation/Gait assistance: Mod assist Gait Distance  (Feet): 20 Feet(2x10) Assistive device: Rolling walker (2 wheeled) Gait Pattern/deviations: Step-to pattern;Shuffle;Antalgic;Decreased step length - right;Decreased stance time - left;Decreased weight shift to left;Trunk flexed Gait velocity: slowed Gait velocity interpretation: <1.8 ft/sec, indicate of risk for recurrent falls General Gait Details: modA for steadying and maximal verbal cuing for sequencing with increased UE support and L LE extension in order to advance RLE, maximal cuing for turning completely around prior to attempting to sit on both the toilet and the recliner   Stairs             Wheelchair Mobility    Modified Rankin (Stroke Patients Only)       Balance Overall balance assessment: Needs assistance Sitting-balance support: Feet supported;No upper extremity supported Sitting balance-Leahy Scale: Good     Standing balance support: Bilateral upper extremity supported Standing balance-Leahy Scale: Poor Standing balance comment: requires RW assist to maintain balance                            Cognition Arousal/Alertness: Awake/alert Behavior During Therapy: WFL for tasks assessed/performed Overall Cognitive Status: Within Functional Limits for tasks assessed                                 General Comments: poor body awareness      Exercises      General Comments General comments (skin integrity, edema, etc.): increased edema around  inscision      Pertinent Vitals/Pain Pain Assessment: Faces Faces Pain Scale: Hurts even more Pain Location: L hip with weight bearing Pain Descriptors / Indicators: Grimacing;Guarding Pain Intervention(s): Limited activity within patient's tolerance;Monitored during session;Repositioned           PT Goals (current goals can now be found in the care plan section) Acute Rehab PT Goals Patient Stated Goal: go home PT Goal Formulation: With patient Time For Goal Achievement:  12/27/17 Potential to Achieve Goals: Good Progress towards PT goals: Progressing toward goals    Frequency    Min 3X/week      PT Plan Discharge plan needs to be updated;Frequency needs to be updated       AM-PAC PT "6 Clicks" Mobility   Outcome Measure  Help needed turning from your back to your side while in a flat bed without using bedrails?: A Little Help needed moving from lying on your back to sitting on the side of a flat bed without using bedrails?: A Little Help needed moving to and from a bed to a chair (including a wheelchair)?: A Lot Help needed standing up from a chair using your arms (e.g., wheelchair or bedside chair)?: A Lot Help needed to walk in hospital room?: A Lot Help needed climbing 3-5 steps with a railing? : Total 6 Click Score: 13    End of Session Equipment Utilized During Treatment: Gait belt Activity Tolerance: Patient limited by pain Patient left: in chair;with call bell/phone within reach;with chair alarm set Nurse Communication: Mobility status;Weight bearing status PT Visit Diagnosis: Unsteadiness on feet (R26.81);Other abnormalities of gait and mobility (R26.89);Muscle weakness (generalized) (M62.81);Difficulty in walking, not elsewhere classified (R26.2);Pain;History of falling (Z91.81) Pain - Right/Left: Left Pain - part of body: Hip     Time: 1210-1232 PT Time Calculation (min) (ACUTE ONLY): 22 min  Charges:  $Gait Training: 8-22 mins $Therapeutic Activity: 8-22 mins                     Alonte Wulff B. Migdalia Dk PT, DPT Acute Rehabilitation Services Pager [phone] Office [phone]    [place] 12/14/2017, 1:25 PM

## 2017-12-14 NOTE — NC FL2 (Signed)
[place] MEDICAID FL2 LEVEL OF CARE SCREENING TOOL     IDENTIFICATION  Patient Name: Shari Horn Birthdate: 17-Mar-1940 Sex: female Admission Date (Current Location): 12/11/2017  [facility] and [place] Number:  Herbalist and Address:  [facility], [place] [address], [place], [place] [address]      Provider Number: [id number]  Attending Physician Name and Address:  Domenic Polite, MD  Relative Name and Phone Number:  [name] [phone]    Current Level of Care: Hospital Recommended Level of Care: [place] Prior Approval Number: (under review)  Date Approved/Denied:   PASRR Number:    Discharge Plan: SNF    Current Diagnoses: Patient Active Problem List   Diagnosis Date Noted  . Closed displaced fracture of left femoral neck ([place]) 12/12/2017  . Hip fracture ([place]) 12/11/2017  . Trochanteric bursitis of right hip 03/19/2017  . Rib pain on right side 03/19/2017  . Forgetfulness 03/10/2017  . Frequent falls 01/25/2017  . Gastroesophageal reflux disease without esophagitis 10/12/2016  . Osteopenia 09/17/2016  . Dysphagia 09/14/2016  . Bipolar 2 disorder ([place]) 09/14/2016  . Hypothyroidism 09/14/2016  . New onset of headaches after age 55 05/27/2016  . Positional vertigo 09/08/2013  . Bilateral carotid artery disease ([place]) 12/20/2012  . PVC's (premature ventricular contractions) 12/20/2012  . Dyslipidemia 12/20/2012    Orientation RESPIRATION BLADDER Height & Weight     Self, Time, Situation, Place  Normal Continent Weight: 168 lb (76.2 kg) Height:  5\' 7"  (170.2 cm)  BEHAVIORAL SYMPTOMS/MOOD NEUROLOGICAL BOWEL NUTRITION STATUS      Continent Diet(see discharge summary)  AMBULATORY STATUS COMMUNICATION OF NEEDS Skin   Limited Assist Verbally Surgical wounds(closed surgical incision left anterior hip)                       Personal Care Assistance Level of Assistance  Bathing, Feeding, Dressing, Total care  Bathing Assistance: Limited assistance Feeding assistance: Independent Dressing Assistance: Limited assistance Total Care Assistance: Limited assistance   Functional Limitations Info  Hearing, Speech, Sight Sight Info: Adequate Hearing Info: Adequate Speech Info: Adequate    SPECIAL CARE FACTORS FREQUENCY  PT (By licensed PT), OT (By licensed OT)     PT Frequency: min 5x weekly OT Frequency: min 3x weekly            Contractures Contractures Info: Not present    Additional Factors Info  Code Status, Allergies Code Status Info: full Allergies Info: Allergies:  Codeine           Current Medications (12/14/2017):  This is the current hospital active medication list Current Facility-Administered Medications  Medication Dose Route Frequency Provider Last Rate Last Dose  . acetaminophen (TYLENOL) tablet 325-650 mg  325-650 mg Oral Q6H PRN Swinteck, Aaron Edelman, MD      . calcium carbonate (TUMS - dosed in mg elemental calcium) chewable tablet 200 mg of elemental calcium  1 tablet Oral BID WC Gonfa, Taye T, MD   200 mg of elemental calcium at 12/14/17 0856  . cholecalciferol (VITAMIN D3) tablet 1,000 Units  1,000 Units Oral Daily Mercy Riding, MD   1,000 Units at 12/14/17 0856  . clonazePAM (KLONOPIN) tablet 0.5 mg  0.5 mg Oral TID PRN Hosie Poisson, MD      . docusate sodium (COLACE) capsule 100 mg  100 mg Oral BID Rod Can, MD   100 mg at 12/14/17 0856  . enoxaparin (LOVENOX) injection 40 mg  40 mg Subcutaneous Q24H Rod Can, MD   40 mg at 12/14/17 0857  . ezetimibe (ZETIA) tablet 10 mg  10 mg Oral Daily Hosie Poisson, MD   10 mg at 12/14/17 0856  . FLUoxetine (PROZAC) capsule 60 mg  60 mg Oral Daily Hosie Poisson, MD   60 mg at 12/14/17 0856  . HYDROcodone-acetaminophen (NORCO) 7.5-325 MG per tablet 1-2 tablet  1-2 tablet Oral Q4H PRN Rod Can, MD   1 tablet at 12/13/17 0847  . HYDROcodone-acetaminophen (NORCO/VICODIN) 5-325 MG per tablet 1-2 tablet  1-2  tablet Oral Q4H PRN Rod Can, MD   2 tablet at 12/13/17 2307  . lamoTRIgine (LAMICTAL) tablet 100 mg  100 mg Oral Daily Hosie Poisson, MD   100 mg at 12/14/17 0857  . levothyroxine (SYNTHROID, LEVOTHROID) tablet 75 mcg  75 mcg Oral QAC breakfast Hosie Poisson, MD   75 mcg at 12/14/17 3662  . menthol-cetylpyridinium (CEPACOL) lozenge 3 mg  1 lozenge Oral PRN Swinteck, Aaron Edelman, MD       Or  . phenol (CHLORASEPTIC) mouth spray 1 spray  1 spray Mouth/Throat PRN Swinteck, Aaron Edelman, MD      . methocarbamol (ROBAXIN) tablet 500 mg  500 mg Oral Q6H PRN Swinteck, Aaron Edelman, MD       Or  . methocarbamol (ROBAXIN) 500 mg in dextrose 5 % 50 mL IVPB  500 mg Intravenous Q6H PRN Swinteck, Aaron Edelman, MD      . morphine 2 MG/ML injection 0.5-1 mg  0.5-1 mg Intravenous Q2H PRN Swinteck, Aaron Edelman, MD      . omega-3 acid ethyl esters (LOVAZA) capsule 2 g  2 g Oral Daily Hosie Poisson, MD   2 g at 12/14/17 0856  . ondansetron (ZOFRAN) tablet 4 mg  4 mg Oral Q6H PRN Swinteck, Aaron Edelman, MD       Or  . ondansetron (ZOFRAN) injection 4 mg  4 mg Intravenous Q6H PRN Swinteck, Aaron Edelman, MD      . pantoprazole (PROTONIX) EC tablet 40 mg  40 mg Oral Daily Hosie Poisson, MD   40 mg at 12/14/17 0855  . QUEtiapine (SEROQUEL) tablet 300 mg  300 mg Oral QHS Harvel Quale, RPH   300 mg at 12/13/17 2255  . rosuvastatin (CRESTOR) tablet 40 mg  40 mg Oral Daily Hosie Poisson, MD   40 mg at 12/14/17 0855  . senna (SENOKOT) tablet 8.6 mg  1 tablet Oral BID Rod Can, MD   8.6 mg at 12/14/17 9476     Discharge Medications: Please see discharge summary for a list of discharge medications.  Relevant Imaging Results:  Relevant Lab Results:   Additional Information SSN: [id number]  Alberteen Sam, LCSW

## 2017-12-14 NOTE — Clinical Social Work Note (Signed)
Clinical Social Work Assessment  Patient Details  Name: Shari Horn MRN: [id number] Date of Birth: 1940/06/27  Date of referral:  12/14/17               Reason for consult:  Discharge Planning                Permission sought to share information with:  Case Manager, Facility Sport and exercise psychologist, Family Supports Permission granted to share information::  Yes, Verbal Permission Granted  Name::     Music therapist::  SNFs  Relationship::  daughter  Contact Information:  [phone]  Housing/Transportation Living arrangements for the past 2 months:  Single Family Home Source of Information:  Patient Patient Interpreter Needed:  None Criminal Activity/Legal Involvement Pertinent to Current Situation/Hospitalization:  No - Comment as needed Significant Relationships:  Adult Children Lives with:  Self Do you feel safe going back to the place where you live?  No Need for family participation in patient care:  Yes (Comment)  Care giving concerns:  CSW received referral for possible SNF placement at time of discharge. Spoke with patient regarding possibility of SNF placement . Patient's daughter Shari Horn is currently unable to care for her at their home given patient's current needs and fall risk.  Patient and daughter expressed understanding of PT recommendation and are agreeable to SNF placement at time of discharge. CSW to continue to follow and assist with discharge planning needs.     Social Worker assessment / plan:  Spoke with patient and daughter    concerning possibility of rehab at [facility] before returning home.    Employment status:  Retired Nurse, adult PT Recommendations:  [place] / Referral to community resources:  [place]  Patient/Family's Response to care:  Patient and daughter  recognize need for rehab before returning home and are agreeable to a SNF in [place]. They report preference for  [facility] or [facility]    . CSW explained insurance authorization process. Patient's family reported that they want patient to get stronger to be able to come back home. Daughter expressed wanting patient to SNF before thanksgiving in two days, CSW explained process with [place] [facility] and current time frame taking up to 3 days with possible early next week discharge due to holiday week.    Patient/Family's Understanding of and Emotional Response to Diagnosis, Current Treatment, and Prognosis: Patient/family is realistic regarding therapy needs and expressed being hopeful for SNF placement. Patient expressed understanding of CSW role and discharge process as well as medical condition. No questions/concerns about plan or treatment.    Emotional Assessment Appearance:  Appears stated age Attitude/Demeanor/Rapport:  Self-Confident, Gracious Affect (typically observed):  Accepting Orientation:  Oriented to Self, Oriented to Place, Oriented to  Time, Oriented to Situation Alcohol / Substance use:  Not Applicable Psych involvement (Current and /or in the community):  No (Comment)  Discharge Needs  Concerns to be addressed:  Discharge Planning Concerns Readmission within the last 30 days:  No Current discharge risk:  Dependent with Mobility Barriers to Discharge:  Continued Medical Work up   [employer], LCSW 12/14/2017, 10:21 AM

## 2017-12-14 NOTE — Progress Notes (Signed)
PASRR #:   [id number] [place], [place]

## 2017-12-14 NOTE — Progress Notes (Signed)
PROGRESS NOTE    Shari Horn  CHY:[id number] DOB: 12/19/1940 DOA: 12/11/2017 PCP: Vivi Barrack, MD    Brief Narrative:  Shari Horn is a 77 y.o. female with medical history significant of hypothyroidism, hld, bipolar  Was on her way to the movies, stumbled and fell down on the left side. She was found to have left hip fracture and underwent left hip arthroplasty on 12/12/2017.  Assessment & Plan:   Closed displaced fracture of the left femoral neck:  -s/p arthroplasty on 12/12/2017.  Surgical site looks clean. -Continue pain management. -Currently on Lovenox for DVT prophylaxis, per orthopedics discharge home on aspirin -Vitamin D and calcium supplementation -PT eval completed, plan for SNF for rehabilitation -Medically stable for rehab when bed available  Hypothyroidism: Stable -Continue home Synthroid  Hyperlipidemia: Stable -Continue home Crestor and Zetia  Postoperative anemia -Due to intraoperative blood loss and hemodilution -Hemoglobin down to 8.9 this morning, will repeat this afternoon -monitor CBC, no indication for transfusion  MILD AKI: Resolved -Continue trending  Bipolar disorder: Stable.-Continue home meds  DVT prophylaxis: lovenox.  Code Status:  Full code.  Family Communication: daughter at bedside. Disposition Plan: SNF when bed available  Consultants:   Orthopedics Dr Lyla Glassing   Procedures:  Left total hip arthroplasty.    Antimicrobials: none.    Subjective: -feels well, mild constipation  Objective: Vitals:   12/13/17 2321 12/14/17 0225 12/14/17 1100 12/14/17 1301  BP: (!) 107/51 (!) 109/49  (!) 113/45  Pulse: 84 88 79 75  Resp:  18  18  Temp:  98.3 F (36.8 C)  99.3 F (37.4 C)  TempSrc:  Oral  Oral  SpO2: 94% (!) 86% 94% 96%  Weight:      Height:        Intake/Output Summary (Last 24 hours) at 12/14/2017 1349 Last data filed at 12/14/2017 1100 Gross per 24 hour  Intake 360 ml  Output -  Net 360 ml   Filed Weights     12/11/17 1520  Weight: 76.2 kg    Examination: Gen: Awake, Alert, Oriented X 3, no distress HEENT: PERRLA, Neck supple, no JVD Lungs: Good air movement bilaterally, CTAB CVS: RRR,No Gallops,Rubs or new Murmurs Abd: soft, Non tender, non distended, BS present Extremities: surgical dressing intact, no edema Skin: no new rashes  Data Reviewed: I have personally reviewed following labs and imaging studies  CBC: Recent Labs  Lab 12/11/17 1633 12/12/17 0507 12/12/17 1328 12/13/17 0721 12/14/17 0403 12/14/17 1200  WBC 10.0 8.8 11.3* 11.3* 9.7 9.3  NEUTROABS 6.8  --   --   --   --   --   HGB 13.4 12.5 11.5* 10.0* 8.9* 9.2*  HCT 40.7 37.6 36.6 30.9* 27.3* 28.7*  MCV 94.4 92.4 95.6 94.5 94.1 95.7  PLT 218 205 201 177 142* 878   Basic Metabolic Panel: Recent Labs  Lab 12/11/17 1633 12/12/17 0507 12/12/17 1328 12/13/17 0721 12/14/17 0403  NA 136 131*  --  137 134*  K 4.2 3.9  --  4.4 3.8  CL 104 101  --  105 105  CO2 25 21*  --  29 24  GLUCOSE 105* 95  --  133* 113*  BUN 28* 17  --  13 15  CREATININE 1.02* 0.98 0.81 0.87 0.96  CALCIUM 9.5 9.0  --  8.7* 8.5*   GFR: Estimated Creatinine Clearance: 51.4 mL/min (by C-G formula based on SCr of 0.96 mg/dL). Liver Function Tests: No results for input(s): AST,  ALT, ALKPHOS, BILITOT, PROT, ALBUMIN in the last 168 hours. No results for input(s): LIPASE, AMYLASE in the last 168 hours. No results for input(s): AMMONIA in the last 168 hours. Coagulation Profile: Recent Labs  Lab 12/11/17 1633  INR 0.99   Cardiac Enzymes: No results for input(s): CKTOTAL, CKMB, CKMBINDEX, TROPONINI in the last 168 hours. BNP (last 3 results) No results for input(s): PROBNP in the last 8760 hours. HbA1C: No results for input(s): HGBA1C in the last 72 hours. CBG: No results for input(s): GLUCAP in the last 168 hours. Lipid Profile: No results for input(s): CHOL, HDL, LDLCALC, TRIG, CHOLHDL, LDLDIRECT in the last 72 hours. Thyroid Function  Tests: No results for input(s): TSH, T4TOTAL, FREET4, T3FREE, THYROIDAB in the last 72 hours. Anemia Panel: No results for input(s): VITAMINB12, FOLATE, FERRITIN, TIBC, IRON, RETICCTPCT in the last 72 hours. Sepsis Labs: No results for input(s): PROCALCITON, LATICACIDVEN in the last 168 hours.  Recent Results (from the past 240 hour(s))  Surgical PCR screen     Status: None   Collection Time: 12/12/17  1:40 AM  Result Value Ref Range Status   MRSA, PCR NEGATIVE NEGATIVE Final   Staphylococcus aureus NEGATIVE NEGATIVE Final    Comment: (NOTE) The Xpert SA Assay (FDA approved for NASAL specimens in patients 68 years of age and older), is one component of a comprehensive surveillance program. It is not intended to diagnose infection nor to guide or monitor treatment. Performed at [facility], [place] [address], [place], [place] [address]          Radiology Studies: No results found.      Scheduled Meds: . calcium carbonate  1 tablet Oral BID WC  . cholecalciferol  1,000 Units Oral Daily  . docusate sodium  100 mg Oral BID  . enoxaparin (LOVENOX) injection  40 mg Subcutaneous Q24H  . ezetimibe  10 mg Oral Daily  . FLUoxetine  60 mg Oral Daily  . lamoTRIgine  100 mg Oral Daily  . levothyroxine  75 mcg Oral QAC breakfast  . omega-3 acid ethyl esters  2 g Oral Daily  . pantoprazole  40 mg Oral Daily  . polyethylene glycol  17 g Oral Daily  . QUEtiapine  300 mg Oral QHS  . rosuvastatin  40 mg Oral Daily  . senna  1 tablet Oral BID   Continuous Infusions: . methocarbamol (ROBAXIN) IV       LOS: 3 days    Time spent: 25 minutes.     Domenic Polite, MD [facility] Page via [email or web address]  If 7PM-7AM, please contact night-coverage [email or web address] Password [email or web address] 12/14/2017, 1:49 PM

## 2017-12-14 NOTE — Care Management Important Message (Signed)
Important Message  Patient Details  Name: TIAJAH OYSTER MRN: [id number] Date of Birth: 03/08/1940   Medicare Important Message Given:  Yes    Cristoval Teall P Raydan Schlabach 12/14/2017, 4:02 PM

## 2017-12-14 NOTE — Care Management (Signed)
Case manager spoke with patient and her daughter [name] this am. Patient lives alone and will not be able to care for herself, daughter says SNF is needed. They would like [facility] or [facility]. Case manager will discuss with unit social worker.

## 2017-12-15 LAB — CBC
HCT: 26.7 % — ABNORMAL LOW (ref 36.0–46.0)
HEMOGLOBIN: 8.5 g/dL — AB (ref 12.0–15.0)
MCH: 30.6 pg (ref 26.0–34.0)
MCHC: 31.8 g/dL (ref 30.0–36.0)
MCV: 96 fL (ref 80.0–100.0)
PLATELETS: 175 10*3/uL (ref 150–400)
RBC: 2.78 MIL/uL — AB (ref 3.87–5.11)
RDW: 12.6 % (ref 11.5–15.5)
WBC: 8.9 10*3/uL (ref 4.0–10.5)
nRBC: 0 % (ref 0.0–0.2)

## 2017-12-15 MED ORDER — CLONAZEPAM 0.5 MG PO TABS: 0.5000 mg | ORAL_TABLET | Freq: Every day | ORAL | 0 refills | Status: DC

## 2017-12-15 NOTE — Plan of Care (Signed)
Problem: Education: Goal: Knowledge of General Education information will improve Description Including pain rating scale, medication(s)/side effects and non-pharmacologic comfort measures Outcome: Progressing   Problem: Health Behavior/Discharge Planning: Goal: Ability to manage health-related needs will improve Outcome: Progressing   Problem: Clinical Measurements: Goal: Respiratory complications will improve Outcome: Progressing   Problem: Activity: Goal: Risk for activity intolerance will decrease Outcome: Progressing   Problem: Nutrition: Goal: Adequate nutrition will be maintained Outcome: Progressing   Problem: Coping: Goal: Level of anxiety will decrease Outcome: Progressing   Problem: Elimination: Goal: Will not experience complications related to urinary retention Outcome: Progressing   Problem: Pain Managment: Goal: General experience of comfort will improve Outcome: Progressing   Problem: Safety: Goal: Ability to remain free from injury will improve Outcome: Progressing   Problem: Skin Integrity: Goal: Risk for impaired skin integrity will decrease Outcome: Progressing   

## 2017-12-15 NOTE — Progress Notes (Signed)
AVS, printed prescriptions, and social worker's paperwork placed in discharge packet. Report called and given to [facility], RN at [employer]. All questions answered to satisfaction. [facility] to transport pt. Will continue to monitor.

## 2017-12-15 NOTE — Clinical Social Work Placement (Signed)
   CLINICAL SOCIAL WORK PLACEMENT  NOTE  Date:  12/15/2017  Patient Details  Name: Shari Horn MRN: [id number] Date of Birth: 1940/03/11  Clinical Social Work is seeking post-discharge placement for this patient at the [place] level of care (*CSW will initial, date and re-position this form in  chart as items are completed):      Patient/family provided with [place] Work Department's list of facilities offering this level of care within the geographic area requested by the patient (or if unable, by the patient's family).  Yes   Patient/family informed of their freedom to choose among providers that offer the needed level of care, that participate in Medicare, Medicaid or managed care program needed by the patient, have an available bed and are willing to accept the patient.      Patient/family informed of [facility]'s ownership interest in [facility] and [facility], as well as of the fact that they are under no obligation to receive care at these facilities.  PASRR submitted to EDS on       PASRR number received on 12/14/17     Existing PASRR number confirmed on       FL2 transmitted to all facilities in geographic area requested by pt/family on 12/14/17     FL2 transmitted to all facilities within larger geographic area on       Patient informed that his/her managed care company has contracts with or will negotiate with certain facilities, including the following:        Yes   Patient/family informed of bed offers received.  Patient [facility] bed at [facility]     Physician recommends and patient chooses bed at      Patient to be transferred to [facility] on 12/15/17.  Patient to be transferred to facility by [facility]     Patient family notified on 12/15/17 of transfer.  Name of family member notified:  Skip Mayer (attempted)     PHYSICIAN       Additional Comment:     _______________________________________________ Alberteen Sam, LCSW 12/15/2017, 11:38 AM

## 2017-12-15 NOTE — Discharge Summary (Signed)
Physician Discharge Summary  MIARA EMMINGER POE:[id number] DOB: Mar 19, 1940 DOA: 12/11/2017  PCP: Vivi Barrack, MD  Admit date: 12/11/2017 Discharge date: 12/15/2017  Time spent: 45 minutes  Recommendations for Outpatient Follow-up:  1. SNF for Rehab 2. PCP in 1 week, continue ASA 81mg  BID for 4weeks 3. Ortho Dr.Swinteck in 2weeks   Discharge Diagnoses:  Principal Problem:   Hip fracture ([place])   Post op blood loss anemia   Dyslipidemia   Bipolar 2 disorder (HCC)   Hypothyroidism   Osteopenia   Closed displaced fracture of left femoral neck (HCC)   Discharge Condition: stable  Diet recommendation: heart healthy  Filed Weights   12/11/17 1520  Weight: 76.2 kg    History of present illness:  Shari Horn a 77 y.o.femalewith medical history significant ofhypothyroidism, hld, bipolar  Was on her way to the movies, stumbled and fell down on the left side. She was found to have left hip fracture  Hospital Course:   Closed displaced fracture of the left femoral neck:  -s/p arthroplasty on 12/12/2017 -DVT prophylaxis, per orthopedics discharge home on aspirin 81mg  BID for 4weeks -Vitamin D and calcium supplementation -PT eval completed, plan for SNF for rehabilitation -Medically stable for rehab  Hypothyroidism: Stable -Continue home Synthroid  Hyperlipidemia: Stable -Continue home Crestor and Zetia  Postoperative anemia -Due to intraoperative blood loss and hemodilution -Hemoglobin down to 8.5 to 9 range and stable, no indication for transfusion  MILD AKI: - Resolved  Bipolar disorder: Stable.-Continued home meds  Consultants:   Orthopedics Dr Lyla Glassing   Procedures:  Left total hip arthroplasty.   Discharge Exam: Vitals:   12/15/17 0439 12/15/17 0833  BP: (!) 114/45 133/65  Pulse: 86 86  Resp: 18 18  Temp: 98.5 F (36.9 C) 98.8 F (37.1 C)  SpO2: 93% 98%    General: AAOx3 Cardiovascular: S1S2/RRR Respiratory: CTAB  Discharge  Instructions   Discharge Instructions    Diet - low sodium heart healthy   Complete by:  As directed    Increase activity slowly   Complete by:  As directed      Allergies as of 12/15/2017      Reactions   Codeine Nausea And Vomiting, Nausea Only   Other reaction(s): Unknown      Medication List    STOP taking these medications   traMADol 50 MG tablet Commonly known as:  ULTRAM     TAKE these medications   aspirin EC 81 MG tablet Take 1 tablet (81 mg total) by mouth 2 (two) times daily.   clonazePAM 0.5 MG tablet Commonly known as:  KLONOPIN Take 1 tablet (0.5 mg total) by mouth daily.   ezetimibe 10 MG tablet Commonly known as:  ZETIA TAKE 1 TABLET(10 MG) BY MOUTH DAILY   FLUoxetine 20 MG tablet Commonly known as:  PROZAC Take 60 mg by mouth daily.   HYDROcodone-acetaminophen 5-325 MG tablet Commonly known as:  NORCO/VICODIN Take 1-2 tablets by mouth every 6 (six) hours as needed for moderate pain.   lamoTRIgine 100 MG tablet Commonly known as:  LAMICTAL Take 100 mg by mouth daily.   levothyroxine 75 MCG tablet Commonly known as:  SYNTHROID, LEVOTHROID Take 1 tablet (75 mcg total) by mouth daily.   meclizine 25 MG tablet Commonly known as:  ANTIVERT Take 1 tablet (25 mg total) by mouth 3 (three) times daily as needed for dizziness.   pantoprazole 40 MG tablet Commonly known as:  PROTONIX Take 1 tablet (40 mg total)  by mouth daily.   QUEtiapine 300 MG tablet Commonly known as:  SEROQUEL Take 300 mg by mouth at bedtime.   rosuvastatin 40 MG tablet Commonly known as:  CRESTOR Take 1 tablet (40 mg total) by mouth daily. Please schedule appointment for further refills   senna 8.6 MG Tabs tablet Commonly known as:  SENOKOT Take 5 tablets by mouth at bedtime.      Allergies  Allergen Reactions  . Codeine Nausea And Vomiting and Nausea Only    Other reaction(s): Unknown    Contact information for follow-up providers    Swinteck, Aaron Edelman, MD.  Schedule an appointment as soon as possible for a visit in 2 weeks.   Specialty:  Orthopedic Surgery Why:  For wound re-check Contact information: [address] [place] [address] [phone]            Contact information for after-discharge care    Destination    [facility] .   Service:  Skilled Nursing Contact information: [place] [phone]                   The results of significant diagnostics from this hospitalization (including imaging, microbiology, ancillary and laboratory) are listed below for reference.    Significant Diagnostic Studies: Dg Abd 1 View  Result Date: 11/16/2017 CLINICAL DATA:  77 year old female with right lower quadrant pain for past 2 and half months. Initial encounter. EXAM: ABDOMEN - 1 VIEW COMPARISON:  03/19/2017 right hip films.  09/09/2009 CT. FINDINGS: No evidence of bowel obstruction. The possibility of free intraperitoneal air cannot be assessed on a supine view. Calcifications pelvic inlet most consistent with phleboliths. 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 IMPRESSION: 1. No evidence of bowel obstruction. 2. 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 Electronically Signed   By: Genia Del M.D.   On: 11/16/2017 07:29   Pelvis Portable  Result Date: 12/12/2017 CLINICAL DATA:  Status post LEFT hip arthroplasty EXAM: PORTABLE PELVIS 1-2 VIEWS COMPARISON:  12/12/2017 FINDINGS: LEFT hip arthroplasty changes noted without complicating features. No acute fracture or dislocation. Degenerative changes in the LOWER lumbar spine noted. IMPRESSION: LEFT hip arthroplasty without complicating features. Electronically Signed   By: Margarette Canada M.D.   On: 12/12/2017 14:12   Dg Chest Port 1 View  Result Date: 12/11/2017 CLINICAL DATA:  77 year old who fell onto her LEFT side while at a theater earlier  today and sustained a LEFT femoral neck fracture. Preoperative respiratory evaluation. EXAM: PORTABLE CHEST 1 VIEW COMPARISON:  01/16/2017, 01/10/2014, 08/22/2007. FINDINGS: Cardiac silhouette normal in size, unchanged. Thoracic aorta mildly atherosclerotic, unchanged. Hilar and mediastinal contours otherwise unremarkable. Stable mild chronic elevation of the RIGHT hemidiaphragm. Lungs clear. Bronchovascular markings normal. Pulmonary vascularity normal. No visible pleural effusions. No pneumothorax. IMPRESSION: 1.  No acute cardiopulmonary disease. 2.  Aortic Atherosclerosis (ICD10-170.0) Electronically Signed   By: Evangeline Dakin M.D.   On: 12/11/2017 17:54   Dg Knee Complete 4 Views Left  Result Date: 12/12/2017 CLINICAL DATA:  Fall, status post hip fracture. EXAM: LEFT KNEE - COMPLETE 4+ VIEW COMPARISON:  None. FINDINGS: Four views of the LEFT knee are provided. Osseous alignment is normal. No fracture line or displaced fracture fragment seen. Faint calcifications within the joint space is compatible with chondrocalcinosis suggesting underlying CPPD. No large osteophytes or other signs of advanced DJD. No appreciable joint effusion and adjacent soft tissues are unremarkable. IMPRESSION: No acute findings. Electronically Signed  By: Franki Cabot M.D.   On: 12/12/2017 00:01   Dg C-arm 1-60 Min  Result Date: 12/12/2017 CLINICAL DATA:  Total hip arthroplasty LEFT EXAM: DG C-ARM 61-120 MIN; OPERATIVE RIGHT HIP WITH PELVIS COMPARISON:  12/11/2017 FLUOROSCOPY TIME:  0 minutes 18 seconds Images obtained: 3 FINDINGS: Images demonstrate placement of a LEFT hip prosthesis. Osseous mineralization grossly normal for technique. No fracture or dislocation identified. IMPRESSION: Post LEFT hip arthroplasty without acute abnormality. Electronically Signed   By: Lavonia Dana M.D.   On: 12/12/2017 10:54   Dg Hip Operative Unilat W Or W/o Pelvis Right  Result Date: 12/12/2017 CLINICAL DATA:  Total hip arthroplasty  LEFT EXAM: DG C-ARM 61-120 MIN; OPERATIVE RIGHT HIP WITH PELVIS COMPARISON:  12/11/2017 FLUOROSCOPY TIME:  0 minutes 18 seconds Images obtained: 3 FINDINGS: Images demonstrate placement of a LEFT hip prosthesis. Osseous mineralization grossly normal for technique. No fracture or dislocation identified. IMPRESSION: Post LEFT hip arthroplasty without acute abnormality. Electronically Signed   By: Lavonia Dana M.D.   On: 12/12/2017 10:54   Dg Hip Unilat With Pelvis 2-3 Views Left  Result Date: 12/11/2017 CLINICAL DATA:  Left hip pain after falling today. EXAM: DG HIP (WITH OR WITHOUT PELVIS) 2-3V LEFT COMPARISON:  Pelvic and right hip radiographs 03/19/2017. FINDINGS: The bones appear mildly demineralized. There is an acute fracture of the mid left femoral neck with resulting moderate varus angulation. The left femoral head remains located. There is no evidence of pelvic fracture. Mild degenerative changes are present at both hips and sacroiliac joints. There are moderate degenerative changes in the visualized lower lumbar spine. Multiple pelvic calcifications are similar to previous study, likely phleboliths. IMPRESSION: Acute, moderately angulated fracture of the mid left femoral neck. Electronically Signed   By: Richardean Sale M.D.   On: 12/11/2017 17:54    Microbiology: Recent Results (from the past 240 hour(s))  Surgical PCR screen     Status: None   Collection Time: 12/12/17  1:40 AM  Result Value Ref Range Status   MRSA, PCR NEGATIVE NEGATIVE Final   Staphylococcus aureus NEGATIVE NEGATIVE Final    Comment: (NOTE) The Xpert SA Assay (FDA approved for NASAL specimens in patients 29 years of age and older), is one component of a comprehensive surveillance program. It is not intended to diagnose infection nor to guide or monitor treatment. Performed at [facility], [place] [address] [place], [place] [address]      Labs: Basic Metabolic Panel: Recent Labs  Lab 12/11/17 1633  12/12/17 0507 12/12/17 1328 12/13/17 0721 12/14/17 0403  NA 136 131*  --  137 134*  K 4.2 3.9  --  4.4 3.8  CL 104 101  --  105 105  CO2 25 21*  --  29 24  GLUCOSE 105* 95  --  133* 113*  BUN 28* 17  --  13 15  CREATININE 1.02* 0.98 0.81 0.87 0.96  CALCIUM 9.5 9.0  --  8.7* 8.5*   Liver Function Tests: No results for input(s): AST, ALT, ALKPHOS, BILITOT, PROT, ALBUMIN in the last 168 hours. No results for input(s): LIPASE, AMYLASE in the last 168 hours. No results for input(s): AMMONIA in the last 168 hours. CBC: Recent Labs  Lab 12/11/17 1633  12/12/17 1328 12/13/17 0721 12/14/17 0403 12/14/17 1200 12/15/17 0224  WBC 10.0   < > 11.3* 11.3* 9.7 9.3 8.9  NEUTROABS 6.8  --   --   --   --   --   --  HGB 13.4   < > 11.5* 10.0* 8.9* 9.2* 8.5*  HCT 40.7   < > 36.6 30.9* 27.3* 28.7* 26.7*  MCV 94.4   < > 95.6 94.5 94.1 95.7 96.0  PLT 218   < > 201 177 142* 162 175   < > = values in this interval not displayed.   Cardiac Enzymes: No results for input(s): CKTOTAL, CKMB, CKMBINDEX, TROPONINI in the last 168 hours. BNP: BNP (last 3 results) No results for input(s): BNP in the last 8760 hours.  ProBNP (last 3 results) No results for input(s): PROBNP in the last 8760 hours.  CBG: No results for input(s): GLUCAP in the last 168 hours.     Signed:  Domenic Polite MD.  [facility] 12/15/2017, 11:20 AM

## 2017-12-15 NOTE — Progress Notes (Signed)
Patient will DC to: [facility] Anticipated DC date: 12/15/17 Family notified: [name] (attempted) Transport by: Corey Harold  Per MD patient ready for DC to [facility]. RN, patient, patient's family, and facility notified of DC. Discharge Summary sent to facility. RN given number for report [phone] Room 3243. DC packet on chart. Ambulance transport requested for patient for 1:30 pm.  CSW signing off.  [place], [place]

## 2017-12-15 NOTE — Evaluation (Signed)
Occupational Therapy Evaluation Patient Details Name: Shari Horn MRN: [id number] DOB: 04-Aug-1940 Today's Date: 12/15/2017    History of Present Illness 77 y.o. female with medical history significant of hypothyroidism, hld, bipolar  Was on her way to the movies, stumbled and fell down on the left side. She was found to have left hip fracture and underwent left hip arthroplasty on 12/12/2017.   Clinical Impression   PTA, pt was living alone and was independent. Currently, pt requires Min A for LB ADLs and functional mobility using RW; pt requiring Max cues throughout ADLs and mobility due to poor problem solving and awareness. Pt presenting with decreased balance, strength, and activity tolerance. Pt would benefit from further acute OT to facilitate safe dc. Recommend dc to SNF for further OT to optimize safety, independence with ADLs, and return to PLOF.      Follow Up Recommendations  SNF;Supervision/Assistance - 24 hour    Equipment Recommendations  Other (comment)(Defer to next venue)    Recommendations for Other Services PT consult     Precautions / Restrictions Precautions Precautions: Fall Precaution Comments: hx of falls  Restrictions Weight Bearing Restrictions: Yes LLE Weight Bearing: Weight bearing as tolerated      Mobility Bed Mobility Overal bed mobility: Needs Assistance Bed Mobility: Supine to Sit     Supine to sit: Min guard     General bed mobility comments: Min Guard for safety. Increased time and effort  Transfers Overall transfer level: Needs assistance Equipment used: Rolling walker (2 wheeled) Transfers: Sit to/from Stand Sit to Stand: Min guard         General transfer comment: Min Guard A for safety. Pt requiring cues for hand placement and weight shift    Balance Overall balance assessment: Needs assistance Sitting-balance support: Feet supported;No upper extremity supported Sitting balance-Leahy Scale: Good     Standing balance  support: Bilateral upper extremity supported Standing balance-Leahy Scale: Poor Standing balance comment: requires RW assist to maintain balance                           ADL either performed or assessed with clinical judgement   ADL Overall ADL's : Needs assistance/impaired Eating/Feeding: Independent;Sitting   Grooming: Oral care;Wash/dry face;Brushing hair;Min guard;Standing   Upper Body Bathing: Set up;Supervision/ safety;Sitting   Lower Body Bathing: Minimal assistance;Sit to/from stand   Upper Body Dressing : Supervision/safety;Set up;Sitting   Lower Body Dressing: Minimal assistance;Sit to/from stand   Toilet Transfer: Minimal assistance;Ambulation;RW(simulated at recliner)           Functional mobility during ADLs: Minimal assistance;Rolling walker General ADL Comments: Pt requiring Min A for RW management and balance. Pt presenting with poor body awareness and requiring increased cues for sequencing of steps during functional mobility.      Vision         Perception     Praxis      Pertinent Vitals/Pain Pain Assessment: Faces Faces Pain Scale: Hurts even more Pain Location: L hip with weight bearing Pain Descriptors / Indicators: Grimacing;Guarding Pain Intervention(s): Monitored during session;Limited activity within patient's tolerance;Repositioned     Hand Dominance     Extremity/Trunk Assessment Upper Extremity Assessment Upper Extremity Assessment: Overall WFL for tasks assessed   Lower Extremity Assessment Lower Extremity Assessment: Defer to PT evaluation;RLE deficits/detail RLE Deficits / Details: AROM and Strength WFL LLE Deficits / Details: s/p direct anterior THA, Hip flexion limited due to pain, knee and ankle ROM WFL,  strength grossly assessed in knee and ankle 4/5  LLE: Unable to fully assess due to pain   Cervical / Trunk Assessment Cervical / Trunk Assessment: Normal   Communication Communication Communication: No  difficulties   Cognition Arousal/Alertness: Awake/alert Behavior During Therapy: WFL for tasks assessed/performed Overall Cognitive Status: Within Functional Limits for tasks assessed Area of Impairment: Safety/judgement;Following commands;Problem solving                       Following Commands: Follows one step commands with increased time;Follows one step commands inconsistently Safety/Judgement: Decreased awareness of safety   Problem Solving: Slow processing;Requires verbal cues General Comments: Pt requiring increased time and cues. Presenting with decreased problem solving and safety awareness.   General Comments  Increased edema at RLE    Exercises     Shoulder Instructions      Home Living Family/patient expects to be discharged to:: Private residence Living Arrangements: Alone Available Help at Discharge: Family;Available 24 hours/day Type of Home: House Home Access: Stairs to enter [employer] of Steps: 4 Entrance Stairs-Rails: Right Home Layout: Two level;Able to live on main level with bedroom/bathroom Alternate Level Stairs-Number of Steps: 12   Bathroom Shower/Tub: Teacher, early years/pre: Handicapped height Bathroom Accessibility: Yes   Home Equipment: [place] - single point;Grab bars - tub/shower          Prior Functioning/Environment Level of Independence: Independent        Comments: ADLs, IADLs, drives, and enjoys reading and art        OT Problem List: Decreased strength;Decreased range of motion;Decreased activity tolerance;Impaired balance (sitting and/or standing);Decreased knowledge of use of DME or AE;Decreased knowledge of precautions;Decreased safety awareness;Pain;Increased edema      OT Treatment/Interventions: Self-care/ADL training;Therapeutic exercise;Energy conservation;DME and/or AE instruction;Therapeutic activities;Patient/family education    OT Goals(Current goals can be found in the care plan  section) Acute Rehab OT Goals Patient Stated Goal: go home OT Goal Formulation: With patient Time For Goal Achievement: 12/29/17 Potential to Achieve Goals: Good ADL Goals Pt Will Perform Lower Body Dressing: with modified independence;sit to/from stand Pt Will Transfer to Toilet: with modified independence;bedside commode;ambulating Pt Will Perform Toileting - Clothing Manipulation and hygiene: with modified independence;sit to/from stand  OT Frequency: Min 2X/week   Barriers to D/C: Decreased caregiver support  Lives alone       Co-evaluation              AM-PAC OT "6 Clicks" Daily Activity     Outcome Measure Help from another person eating meals?: None Help from another person taking care of personal grooming?: A Little Help from another person toileting, which includes using toliet, bedpan, or urinal?: A Little Help from another person bathing (including washing, rinsing, drying)?: A Little Help from another person to put on and taking off regular upper body clothing?: None Help from another person to put on and taking off regular lower body clothing?: A Little 6 Click Score: 20   End of Session Equipment Utilized During Treatment: Gait belt;Rolling walker Nurse Communication: Mobility status  Activity Tolerance: Patient tolerated treatment well;Patient limited by pain Patient left: in chair;with call bell/phone within reach  OT Visit Diagnosis: Unsteadiness on feet (R26.81);Other abnormalities of gait and mobility (R26.89);Muscle weakness (generalized) (M62.81);Pain Pain - Right/Left: Right Pain - part of body: Leg                Time: 2440-1027 OT Time Calculation (min): 23 min Charges:  OT  General Charges $OT Visit: 1 Visit OT Evaluation $OT Eval Moderate Complexity: 1 Mod OT Treatments $Self Care/Home Management : 8-22 mins  Grace Haggart MSOT, OTR/L Acute Rehab Pager: [phone] Office: [place] 12/15/2017, 1:44 PM

## 2017-12-20 ENCOUNTER — Other Ambulatory Visit: Payer: Self-pay

## 2017-12-29 ENCOUNTER — Other Ambulatory Visit: Payer: Self-pay | Admitting: Family Medicine

## 2018-01-06 ENCOUNTER — Telehealth: Payer: Self-pay | Admitting: Family Medicine

## 2018-01-06 NOTE — Telephone Encounter (Signed)
Copied from [place] [id number]. Topic: General - Other >> Jan 06, 2018  2:51 PM Margot Ables wrote: Caller/Agency: Darlina Guys with [facility] Number: [phone], secure VM if not available Requesting OT/PT/Skilled Nursing/Social Work: PT Frequency: calling to notify Dr. Jerline Pain they will be able to start home health PT services on Saturday 01/08/18. No call back needed.

## 2018-01-06 NOTE — Telephone Encounter (Signed)
Noted  

## 2018-01-06 NOTE — Telephone Encounter (Signed)
See note

## 2018-01-10 ENCOUNTER — Telehealth: Payer: Self-pay | Admitting: Family Medicine

## 2018-01-10 NOTE — Telephone Encounter (Signed)
Copied from [place] [id number]. Topic: Quick Communication - Home Health Verbal Orders >> Jan 10, 2018 10:16 AM Alfredia Ferguson R wrote: Caller/Agency: Luis Abed at [facility] Number: [phone] ask to speak with Melissa Requesting OT/PT/Skilled Nursing/Social Work: Skilled Nursing after care for fracture hip after fall Frequency: 1 WEEK 2 , 2 WEEK 1

## 2018-01-10 NOTE — Telephone Encounter (Signed)
Notified Melissa and gave an ok for verbal orders.

## 2018-01-10 NOTE — Telephone Encounter (Signed)
See note

## 2018-01-14 DIAGNOSIS — Z7982 Long term (current) use of aspirin: Secondary | ICD-10-CM

## 2018-01-14 DIAGNOSIS — D500 Iron deficiency anemia secondary to blood loss (chronic): Secondary | ICD-10-CM

## 2018-01-14 DIAGNOSIS — Z96642 Presence of left artificial hip joint: Secondary | ICD-10-CM

## 2018-01-14 DIAGNOSIS — Z9181 History of falling: Secondary | ICD-10-CM

## 2018-01-14 DIAGNOSIS — M47816 Spondylosis without myelopathy or radiculopathy, lumbar region: Secondary | ICD-10-CM

## 2018-01-14 DIAGNOSIS — F3181 Bipolar II disorder: Secondary | ICD-10-CM | POA: Diagnosis not present

## 2018-01-14 DIAGNOSIS — E785 Hyperlipidemia, unspecified: Secondary | ICD-10-CM

## 2018-01-14 DIAGNOSIS — E039 Hypothyroidism, unspecified: Secondary | ICD-10-CM

## 2018-01-14 DIAGNOSIS — I10 Essential (primary) hypertension: Secondary | ICD-10-CM

## 2018-01-14 DIAGNOSIS — I700 Atherosclerosis of aorta: Secondary | ICD-10-CM

## 2018-01-14 DIAGNOSIS — M8580 Other specified disorders of bone density and structure, unspecified site: Secondary | ICD-10-CM

## 2018-01-14 DIAGNOSIS — M4186 Other forms of scoliosis, lumbar region: Secondary | ICD-10-CM

## 2018-01-14 DIAGNOSIS — K219 Gastro-esophageal reflux disease without esophagitis: Secondary | ICD-10-CM

## 2018-01-14 DIAGNOSIS — S72002D Fracture of unspecified part of neck of left femur, subsequent encounter for closed fracture with routine healing: Secondary | ICD-10-CM | POA: Diagnosis not present

## 2018-01-28 ENCOUNTER — Telehealth: Payer: Self-pay

## 2018-01-28 NOTE — Telephone Encounter (Signed)
Could you please call and schedule this patient for a follow up visit with Dr. Jerline Pain?  She fell and broke her hip in November, and he would like to see her to follow up and see how she is doing (it is recommended she follow up with her PCP).  He really would like to see her before 02/08/2018 if possible.  Thank you!

## 2018-02-04 ENCOUNTER — Ambulatory Visit: Payer: Self-pay | Admitting: Family Medicine

## 2018-02-07 ENCOUNTER — Ambulatory Visit (INDEPENDENT_AMBULATORY_CARE_PROVIDER_SITE_OTHER): Payer: Medicare Other | Admitting: Family Medicine

## 2018-02-07 ENCOUNTER — Encounter: Payer: Self-pay | Admitting: Family Medicine

## 2018-02-07 VITALS — BP 130/76 | HR 71 | Temp 98.3°F | Ht 67.0 in | Wt 179.6 lb

## 2018-02-07 DIAGNOSIS — E785 Hyperlipidemia, unspecified: Secondary | ICD-10-CM | POA: Diagnosis not present

## 2018-02-07 DIAGNOSIS — R739 Hyperglycemia, unspecified: Secondary | ICD-10-CM | POA: Diagnosis not present

## 2018-02-07 DIAGNOSIS — E038 Other specified hypothyroidism: Secondary | ICD-10-CM | POA: Diagnosis not present

## 2018-02-07 DIAGNOSIS — K219 Gastro-esophageal reflux disease without esophagitis: Secondary | ICD-10-CM | POA: Diagnosis not present

## 2018-02-07 DIAGNOSIS — F3181 Bipolar II disorder: Secondary | ICD-10-CM | POA: Diagnosis not present

## 2018-02-07 DIAGNOSIS — I779 Disorder of arteries and arterioles, unspecified: Secondary | ICD-10-CM

## 2018-02-07 DIAGNOSIS — S72002A Fracture of unspecified part of neck of left femur, initial encounter for closed fracture: Secondary | ICD-10-CM | POA: Diagnosis not present

## 2018-02-07 DIAGNOSIS — I739 Peripheral vascular disease, unspecified: Secondary | ICD-10-CM

## 2018-02-07 LAB — COMPREHENSIVE METABOLIC PANEL
ALT: 23 U/L (ref 0–35)
AST: 30 U/L (ref 0–37)
Albumin: 4.5 g/dL (ref 3.5–5.2)
Alkaline Phosphatase: 70 U/L (ref 39–117)
BUN: 14 mg/dL (ref 6–23)
CHLORIDE: 106 meq/L (ref 96–112)
CO2: 25 mEq/L (ref 19–32)
Calcium: 9.6 mg/dL (ref 8.4–10.5)
Creatinine, Ser: 0.92 mg/dL (ref 0.40–1.20)
GFR: 59.01 mL/min — ABNORMAL LOW (ref >60.00)
GLUCOSE: 89 mg/dL (ref 70–99)
POTASSIUM: 4.6 meq/L (ref 3.5–5.1)
SODIUM: 139 meq/L (ref 135–145)
Total Bilirubin: 0.4 mg/dL (ref 0.2–1.2)
Total Protein: 6.8 g/dL (ref 6.0–8.3)

## 2018-02-07 LAB — LIPID PANEL
Cholesterol: 91 mg/dL (ref 0–200)
HDL: 35.4 mg/dL — ABNORMAL LOW (ref >39.00)
LDL CALC: 30 mg/dL (ref 0–99)
NONHDL: 55.84
Total CHOL/HDL Ratio: 3
Triglycerides: 131 mg/dL (ref 0.0–149.0)
VLDL: 26.2 mg/dL (ref 0.0–40.0)

## 2018-02-07 LAB — HEMOGLOBIN A1C: Hgb A1c MFr Bld: 5.3 % (ref 4.6–6.5)

## 2018-02-07 LAB — CBC
HEMATOCRIT: 38 % (ref 36.0–46.0)
Hemoglobin: 12.7 g/dL (ref 12.0–15.0)
MCHC: 33.4 g/dL (ref 30.0–36.0)
MCV: 94.8 fl (ref 78.0–100.0)
Platelets: 243 10*3/uL (ref 150.0–400.0)
RBC: 4.01 Mil/uL (ref 3.87–5.11)
RDW: 14.3 % (ref 11.5–15.5)
WBC: 7 10*3/uL (ref 4.0–10.5)

## 2018-02-07 LAB — TSH: TSH: 2.1 u[IU]/mL (ref 0.35–4.50)

## 2018-02-07 MED ORDER — CLONAZEPAM 0.5 MG PO TABS: 0.5000 mg | ORAL_TABLET | Freq: Every day | ORAL | 0 refills | Status: DC

## 2018-02-07 MED ORDER — LEVOTHYROXINE SODIUM 75 MCG PO TABS: 75.0000 ug | ORAL_TABLET | Freq: Every day | ORAL | 0 refills | Status: DC

## 2018-02-07 NOTE — Progress Notes (Signed)
   Subjective:  Shari Horn is a 78 y.o. female who presents today with a chief complaint of hip fracture follow up  HPI:  Hip Fracture Patient unfortunately suggered hip fracture about 2 months ago while at a movie theater. Was admitted and underwent total hip replacement. She was then sent to rehab and was discharged home about a month. She has been working home health PT/OT which has done well.  She has done much better over the last couple weeks.  She still uses a cane, however is noticed that she has become less reliant on it.  She does not want to start any prescription therapy for osteoporosis and does not take any vitamin D or calcium supplements either.  Her stable, chronic medical conditions are outlined below:  # Hypothyroidism -Currently on synthroid 75 mcg daily and tolerating well.  # Dyslipidemia -Currently on Crestor 40 mg daily and zetia 10mg  daily. Doing well with these.  -No myalgias.  # GERD - Currently on protonix 40mg  daily  # Osteoporosis s/p hip fracture with arthoplasty 11/2016 - Declined pharmacotherpay - Recommend calcium 1200mg  daily and vitmain D 800 IU daily  # Anxiety / Bipolar 2 -Follows with psychiatry. -Currently on clonazepam 0.5 mg daily, Prozac 60 mg daily, Seroquel 300 mg nightly, and Lamictal 100 mg daily.  ROS: Per HPI  PMH: She reports that she quit smoking about 45 years ago. Her smoking use included cigarettes. She quit after 10.00 years of use. She has never used smokeless tobacco. She reports that she does not drink alcohol or use drugs.  Objective:  Physical Exam: BP 130/76 (BP Location: Left Arm, Patient Position: Sitting, Cuff Size: Normal)   Pulse 71   Temp 98.3 F (36.8 C) (Oral)   Ht 5\' 7"  (1.702 m)   Wt 179 lb 9.6 oz (81.5 kg)   LMP  (LMP Unknown)   SpO2 97%   BMI 28.13 kg/m   Gen: NAD, resting comfortably CV: RRR with no murmurs appreciated Pulm: NWOB, CTAB with no crackles, wheezes, or rhonchi GI: Normal bowel  sounds present. Soft, Nontender, Nondistended. MSK: Surgical scar on left hip healing well without any signs of infection. Skin: Warm, dry Neuro: Grossly normal, moves all extremities Psych: Normal affect and thought content  Assessment/Plan:  Hypothyroidism Continue Synthroid 75 mcg daily.  Check TSH today.  Gastroesophageal reflux disease without esophagitis Continue Protonix 40 mg daily.  Dyslipidemia Continue zetia 10mg  daily and crestor 40mg  daily. Check lipid panel.   Closed displaced fracture of left femoral neck (HCC) Doing much better.  Still has to use assistive devices.  Will place referral for home health physical therapy.  Discussed bisphosphonate therapy however patient declined.  She also declined Prolia.  Recommended at least 1200 mg of calcium daily and 800 international units of vitamin D.  Bipolar 2 disorder (HCC) No signs of mania.  Continue Klonopin 0.5 mg daily, Prozac 60 mg daily, Lamictal 100 mg daily, and Seroquel 300 mg nightly.  Bilateral carotid artery disease Stable.  Continue Crestor 40 mg daily.  Check lipid panel.  Check CBC and CMET.   Algis Greenhouse. Jerline Pain, MD 02/07/2018 3:48 PM

## 2018-02-07 NOTE — Patient Instructions (Addendum)
It was very nice to see you today!  I am glad that you are doing better.  Please make sure that you are taking 800 international units of vitamin D and 1200 mg of calcium daily.  We will check blood work today.  Come back to see me in 6 months, or sooner as needed.  Take care, Dr Jerline Pain

## 2018-02-07 NOTE — Assessment & Plan Note (Signed)
Stable.  Continue Crestor 40 mg daily.  Check lipid panel.  Check CBC and CMET.

## 2018-02-07 NOTE — Assessment & Plan Note (Signed)
Continue Synthroid 75 mcg daily.  Check TSH today.

## 2018-02-07 NOTE — Assessment & Plan Note (Signed)
No signs of mania.  Continue Klonopin 0.5 mg daily, Prozac 60 mg daily, Lamictal 100 mg daily, and Seroquel 300 mg nightly.

## 2018-02-07 NOTE — Assessment & Plan Note (Signed)
Continue Protonix 40 mg daily

## 2018-02-07 NOTE — Assessment & Plan Note (Addendum)
Doing much better.  Still has to use assistive devices.  Will place referral for home health physical therapy.  Discussed bisphosphonate therapy however patient declined.  She also declined Prolia.  Recommended at least 1200 mg of calcium daily and 800 international units of vitamin D.

## 2018-02-07 NOTE — Assessment & Plan Note (Signed)
Continue zetia 10mg  daily and crestor 40mg  daily. Check lipid panel.

## 2018-02-08 NOTE — Progress Notes (Signed)
Please inform patient of the following:  Her "good" cholesterol is just a bit low but all of her other blood work is all NORMAL including her thyroid levels.  Do not need to make any changes to her treatment plan at this time.   Will see her back in 6 months for her follow up visit.  Algis Greenhouse. Jerline Pain, MD 02/08/2018 8:06 AM

## 2018-02-11 ENCOUNTER — Telehealth: Payer: Self-pay | Admitting: Family Medicine

## 2018-02-11 NOTE — Telephone Encounter (Signed)
Copied from [place] [id number]. Topic: Quick Communication - Home Health Verbal Orders >> Feb 11, 2018 11:49 AM Alfredia Ferguson R wrote: Caller/Agency: [facility] Number: [phone] Requesting OT/PT/Skilled Nursing/Social Work: PT Frequency: 2 WEEK 2 , 1 WEEK 1 starting 02/14/2018

## 2018-02-11 NOTE — Telephone Encounter (Signed)
Verbal orders given  

## 2018-02-11 NOTE — Telephone Encounter (Signed)
See note

## 2018-02-12 ENCOUNTER — Other Ambulatory Visit: Payer: Self-pay | Admitting: Cardiovascular Disease

## 2018-02-13 ENCOUNTER — Other Ambulatory Visit: Payer: Self-pay | Admitting: Cardiovascular Disease

## 2018-02-14 ENCOUNTER — Other Ambulatory Visit: Payer: Self-pay | Admitting: Cardiovascular Disease

## 2018-02-14 ENCOUNTER — Telehealth: Payer: Self-pay | Admitting: *Deleted

## 2018-02-14 NOTE — Telephone Encounter (Signed)
Left message for patient to call and schedule an over due appointment with Dr. Debara Pickett

## 2018-02-14 NOTE — Telephone Encounter (Signed)
Pt needs an appointment for refills please review.

## 2018-02-17 ENCOUNTER — Other Ambulatory Visit: Payer: Self-pay | Admitting: Family Medicine

## 2018-02-19 DIAGNOSIS — Z9181 History of falling: Secondary | ICD-10-CM

## 2018-02-19 DIAGNOSIS — F3181 Bipolar II disorder: Secondary | ICD-10-CM | POA: Diagnosis not present

## 2018-02-19 DIAGNOSIS — Z7982 Long term (current) use of aspirin: Secondary | ICD-10-CM

## 2018-02-19 DIAGNOSIS — Z96642 Presence of left artificial hip joint: Secondary | ICD-10-CM | POA: Diagnosis not present

## 2018-02-19 DIAGNOSIS — M80052D Age-related osteoporosis with current pathological fracture, left femur, subsequent encounter for fracture with routine healing: Secondary | ICD-10-CM | POA: Diagnosis not present

## 2018-02-19 DIAGNOSIS — I6523 Occlusion and stenosis of bilateral carotid arteries: Secondary | ICD-10-CM

## 2018-02-19 DIAGNOSIS — F419 Anxiety disorder, unspecified: Secondary | ICD-10-CM

## 2018-02-19 DIAGNOSIS — E039 Hypothyroidism, unspecified: Secondary | ICD-10-CM

## 2018-03-01 ENCOUNTER — Telehealth: Payer: Self-pay | Admitting: Family Medicine

## 2018-03-01 NOTE — Telephone Encounter (Signed)
LM with verbal orders

## 2018-03-01 NOTE — Telephone Encounter (Signed)
See note

## 2018-03-01 NOTE — Telephone Encounter (Signed)
Copied from [place] [id number]. Topic: Quick Communication - Home Health Verbal Orders >> Mar 01, 2018 12:51 PM Yvette Rack wrote: Caller/Agency: Monique with [facility] Number: [phone] Requesting OT/PT/Skilled Nursing/Social Work: PT  Frequency: extend PT for 2 times a week for 4 weeks for strengthening, gate balance training, home exercise progression, and home safety

## 2018-03-13 LAB — HM DEXA SCAN: HM Dexa Scan: -1.1

## 2018-03-19 ENCOUNTER — Other Ambulatory Visit: Payer: Self-pay | Admitting: Internal Medicine

## 2018-03-24 ENCOUNTER — Encounter: Payer: Self-pay | Admitting: Internal Medicine

## 2018-03-24 ENCOUNTER — Ambulatory Visit: Payer: Medicare Other | Admitting: Internal Medicine

## 2018-03-24 VITALS — BP 112/60 | HR 76 | Ht 67.0 in | Wt 173.0 lb

## 2018-03-24 DIAGNOSIS — I739 Peripheral vascular disease, unspecified: Secondary | ICD-10-CM

## 2018-03-24 DIAGNOSIS — I779 Disorder of arteries and arterioles, unspecified: Secondary | ICD-10-CM

## 2018-03-24 DIAGNOSIS — E785 Hyperlipidemia, unspecified: Secondary | ICD-10-CM

## 2018-03-24 NOTE — Patient Instructions (Signed)
Medication Instructions:  Your Physician recommend you continue on your current medication as directed.    If you need a refill on your cardiac medications before your next appointment, please call your pharmacy.   Lab work: None  Testing/Procedures: None  Follow-Up: At [facility], you and your health needs are our priority.  As part of our continuing mission to provide you with exceptional heart care, we have created designated Provider Care Teams.  These Care Teams include your primary Cardiologist (physician) and Advanced Practice Providers (APPs -  Physician Assistants and Nurse Practitioners) who all work together to provide you with the care you need, when you need it. You will need a follow up appointment in 1 years.  Please call our office 2 months in advance to schedule this appointment.  You may see Kenneth C Hilty, MD or one of the following Advanced Practice Providers on your designated Care Team: Hao Meng, PA-C . Angela Duke, PA-C     

## 2018-03-24 NOTE — Progress Notes (Signed)
OFFICE NOTE  Chief Complaint:  No complaints  Primary Care Physician: Vivi Barrack, MD  HPI:  Shari Horn is a 78 year old female who was referred to me for irregular heartbeat and PVCs, as well as an abnormal screening carotid ultrasound. We repeated that carotid Doppler which indicates increased velocity on the left internal carotid up to 206 cm/second in the posterior part of the internal carotid artery consistent with 50-69% left internal carotid artery stenosis. There was a mild amount of plaque in the right carotid, 0-49% stenosis, neither of which meets criteria for intervention at this time. In addition we performed an echocardiogram in the office, indicating an EF of 123456, mild diastolic dysfunction. There was trivial mitral regurgitation and aortic regurgitation, but no significant stenosis. I reviewed the results with her today and feel like she is doing quite well but would recommend a repeat lipid profile and more aggressive lipid management as necessary. I have copied you on that laboratory work so that you may review it, and we will plan to see her back annually with repeat carotid Dopplers at that time. She, in fact, was due to go over repeat dopplers today, but has not had them performed.  She was having palpitations, PVCs which are not as worrisome given the fact that she has normal LV function and no signs or symptoms of ischemia. I recommended decreasing her caffeine intake which may be difficult to do as she is reportedly self-addicted, but she does drink about 1 small pot, or 4 cups of coffee every day and that certainly can contribute significantly to her PVCs. She now reports the palpitations have improved.  Finally, she recently had cholesterol testing through her primary care provider. Her total cholesterol was 233, triglycerides 183, HDL 41 LDL 156. Based on this for right torn was increased from 10/20 mg to 10/40 mg. Her goal LDL, however is less than 70, which means  she needs an additional 50% reduction in her cholesterol. I'm not sure that this is achievable with an increase in her simvastatin by 20 mg.  I saw Shari Horn back in the office today. She recently was seen in emergency department for chest discomfort which was after grieving for friend who had committed suicide. She feels that this was all related to the grieving and cardiovascular workup was negative. In November she had carotid Dopplers which show stable carotid stenoses. Her particle numbers remain elevated with LDL-P of 1795, despite being on Crestor 20 and Zetia 10 mg. I increased her Crestor to 40 mg daily. She's had very high LDLs in the past and significant coronary stenoses. 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  Shari Horn returns today for follow-up. Overall she feels well. At her last cholesterol check about a year ago she had excellent control of her cholesterol with an LDL particle number just over thousand. She apparently had recheck by her primary care provider this fall. She also had repeat carotid Dopplers which shows stable moderate carotid stenoses. She denies any side effects from her cholesterol medicine. She tells me that she's planning a trip to the [place] and significant altitude and is interested in medication for that. I'm happy to provide her acetazolamide.  03/24/2018  Shari Horn is seen today in follow-up.  Over the past year she is done well.  She has had marked improvement in her lipid profile.  In fact now her most recent labs showed a total cholesterol of 91 and LDL  of 30.  I think there is been significant dietary changes it may have been responsible for this.  Because of her history of carotid artery disease, I am hesitant to back off on her medications.  In fact the lower LDL likely the better for her.  She is otherwise asymptomatic.  In the fall she did unfortunately have a hip fracture and had to have surgery for that.  But otherwise she is doing  well.  PMHx:  Past Medical History:  Diagnosis Date  . Carotid artery disease ([place])   . Depression   . Dyslipidemia   . Hyperlipidemia   . Hypothyroidism   . 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 PVC's (premature ventricular contractions)     Past Surgical History:  Procedure Laterality Date  . BREAST BIOPSY    . BREAST EXCISIONAL BIOPSY Left 1990  . BREAST EXCISIONAL BIOPSY Left 1996  . Carotid Doppler  12/2011   0-49% RICA stenosis, 09-73% LICA stenosis  . TOTAL HIP ARTHROPLASTY Left 12/12/2017   Procedure: TOTAL HIP ARTHROPLASTY ANTERIOR APPROACH;  Surgeon: Rod Can, MD;  Location: [facility];  Service: Orthopedics;  Laterality: Left;  . TRANSTHORACIC ECHOCARDIOGRAM  12/2011   EF 53-29%, grade 1 diastolic dysfunction; trivial AV regurg; calcified MV annulus    FAMHx:  Family History  Problem Relation Age of Onset  . Heart failure Mother   . Thyroid disease Mother   . Hypertension Mother   . Diabetes Mother   . Arthritis/Rheumatoid Father   . Heart Problems Maternal Grandmother   . Heart disease Brother        valve replacement  . Heart disease Child   . Colon cancer Neg Hx   . Esophageal cancer Neg Hx   . Stomach cancer Neg Hx     SOCHx:   reports that she quit smoking about 45 years ago. Her smoking use included cigarettes. She quit after 10.00 years of use. She has never used smokeless tobacco. She reports that she does not drink alcohol or use drugs.  ALLERGIES:  Allergies  Allergen Reactions  . Codeine Nausea And Vomiting and Nausea Only    Other reaction(s): Unknown    ROS: A comprehensive review of systems was negative.  HOME MEDS: Current Outpatient Medications  Medication Sig Dispense Refill  . clonazePAM (KLONOPIN) 0.5 MG tablet Take 1 tablet (0.5 mg total) by mouth daily. 5 tablet 0  . ezetimibe (ZETIA) 10 MG tablet TAKE 1 TABLET BY MOUTH DAILY. SCHEDULE OFFICE VISIT FOR FURTHER REFILLS 90 tablet 1  . FLUoxetine (PROZAC) 20 MG tablet Take 60 mg by  mouth daily.    Marland Kitchen HYDROcodone-acetaminophen (NORCO) 5-325 MG tablet Take 1-2 tablets by mouth every 6 (six) hours as needed for moderate pain. 40 tablet 0  . lamoTRIgine (LAMICTAL) 100 MG tablet Take 100 mg by mouth daily.    Marland Kitchen levothyroxine (SYNTHROID, LEVOTHROID) 75 MCG tablet TAKE 1 TABLET(75 MCG) BY MOUTH DAILY 90 tablet 0  . meclizine (ANTIVERT) 25 MG tablet Take 1 tablet (25 mg total) by mouth 3 (three) times daily as needed for dizziness. 30 tablet 0  . pantoprazole (PROTONIX) 40 MG tablet Take 1 tablet (40 mg total) by mouth daily. 90 tablet 3  . QUEtiapine (SEROQUEL) 300 MG tablet Take 300 mg by mouth at bedtime.     . rosuvastatin (CRESTOR) 40 MG tablet TAKE 1 TABLET BY MOUTH DAILY 90 tablet 1  . senna (SENOKOT) 8.6 MG TABS tablet Take 5 tablets by  mouth at bedtime.      No current facility-administered medications for this visit.     LABS/IMAGING: No results found for this or any previous visit (from the past 48 hour(s)). No results found.  VITALS: BP 112/60   Pulse 76   Ht 5\' 7"  (1.702 m)   Wt 173 lb (78.5 kg)   LMP  (LMP Unknown)   BMI 27.10 kg/m   EXAM: General appearance: alert and no distress Neck: no carotid bruit, no JVD and thyroid not enlarged, symmetric, no tenderness/mass/nodules Lungs: clear to auscultation bilaterally Heart: regular rate and rhythm, S1, S2 normal, no murmur, click, rub or gallop Abdomen: soft, non-tender; bowel sounds normal; no masses,  no organomegaly Extremities: extremities normal, atraumatic, no cyanosis or edema Pulses: 2+ and symmetric Skin: Skin color, texture, turgor normal. No rashes or lesions Neurologic: Grossly normal Psych: Pleasant  EKG: Sinus rhythm 76-personally reviewed  ASSESSMENT: 1. Bilateral carotid artery disease - mild 2. 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 Palpitations-resolved 4. PVCs-resolved  PLAN: 1.   Shari Horn continues to do well and has mild bilateral carotid artery disease.  Her goal  LDL is less than 70 now she is much lower than that at 30.  Although 1 could argue backing off on her medications, I think there is clear data that aggressive therapy is probably more beneficial in slowing the progression of her carotid disease.  She denies any recurrent palpitations.  No changes were made to her medicines today.  Follow-up annually or sooner as necessary.  Pixie Casino, MD, [facility], [address] Director of the Advanced Lipid Disorders &  Cardiovascular Risk Reduction Clinic Diplomate of the American Board of Clinical Lipidology Attending Cardiologist  Direct Dial: [phone]  Fax: [phone]  Website:  [email or web address]   Nadean Corwin Livan Hires 03/24/2018, 2:13 PM

## 2018-05-10 ENCOUNTER — Encounter: Payer: Self-pay | Admitting: Family Medicine

## 2018-05-18 ENCOUNTER — Other Ambulatory Visit: Payer: Self-pay | Admitting: Family Medicine

## 2018-08-03 ENCOUNTER — Other Ambulatory Visit: Payer: Self-pay | Admitting: Internal Medicine

## 2018-08-23 ENCOUNTER — Other Ambulatory Visit: Payer: Self-pay | Admitting: Family Medicine

## 2018-08-28 ENCOUNTER — Other Ambulatory Visit: Payer: Self-pay | Admitting: Family Medicine

## 2018-08-31 NOTE — Telephone Encounter (Signed)
Refill Request.  

## 2018-09-05 ENCOUNTER — Telehealth: Payer: Self-pay | Admitting: Adult Health

## 2018-09-05 NOTE — Telephone Encounter (Signed)
Patient need refill on Clonazepam, it was not filled please send to [facility] at [facility] and [address]

## 2018-09-06 ENCOUNTER — Ambulatory Visit (INDEPENDENT_AMBULATORY_CARE_PROVIDER_SITE_OTHER): Payer: Medicare Other | Admitting: Family Medicine

## 2018-09-06 ENCOUNTER — Encounter: Payer: Self-pay | Admitting: Family Medicine

## 2018-09-06 ENCOUNTER — Other Ambulatory Visit: Payer: Self-pay

## 2018-09-06 VITALS — BP 116/64 | HR 73 | Temp 97.9°F | Ht 67.0 in | Wt 174.4 lb

## 2018-09-06 DIAGNOSIS — R2681 Unsteadiness on feet: Secondary | ICD-10-CM | POA: Diagnosis not present

## 2018-09-06 DIAGNOSIS — R6889 Other general symptoms and signs: Secondary | ICD-10-CM

## 2018-09-06 DIAGNOSIS — E663 Overweight: Secondary | ICD-10-CM | POA: Diagnosis not present

## 2018-09-06 DIAGNOSIS — F3181 Bipolar II disorder: Secondary | ICD-10-CM

## 2018-09-06 DIAGNOSIS — Z6827 Body mass index (BMI) 27.0-27.9, adult: Secondary | ICD-10-CM

## 2018-09-06 MED ORDER — CLONAZEPAM 0.5 MG PO TABS: 0.5000 mg | ORAL_TABLET | Freq: Three times a day (TID) | ORAL | 5 refills | Status: DC

## 2018-09-06 NOTE — Assessment & Plan Note (Signed)
Stable.  Database was reviewed with no red flags.  We will take over management of her psychiatric medications as she has been on these for several years and has not had any manic episodes or adverse reaction to medications.  We will continue Klonopin 0.5 mg 3 times daily, Prozac 60 mg daily, Lamictal 100 mg daily, and Seroquel 300 mg nightly.  She will follow-up with me in 3 to 6 months.

## 2018-09-06 NOTE — Assessment & Plan Note (Signed)
2/3 delayed word recall on minicog. She will follow up with neurology.

## 2018-09-06 NOTE — Progress Notes (Signed)
   Chief Complaint:  Shari Horn is a 78 y.o. female who presents today with a chief complaint of bipolar disorder.   Assessment/Plan:  Forgetfulness 2/3 delayed word recall on minicog. She will follow up with neurology.   Unsteady gait Normal neurologic exam today.  Possibly related to hesitancy related to her recent hip fracture.  She will follow-up with neurology as noted above.  Will likely need more PT.  Bipolar 2 disorder (HCC) Stable.  Database was reviewed with no red flags.  We will take over management of her psychiatric medications as she has been on these for several years and has not had any manic episodes or adverse reaction to medications.  We will continue Klonopin 0.5 mg 3 times daily, Prozac 60 mg daily, Lamictal 100 mg daily, and Seroquel 300 mg nightly.  She will follow-up with me in 3 to 6 months.   Body mass index is 27.31 kg/m. / Overweight BMI Metric Follow Up - 09/06/18 1407      BMI Metric Follow Up-Please document annually   BMI Metric Follow Up  Education provided         Subjective:  HPI:  Bipolar Disorder Patient was previously seeing psychiatrist for this.  She has been on stable medications for the past several years.  She is on Klonopin 0.5 mg three times daily, Prozac 60 mg daily, Seroquel 300 mg daily, and Lamictal 100 mg daily.  She has been tolerating all these medications well.  Unfortunately her psychiatrist has recently moved and she is looking for a new provider.  She would like for [place] to take over management of her psychiatric medications.  Patient is also concerned about being off balance and having an unsteady gait since her hip fracture last year.  She has not fallen for the past several months however frequently feels herself falling and has to catch herself.  She also has some concerns about short-term memory loss.  She will frequently find it difficult to find words.  She has been taking Prevagen which seems to be helping.    ROS: Per HPI   PMH: She reports that she quit smoking about 45 years ago. Her smoking use included cigarettes. She quit after 10.00 years of use. She has never used smokeless tobacco. She reports that she does not drink alcohol or use drugs.      Objective:  Physical Exam: BP 116/64 (BP Location: Left Arm, Patient Position: Sitting, Cuff Size: Normal)   Pulse 73   Temp 97.9 F (36.6 C) (Oral)   Ht 5\' 7"  (1.702 m)   Wt 174 lb 6.4 oz (79.1 kg)   LMP  (LMP Unknown)   SpO2 98%   BMI 27.31 kg/m   Gen: NAD, resting comfortably CV: Regular rate and rhythm with no murmurs appreciated Pulm: Normal work of breathing, clear to auscultation bilaterally with no crackles, wheezes, or rhonchi MSK: No edema, cyanosis, or clubbing noted Skin: Warm, dry Neuro: Grossly normal, moves all extremities Psych: Normal affect and thought content  No results found for this or any previous visit (from the past 24 hour(s)).      Algis Greenhouse. Jerline Pain, MD 09/06/2018 2:07 PM

## 2018-09-06 NOTE — Patient Instructions (Signed)
It was very nice to see you today!  I will send in your medications.  Please follow up with neurology soon.  Come back in 6 months, or sooner if needed.   Take care, Dr Jerline Pain  Please try these tips to maintain a healthy lifestyle:   Eat at least 3 REAL meals and 1-2 snacks per day.  Aim for no more than 5 hours between eating.  If you eat breakfast, please do so within one hour of getting up.    Obtain twice as many fruits/vegetables as protein or carbohydrate foods for both lunch and dinner. (Half of each meal should be fruits/vegetables, one quarter protein, and one quarter starchy carbs)   Cut down on sweet beverages. This includes juice, soda, and sweet tea.    Exercise at least 150 minutes every week.

## 2018-09-06 NOTE — Assessment & Plan Note (Signed)
Normal neurologic exam today.  Possibly related to hesitancy related to her recent hip fracture.  She will follow-up with neurology as noted above.  Will likely need more PT.

## 2018-09-08 ENCOUNTER — Ambulatory Visit: Payer: Medicare Other | Admitting: Adult Health

## 2018-09-12 ENCOUNTER — Ambulatory Visit: Payer: Medicare Other | Admitting: Family Medicine

## 2018-10-03 ENCOUNTER — Ambulatory Visit (HOSPITAL_COMMUNITY)
Admission: RE | Admit: 2018-10-03 | Discharge: 2018-10-03 | Disposition: A | Discharge status: Home / Self Care | Payer: Medicare Other | Source: Ambulatory Visit | Attending: Cardiology | Admitting: Cardiology

## 2018-10-03 ENCOUNTER — Other Ambulatory Visit: Payer: Self-pay | Admitting: Internal Medicine

## 2018-10-03 ENCOUNTER — Other Ambulatory Visit: Payer: Self-pay

## 2018-10-03 DIAGNOSIS — I6523 Occlusion and stenosis of bilateral carotid arteries: Secondary | ICD-10-CM

## 2018-10-18 ENCOUNTER — Inpatient Hospital Stay (HOSPITAL_COMMUNITY)
Admission: EM | Admit: 2018-10-18 | Discharge: 2018-10-22 | DRG: 536 | Disposition: A | Discharge status: Skilled Nursing Facility | Payer: Medicare Other | Attending: Internal Medicine | Admitting: Internal Medicine

## 2018-10-18 ENCOUNTER — Emergency Department (HOSPITAL_COMMUNITY): Payer: Medicare Other

## 2018-10-18 ENCOUNTER — Encounter (HOSPITAL_COMMUNITY): Payer: Self-pay

## 2018-10-18 ENCOUNTER — Other Ambulatory Visit: Payer: Self-pay

## 2018-10-18 DIAGNOSIS — R2681 Unsteadiness on feet: Secondary | ICD-10-CM | POA: Diagnosis not present

## 2018-10-18 DIAGNOSIS — F3181 Bipolar II disorder: Secondary | ICD-10-CM | POA: Diagnosis present

## 2018-10-18 DIAGNOSIS — Y92003 Bedroom of unspecified non-institutional (private) residence as the place of occurrence of the external cause: Secondary | ICD-10-CM

## 2018-10-18 DIAGNOSIS — S32592A Other specified fracture of left pubis, initial encounter for closed fracture: Secondary | ICD-10-CM

## 2018-10-18 DIAGNOSIS — Z79899 Other long term (current) drug therapy: Secondary | ICD-10-CM

## 2018-10-18 DIAGNOSIS — R55 Syncope and collapse: Secondary | ICD-10-CM | POA: Diagnosis not present

## 2018-10-18 DIAGNOSIS — Z23 Encounter for immunization: Secondary | ICD-10-CM

## 2018-10-18 DIAGNOSIS — Z96642 Presence of left artificial hip joint: Secondary | ICD-10-CM | POA: Diagnosis present

## 2018-10-18 DIAGNOSIS — E785 Hyperlipidemia, unspecified: Secondary | ICD-10-CM | POA: Diagnosis present

## 2018-10-18 DIAGNOSIS — W19XXXA Unspecified fall, initial encounter: Secondary | ICD-10-CM | POA: Diagnosis present

## 2018-10-18 DIAGNOSIS — E039 Hypothyroidism, unspecified: Secondary | ICD-10-CM | POA: Diagnosis present

## 2018-10-18 DIAGNOSIS — S22089A Unspecified fracture of T11-T12 vertebra, initial encounter for closed fracture: Secondary | ICD-10-CM | POA: Diagnosis present

## 2018-10-18 DIAGNOSIS — I2510 Atherosclerotic heart disease of native coronary artery without angina pectoris: Secondary | ICD-10-CM | POA: Diagnosis present

## 2018-10-18 DIAGNOSIS — K219 Gastro-esophageal reflux disease without esophagitis: Secondary | ICD-10-CM | POA: Diagnosis present

## 2018-10-18 DIAGNOSIS — F419 Anxiety disorder, unspecified: Secondary | ICD-10-CM | POA: Diagnosis present

## 2018-10-18 DIAGNOSIS — R296 Repeated falls: Secondary | ICD-10-CM | POA: Diagnosis present

## 2018-10-18 DIAGNOSIS — R0902 Hypoxemia: Secondary | ICD-10-CM | POA: Diagnosis present

## 2018-10-18 DIAGNOSIS — R918 Other nonspecific abnormal finding of lung field: Secondary | ICD-10-CM | POA: Diagnosis present

## 2018-10-18 DIAGNOSIS — Z87891 Personal history of nicotine dependence: Secondary | ICD-10-CM

## 2018-10-18 DIAGNOSIS — S329XXA Fracture of unspecified parts of lumbosacral spine and pelvis, initial encounter for closed fracture: Secondary | ICD-10-CM | POA: Diagnosis present

## 2018-10-18 DIAGNOSIS — Z79890 Hormone replacement therapy (postmenopausal): Secondary | ICD-10-CM

## 2018-10-18 DIAGNOSIS — Z20828 Contact with and (suspected) exposure to other viral communicable diseases: Secondary | ICD-10-CM | POA: Diagnosis present

## 2018-10-18 DIAGNOSIS — K5900 Constipation, unspecified: Secondary | ICD-10-CM | POA: Diagnosis present

## 2018-10-18 DIAGNOSIS — W1839XA Other fall on same level, initial encounter: Secondary | ICD-10-CM | POA: Diagnosis present

## 2018-10-18 DIAGNOSIS — Y92009 Unspecified place in unspecified non-institutional (private) residence as the place of occurrence of the external cause: Secondary | ICD-10-CM

## 2018-10-18 LAB — CBC WITH DIFFERENTIAL/PLATELET
Abs Immature Granulocytes: 0.03 10*3/uL (ref 0.00–0.07)
Basophils Absolute: 0 10*3/uL (ref 0.0–0.1)
Basophils Relative: 0 %
Eosinophils Absolute: 0.3 10*3/uL (ref 0.0–0.5)
Eosinophils Relative: 3 %
HCT: 33.2 % — ABNORMAL LOW (ref 36.0–46.0)
Hemoglobin: 11 g/dL — ABNORMAL LOW (ref 12.0–15.0)
Immature Granulocytes: 0 %
Lymphocytes Relative: 26 %
Lymphs Abs: 2.1 10*3/uL (ref 0.7–4.0)
MCH: 32.9 pg (ref 26.0–34.0)
MCHC: 33.1 g/dL (ref 30.0–36.0)
MCV: 99.4 fL (ref 80.0–100.0)
Monocytes Absolute: 0.6 10*3/uL (ref 0.1–1.0)
Monocytes Relative: 8 %
Neutro Abs: 5.2 10*3/uL (ref 1.7–7.7)
Neutrophils Relative %: 63 %
Platelets: 196 10*3/uL (ref 150–400)
RBC: 3.34 MIL/uL — ABNORMAL LOW (ref 3.87–5.11)
RDW: 12.7 % (ref 11.5–15.5)
WBC: 8.3 10*3/uL (ref 4.0–10.5)
nRBC: 0 % (ref 0.0–0.2)

## 2018-10-18 LAB — BASIC METABOLIC PANEL
Anion gap: 7 (ref 5–15)
BUN: 21 mg/dL (ref 8–23)
CO2: 27 mmol/L (ref 22–32)
Calcium: 9 mg/dL (ref 8.9–10.3)
Chloride: 104 mmol/L (ref 98–111)
Creatinine, Ser: 0.85 mg/dL (ref 0.44–1.00)
GFR calc Af Amer: >60 mL/min (ref >60)
GFR calc non Af Amer: >60 mL/min (ref >60)
Glucose, Bld: 122 mg/dL — ABNORMAL HIGH (ref 70–99)
Potassium: 3.8 mmol/L (ref 3.5–5.1)
Sodium: 138 mmol/L (ref 135–145)

## 2018-10-18 LAB — SARS CORONAVIRUS 2 BY RT PCR (HOSPITAL ORDER, PERFORMED IN ~~LOC~~ HOSPITAL LAB): SARS Coronavirus 2: NEGATIVE

## 2018-10-18 LAB — D-DIMER, QUANTITATIVE: D-Dimer, Quant: 10.89 ug/mL-FEU — ABNORMAL HIGH (ref 0.00–0.50)

## 2018-10-18 MED ORDER — QUETIAPINE FUMARATE 100 MG PO TABS
300.0000 mg | ORAL_TABLET | Freq: Every day | ORAL | Status: DC
  Administered 2018-10-18 – 2018-10-21 (×4): 300 mg via ORAL
  Filled 2018-10-18 (×5): qty 3
  Filled 2018-10-18: qty 1

## 2018-10-18 MED ORDER — OXYCODONE-ACETAMINOPHEN 5-325 MG PO TABS
1.0000 | ORAL_TABLET | Freq: Once | ORAL | Status: AC
  Administered 2018-10-18: 07:00:00 1 via ORAL
  Filled 2018-10-18: qty 1

## 2018-10-18 MED ORDER — LEVOTHYROXINE SODIUM 75 MCG PO TABS
75.0000 ug | ORAL_TABLET | Freq: Every day | ORAL | Status: DC
  Administered 2018-10-19 – 2018-10-22 (×4): 75 ug via ORAL
  Filled 2018-10-18 (×5): qty 1

## 2018-10-18 MED ORDER — OXYCODONE-ACETAMINOPHEN 5-325 MG PO TABS
1.0000 | ORAL_TABLET | Freq: Four times a day (QID) | ORAL | Status: DC | PRN
  Administered 2018-10-18 – 2018-10-19 (×3): 1 via ORAL
  Filled 2018-10-18 (×4): qty 1

## 2018-10-18 MED ORDER — SENNA 8.6 MG PO TABS
5.0000 | ORAL_TABLET | Freq: Every day | ORAL | Status: DC
  Administered 2018-10-18 – 2018-10-19 (×2): 43 mg via ORAL
  Filled 2018-10-18 (×3): qty 5

## 2018-10-18 MED ORDER — FLUOXETINE HCL 20 MG PO TABS
60.0000 mg | ORAL_TABLET | Freq: Every day | ORAL | Status: DC
  Filled 2018-10-18: qty 3

## 2018-10-18 MED ORDER — CLONAZEPAM 0.5 MG PO TABS: 0.5000 mg | ORAL_TABLET | Freq: Three times a day (TID) | ORAL | Status: DC | PRN

## 2018-10-18 MED ORDER — IOHEXOL 350 MG/ML SOLN
100.0000 mL | Freq: Once | INTRAVENOUS | Status: AC | PRN
  Administered 2018-10-18: 57 mL via INTRAVENOUS

## 2018-10-18 MED ORDER — FLUOXETINE HCL 20 MG PO CAPS
60.0000 mg | ORAL_CAPSULE | Freq: Every day | ORAL | Status: DC
  Administered 2018-10-18 – 2018-10-22 (×5): 60 mg via ORAL
  Filled 2018-10-18 (×5): qty 3

## 2018-10-18 MED ORDER — OXYCODONE-ACETAMINOPHEN 5-325 MG PO TABS
1.0000 | ORAL_TABLET | Freq: Once | ORAL | Status: AC
  Administered 2018-10-18: 11:00:00 1 via ORAL
  Filled 2018-10-18: qty 1

## 2018-10-18 MED ORDER — ROSUVASTATIN CALCIUM 20 MG PO TABS
40.0000 mg | ORAL_TABLET | Freq: Every day | ORAL | Status: DC
  Administered 2018-10-18 – 2018-10-22 (×5): 40 mg via ORAL
  Filled 2018-10-18 (×5): qty 2

## 2018-10-18 MED ORDER — MECLIZINE HCL 25 MG PO TABS
25.0000 mg | ORAL_TABLET | Freq: Three times a day (TID) | ORAL | Status: DC | PRN
  Filled 2018-10-18: qty 1

## 2018-10-18 MED ORDER — ENOXAPARIN SODIUM 40 MG/0.4ML ~~LOC~~ SOLN
40.0000 mg | SUBCUTANEOUS | Status: DC
  Administered 2018-10-18 – 2018-10-21 (×4): 40 mg via SUBCUTANEOUS
  Filled 2018-10-18 (×4): qty 0.4

## 2018-10-18 MED ORDER — LAMOTRIGINE 100 MG PO TABS
100.0000 mg | ORAL_TABLET | Freq: Every day | ORAL | Status: DC
  Administered 2018-10-18 – 2018-10-22 (×5): 100 mg via ORAL
  Filled 2018-10-18 (×5): qty 1

## 2018-10-18 MED ORDER — EZETIMIBE 10 MG PO TABS
10.0000 mg | ORAL_TABLET | Freq: Every day | ORAL | Status: DC
  Administered 2018-10-18 – 2018-10-22 (×5): 10 mg via ORAL
  Filled 2018-10-18 (×6): qty 1

## 2018-10-18 MED ORDER — ACETAMINOPHEN 500 MG PO TABS: 1000.0000 mg | ORAL_TABLET | Freq: Once | ORAL | Status: DC | PRN

## 2018-10-18 NOTE — Evaluation (Signed)
Physical Therapy Evaluation Patient Details Name: Shari Horn MRN: [id number] DOB: Oct 09, 1940 Today's Date: 10/18/2018   History of Present Illness  Pt is a 78 y/o female presenting to the ED following a fall. Found to have L inferior pubic rami fx. PMH includes CAD, bipolar disorder, vertigo, and L THA.  Clinical Impression  Pt presenting secondary to problem above with deficits above. Pt only able to tolerate sitting EOB secondary to pain in L sacrum and groin. Pt requiring mod A to perform bed mobility tasks. Pt currently lives alone and feel she is at increased risk for falls. Feel pt would benefit from SNF at d/c to increase independence prior to return home; pt reports she prefers [facility]. Will continue to follow acutely to maximize functional mobility independence and safety.    Follow Up Recommendations SNF    Equipment Recommendations  None recommended by PT    Recommendations for Other Services       Precautions / Restrictions Precautions Precautions: Fall Restrictions Weight Bearing Restrictions: Yes LLE Weight Bearing: Weight bearing as tolerated Other Position/Activity Restrictions: WBAT per PA      Mobility  Bed Mobility Overal bed mobility: Needs Assistance Bed Mobility: Supine to Sit;Sit to Supine     Supine to sit: Mod assist Sit to supine: Mod assist   General bed mobility comments: Mod A for LE assist and trunk elevation during mobility tasks. Pt very slow to move to EOB secondary to increased pain. Pt requesting to defer further mobility secondary to pain.   Transfers                    Ambulation/Gait                Stairs            Wheelchair Mobility    Modified Rankin (Stroke Patients Only)       Balance Overall balance assessment: Needs assistance Sitting-balance support: Bilateral upper extremity supported;Feet unsupported Sitting balance-Leahy Scale: Fair                                        Pertinent Vitals/Pain Pain Assessment: 0-10 Pain Score: 9  Pain Location: L groin/pelvis area Pain Descriptors / Indicators: Grimacing;Guarding Pain Intervention(s): Monitored during session;Limited activity within patient's tolerance;Repositioned    Home Living Family/patient expects to be discharged to:: Private residence Living Arrangements: Alone Available Help at Discharge: Family;Available PRN/intermittently Type of Home: House Home Access: Stairs to enter Entrance Stairs-Rails: Right Entrance Stairs-Number of Steps: 4 Home Layout: Two level;Able to live on main level with bedroom/bathroom Home Equipment: Cane - single point;Grab bars - tub/shower;Walker - 2 wheels      Prior Function Level of Independence: Independent               Hand Dominance        Extremity/Trunk Assessment   Upper Extremity Assessment Upper Extremity Assessment: Defer to OT evaluation    Lower Extremity Assessment Lower Extremity Assessment: LLE deficits/detail LLE Deficits / Details: Limited ROM secondary to pain. Pt reports being unable to bear weight.     Cervical / Trunk Assessment Cervical / Trunk Assessment: Normal  Communication   Communication: No difficulties  Cognition Arousal/Alertness: Awake/alert Behavior During Therapy: WFL for tasks assessed/performed Overall Cognitive Status: Within Functional Limits for tasks assessed  General Comments      Exercises     Assessment/Plan    PT Assessment Patient needs continued PT services  PT Problem List Decreased strength;Decreased range of motion;Decreased balance;Decreased activity tolerance;Decreased mobility;Decreased knowledge of use of DME;Pain       PT Treatment Interventions DME instruction    PT Goals (Current goals can be found in the Care Plan section)  Acute Rehab PT Goals Patient Stated Goal: to be more independent and decrease pain PT Goal  Formulation: With patient Time For Goal Achievement: 11/01/18 Potential to Achieve Goals: Fair    Frequency Min 2X/week   Barriers to discharge Decreased caregiver support      Co-evaluation               AM-PAC PT "6 Clicks" Mobility  Outcome Measure Help needed turning from your back to your side while in a flat bed without using bedrails?: A Lot Help needed moving from lying on your back to sitting on the side of a flat bed without using bedrails?: A Lot Help needed moving to and from a bed to a chair (including a wheelchair)?: Total Help needed standing up from a chair using your arms (e.g., wheelchair or bedside chair)?: Total Help needed to walk in hospital room?: Total Help needed climbing 3-5 steps with a railing? : Total 6 Click Score: 8    End of Session   Activity Tolerance: Patient limited by pain Patient left: in bed;with call bell/phone within reach Nurse Communication: Mobility status PT Visit Diagnosis: Muscle weakness (generalized) (M62.81);History of falling (Z91.81);Difficulty in walking, not elsewhere classified (R26.2);Pain Pain - Right/Left: Left Pain - part of body: (groin/sacrum)    Time: [id number] PT Time Calculation (min) (ACUTE ONLY): 19 min   Charges:   PT Evaluation $PT Eval Moderate Complexity: [place], PT, DPT  Acute Rehabilitation Services  Pager: [phone] Office: [phone]   Rudean Hitt 10/18/2018, 12:54 PM

## 2018-10-18 NOTE — ED Notes (Signed)
Patient transported to CT 

## 2018-10-18 NOTE — Progress Notes (Signed)
OT Cancellation Note  Patient Details Name: Shari Horn MRN: [id number] DOB: 10/18/1940   Cancelled Treatment:    Reason Eval/Treat Not Completed: Patient at procedure or test/ unavailable(CT ) Ot to reattempt at next appropriate time  Jeri Modena 10/18/2018, 2:17 PM   Jeri Modena, OTR/L  Acute Rehabilitation Services Pager: [phone] Office: [phone] .

## 2018-10-18 NOTE — TOC Initial Note (Signed)
Transition of Care [facility]) - Initial/Assessment Note    Patient Details  Name: Shari Horn MRN: [id number] Date of Birth: July 08, 1940  Transition of Care [facility]) CM/SW Contact:    Olaf Mesa Dimitri Ped, LCSW Phone Number: 10/18/2018, 9:04 PM  Clinical Narrative: CSW at bedside with pt to conduct TOC assessment and to address consult regarding placement. CSW included pts family, [name] (daughter) and Festus Aloe (Son in [place]) in on conversation via telephone.   Pt reports that prior to being admitted she lived at home alone in a two story home but resides only on lower level. Pt goes into detail and explains that she received much support from her family. Pt explains that prior to being admitted, she was able to manage ADLs independently in a home setting and drives independently to and from medical appointments.   Pt has DME in the home such as a walker, shower bench, and raised toilet seat. Pt has a hx of short term rehab at [facility] and [facility] services with [facility]. Pt and family state that their preference is to be place with [facility] if bed offer is made.   1st shift CSW applied for PASRR screen. 2nd shift CSW  uploaded 30 day STR note and HnP to [facility]. awaiting manual nursing review.   TOC team will continue to follow pt for any discharge needs.  [facility]  Clinical Social Worker  Ph: [phone]            Expected Discharge Plan: Skilled Nursing Facility Barriers to Discharge: Continued Medical Work up, [place]), SNF Pending bed offer   Patient Goals and CMS Choice Patient states their goals for this hospitalization and ongoing recovery are:: Pt and family are agreeable to discharging to SNF for short term rehab CMS Medicare.gov Compare Post Acute Care list provided to:: Patient Choice offered to / list presented to : Patient  Expected Discharge Plan and Services Expected Discharge Plan: [place] In-house  Referral: Clinical Social Work   Post Acute Care Choice: [place] Living arrangements for the past 2 months: [place] Expected Discharge Date: 10/20/18                                    Prior Living Arrangements/Services Living arrangements for the past 2 months: Single Family Home Lives with:: Self Patient language and need for interpreter reviewed:: Yes Do you feel safe going back to the place where you live?: Yes      Need for Family Participation in Patient Care: Yes (Comment) Care giver support system in place?: Yes (comment) Current home services: DME Criminal Activity/Legal Involvement Pertinent to Current Situation/Hospitalization: No - Comment as needed  Activities of Daily Living Home Assistive Devices/Equipment: Eyeglasses ADL Screening (condition at time of admission) Patient's cognitive ability adequate to safely complete daily activities?: Yes Is the patient deaf or have difficulty hearing?: Yes Does the patient have difficulty seeing, even when wearing glasses/contacts?: No Does the patient have difficulty concentrating, remembering, or making decisions?: No Patient able to express need for assistance with ADLs?: Yes Does the patient have difficulty dressing or bathing?: No Independently performs ADLs?: Yes (appropriate for developmental age) Does the patient have difficulty walking or climbing stairs?: Yes Weakness of Legs: Both Weakness of Arms/Hands: None  Permission Sought/Granted Permission sought to share information with : Case Manager, Family Supports Permission granted to share information  with : Yes, Verbal Permission Granted  Share Information with NAME: Virginia Crews     Permission granted to share info w Relationship: Daughter  Permission granted to share info w Contact Information: PH: [phone]  Emotional Assessment Appearance:: Appears stated age Attitude/Demeanor/Rapport: Engaged, Gracious Affect  (typically observed): Accepting, Pleasant, Calm Orientation: : Oriented to Self, Oriented to Place, Oriented to  Time, Oriented to Situation Alcohol / Substance Use: Not Applicable Psych Involvement: No (comment)  Admission diagnosis:  Hypoxia [R09.02] Fall, initial encounter [W19.XXXA] Pubic ramus fracture, left, closed, initial encounter [facility]) [S32.592A] Patient Active Problem List   Diagnosis Date Noted  . Fall at home, initial encounter 10/18/2018  . Fall 10/18/2018  . Closed displaced fracture of left femoral neck ([place]) 12/12/2017  . Forgetfulness 03/10/2017  . Unsteady gait 01/25/2017  . Gastroesophageal reflux disease without esophagitis 10/12/2016  . Dysphagia 09/14/2016  . Bipolar 2 disorder ([place]) 09/14/2016  . Hypothyroidism 09/14/2016  . Positional vertigo 09/08/2013  . Bilateral carotid artery disease ([place]) 12/20/2012  . PVC's (premature ventricular contractions) 12/20/2012  . Dyslipidemia 12/20/2012   PCP:  Vivi Barrack, MD Pharmacy:   [facility], [place] - [place] [address] AT [place] [place] [address] Phone: [phone] Fax: [phone]  [id number] - [place], [place] [id number] [place] [id number] Phone: [phone] Fax: [phone]  [facility] [place], [place] - [address] AT [address] [place] [address] Phone: [phone] Fax: [phone]     Social Determinants of Health (SDOH) Interventions    Readmission Risk Interventions No flowsheet data found.

## 2018-10-18 NOTE — ED Notes (Signed)
PT at bedside.

## 2018-10-18 NOTE — ED Notes (Signed)
Pts son Festus Aloe Vorhees) : [phone]

## 2018-10-18 NOTE — ED Triage Notes (Signed)
Pt comes via [facility] got up to shut a door in a room, became dizzy and fell, LOC, c/o of L hip pain, recent replacement in November on that hip

## 2018-10-18 NOTE — H&P (Addendum)
History and Physical    Shari Horn [id number] DOB: 29-Apr-1940 DOA: 10/18/2018  PCP: Vivi Barrack, MD   Patient coming from: Home  Chief Complaint: Fall.  Inability to ambulate, pain in the left hip.  HPI: Shari Horn is a 78 y.o. female with medical history significant of hyperlipidemia, carotid artery  disease, hypothyroidism, bipolar disorder,PVCs  who presents from home with fall.  Patient was apparently all right till last night.  Last night at 2:30 AM she woke up and tried to close the door of her bedroom and while on returning to the bed she tried to grab the post but could not reach and fell on the ground.  After the fall she experienced pain on the left hip and was unable to walk.  She was brought to the emergency department this morning.  Patient has history of falls in the past and had left hip fracture last year in November and underwent left hip replacement.  She lives alone in house on the ground floor. Patient seen and examined the bedside in the emergency department.  During my evaluation, she was resting comfortably on the bed.  She did not complain of any pain while lying on the bed but complains of pain when she tries to stand on her feet.  She denies any chest pain, shortness of breath, palpitations, headache, nausea, vomiting, abdominal pain, dysuria.She was hemodynamically stable.  ED Course: X-ray of the pelvis done in the emergency department showed nondisplaced left inferior pubic ramus fracture.  She was also noted to be hypoxic initially and saturated in the range of 80s.  Chest imagings did not show any acute intrathoracic finding.During my evaluation she was saturating on 100s.  Patient was admitted for the evaluation of PT/OT and possible skilled nursing facility placement.  Review of Systems: As per HPI otherwise 10 point review of systems negative.    Past Medical History:  Diagnosis Date   Carotid artery disease ([place])    Depression    Dyslipidemia     Hyperlipidemia    Hypothyroidism    Mood disorder (H[facility])    BPD   PVC's (premature ventricular contractions)     Past Surgical History:  Procedure Laterality Date   BREAST BIOPSY     BREAST EXCISIONAL BIOPSY Left 1990   BREAST EXCISIONAL BIOPSY Left 1996   Carotid Doppler  12/2011   0-49% RICA stenosis, 0000000 LICA stenosis   TOTAL HIP ARTHROPLASTY Left 12/12/2017   Procedure: TOTAL HIP ARTHROPLASTY ANTERIOR APPROACH;  Surgeon: Rod Can, MD;  Location: [place];  Service: Orthopedics;  Laterality: Left;   TRANSTHORACIC ECHOCARDIOGRAM  12/2011   EF 123456, grade 1 diastolic dysfunction; trivial AV regurg; calcified MV annulus     reports that she quit smoking about 45 years ago. Her smoking use included cigarettes. She quit after 10.00 years of use. She has never used smokeless tobacco. She reports that she does not drink alcohol or use drugs.  Allergies  Allergen Reactions   Codeine Nausea And Vomiting and Nausea Only    Other reaction(s): Unknown    Family History  Problem Relation Age of Onset   Heart failure Mother    Thyroid disease Mother    Hypertension Mother    Diabetes Mother    Arthritis/Rheumatoid Father    Heart Problems Maternal Grandmother    Heart disease Brother        valve replacement   Heart disease Child    Colon cancer Neg Hx  Esophageal cancer Neg Hx    Stomach cancer Neg Hx      Prior to Admission medications   Medication Sig Start Date End Date Taking? Authorizing Provider  cholecalciferol (VITAMIN D3) 25 MCG (1000 UT) tablet Take 2,000 Units by mouth daily.   Yes [provider]  clonazePAM (KLONOPIN) 0.5 MG tablet Take 1 tablet (0.5 mg total) by mouth 3 (three) times daily. Patient taking differently: Take 0.5 mg by mouth 3 (three) times daily as needed for anxiety.  09/06/18  Yes Vivi Barrack, MD  ezetimibe (ZETIA) 10 MG tablet TAKE 1 TABLET BY MOUTH DAILY. SCHEDULE OFFICE VISIT FOR FURTHER  REFILLS Patient taking differently: Take 10 mg by mouth daily.  03/21/18  Yes Hilty, Nadean Corwin, MD  FLUoxetine (PROZAC) 20 MG tablet Take 60 mg by mouth daily.   Yes [provider]  lamoTRIgine (LAMICTAL) 100 MG tablet Take 100 mg by mouth daily.   Yes [provider]  levothyroxine (SYNTHROID) 75 MCG tablet TAKE 1 TABLET(75 MCG) BY MOUTH DAILY Patient taking differently: Take 75 mcg by mouth daily.  08/23/18  Yes Vivi Barrack, MD  meclizine (ANTIVERT) 25 MG tablet Take 1 tablet (25 mg total) by mouth 3 (three) times daily as needed for dizziness. 09/30/17  Yes Orma Flaming, MD  Multiple Vitamin (MULTIVITAMIN WITH MINERALS) TABS tablet Take 1 tablet by mouth daily.   Yes [provider]  naproxen sodium (ALEVE) 220 MG tablet Take 440 mg by mouth 2 (two) times daily.   Yes [provider]  pantoprazole (PROTONIX) 20 MG tablet Take 40 mg by mouth daily as needed for heartburn.    Yes [provider]  QUEtiapine (SEROQUEL) 300 MG tablet Take 300 mg by mouth at bedtime.  09/10/16  Yes [provider]  rosuvastatin (CRESTOR) 40 MG tablet TAKE 1 TABLET BY MOUTH DAILY Patient taking differently: Take 40 mg by mouth daily.  08/03/18  Yes Hilty, Nadean Corwin, MD  senna (SENOKOT) 8.6 MG TABS tablet Take 5 tablets by mouth at bedtime.    Yes [provider]    Physical Exam: Vitals:   10/18/18 1118 10/18/18 1230 10/18/18 1300 10/18/18 1443  BP: (!) 125/92 120/66 116/61 (!) 113/52  Pulse: 73 77 73 73  Resp: 16 18  18   Temp:      TempSrc:      SpO2: 99% 100% 99% 95%    Constitutional: NAD, calm, comfortable, pleasant elderly lady Vitals:   10/18/18 1118 10/18/18 1230 10/18/18 1300 10/18/18 1443  BP: (!) 125/92 120/66 116/61 (!) 113/52  Pulse: 73 77 73 73  Resp: 16 18  18   Temp:      TempSrc:      SpO2: 99% 100% 99% 95%   Eyes: PERRL, lids and conjunctivae normal ENMT: Mucous membranes are moist. Posterior pharynx clear of any exudate  or lesions.Normal dentition.  Neck: normal, supple, no masses, no thyromegaly Respiratory: clear to auscultation bilaterally, no wheezing, no crackles. Normal respiratory effort. No accessory muscle use.  Cardiovascular: Regular rate and rhythm, no murmurs / rubs / gallops. No extremity edema. 2+ pedal pulses. No carotid bruits.  Abdomen: no tenderness, no masses palpated. No hepatosplenomegaly. Bowel sounds positive.  Musculoskeletal: no clubbing / cyanosis. No joint deformity upper and lower extremities. No contractures. Normal muscle tone.  Skin: no rashes, lesions, ulcers. No induration Neurologic: CN 2-12 grossly intact. Sensation intact, DTR normal. Strength 5/5 in all 4.  Psychiatric: Normal judgment and insight. Alert and  oriented x 3. Normal mood.   Foley Catheter:None  Labs on Admission: I have personally reviewed following labs and imaging studies  CBC: Recent Labs  Lab 10/18/18 1124  WBC 8.3  NEUTROABS 5.2  HGB 11.0*  HCT 33.2*  MCV 99.4  PLT 123456   Basic Metabolic Panel: Recent Labs  Lab 10/18/18 1124  NA 138  K 3.8  CL 104  CO2 27  GLUCOSE 122*  BUN 21  CREATININE 0.85  CALCIUM 9.0   GFR: CrCl cannot be calculated (Unknown ideal weight.). Liver Function Tests: No results for input(s): AST, ALT, ALKPHOS, BILITOT, PROT, ALBUMIN in the last 168 hours. No results for input(s): LIPASE, AMYLASE in the last 168 hours. No results for input(s): AMMONIA in the last 168 hours. Coagulation Profile: No results for input(s): INR, PROTIME in the last 168 hours. Cardiac Enzymes: No results for input(s): CKTOTAL, CKMB, CKMBINDEX, TROPONINI in the last 168 hours. BNP (last 3 results) No results for input(s): PROBNP in the last 8760 hours. HbA1C: No results for input(s): HGBA1C in the last 72 hours. CBG: No results for input(s): GLUCAP in the last 168 hours. Lipid Profile: No results for input(s): CHOL, HDL, LDLCALC, TRIG, CHOLHDL, LDLDIRECT in the last 72  hours. Thyroid Function Tests: No results for input(s): TSH, T4TOTAL, FREET4, T3FREE, THYROIDAB in the last 72 hours. Anemia Panel: No results for input(s): VITAMINB12, FOLATE, FERRITIN, TIBC, IRON, RETICCTPCT in the last 72 hours. Urine analysis: No results found for: COLORURINE, APPEARANCEUR, LABSPEC, PHURINE, GLUCOSEU, HGBUR, BILIRUBINUR, KETONESUR, PROTEINUR, UROBILINOGEN, NITRITE, LEUKOCYTESUR  Radiological Exams on Admission: Dg Lumbar Spine Complete  Result Date: 10/18/2018 CLINICAL DATA:  Fall with hip and back pain EXAM: LUMBAR SPINE - COMPLETE 4+ VIEW COMPARISON:  None available FINDINGS: No evidence of acute fracture.  No traumatic malalignment. Levoscoliosis and L4-5 anterolisthesis. Diffuse lumbar degenerative disc narrowing sparing L5-S1, with bulky endplate spurring. Degenerative facet spurring at L3-4 and below. Osteopenia and atherosclerosis IMPRESSION: 1. No acute finding. 2. 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 Electronically Signed   By: Monte Fantasia M.D.   On: 10/18/2018 06:37   Ct Head Wo Contrast  Result Date: 10/18/2018 CLINICAL DATA:  Syncopal episode. EXAM: CT HEAD WITHOUT CONTRAST TECHNIQUE: Contiguous axial images were obtained from the base of the skull through the vertex without intravenous contrast. COMPARISON:  Head CT 08/22/2007 and MRI brain 06/09/2016 FINDINGS: Brain: Stable age related cerebral atrophy, ventriculomegaly and periventricular white matter disease. No extra-axial fluid collections are identified. No CT findings for acute hemispheric infarction or intracranial hemorrhage. No mass lesions. The brainstem and cerebellum are normal. Vascular: Vascular calcifications but no aneurysm or hyperdense vessels. Skull: No skull fracture or bone lesions. Sinuses/Orbits: The paranasal sinuses and mastoid air cells are clear. Other: No scalp lesions or hematoma. IMPRESSION: 1. Stable appearing age related cerebral atrophy,  ventriculomegaly and periventricular white matter disease. 2. No acute intracranial findings, mass lesion or skull fracture. Electronically Signed   By: Marijo Sanes M.D.   On: 10/18/2018 10:50   Ct Angio Chest Pe W And/or Wo Contrast  Result Date: 10/18/2018 CLINICAL DATA:  Fall with pelvis fracture.  Pain.  Positive D-dimer EXAM: CT ANGIOGRAPHY CHEST WITH CONTRAST TECHNIQUE: Multidetector CT imaging of the chest was performed using the standard protocol during bolus administration of intravenous contrast. Multiplanar CT image reconstructions and MIPs were obtained to evaluate the vascular anatomy. CONTRAST:  68mL OMNIPAQUE IOHEXOL 350 MG/ML SOLN COMPARISON:  Chest radiograph October 18, 2018 FINDINGS: Cardiovascular: There  is no demonstrable pulmonary embolus. There is no appreciable thoracic aortic aneurysm or dissection. The visualized great vessels show scattered foci of calcification. There are foci of aortic atherosclerosis. There are foci of coronary artery calcification. Rather minimal pericardial fluid may be within physiologic range. There is no pericardial thickening. Mediastinum/Nodes: Thyroid is diminutive. No thyroid lesions demonstrable on this study. There is no evident thoracic adenopathy. There is a focal hiatal hernia. Lungs/Pleura: No pneumothorax. There is bibasilar atelectatic change. There is no evident edema or consolidation. On axial slice 47 series 6, there is a 1 mm nodular opacity in the anterior segment of the right upper lobe. On axial slice 84 series 6, there is a 2 mm nodular opacity in the periphery of the lateral segment of the right lower lobe. No appreciable pleural effusions. Upper Abdomen: There is upper abdominal aortic atherosclerosis. Visualized upper abdominal structures otherwise appear unremarkable. Musculoskeletal: There is anterior wedging of the T10 vertebral body, age uncertain. There is a nondisplaced fracture of the anterior inferior aspect of the T11  vertebral body. No blastic or lytic bone lesions. No chest wall lesions. Review of the MIP images confirms the above findings. IMPRESSION: 1. No demonstrable pulmonary embolus. No thoracic aortic aneurysm or dissection. There is aortic atherosclerosis. There are foci of great vessel and coronary artery calcification. 2. No pneumothorax. Areas of mild atelectatic change. Occasional 1-2 mm nodular opacities as noted. No follow-up needed if patient is low-risk (and has no known or suspected primary neoplasm). Non-contrast chest CT can be considered in 12 months if patient is high-risk. This recommendation follows the consensus statement: Guidelines for Management of Incidental Pulmonary Nodules Detected on CT Images: From the Fleischner Society 2017; Radiology 2017; 284:228-243. 3.  Hiatal hernia. 4. Anterior wedging of the T10 vertebral body. Fracture along the inferior aspect of T11 vertebral body. Given other trauma, these fractures may well be recent. 5.  No evident adenopathy. Aortic Atherosclerosis (ICD10-I70.0). Electronically Signed   By: Lowella Grip III M.D.   On: 10/18/2018 14:25   Dg Chest Portable 1 View  Result Date: 10/18/2018 CLINICAL DATA:  Hypoxia EXAM: PORTABLE CHEST 1 VIEW COMPARISON:  12/11/2017 FINDINGS: Heart and mediastinal contours are within normal limits. No focal opacities or effusions. No acute bony abnormality. IMPRESSION: No active disease. Electronically Signed   By: Rolm Baptise M.D.   On: 10/18/2018 11:48   Dg Hip Unilat W Or Wo Pelvis 2-3 Views Left  Result Date: 10/18/2018 CLINICAL DATA:  Fall with hip and back pain EXAM: DG HIP (WITH OR WITHOUT PELVIS) 2-3V LEFT COMPARISON:  12/17/2017 FINDINGS: Left hip arthroplasty. The femoral stem is completely seen only on the lateral view. No evidence of periprosthetic fracture or loosening. Suspect a left inferior pubic ramus fracture that is nondisplaced. IMPRESSION: Suspect a nondisplaced left inferior pubic ramus fracture.  Electronically Signed   By: Monte Fantasia M.D.   On: 10/18/2018 06:35     Assessment/Plan Active Problems:   Dyslipidemia   Bipolar 2 disorder ([place])   Hypothyroidism   Unsteady gait   Fall at home, initial encounter   Fall   Fall: [place] at home.  Has history of fall and left hip fracture last year in November and underwent left hip replacement.  PT/OT consultation requested.  She is interested on skilled nursing facility placement.  Left inferior pubic ramus fracture: Nondisplaced.  Continue conservative management with pain control, physical therapy.  Imagings also showed T11 vertebral body fracture but she does not complain of  any back pain.  Continue supportive care.  Hypothyroidism: Continue Synthyroid  Carotid artery disease: Follows with cardiology.  Continue current meds continue statin.  Hyperlipidemia: Continue Crestor and Zetia.  Bipolar disorder: On Lamictal, Seroquel at home.  Anxiety/depression: On Prozac and clonazepam.  Pulmonary nodules:Occasional 1-2 mm nodular opacities seen in the CT chest.  Noncontrast CT recommended in 12 months for follow-up as she is a former smoker.    Severity of Illness: The appropriate patient status for this patient is OBSERVATION.   DVT prophylaxis: Lovenox Code Status: Full Family Communication: Discussed with daughter and son-in-law on phone Consults called: None     Shelly Coss MD [facility] Pager [phone]  If 7PM-7AM, please contact night-coverage [email or web address] Password [facility]  10/18/2018, 3:58 PM

## 2018-10-18 NOTE — ED Provider Notes (Signed)
[facility] EMERGENCY DEPARTMENT Provider Note   CSN: [id number] Arrival date & time: 10/18/18  [id number]     History   Chief Complaint Chief Complaint  Patient presents with   Fall    HPI Shari Horn is a 78 y.o. female.    78 year old female with a history of CAD, dyslipidemia, hypothyroid presents to the emergency department following a fall at home.  She states that she was having trouble sleeping, went to get up to shut a door in her room when she became dizzy and fell on her left side.  Has a history of vertigo and falls frequently.  She had no loss of consciousness.  Was lying on the ground for "a while" as her fall occurred around 0230; finally able to assist herself up to the bed.  Reports inability to put any weight on her left lower extremity secondary to pain.  Sitting upright also aggravates her symptoms.  She has not taken any medications for pain prior to arrival.  Has some baseline numbness to her left thigh following her hip replacement in November.  Denies any new or worsening numbness.  She is having some faint paresthesias in her bilateral feet.  No bowel or bladder incontinence.  Denies any chest pain or shortness of breath preceding her fall.  The history is provided by the patient. No language interpreter was used.  Fall    Past Medical History:  Diagnosis Date   Carotid artery disease ([place])    Depression    Dyslipidemia    Hyperlipidemia    Hypothyroidism    Mood disorder (HCC)    BPD   PVC's (premature ventricular contractions)     Patient Active Problem List   Diagnosis Date Noted   Closed displaced fracture of left femoral neck ([place]) 12/12/2017   Forgetfulness 03/10/2017   Unsteady gait 01/25/2017   Gastroesophageal reflux disease without esophagitis 10/12/2016   Dysphagia 09/14/2016   Bipolar 2 disorder ([place]) 09/14/2016   Hypothyroidism 09/14/2016   Positional vertigo 09/08/2013   Bilateral carotid artery  disease ([place]) 12/20/2012   PVC's (premature ventricular contractions) 12/20/2012   Dyslipidemia 12/20/2012    Past Surgical History:  Procedure Laterality Date   BREAST BIOPSY     BREAST EXCISIONAL BIOPSY Left 1990   BREAST EXCISIONAL BIOPSY Left 1996   Carotid Doppler  12/2011   0-49% RICA stenosis, 0000000 LICA stenosis   TOTAL HIP ARTHROPLASTY Left 12/12/2017   Procedure: TOTAL HIP ARTHROPLASTY ANTERIOR APPROACH;  Surgeon: Rod Can, MD;  Location: [place];  Service: Orthopedics;  Laterality: Left;   TRANSTHORACIC ECHOCARDIOGRAM  12/2011   EF [id number], grade 1 diastolic dysfunction; trivial AV regurg; calcified MV annulus     OB History   No obstetric history on file.      Home Medications    Prior to Admission medications   Medication Sig Start Date End Date Taking? Authorizing Provider  clonazePAM (KLONOPIN) 0.5 MG tablet Take 1 tablet (0.5 mg total) by mouth 3 (three) times daily. 09/06/18   Vivi Barrack, MD  ezetimibe (ZETIA) 10 MG tablet TAKE 1 TABLET BY MOUTH DAILY. SCHEDULE OFFICE VISIT FOR FURTHER REFILLS 03/21/18   Pixie Casino, MD  FLUoxetine (PROZAC) 20 MG tablet Take 60 mg by mouth daily.    [provider]  lamoTRIgine (LAMICTAL) 100 MG tablet Take 100 mg by mouth daily.    [provider]  levothyroxine (SYNTHROID) 75 MCG tablet TAKE 1 TABLET(75 MCG) BY MOUTH DAILY  08/23/18   Vivi Barrack, MD  meclizine (ANTIVERT) 25 MG tablet Take 1 tablet (25 mg total) by mouth 3 (three) times daily as needed for dizziness. 09/30/17   Orma Flaming, MD  QUEtiapine (SEROQUEL) 300 MG tablet Take 300 mg by mouth at bedtime.  09/10/16   [provider]  rosuvastatin (CRESTOR) 40 MG tablet TAKE 1 TABLET BY MOUTH DAILY 08/03/18   Hilty, Nadean Corwin, MD  senna (SENOKOT) 8.6 MG TABS tablet Take 5 tablets by mouth at bedtime.     [provider]    Family History Family History  Problem Relation Age of Onset   Heart failure Mother     Thyroid disease Mother    Hypertension Mother    Diabetes Mother    Arthritis/Rheumatoid Father    Heart Problems Maternal Grandmother    Heart disease Brother        valve replacement   Heart disease Child    Colon cancer Neg Hx    Esophageal cancer Neg Hx    Stomach cancer Neg Hx     Social History Social History   Tobacco Use   Smoking status: Former Smoker    Years: 10.00    Types: Cigarettes    Quit date: 12/19/1972    Years since quitting: 45.8   Smokeless tobacco: Never Used  Substance Use Topics   Alcohol use: No    Comment: quit in 1973   Drug use: No     Allergies   Codeine   Review of Systems Review of Systems Ten systems reviewed and are negative for acute change, except as noted in the HPI.    Physical Exam Updated Vital Signs BP 132/69    Pulse 86    Temp 98.3 F (36.8 C) (Oral)    Resp 18    LMP  (LMP Unknown)    SpO2 95%   Physical Exam Vitals signs and nursing note reviewed.  Constitutional:      General: She is not in acute distress.    Appearance: She is well-developed. She is not diaphoretic.     Comments: Pleasant, nontoxic  HENT:     Head: Normocephalic and atraumatic.  Eyes:     General: No scleral icterus.    Conjunctiva/sclera: Conjunctivae normal.  Neck:     Musculoskeletal: Normal range of motion.  Cardiovascular:     Rate and Rhythm: Normal rate and regular rhythm.     Pulses: Normal pulses.     Comments: DP pulse 2+ in the left lower extremity. Pulmonary:     Effort: Pulmonary effort is normal. No respiratory distress.  Musculoskeletal: Normal range of motion.     Comments: Tenderness to palpation to the left lateral hip without crepitus or deformity.  No leg shortening or malrotation.  Skin:    General: Skin is warm and dry.     Coloration: Skin is not pale.     Findings: No erythema or rash.  Neurological:     General: No focal deficit present.     Mental Status: She is alert and oriented to person,  place, and time.     Coordination: Coordination normal.     Comments: Sensation to light touch intact and at baseline in bilateral lower extremities.  Patient able to wiggle all toes.  Psychiatric:        Behavior: Behavior normal.      ED Treatments / Results  Labs (all labs ordered are listed, but only abnormal results are displayed)  Labs Reviewed - No data to display  EKG EKG Interpretation  Date/Time:  Tuesday October 18 2018 05:30:51 EDT Ventricular Rate:  84 PR Interval:    QRS Duration: 72 QT Interval:  397 QTC Calculation: 470 R Axis:   71 Text Interpretation:  Sinus rhythm RSR' in V1 or V2, probably normal variant Otherwise no significant change Confirmed by Addison Lank [phone]) on 10/18/2018 5:32:52 AM   Radiology Dg Lumbar Spine Complete  Result Date: 10/18/2018 CLINICAL DATA:  Fall with hip and back pain EXAM: LUMBAR SPINE - COMPLETE 4+ VIEW COMPARISON:  None available FINDINGS: No evidence of acute fracture.  No traumatic malalignment. Levoscoliosis and L4-5 anterolisthesis. Diffuse lumbar degenerative disc narrowing sparing L5-S1, with bulky endplate spurring. Degenerative facet spurring at L3-4 and below. Osteopenia and atherosclerosis IMPRESSION: 1. No acute finding. 2. 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 Electronically Signed   By: Monte Fantasia M.D.   On: 10/18/2018 06:37   Dg Hip Unilat W Or Wo Pelvis 2-3 Views Left  Result Date: 10/18/2018 CLINICAL DATA:  Fall with hip and back pain EXAM: DG HIP (WITH OR WITHOUT PELVIS) 2-3V LEFT COMPARISON:  12/17/2017 FINDINGS: Left hip arthroplasty. The femoral stem is completely seen only on the lateral view. No evidence of periprosthetic fracture or loosening. Suspect a left inferior pubic ramus fracture that is nondisplaced. IMPRESSION: Suspect a nondisplaced left inferior pubic ramus fracture. Electronically Signed   By: Monte Fantasia M.D.   On: 10/18/2018 06:35     Procedures Procedures (including critical care time)  Medications Ordered in ED Medications  oxyCODONE-acetaminophen (PERCOCET/ROXICET) 5-325 MG per tablet 1 tablet (1 tablet Oral Given 10/18/18 0647)     Initial Impression / Assessment and Plan / ED Course  I have reviewed the triage vital signs and the nursing notes.  Pertinent labs & imaging results that were available during my care of the patient were reviewed by me and considered in my medical decision making (see chart for details).        78 year old female who lives at home alone presents to the emergency department after a fall at home.  History of frequent falls as well as vertigo.  Had some dizziness prior to the fall without preceding chest pain or shortness of breath.  Neurovascularly intact on exam, but with complaints of left hip pain.  History of left hip replacement in November.  No leg shortening or malrotation noted.  Patient underwent x-rays which show no periprosthetic fracture or dislocation.  She does appear to have a nondisplaced left pubic rami fracture.  States that she does not ambulate with any assistive devices at home.  Has been unable to bear weight on her left leg secondary to pain.  Given that she is a fall risk at baseline, there is concerned about patient's safety should she be discharged.  Will consult PT/OT as well as care management and social work.  Patient signed out to Marvia Pickles, PA-C at change of shift pending outstanding consultations.   Final Clinical Impressions(s) / ED Diagnoses   Final diagnoses:  Pubic ramus fracture, left, closed, initial encounter [facility])  Fall, initial encounter    ED Discharge Orders    None       Antonietta Breach, PA-C 10/18/18 [id number]    Fatima Blank, MD 10/19/18 [phone]

## 2018-10-18 NOTE — Discharge Planning (Signed)
EDCM to follow for disposition needs.  

## 2018-10-18 NOTE — ED Notes (Signed)
Pt given food and drink per verbal order (Courtni, PA)

## 2018-10-18 NOTE — ED Provider Notes (Signed)
Care assumed from [facility], PA-C at shift change pending PT/OT consult. See her note for full H&P.   Per her note, "78 year old female with a history of CAD, dyslipidemia, hypothyroid presents to the emergency department following a fall at home.  She states that she was having trouble sleeping, went to get up to shut a door in her room when she became dizzy and fell on her left side.  Has a history of vertigo and falls frequently.  She had no loss of consciousness.  Was lying on the ground for "a while" as her fall occurred around 0230; finally able to assist herself up to the bed.  Reports inability to put any weight on her left lower extremity secondary to pain.  Sitting upright also aggravates her symptoms.  She has not taken any medications for pain prior to arrival.  Has some baseline numbness to her left thigh following her hip replacement in November.  Denies any new or worsening numbness.  She is having some faint paresthesias in her bilateral feet.  No bowel or bladder incontinence.  Denies any chest pain or shortness of breath preceding her fall."  Physical Exam  BP (!) 113/52   Pulse 73   Temp 98.3 F (36.8 C) (Oral)   Resp 18   LMP  (LMP Unknown)   SpO2 95%   Physical Exam Vitals signs and nursing note reviewed.  Constitutional:      General: She is not in acute distress.    Appearance: She is well-developed.  HENT:     Head: Normocephalic and atraumatic.  Eyes:     Conjunctiva/sclera: Conjunctivae normal.  Neck:     Musculoskeletal: Neck supple.  Cardiovascular:     Rate and Rhythm: Normal rate and regular rhythm.     Heart sounds: Normal heart sounds. No murmur.  Pulmonary:     Effort: Pulmonary effort is normal. No respiratory distress.     Breath sounds: Normal breath sounds. No wheezing, rhonchi or rales.  Abdominal:     Palpations: Abdomen is soft.     Tenderness: There is no abdominal tenderness.  Musculoskeletal:     Comments: TTP To the left hip  Skin:  General: Skin is warm and dry.  Neurological:     Mental Status: She is alert.     ED Course/Procedures     Procedures  Results for orders placed or performed during the hospital encounter of 10/18/18  SARS Coronavirus 2 [facility] order, Performed in [facility] hospital lab) Nasopharyngeal Nasopharyngeal Swab   Specimen: Nasopharyngeal Swab  Result Value Ref Range   SARS Coronavirus 2 NEGATIVE NEGATIVE  CBC with Differential  Result Value Ref Range   WBC 8.3 4.0 - 10.5 K/uL   RBC 3.34 (L) 3.87 - 5.11 MIL/uL   Hemoglobin 11.0 (L) 12.0 - 15.0 g/dL   HCT 33.2 (L) 36.0 - 46.0 %   MCV 99.4 80.0 - 100.0 fL   MCH 32.9 26.0 - 34.0 pg   MCHC 33.1 30.0 - 36.0 g/dL   RDW 12.7 11.5 - 15.5 %   Platelets 196 150 - 400 K/uL   nRBC 0.0 0.0 - 0.2 %   Neutrophils Relative % 63 %   Neutro Abs 5.2 1.7 - 7.7 K/uL   Lymphocytes Relative 26 %   Lymphs Abs 2.1 0.7 - 4.0 K/uL   Monocytes Relative 8 %   Monocytes Absolute 0.6 0.1 - 1.0 K/uL   Eosinophils Relative 3 %   Eosinophils Absolute 0.3 0.0 -  0.5 K/uL   Basophils Relative 0 %   Basophils Absolute 0.0 0.0 - 0.1 K/uL   Immature Granulocytes 0 %   Abs Immature Granulocytes 0.03 0.00 - 0.07 K/uL  Basic metabolic panel  Result Value Ref Range   Sodium 138 135 - 145 mmol/L   Potassium 3.8 3.5 - 5.1 mmol/L   Chloride 104 98 - 111 mmol/L   CO2 27 22 - 32 mmol/L   Glucose, Bld 122 (H) 70 - 99 mg/dL   BUN 21 8 - 23 mg/dL   Creatinine, Ser 0.85 0.44 - 1.00 mg/dL   Calcium 9.0 8.9 - 10.3 mg/dL   GFR calc non Af Amer >60 >60 mL/min   GFR calc Af Amer >60 >60 mL/min   Anion gap 7 5 - 15  D-dimer, quantitative (not at [facility])  Result Value Ref Range   D-Dimer, Quant 10.89 (H) 0.00 - 0.50 ug/mL-FEU   Dg Lumbar Spine Complete  Result Date: 10/18/2018 CLINICAL DATA:  Fall with hip and back pain EXAM: LUMBAR SPINE - COMPLETE 4+ VIEW COMPARISON:  None available FINDINGS: No evidence of acute fracture.  No traumatic malalignment. Levoscoliosis and  L4-5 anterolisthesis. Diffuse lumbar degenerative disc narrowing sparing L5-S1, with bulky endplate spurring. Degenerative facet spurring at L3-4 and below. Osteopenia and atherosclerosis IMPRESSION: 1. No acute finding. 2. 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 Electronically Signed   By: Monte Fantasia M.D.   On: 10/18/2018 06:37   Ct Head Wo Contrast  Result Date: 10/18/2018 CLINICAL DATA:  Syncopal episode. EXAM: CT HEAD WITHOUT CONTRAST TECHNIQUE: Contiguous axial images were obtained from the base of the skull through the vertex without intravenous contrast. COMPARISON:  Head CT 08/22/2007 and MRI brain 06/09/2016 FINDINGS: Brain: Stable age related cerebral atrophy, ventriculomegaly and periventricular white matter disease. No extra-axial fluid collections are identified. No CT findings for acute hemispheric infarction or intracranial hemorrhage. No mass lesions. The brainstem and cerebellum are normal. Vascular: Vascular calcifications but no aneurysm or hyperdense vessels. Skull: No skull fracture or bone lesions. Sinuses/Orbits: The paranasal sinuses and mastoid air cells are clear. Other: No scalp lesions or hematoma. IMPRESSION: 1. Stable appearing age related cerebral atrophy, ventriculomegaly and periventricular white matter disease. 2. No acute intracranial findings, mass lesion or skull fracture. Electronically Signed   By: Marijo Sanes M.D.   On: 10/18/2018 10:50   Ct Angio Chest Pe W And/or Wo Contrast  Result Date: 10/18/2018 CLINICAL DATA:  Fall with pelvis fracture.  Pain.  Positive D-dimer EXAM: CT ANGIOGRAPHY CHEST WITH CONTRAST TECHNIQUE: Multidetector CT imaging of the chest was performed using the standard protocol during bolus administration of intravenous contrast. Multiplanar CT image reconstructions and MIPs were obtained to evaluate the vascular anatomy. CONTRAST:  72mL OMNIPAQUE IOHEXOL 350 MG/ML SOLN COMPARISON:  Chest radiograph  October 18, 2018 FINDINGS: Cardiovascular: There is no demonstrable pulmonary embolus. There is no appreciable thoracic aortic aneurysm or dissection. The visualized great vessels show scattered foci of calcification. There are foci of aortic atherosclerosis. There are foci of coronary artery calcification. Rather minimal pericardial fluid may be within physiologic range. There is no pericardial thickening. Mediastinum/Nodes: Thyroid is diminutive. No thyroid lesions demonstrable on this study. There is no evident thoracic adenopathy. There is a focal hiatal hernia. Lungs/Pleura: No pneumothorax. There is bibasilar atelectatic change. There is no evident edema or consolidation. On axial slice 47 series 6, there is a 1 mm nodular opacity in the anterior segment of the right upper lobe. On  axial slice 84 series 6, there is a 2 mm nodular opacity in the periphery of the lateral segment of the right lower lobe. No appreciable pleural effusions. Upper Abdomen: There is upper abdominal aortic atherosclerosis. Visualized upper abdominal structures otherwise appear unremarkable. Musculoskeletal: There is anterior wedging of the T10 vertebral body, age uncertain. There is a nondisplaced fracture of the anterior inferior aspect of the T11 vertebral body. No blastic or lytic bone lesions. No chest wall lesions. Review of the MIP images confirms the above findings. IMPRESSION: 1. No demonstrable pulmonary embolus. No thoracic aortic aneurysm or dissection. There is aortic atherosclerosis. There are foci of great vessel and coronary artery calcification. 2. No pneumothorax. Areas of mild atelectatic change. Occasional 1-2 mm nodular opacities as noted. No follow-up needed if patient is low-risk (and has no known or suspected primary neoplasm). Non-contrast chest CT can be considered in 12 months if patient is high-risk. This recommendation follows the consensus statement: Guidelines for Management of Incidental Pulmonary  Nodules Detected on CT Images: From the Fleischner Society 2017; Radiology 2017; 284:228-243. 3.  Hiatal hernia. 4. Anterior wedging of the T10 vertebral body. Fracture along the inferior aspect of T11 vertebral body. Given other trauma, these fractures may well be recent. 5.  No evident adenopathy. Aortic Atherosclerosis (ICD10-I70.0). Electronically Signed   By: Lowella Grip III M.D.   On: 10/18/2018 14:25   Dg Chest Portable 1 View  Result Date: 10/18/2018 CLINICAL DATA:  Hypoxia EXAM: PORTABLE CHEST 1 VIEW COMPARISON:  12/11/2017 FINDINGS: Heart and mediastinal contours are within normal limits. No focal opacities or effusions. No acute bony abnormality. IMPRESSION: No active disease. Electronically Signed   By: Rolm Baptise M.D.   On: 10/18/2018 11:48   Dg Hip Unilat W Or Wo Pelvis 2-3 Views Left  Result Date: 10/18/2018 CLINICAL DATA:  Fall with hip and back pain EXAM: DG HIP (WITH OR WITHOUT PELVIS) 2-3V LEFT COMPARISON:  12/17/2017 FINDINGS: Left hip arthroplasty. The femoral stem is completely seen only on the lateral view. No evidence of periprosthetic fracture or loosening. Suspect a left inferior pubic ramus fracture that is nondisplaced. IMPRESSION: Suspect a nondisplaced left inferior pubic ramus fracture. Electronically Signed   By: Monte Fantasia M.D.   On: 10/18/2018 06:35   Vas US Carotid  Result Date: 10/03/2018 Carotid Arterial Duplex Study Indications:       Carotid artery disease. Patient denies any new                    cerebrovascular symptoms. Risk Factors:      Hyperlipidemia, past history of smoking. Comparison Study:  In 9/19, a carotid duplex showed a velocity of 84/31 cm/sec                    in the RICA and 123XX123 cm/sec in the LICA. In 8/18, a carotid                    duplex showed velocity of 106/15 cm/sec in the RICA and                    [id number] cm/sec in the LICA. Performing Technologist: Mariane Masters RVT  Examination Guidelines: A complete evaluation  includes B-mode imaging, spectral Doppler, color Doppler, and power Doppler as needed of all accessible portions of each vessel. Bilateral testing is considered an integral part of a complete examination. Limited examinations for reoccurring indications may be performed as noted.  Right Carotid Findings: +----------+--------+--------+--------+------------------+--------+           PSV cm/sEDV cm/sStenosisPlaque DescriptionComments +----------+--------+--------+--------+------------------+--------+ CCA Prox  102     21                                         +----------+--------+--------+--------+------------------+--------+ CCA Distal51      14                                         +----------+--------+--------+--------+------------------+--------+ ICA Prox  94      35      1-39%   heterogenous               +----------+--------+--------+--------+------------------+--------+ ICA Mid   87      38                                         +----------+--------+--------+--------+------------------+--------+ ICA Distal66      24                                         +----------+--------+--------+--------+------------------+--------+ ECA       109     20                                         +----------+--------+--------+--------+------------------+--------+ +----------+--------+-------+----------------+-------------------+           PSV cm/sEDV cmsDescribe        Arm Pressure (mmHG) +----------+--------+-------+----------------+-------------------+ [id number]            Multiphasic, [id number]                 +----------+--------+-------+----------------+-------------------+ +---------+--------+--+--------+--+ VertebralPSV cm/s58EDV cm/s13 +---------+--------+--+--------+--+  Left Carotid Findings: +----------+--------+--------+--------+------------------+---------+           PSV cm/sEDV cm/sStenosisPlaque DescriptionComments   +----------+--------+--------+--------+------------------+---------+ CCA Prox  70      15                                          +----------+--------+--------+--------+------------------+---------+ CCA Distal69      22                                          +----------+--------+--------+--------+------------------+---------+ ICA Prox  156     43      40-59%  hypoechoic        Shadowing +----------+--------+--------+--------+------------------+---------+ ICA Mid   92      26                                          +----------+--------+--------+--------+------------------+---------+ ICA Distal78      25                                          +----------+--------+--------+--------+------------------+---------+  ECA       74      17                                          +----------+--------+--------+--------+------------------+---------+ +----------+--------+--------+----------------+-------------------+           PSV cm/sEDV cm/sDescribe        Arm Pressure (mmHG) +----------+--------+--------+----------------+-------------------+ Subclavian60              Multiphasic, [id number]                 +----------+--------+--------+----------------+-------------------+ +---------+--------+--+--------+--+---------+ VertebralPSV cm/s48EDV cm/s13Antegrade +---------+--------+--+--------+--+---------+  Summary: Right Carotid: Velocities in the right ICA are consistent with a 1-39% stenosis. Left Carotid: Velocities in the left ICA are consistent with a 40-59% stenosis. Vertebrals:  Bilateral vertebral arteries demonstrate antegrade flow. Subclavians: Normal flow hemodynamics were seen in bilateral subclavian              arteries. *See table(s) above for measurements and observations. Suggest follow up study in 12 months. Electronically signed by Jenkins Rouge MD on 10/03/2018 at 1:51:02 PM.    Final      MDM   Briefly, pt with frequent h/o falls. States she  got dizzy and fell yesterday. Denies preceding cp, sob, or palpitations. Prior provider noted that pt had pubic rami fracture on xray. Pt unable to ambulate and currently lives alone w/o assistance. She is pending PT/OT eval and dispoisition decision.   On my eval, pt states she thinks she hit her head. She is not sure if she passed out. She is only c/o left hip pain. Denies other sxs.  10:00 AM Informed by nursing staff that pt oxygen had dropped to 86% on RA. She was placed on 2L  10:10 AM Reassessed patient. Trialed w/o O2. Sats stayed at 94% and above throughout eval. She denies sob.  11:10 AM Reassessed pt. Sats now at 86-88% on RA. She denies any sob or chest pain. Denies cough or fevers. Will add labs, cxr, ct head   CBC with mild anemia, h/o same. No leukocytosis BMP nonacute Ddimer elevated above 10, will proceed with CTA COVID negative  CXR with acute finding  CT head negative for acute abnormality   CTA chest negative for PE, pneumonia, or pneumothorax.   3:23 PM CONSULT With Dr. Tawanna Solo with hospitalist service who accepts pt for admission.    Bishop Dublin 10/18/18 1524    Davonna Belling, MD 10/18/18 1525

## 2018-10-18 NOTE — NC FL2 (Signed)
[place] MEDICAID FL2 LEVEL OF CARE SCREENING TOOL     IDENTIFICATION  Patient Name: Shari Horn Birthdate: [birth year] Sex: female Admission Date (Current Location): 10/18/2018  [facility] and [place] Number:  Herbalist and Address:  [facility] [place], [place] [address] [place], [place] [address]      Provider Number: [id number]  Attending Physician Name and Address:  Shelly Coss, MD  Relative Name and Phone Number:  Virginia Crews    Current Level of Care: Hospital Recommended Level of Care: [place] Prior Approval Number:    Date Approved/Denied:   PASRR Number:    Discharge Plan: SNF    Current Diagnoses: Patient Active Problem List   Diagnosis Date Noted  . Fall at home, initial encounter 10/18/2018  . Fall 10/18/2018  . Closed displaced fracture of left femoral neck ([place]) 12/12/2017  . Forgetfulness 03/10/2017  . Unsteady gait 01/25/2017  . Gastroesophageal reflux disease without esophagitis 10/12/2016  . Dysphagia 09/14/2016  . Bipolar 2 disorder ([place]) 09/14/2016  . Hypothyroidism 09/14/2016  . Positional vertigo 09/08/2013  . Bilateral carotid artery disease ([place]) 12/20/2012  . PVC's (premature ventricular contractions) 12/20/2012  . Dyslipidemia 12/20/2012    Orientation RESPIRATION BLADDER Height & Weight     Self, Time, Situation, Place  Normal Continent Weight: 170 lb 6.7 oz (77.3 kg) Height:  5\' 7"  (170.2 cm)  BEHAVIORAL SYMPTOMS/MOOD NEUROLOGICAL BOWEL NUTRITION STATUS      Continent    AMBULATORY STATUS COMMUNICATION OF NEEDS Skin   Extensive Assist Verbally Normal                       Personal Care Assistance Level of Assistance  Bathing, Feeding, Dressing Bathing Assistance: Limited assistance Feeding assistance: Independent Dressing Assistance: Limited assistance     Functional Limitations Info  Sight, Hearing, Speech Sight Info: Impaired(wears eye glasses) Hearing Info:  Adequate Speech Info: Adequate    SPECIAL CARE FACTORS FREQUENCY  PT (By licensed PT), OT (By licensed OT)     PT Frequency: 3x weekly OT Frequency: 3x weekly            Contractures Contractures Info: Not present    Additional Factors Info  Code Status, Allergies, Psychotropic Code Status Info: Full code Allergies Info: Codeine Psychotropic Info: lamoTRIgine (LAMICTAL) 100 MG tablet         Current Medications (10/18/2018):  This is the current hospital active medication list Current Facility-Administered Medications  Medication Dose Route Frequency Provider Last Rate Last Dose  . clonazePAM (KLONOPIN) tablet 0.5 mg  0.5 mg Oral TID PRN Shelly Coss, MD      . enoxaparin (LOVENOX) injection 40 mg  40 mg Subcutaneous Q24H Shelly Coss, MD   40 mg at 10/18/18 1917  . ezetimibe (ZETIA) tablet 10 mg  10 mg Oral Daily Shelly Coss, MD   10 mg at 10/18/18 1030  . FLUoxetine (PROZAC) capsule 60 mg  60 mg Oral Daily Shelly Coss, MD   60 mg at 10/18/18 1230  . lamoTRIgine (LAMICTAL) tablet 100 mg  100 mg Oral Daily Shelly Coss, MD   100 mg at 10/18/18 1119  . levothyroxine (SYNTHROID) tablet 75 mcg  75 mcg Oral Q0600 Shelly Coss, MD      . meclizine (ANTIVERT) tablet 25 mg  25 mg Oral TID PRN Shelly Coss, MD      . oxyCODONE-acetaminophen (PERCOCET/ROXICET) 5-325 MG per tablet 1 tablet  1 tablet Oral Q6H  PRN Shelly Coss, MD   1 tablet at 10/18/18 1729  . QUEtiapine (SEROQUEL) tablet 300 mg  300 mg Oral QHS Shelly Coss, MD   300 mg at 10/18/18 2135  . rosuvastatin (CRESTOR) tablet 40 mg  40 mg Oral Daily Shelly Coss, MD   40 mg at 10/18/18 1030  . senna (SENOKOT) tablet 43 mg  5 tablet Oral QHS Shelly Coss, MD   43 mg at 10/18/18 2136     Discharge Medications: Please see discharge summary for a list of discharge medications.  Relevant Imaging Results:  Relevant Lab Results:   Additional Information SSN: [id number]  [place], LCSW

## 2018-10-19 DIAGNOSIS — E785 Hyperlipidemia, unspecified: Secondary | ICD-10-CM | POA: Diagnosis not present

## 2018-10-19 DIAGNOSIS — E038 Other specified hypothyroidism: Secondary | ICD-10-CM | POA: Diagnosis not present

## 2018-10-19 DIAGNOSIS — F3181 Bipolar II disorder: Secondary | ICD-10-CM | POA: Diagnosis not present

## 2018-10-19 DIAGNOSIS — Y92009 Unspecified place in unspecified non-institutional (private) residence as the place of occurrence of the external cause: Secondary | ICD-10-CM

## 2018-10-19 DIAGNOSIS — R2681 Unsteadiness on feet: Secondary | ICD-10-CM

## 2018-10-19 DIAGNOSIS — W19XXXA Unspecified fall, initial encounter: Secondary | ICD-10-CM

## 2018-10-19 MED ORDER — OXYCODONE-ACETAMINOPHEN 5-325 MG PO TABS
1.0000 | ORAL_TABLET | ORAL | Status: DC | PRN
  Administered 2018-10-19 – 2018-10-22 (×7): 1 via ORAL
  Filled 2018-10-19 (×7): qty 1

## 2018-10-19 MED ORDER — MORPHINE SULFATE (PF) 2 MG/ML IV SOLN
2.0000 mg | INTRAVENOUS | Status: DC | PRN
  Administered 2018-10-20: 20:00:00 2 mg via INTRAVENOUS
  Filled 2018-10-19: qty 1

## 2018-10-19 NOTE — Progress Notes (Addendum)
Patient states that her pain is a 10/10 when out of bed. Provider has been made aware, prn pain medications have been added to [facility]. Patient does not wear oxygen at home, but O2 stats have been dropping (88%-89%) when O2 (2L) is off. Per provider, d/c oxygen.

## 2018-10-19 NOTE — Evaluation (Signed)
Occupational Therapy Evaluation Patient Details Name: Shari Horn MRN: [id number] DOB: March 23, 1940 Today's Date: 10/19/2018    History of Present Illness Pt is a 78 y/o female presenting to the ED following a fall. Found to have L inferior pubic rami fx. PMH includes CAD, bipolar disorder, vertigo, and L THA.   Clinical Impression   Pt admitted with L inferior pubic fractue. Pt currently with functional limitations due to the deficits listed below (see OT Problem List).  Pt will benefit from skilled OT to increase their safety and independence with ADL and functional mobility for ADL to facilitate discharge to venue listed below.   Pt will need significant A with ADL activity at home and  Is aware she will ST SNF.  Pt prefers [facility]     Follow Up Recommendations  SNF    Equipment Recommendations  None recommended by OT    Recommendations for Other Services       Precautions / Restrictions Restrictions Weight Bearing Restrictions: Yes LLE Weight Bearing: Weight bearing as tolerated      Mobility Bed Mobility Overal bed mobility: Needs Assistance Bed Mobility: Supine to Sit     Supine to sit: Mod assist        Transfers Overall transfer level: Needs assistance Equipment used: Rolling walker (2 wheeled) Transfers: Sit to/from [employer] Transfers Sit to Stand: Mod assist Stand pivot transfers: Mod assist       General transfer comment: increased time.  Ptdid more scooting feet rather than taking steps. encouraged pt to put weight on walker    Balance Overall balance assessment: Needs assistance Sitting-balance support: Bilateral upper extremity supported;Feet unsupported Sitting balance-Leahy Scale: Fair                                     ADL either performed or assessed with clinical judgement   ADL Overall ADL's : Needs assistance/impaired Eating/Feeding: Set up;Sitting   Grooming: Set up;Sitting   Upper Body Bathing: Set  up;Sitting   Lower Body Bathing: Maximal assistance;Sit to/from stand;Cueing for safety;Cueing for compensatory techniques   Upper Body Dressing : Set up;Sitting   Lower Body Dressing: Maximal assistance;Sit to/from stand;Cueing for compensatory techniques;Cueing for safety;Cueing for sequencing   Toilet Transfer: Moderate assistance;Stand-pivot;Cueing for safety;Cueing for sequencing;RW   Toileting- Clothing Manipulation and Hygiene: Moderate assistance;Sit to/from stand;Cueing for safety;Cueing for sequencing         General ADL Comments: pt will need ST SNF for rehab as pt lives alone and will not be able to take care of herself     Vision Baseline Vision/History: No visual deficits              Pertinent Vitals/Pain Pain Assessment: 0-10 Pain Score: 4  Pain Location: L groin/pelvis area Pain Descriptors / Indicators: Grimacing;Guarding Pain Intervention(s): Limited activity within patient's tolerance;Repositioned     Hand Dominance     Extremity/Trunk Assessment Upper Extremity Assessment Upper Extremity Assessment: Generalized weakness           Communication     Cognition Arousal/Alertness: Awake/alert Behavior During Therapy: WFL for tasks assessed/performed Overall Cognitive Status: Within Functional Limits for tasks assessed  Home Living Family/patient expects to be discharged to:: Skilled nursing facility Living Arrangements: Alone                                               OT Problem List: Decreased activity tolerance;Impaired balance (sitting and/or standing);Decreased safety awareness;Decreased knowledge of use of DME or AE      OT Treatment/Interventions: Self-care/ADL training;DME and/or AE instruction;Patient/family education    OT Goals(Current goals can be found in the care plan section) Acute Rehab OT Goals Patient Stated Goal: to be more  independent and decrease pain OT Goal Formulation: With patient Time For Goal Achievement: 10/26/18  OT Frequency: Min 2X/week   Barriers to D/C: Decreased caregiver support             AM-PAC OT "6 Clicks" Daily Activity     Outcome Measure Help from another person eating meals?: None Help from another person taking care of personal grooming?: A Little Help from another person toileting, which includes using toliet, bedpan, or urinal?: A Lot Help from another person bathing (including washing, rinsing, drying)?: A Little Help from another person to put on and taking off regular upper body clothing?: A Little Help from another person to put on and taking off regular lower body clothing?: A Lot 6 Click Score: 17   End of Session Equipment Utilized During Treatment: Rolling walker Nurse Communication: Mobility status  Activity Tolerance: Patient tolerated treatment well Patient left: in chair  OT Visit Diagnosis: Unsteadiness on feet (R26.81);Other abnormalities of gait and mobility (R26.89);History of falling (Z91.81);Muscle weakness (generalized) (M62.81);Repeated falls (R29.6)                Time: 1300-1320 OT Time Calculation (min): 20 min Charges:  OT General Charges $OT Visit: 1 Visit OT Evaluation $OT Eval Moderate Complexity: 1 Mod  Shari Horn, [place] [phone] Office- [phone]     Shari Horn, Edwena Felty D 10/19/2018, 1:35 PM

## 2018-10-19 NOTE — Progress Notes (Addendum)
PROGRESS NOTE    Shari Horn  [id number] DOB: [birth year] DOA: 10/18/2018 PCP: Vivi Barrack, MD   Brief Narrative:  HPI On 10/18/2018 by Dr. Shelly Coss Shari Horn is a 78 y.o. female with medical history significant of hyperlipidemia, carotid artery  disease, hypothyroidism, bipolar disorder,PVCs  who presents from home with fall.  Patient was apparently all right till last night.  Last night at 2:30 AM she woke up and tried to close the door of her bedroom and while on returning to the bed she tried to grab the post but could not reach and fell on the ground.  After the fall she experienced pain on the left hip and was unable to walk.  She was brought to the emergency department this morning.  Patient has history of falls in the past and had left hip fracture last year in November and underwent left hip replacement.  She lives alone in house on the ground floor. Patient seen and examined the bedside in the emergency department.  During my evaluation, she was resting comfortably on the bed.  She did not complain of any pain while lying on the bed but complains of pain when she tries to stand on her feet.  She denies any chest pain, shortness of breath, palpitations, headache, nausea, vomiting, abdominal pain, dysuria.She was hemodynamically stable.  Assessment & Plan   Left inferior pubic ramus fracture secondary to fall -Patient with history of fall and left hip fracture last year November, underwent left hip replacement -Patient noted to have nondisplaced left inferior pubic ramus fracture on x-ray -PT recommending SNF -Pending OT evaluation -Continue pain control- will add on IV morphine given that pain is 10/10 with minimal movement.   T11 vertebral body fracture -Noted on imaging however patient does not complain of pain -Manage conservatively  Hypothyroidism -Continue Synthroid  Coronary artery disease -Patient follows with cardiology, continue  statin  Hyperlipidemia -Continue statin, Zetia  Bipolar disorder -Stable, continue Lamictal, Seroquel  Anxiety and depression -continue Prozac and clonazepam  Pulmonary nodules -Occasional 1 to 2 mm nodular opacity seen on CT chest.  Noncontrast CT recommended 12 months for follow-up as she is a former smoker  Hypoxia -Currently on oxygen.  No history of oxygen use at home -Chest x-ray unremarkable for infection -CTA chest showed no PE.  No thoracic aortic aneurysm or dissection.  There is aortic atherosclerosis.  Foci of great vessel and coronary artery calcification.  No pneumothorax. -Will order incentive spirometry and wean if possible  DVT Prophylaxis   Lovenox  Code Status: Full  Family Communication: None at bedside  Disposition Plan: Currently in observation. Pending OT evaluation.  Suspect given patient's pain with ambulation and frequent falls, will need SNF placement.  Consultants None  Procedures  None  Antibiotics   Anti-infectives (From admission, onward)   None      Subjective:   Shari Horn seen and examined today.  Patient complains of pain with movement.  States her pain is well controlled when she is absolutely still.  Denies current chest pain or shortness of breath, nausea or vomiting, abdominal pain, dizziness or headache.  Objective:   Vitals:   10/18/18 1811 10/18/18 1940 10/19/18 0344 10/19/18 0815  BP: (!) 127/95 (!) 117/59 133/62 127/71  Pulse: 70 75 82 82  Resp: 18 17 17 17   Temp: 98.1 F (36.7 C) 98.1 F (36.7 C) 98.9 F (37.2 C) 98.1 F (36.7 C)  TempSrc: Oral Oral Oral Oral  SpO2:  99% 96% 97%  Weight: 77.3 kg     Height: 5\' 7"  (1.702 m)       Intake/Output Summary (Last 24 hours) at 10/19/2018 0958 Last data filed at 10/19/2018 0946 Gross per 24 hour  Intake 240 ml  Output --  Net 240 ml   Filed Weights   10/18/18 1811  Weight: 77.3 kg    Exam  General: Well developed, well nourished, NAD, appears stated  age  44: NCAT, mucous membranes moist.   Neck: Supple, no JVD, no masses  Cardiovascular: S1 S2 auscultated, RRR, no murmur  Respiratory: Clear to auscultation bilaterally with equal chest rise  Abdomen: Soft, nontender, nondistended, + bowel sounds  Extremities: warm dry without cyanosis clubbing or edema  Neuro: AAOx3, nonfocal  Psych: Normal affect and demeanor with intact judgement and insight   Data Reviewed: I have personally reviewed following labs and imaging studies  CBC: Recent Labs  Lab 10/18/18 1124  WBC 8.3  NEUTROABS 5.2  HGB 11.0*  HCT 33.2*  MCV 99.4  PLT 123456   Basic Metabolic Panel: Recent Labs  Lab 10/18/18 1124  NA 138  K 3.8  CL 104  CO2 27  GLUCOSE 122*  BUN 21  CREATININE 0.85  CALCIUM 9.0   GFR: Estimated Creatinine Clearance: 58.5 mL/min (by C-G formula based on SCr of 0.85 mg/dL). Liver Function Tests: No results for input(s): AST, ALT, ALKPHOS, BILITOT, PROT, ALBUMIN in the last 168 hours. No results for input(s): LIPASE, AMYLASE in the last 168 hours. No results for input(s): AMMONIA in the last 168 hours. Coagulation Profile: No results for input(s): INR, PROTIME in the last 168 hours. Cardiac Enzymes: No results for input(s): CKTOTAL, CKMB, CKMBINDEX, TROPONINI in the last 168 hours. BNP (last 3 results) No results for input(s): PROBNP in the last 8760 hours. HbA1C: No results for input(s): HGBA1C in the last 72 hours. CBG: No results for input(s): GLUCAP in the last 168 hours. Lipid Profile: No results for input(s): CHOL, HDL, LDLCALC, TRIG, CHOLHDL, LDLDIRECT in the last 72 hours. Thyroid Function Tests: No results for input(s): TSH, T4TOTAL, FREET4, T3FREE, THYROIDAB in the last 72 hours. Anemia Panel: No results for input(s): VITAMINB12, FOLATE, FERRITIN, TIBC, IRON, RETICCTPCT in the last 72 hours. Urine analysis: No results found for: COLORURINE, APPEARANCEUR, LABSPEC, PHURINE, GLUCOSEU, HGBUR, BILIRUBINUR,  KETONESUR, PROTEINUR, UROBILINOGEN, NITRITE, LEUKOCYTESUR Sepsis Labs: @LABRCNTIP (procalcitonin:4,lacticidven:4)  ) Recent Results (from the past 240 hour(s))  SARS Coronavirus 2 [facility] order, Performed in [facility] hospital lab) Nasopharyngeal Nasopharyngeal Swab     Status: None   Collection Time: 10/18/18  1:00 PM   Specimen: Nasopharyngeal Swab  Result Value Ref Range Status   SARS Coronavirus 2 NEGATIVE NEGATIVE Final    Comment: (NOTE) If result is NEGATIVE SARS-CoV-2 target nucleic acids are NOT DETECTED. The SARS-CoV-2 RNA is generally detectable in upper and lower  respiratory specimens during the acute phase of infection. The lowest  concentration of SARS-CoV-2 viral copies this assay can detect is 250  copies / mL. A negative result does not preclude SARS-CoV-2 infection  and should not be used as the sole basis for treatment or other  patient management decisions.  A negative result may occur with  improper specimen collection / handling, submission of specimen other  than nasopharyngeal swab, presence of viral mutation(s) within the  areas targeted by this assay, and inadequate number of viral copies  (<250 copies / mL). A negative result must be combined with clinical  observations, patient  history, and epidemiological information. If result is POSITIVE SARS-CoV-2 target nucleic acids are DETECTED. The SARS-CoV-2 RNA is generally detectable in upper and lower  respiratory specimens dur ing the acute phase of infection.  Positive  results are indicative of active infection with SARS-CoV-2.  Clinical  correlation with patient history and other diagnostic information is  necessary to determine patient infection status.  Positive results do  not rule out bacterial infection or co-infection with other viruses. If result is PRESUMPTIVE POSTIVE SARS-CoV-2 nucleic acids MAY BE PRESENT.   A presumptive positive result was obtained on the submitted specimen  and  confirmed on repeat testing.  While 2019 novel coronavirus  (SARS-CoV-2) nucleic acids may be present in the submitted sample  additional confirmatory testing may be necessary for epidemiological  and / or clinical management purposes  to differentiate between  SARS-CoV-2 and other Sarbecovirus currently known to infect humans.  If clinically indicated additional testing with an alternate test  methodology [phone]) is advised. The SARS-CoV-2 RNA is generally  detectable in upper and lower respiratory sp ecimens during the acute  phase of infection. The expected result is Negative. Fact Sheet for Patients:  StrictlyIdeas.no Fact Sheet for Healthcare Providers: BankingDealers.co.za This test is not yet approved or cleared by the [place] FDA and has been authorized for detection and/or diagnosis of SARS-CoV-2 by FDA under an Emergency Use Authorization (EUA).  This EUA will remain in effect (meaning this test can be used) for the duration of the COVID-19 declaration under Section 564(b)(1) of the Act, 21 U.S.C. section 360bbb-3(b)(1), unless the authorization is terminated or revoked sooner. Performed at [facility], [place] [address], [place], [place] [address]       Radiology Studies: Dg Lumbar Spine Complete  Result Date: 10/18/2018 CLINICAL DATA:  Fall with hip and back pain EXAM: LUMBAR SPINE - COMPLETE 4+ VIEW COMPARISON:  None available FINDINGS: No evidence of acute fracture.  No traumatic malalignment. Levoscoliosis and L4-5 anterolisthesis. Diffuse lumbar degenerative disc narrowing sparing L5-S1, with bulky endplate spurring. Degenerative facet spurring at L3-4 and below. Osteopenia and atherosclerosis IMPRESSION: 1. No acute finding. 2. 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 Electronically Signed   By: Monte Fantasia M.D.   On: 10/18/2018 06:37   Ct Head Wo Contrast  Result Date:  10/18/2018 CLINICAL DATA:  Syncopal episode. EXAM: CT HEAD WITHOUT CONTRAST TECHNIQUE: Contiguous axial images were obtained from the base of the skull through the vertex without intravenous contrast. COMPARISON:  Head CT 08/22/2007 and MRI brain 06/09/2016 FINDINGS: Brain: Stable age related cerebral atrophy, ventriculomegaly and periventricular white matter disease. No extra-axial fluid collections are identified. No CT findings for acute hemispheric infarction or intracranial hemorrhage. No mass lesions. The brainstem and cerebellum are normal. Vascular: Vascular calcifications but no aneurysm or hyperdense vessels. Skull: No skull fracture or bone lesions. Sinuses/Orbits: The paranasal sinuses and mastoid air cells are clear. Other: No scalp lesions or hematoma. IMPRESSION: 1. Stable appearing age related cerebral atrophy, ventriculomegaly and periventricular white matter disease. 2. No acute intracranial findings, mass lesion or skull fracture. Electronically Signed   By: Marijo Sanes M.D.   On: 10/18/2018 10:50   Ct Angio Chest Pe W And/or Wo Contrast  Result Date: 10/18/2018 CLINICAL DATA:  Fall with pelvis fracture.  Pain.  Positive D-dimer EXAM: CT ANGIOGRAPHY CHEST WITH CONTRAST TECHNIQUE: Multidetector CT imaging of the chest was performed using the standard protocol during bolus administration of intravenous contrast. Multiplanar CT image reconstructions  and MIPs were obtained to evaluate the vascular anatomy. CONTRAST:  7mL OMNIPAQUE IOHEXOL 350 MG/ML SOLN COMPARISON:  Chest radiograph October 18, 2018 FINDINGS: Cardiovascular: There is no demonstrable pulmonary embolus. There is no appreciable thoracic aortic aneurysm or dissection. The visualized great vessels show scattered foci of calcification. There are foci of aortic atherosclerosis. There are foci of coronary artery calcification. Rather minimal pericardial fluid may be within physiologic range. There is no pericardial thickening.  Mediastinum/Nodes: Thyroid is diminutive. No thyroid lesions demonstrable on this study. There is no evident thoracic adenopathy. There is a focal hiatal hernia. Lungs/Pleura: No pneumothorax. There is bibasilar atelectatic change. There is no evident edema or consolidation. On axial slice 47 series 6, there is a 1 mm nodular opacity in the anterior segment of the right upper lobe. On axial slice 84 series 6, there is a 2 mm nodular opacity in the periphery of the lateral segment of the right lower lobe. No appreciable pleural effusions. Upper Abdomen: There is upper abdominal aortic atherosclerosis. Visualized upper abdominal structures otherwise appear unremarkable. Musculoskeletal: There is anterior wedging of the T10 vertebral body, age uncertain. There is a nondisplaced fracture of the anterior inferior aspect of the T11 vertebral body. No blastic or lytic bone lesions. No chest wall lesions. Review of the MIP images confirms the above findings. IMPRESSION: 1. No demonstrable pulmonary embolus. No thoracic aortic aneurysm or dissection. There is aortic atherosclerosis. There are foci of great vessel and coronary artery calcification. 2. No pneumothorax. Areas of mild atelectatic change. Occasional 1-2 mm nodular opacities as noted. No follow-up needed if patient is low-risk (and has no known or suspected primary neoplasm). Non-contrast chest CT can be considered in 12 months if patient is high-risk. This recommendation follows the consensus statement: Guidelines for Management of Incidental Pulmonary Nodules Detected on CT Images: From the Fleischner Society 2017; Radiology 2017; 284:228-243. 3.  Hiatal hernia. 4. Anterior wedging of the T10 vertebral body. Fracture along the inferior aspect of T11 vertebral body. Given other trauma, these fractures may well be recent. 5.  No evident adenopathy. Aortic Atherosclerosis (ICD10-I70.0). Electronically Signed   By: Lowella Grip III M.D.   On: 10/18/2018 14:25    Dg Chest Portable 1 View  Result Date: 10/18/2018 CLINICAL DATA:  Hypoxia EXAM: PORTABLE CHEST 1 VIEW COMPARISON:  12/11/2017 FINDINGS: Heart and mediastinal contours are within normal limits. No focal opacities or effusions. No acute bony abnormality. IMPRESSION: No active disease. Electronically Signed   By: Rolm Baptise M.D.   On: 10/18/2018 11:48   Dg Hip Unilat W Or Wo Pelvis 2-3 Views Left  Result Date: 10/18/2018 CLINICAL DATA:  Fall with hip and back pain EXAM: DG HIP (WITH OR WITHOUT PELVIS) 2-3V LEFT COMPARISON:  12/17/2017 FINDINGS: Left hip arthroplasty. The femoral stem is completely seen only on the lateral view. No evidence of periprosthetic fracture or loosening. Suspect a left inferior pubic ramus fracture that is nondisplaced. IMPRESSION: Suspect a nondisplaced left inferior pubic ramus fracture. Electronically Signed   By: Monte Fantasia M.D.   On: 10/18/2018 06:35     Scheduled Meds:  enoxaparin (LOVENOX) injection  40 mg Subcutaneous Q24H   ezetimibe  10 mg Oral Daily   FLUoxetine  60 mg Oral Daily   lamoTRIgine  100 mg Oral Daily   levothyroxine  75 mcg Oral Q0600   QUEtiapine  300 mg Oral QHS   rosuvastatin  40 mg Oral Daily   senna  5 tablet Oral QHS  Continuous Infusions:   LOS: 0 days   Time Spent in minutes   30 minutes  Lillybeth Tal D.O. on 10/19/2018 at 9:58 AM  Between 7am to 7pm - Please see pager noted on [email or web address]  After 7pm go to [email or web address]  And look for the night coverage person covering for me after hours  [facility] Office  [phone]

## 2018-10-19 NOTE — Plan of Care (Signed)
  Problem: Pain Managment: Goal: General experience of comfort will improve Outcome: Progressing   

## 2018-10-19 NOTE — TOC Progression Note (Addendum)
Transition of Care [facility]) - Progression Note    Patient Details  Name: Shari Horn MRN: [id number] Date of Birth: 21-Mar-1940  Transition of Care [facility]) CM/SW [place], [place] Phone Number: 10/19/2018, 3:33 PM  Clinical Narrative:    UPDATE: CSW called [facility] per patient's request- they have no available beds anytime soon, CSW left voice message with [facility]. CSW waiting on call back   CSW visit with the patient at bedside. CSW informed patient of only one  bed offer. Patient wanted [facility] but they have no availability. Patient gave CSW permission to add more SNFs to her search.   Patient's  PASRR number need for SNF remains under review.  CSW will continue to follow and assist with discharge planning.   Thurmond Butts, MSW, [facility] Clinical Social Worker [phone]   Expected Discharge Plan: Skilled Nursing Facility Barriers to Discharge: Insurance Authorization, SNF Pending bed offer, Awaiting State Approval (PASRR)  Expected Discharge Plan and Services Expected Discharge Plan: [place] In-house Referral: Clinical Social Work   Post Acute Care Choice: [place] Living arrangements for the past 2 months: Single Family Home Expected Discharge Date: 10/20/18                                     Social Determinants of Health (SDOH) Interventions    Readmission Risk Interventions No flowsheet data found.

## 2018-10-19 NOTE — Progress Notes (Signed)
Patient is on room air and O2 is at 93%.

## 2018-10-20 DIAGNOSIS — E785 Hyperlipidemia, unspecified: Secondary | ICD-10-CM | POA: Diagnosis not present

## 2018-10-20 DIAGNOSIS — K5900 Constipation, unspecified: Secondary | ICD-10-CM | POA: Diagnosis present

## 2018-10-20 DIAGNOSIS — M6281 Muscle weakness (generalized): Secondary | ICD-10-CM | POA: Diagnosis not present

## 2018-10-20 DIAGNOSIS — Z20828 Contact with and (suspected) exposure to other viral communicable diseases: Secondary | ICD-10-CM | POA: Diagnosis present

## 2018-10-20 DIAGNOSIS — R911 Solitary pulmonary nodule: Secondary | ICD-10-CM | POA: Diagnosis not present

## 2018-10-20 DIAGNOSIS — Y92003 Bedroom of unspecified non-institutional (private) residence as the place of occurrence of the external cause: Secondary | ICD-10-CM | POA: Diagnosis not present

## 2018-10-20 DIAGNOSIS — I2510 Atherosclerotic heart disease of native coronary artery without angina pectoris: Secondary | ICD-10-CM | POA: Diagnosis present

## 2018-10-20 DIAGNOSIS — Z23 Encounter for immunization: Secondary | ICD-10-CM | POA: Diagnosis not present

## 2018-10-20 DIAGNOSIS — E038 Other specified hypothyroidism: Secondary | ICD-10-CM | POA: Diagnosis not present

## 2018-10-20 DIAGNOSIS — R262 Difficulty in walking, not elsewhere classified: Secondary | ICD-10-CM | POA: Diagnosis not present

## 2018-10-20 DIAGNOSIS — R279 Unspecified lack of coordination: Secondary | ICD-10-CM | POA: Diagnosis not present

## 2018-10-20 DIAGNOSIS — E039 Hypothyroidism, unspecified: Secondary | ICD-10-CM | POA: Diagnosis present

## 2018-10-20 DIAGNOSIS — Z79890 Hormone replacement therapy (postmenopausal): Secondary | ICD-10-CM | POA: Diagnosis not present

## 2018-10-20 DIAGNOSIS — I25119 Atherosclerotic heart disease of native coronary artery with unspecified angina pectoris: Secondary | ICD-10-CM | POA: Diagnosis not present

## 2018-10-20 DIAGNOSIS — R0902 Hypoxemia: Secondary | ICD-10-CM | POA: Diagnosis not present

## 2018-10-20 DIAGNOSIS — W19XXXA Unspecified fall, initial encounter: Secondary | ICD-10-CM | POA: Diagnosis not present

## 2018-10-20 DIAGNOSIS — R296 Repeated falls: Secondary | ICD-10-CM | POA: Diagnosis present

## 2018-10-20 DIAGNOSIS — Z743 Need for continuous supervision: Secondary | ICD-10-CM | POA: Diagnosis not present

## 2018-10-20 DIAGNOSIS — Z87891 Personal history of nicotine dependence: Secondary | ICD-10-CM | POA: Diagnosis not present

## 2018-10-20 DIAGNOSIS — W1839XA Other fall on same level, initial encounter: Secondary | ICD-10-CM | POA: Diagnosis present

## 2018-10-20 DIAGNOSIS — S22089A Unspecified fracture of T11-T12 vertebra, initial encounter for closed fracture: Secondary | ICD-10-CM | POA: Diagnosis present

## 2018-10-20 DIAGNOSIS — F3181 Bipolar II disorder: Secondary | ICD-10-CM | POA: Diagnosis present

## 2018-10-20 DIAGNOSIS — Z96642 Presence of left artificial hip joint: Secondary | ICD-10-CM | POA: Diagnosis present

## 2018-10-20 DIAGNOSIS — Z79899 Other long term (current) drug therapy: Secondary | ICD-10-CM | POA: Diagnosis not present

## 2018-10-20 DIAGNOSIS — Y92009 Unspecified place in unspecified non-institutional (private) residence as the place of occurrence of the external cause: Secondary | ICD-10-CM | POA: Diagnosis not present

## 2018-10-20 DIAGNOSIS — K219 Gastro-esophageal reflux disease without esophagitis: Secondary | ICD-10-CM | POA: Diagnosis present

## 2018-10-20 DIAGNOSIS — M4840XD Fatigue fracture of vertebra, site unspecified, subsequent encounter for fracture with routine healing: Secondary | ICD-10-CM | POA: Diagnosis not present

## 2018-10-20 DIAGNOSIS — S32502D Unspecified fracture of left pubis, subsequent encounter for fracture with routine healing: Secondary | ICD-10-CM | POA: Diagnosis not present

## 2018-10-20 DIAGNOSIS — R55 Syncope and collapse: Secondary | ICD-10-CM | POA: Diagnosis present

## 2018-10-20 DIAGNOSIS — R918 Other nonspecific abnormal finding of lung field: Secondary | ICD-10-CM | POA: Diagnosis present

## 2018-10-20 DIAGNOSIS — S32592A Other specified fracture of left pubis, initial encounter for closed fracture: Secondary | ICD-10-CM | POA: Diagnosis present

## 2018-10-20 DIAGNOSIS — F419 Anxiety disorder, unspecified: Secondary | ICD-10-CM | POA: Diagnosis present

## 2018-10-20 DIAGNOSIS — S329XXA Fracture of unspecified parts of lumbosacral spine and pelvis, initial encounter for closed fracture: Secondary | ICD-10-CM | POA: Diagnosis present

## 2018-10-20 MED ORDER — BISACODYL 5 MG PO TBEC: 5.0000 mg | DELAYED_RELEASE_TABLET | Freq: Every day | ORAL | Status: DC | PRN

## 2018-10-20 MED ORDER — SENNOSIDES-DOCUSATE SODIUM 8.6-50 MG PO TABS
1.0000 | ORAL_TABLET | Freq: Two times a day (BID) | ORAL | Status: DC
  Administered 2018-10-20 – 2018-10-22 (×5): 1 via ORAL
  Filled 2018-10-20 (×5): qty 1

## 2018-10-20 NOTE — TOC Progression Note (Signed)
Transition of Care [facility]) - Progression Note    Patient Details  Name: Shari Horn MRN: [id number] Date of Birth: Nov 28, 1940  Transition of Care [facility]) CM/SW [place], [place] Phone Number: 10/20/2018, 1:19 PM  Clinical Narrative:     Patient insurance authorization and PSARR remains pending   Thurmond Butts, MSW, [facility] Clinical Social Worker [phone]   Expected Discharge Plan: [place] Barriers to Discharge: Insurance Authorization, SNF Pending bed offer, Awaiting State Approval (PASRR)  Expected Discharge Plan and Services Expected Discharge Plan: [place] In-house Referral: Clinical Social Work   Post Acute Care Choice: [place] Living arrangements for the past 2 months: Single Family Home Expected Discharge Date: 10/20/18                                     Social Determinants of Health (SDOH) Interventions    Readmission Risk Interventions No flowsheet data found.

## 2018-10-20 NOTE — Progress Notes (Signed)
PROGRESS NOTE    Shari Horn  [id number] DOB: 12-Nov-1940 DOA: 10/18/2018 PCP: Vivi Barrack, MD   Brief Narrative:  HPI On 10/18/2018 by Dr. Shelly Coss Shari Horn is a 78 y.o. female with medical history significant of hyperlipidemia, carotid artery  disease, hypothyroidism, bipolar disorder,PVCs  who presents from home with fall.  Patient was apparently all right till last night.  Last night at 2:30 AM she woke up and tried to close the door of her bedroom and while on returning to the bed she tried to grab the post but could not reach and fell on the ground.  After the fall she experienced pain on the left hip and was unable to walk.  She was brought to the emergency department this morning.  Patient has history of falls in the past and had left hip fracture last year in November and underwent left hip replacement.  She lives alone in house on the ground floor. Patient seen and examined the bedside in the emergency department.  During my evaluation, she was resting comfortably on the bed.  She did not complain of any pain while lying on the bed but complains of pain when she tries to stand on her feet.  She denies any chest pain, shortness of breath, palpitations, headache, nausea, vomiting, abdominal pain, dysuria.She was hemodynamically stable.  Interim history Admitted with pubic ramus fracture due to fall. Pending PASRR and SNF.   Assessment & Plan   Left inferior pubic ramus fracture secondary to fall -Patient with history of fall and left hip fracture last year November, underwent left hip replacement -Patient noted to have nondisplaced left inferior pubic ramus fracture on x-ray -PT and OT recommending SNF -Continue pain control- added IV morphine PRN for severe pain  T11 vertebral body fracture -Noted on imaging however patient does not complain of pain -Manage conservatively  Hypothyroidism -Continue Synthroid  Coronary artery disease -Patient follows with  cardiology, continue statin  Hyperlipidemia -Continue statin, Zetia  Bipolar disorder -Stable, continue Lamictal, Seroquel  Anxiety and depression -continue Prozac and clonazepam  Pulmonary nodules -Occasional 1 to 2 mm nodular opacity seen on CT chest.  Noncontrast CT recommended 12 months for follow-up as she is a former smoker  Hypoxia -Currently on oxygen.  No history of oxygen use at home -Chest x-ray unremarkable for infection -CTA chest showed no PE.  No thoracic aortic aneurysm or dissection.  There is aortic atherosclerosis.  Foci of great vessel and coronary artery calcification.  No pneumothorax. -Continue incentive spirometry -it appears that patient's oxygen saturations fall into the 80s when sleeping- she will need a sleep study  DVT Prophylaxis   Lovenox  Code Status: Full  Family Communication: None at bedside  Disposition Plan: Admitted. Given that patient continues to have pain with minimal movement and hypoxia, patient will need to stay in the hospital until SNF placement. She has had similar fall in the past which resulted in hip fracture. She lives alone therefore discharge to home would not safe.  Consultants None  Procedures  None  Antibiotics   Anti-infectives (From admission, onward)   None      Subjective:   Shari Horn seen and examined today.  Denies chest pain, shortness breath.  Complains of abdominal pain, more on the right side, and constipation.  Denies current nausea or vomiting, dizziness or headache. Continues to complain of pain in her left leg especially with movement.  Objective:   Vitals:   10/19/18 0815 10/19/18  [place] 10/19/18 1958 10/20/18 0408  BP: 127/71 (!) 114/51 (!) 103/92 104/66  Pulse: 82 78 76 77  Resp: 17 16 16 16   Temp: 98.1 F (36.7 C) 98 F (36.7 C) 98.2 F (36.8 C) 98.2 F (36.8 C)  TempSrc: Oral Oral Oral Oral  SpO2: 97% 100% 95% 98%  Weight:      Height:        Intake/Output Summary (Last 24 hours) at  10/20/2018 1039 Last data filed at 10/19/2018 1700 Gross per 24 hour  Intake 480 ml  Output -  Net 480 ml   Filed Weights   10/18/18 1811  Weight: 77.3 kg   Exam  General: Well developed, well nourished, NAD, appears stated age  50: NCAT, mucous membranes moist.   Cardiovascular: S1 S2 auscultated, RRR, no murmur  Respiratory: Clear to auscultation bilaterally  Abdomen: Soft, mild RUQ TTP, nondistended, + bowel sounds  Extremities: warm dry without cyanosis clubbing or edema  Neuro: AAOx3, nonfocal  Psych: Appropriate mood and affect, pleasant   Data Reviewed: I have personally reviewed following labs and imaging studies  CBC: Recent Labs  Lab 10/18/18 1124  WBC 8.3  NEUTROABS 5.2  HGB 11.0*  HCT 33.2*  MCV 99.4  PLT 123456   Basic Metabolic Panel: Recent Labs  Lab 10/18/18 1124  NA 138  K 3.8  CL 104  CO2 27  GLUCOSE 122*  BUN 21  CREATININE 0.85  CALCIUM 9.0   GFR: Estimated Creatinine Clearance: 58.5 mL/min (by C-G formula based on SCr of 0.85 mg/dL). Liver Function Tests: No results for input(s): AST, ALT, ALKPHOS, BILITOT, PROT, ALBUMIN in the last 168 hours. No results for input(s): LIPASE, AMYLASE in the last 168 hours. No results for input(s): AMMONIA in the last 168 hours. Coagulation Profile: No results for input(s): INR, PROTIME in the last 168 hours. Cardiac Enzymes: No results for input(s): CKTOTAL, CKMB, CKMBINDEX, TROPONINI in the last 168 hours. BNP (last 3 results) No results for input(s): PROBNP in the last 8760 hours. HbA1C: No results for input(s): HGBA1C in the last 72 hours. CBG: No results for input(s): GLUCAP in the last 168 hours. Lipid Profile: No results for input(s): CHOL, HDL, LDLCALC, TRIG, CHOLHDL, LDLDIRECT in the last 72 hours. Thyroid Function Tests: No results for input(s): TSH, T4TOTAL, FREET4, T3FREE, THYROIDAB in the last 72 hours. Anemia Panel: No results for input(s): VITAMINB12, FOLATE, FERRITIN, TIBC,  IRON, RETICCTPCT in the last 72 hours. Urine analysis: No results found for: COLORURINE, APPEARANCEUR, LABSPEC, PHURINE, GLUCOSEU, HGBUR, BILIRUBINUR, KETONESUR, PROTEINUR, UROBILINOGEN, NITRITE, LEUKOCYTESUR Sepsis Labs: @LABRCNTIP (procalcitonin:4,lacticidven:4)  ) Recent Results (from the past 240 hour(s))  SARS Coronavirus 2 [facility] order, Performed in [facility] hospital lab) Nasopharyngeal Nasopharyngeal Swab     Status: None   Collection Time: 10/18/18  1:00 PM   Specimen: Nasopharyngeal Swab  Result Value Ref Range Status   SARS Coronavirus 2 NEGATIVE NEGATIVE Final    Comment: (NOTE) If result is NEGATIVE SARS-CoV-2 target nucleic acids are NOT DETECTED. The SARS-CoV-2 RNA is generally detectable in upper and lower  respiratory specimens during the acute phase of infection. The lowest  concentration of SARS-CoV-2 viral copies this assay can detect is 250  copies / mL. A negative result does not preclude SARS-CoV-2 infection  and should not be used as the sole basis for treatment or other  patient management decisions.  A negative result may occur with  improper specimen collection / handling, submission of specimen other  than nasopharyngeal swab,  presence of viral mutation(s) within the  areas targeted by this assay, and inadequate number of viral copies  (<250 copies / mL). A negative result must be combined with clinical  observations, patient history, and epidemiological information. If result is POSITIVE SARS-CoV-2 target nucleic acids are DETECTED. The SARS-CoV-2 RNA is generally detectable in upper and lower  respiratory specimens dur ing the acute phase of infection.  Positive  results are indicative of active infection with SARS-CoV-2.  Clinical  correlation with patient history and other diagnostic information is  necessary to determine patient infection status.  Positive results do  not rule out bacterial infection or co-infection with other viruses. If  result is PRESUMPTIVE POSTIVE SARS-CoV-2 nucleic acids MAY BE PRESENT.   A presumptive positive result was obtained on the submitted specimen  and confirmed on repeat testing.  While 2019 novel coronavirus  (SARS-CoV-2) nucleic acids may be present in the submitted sample  additional confirmatory testing may be necessary for epidemiological  and / or clinical management purposes  to differentiate between  SARS-CoV-2 and other Sarbecovirus currently known to infect humans.  If clinically indicated additional testing with an alternate test  methodology [phone]) is advised. The SARS-CoV-2 RNA is generally  detectable in upper and lower respiratory sp ecimens during the acute  phase of infection. The expected result is Negative. Fact Sheet for Patients:  StrictlyIdeas.no Fact Sheet for Healthcare Providers: BankingDealers.co.za This test is not yet approved or cleared by the [place] FDA and has been authorized for detection and/or diagnosis of SARS-CoV-2 by FDA under an Emergency Use Authorization (EUA).  This EUA will remain in effect (meaning this test can be used) for the duration of the COVID-19 declaration under Section 564(b)(1) of the Act, 21 U.S.C. section 360bbb-3(b)(1), unless the authorization is terminated or revoked sooner. Performed at [facility], [place] [address], [place], [place] [address]       Radiology Studies: Ct Head Wo Contrast  Result Date: 10/18/2018 CLINICAL DATA:  Syncopal episode. EXAM: CT HEAD WITHOUT CONTRAST TECHNIQUE: Contiguous axial images were obtained from the base of the skull through the vertex without intravenous contrast. COMPARISON:  Head CT 08/22/2007 and MRI brain 06/09/2016 FINDINGS: Brain: Stable age related cerebral atrophy, ventriculomegaly and periventricular white matter disease. No extra-axial fluid collections are identified. No CT findings for acute hemispheric infarction or  intracranial hemorrhage. No mass lesions. The brainstem and cerebellum are normal. Vascular: Vascular calcifications but no aneurysm or hyperdense vessels. Skull: No skull fracture or bone lesions. Sinuses/Orbits: The paranasal sinuses and mastoid air cells are clear. Other: No scalp lesions or hematoma. IMPRESSION: 1. Stable appearing age related cerebral atrophy, ventriculomegaly and periventricular white matter disease. 2. No acute intracranial findings, mass lesion or skull fracture. Electronically Signed   By: Marijo Sanes M.D.   On: 10/18/2018 10:50   Ct Angio Chest Pe W And/or Wo Contrast  Result Date: 10/18/2018 CLINICAL DATA:  Fall with pelvis fracture.  Pain.  Positive D-dimer EXAM: CT ANGIOGRAPHY CHEST WITH CONTRAST TECHNIQUE: Multidetector CT imaging of the chest was performed using the standard protocol during bolus administration of intravenous contrast. Multiplanar CT image reconstructions and MIPs were obtained to evaluate the vascular anatomy. CONTRAST:  45mL OMNIPAQUE IOHEXOL 350 MG/ML SOLN COMPARISON:  Chest radiograph October 18, 2018 FINDINGS: Cardiovascular: There is no demonstrable pulmonary embolus. There is no appreciable thoracic aortic aneurysm or dissection. The visualized great vessels show scattered foci of calcification. There are foci of aortic atherosclerosis. There are foci  of coronary artery calcification. Rather minimal pericardial fluid may be within physiologic range. There is no pericardial thickening. Mediastinum/Nodes: Thyroid is diminutive. No thyroid lesions demonstrable on this study. There is no evident thoracic adenopathy. There is a focal hiatal hernia. Lungs/Pleura: No pneumothorax. There is bibasilar atelectatic change. There is no evident edema or consolidation. On axial slice 47 series 6, there is a 1 mm nodular opacity in the anterior segment of the right upper lobe. On axial slice 84 series 6, there is a 2 mm nodular opacity in the periphery of the lateral  segment of the right lower lobe. No appreciable pleural effusions. Upper Abdomen: There is upper abdominal aortic atherosclerosis. Visualized upper abdominal structures otherwise appear unremarkable. Musculoskeletal: There is anterior wedging of the T10 vertebral body, age uncertain. There is a nondisplaced fracture of the anterior inferior aspect of the T11 vertebral body. No blastic or lytic bone lesions. No chest wall lesions. Review of the MIP images confirms the above findings. IMPRESSION: 1. No demonstrable pulmonary embolus. No thoracic aortic aneurysm or dissection. There is aortic atherosclerosis. There are foci of great vessel and coronary artery calcification. 2. No pneumothorax. Areas of mild atelectatic change. Occasional 1-2 mm nodular opacities as noted. No follow-up needed if patient is low-risk (and has no known or suspected primary neoplasm). Non-contrast chest CT can be considered in 12 months if patient is high-risk. This recommendation follows the consensus statement: Guidelines for Management of Incidental Pulmonary Nodules Detected on CT Images: From the Fleischner Society 2017; Radiology 2017; 284:228-243. 3.  Hiatal hernia. 4. Anterior wedging of the T10 vertebral body. Fracture along the inferior aspect of T11 vertebral body. Given other trauma, these fractures may well be recent. 5.  No evident adenopathy. Aortic Atherosclerosis (ICD10-I70.0). Electronically Signed   By: Lowella Grip III M.D.   On: 10/18/2018 14:25   Dg Chest Portable 1 View  Result Date: 10/18/2018 CLINICAL DATA:  Hypoxia EXAM: PORTABLE CHEST 1 VIEW COMPARISON:  12/11/2017 FINDINGS: Heart and mediastinal contours are within normal limits. No focal opacities or effusions. No acute bony abnormality. IMPRESSION: No active disease. Electronically Signed   By: Rolm Baptise M.D.   On: 10/18/2018 11:48     Scheduled Meds: . enoxaparin (LOVENOX) injection  40 mg Subcutaneous Q24H  . ezetimibe  10 mg Oral Daily  .  FLUoxetine  60 mg Oral Daily  . lamoTRIgine  100 mg Oral Daily  . levothyroxine  75 mcg Oral Q0600  . QUEtiapine  300 mg Oral QHS  . rosuvastatin  40 mg Oral Daily  . senna-docusate  1 tablet Oral BID   Continuous Infusions:   LOS: 0 days   Time Spent in minutes   30 minutes  Amay Mijangos D.O. on 10/20/2018 at 10:39 AM  Between 7am to 7pm - Please see pager noted on [email or web address]  After 7pm go to [email or web address]  And look for the night coverage person covering for me after hours  [facility] Office  [phone]

## 2018-10-20 NOTE — Progress Notes (Signed)
Took oxygen off and used incentive and patients 02 levels remained in high 90s. Once she went to sleep it dropped between 83-88%. Had to be placed back on 2L [place]. Encouraged to keep using incentive, got to 1750.

## 2018-10-21 MED ORDER — INFLUENZA VAC A&B SA ADJ QUAD 0.5 ML IM PRSY
0.5000 mL | PREFILLED_SYRINGE | INTRAMUSCULAR | Status: AC
  Administered 2018-10-22: 10:00:00 0.5 mL via INTRAMUSCULAR
  Filled 2018-10-21: qty 0.5

## 2018-10-21 NOTE — TOC Progression Note (Signed)
Transition of Care [facility]) - Progression Note    Patient Details  Name: SCOTTI KIDDOO MRN: [id number] Date of Birth: 01/20/1941  Transition of Care [facility]) CM/SW [place], [place] Phone Number: 10/21/2018, 2:49 PM  Clinical Narrative:     CSW called patient's insurance [employer] which is now managed by [facility]- patient insurance is pending- CSW faxed updated clinicals for review. PASRR requested additional information today, will have MD signed 30-day note.   CSW visit with the patient at bedside. CSW provided updated on SNF placement-  CW advised [facility], [facility], and [facility] do not have any availability. Patient did not want [facility] and selected [facility].   Thurmond Butts, MSW, [facility] Clinical Social Worker [phone]    Expected Discharge Plan: Skilled Nursing Facility Barriers to Discharge: Insurance Authorization, SNF Pending bed offer, Awaiting State Approval (PASRR)  Expected Discharge Plan and Services Expected Discharge Plan: [place] In-house Referral: Clinical Social Work   Post Acute Care Choice: [place] Living arrangements for the past 2 months: Single Family Home Expected Discharge Date: 10/20/18                                     Social Determinants of Health (SDOH) Interventions    Readmission Risk Interventions No flowsheet data found.

## 2018-10-21 NOTE — Progress Notes (Signed)
Physical Therapy Treatment Patient Details Name: Shari Horn MRN: [id number] DOB: 1941/01/12 Today's Date: 10/21/2018    History of Present Illness Pt is a 78 y/o female presenting to the ED following a fall. Found to have L inferior pubic rami fx. PMH includes CAD, bipolar disorder, vertigo, and L THA.    PT Comments    Pt progression well this am.  She is able to progress to gt with 9/10 pain although her expression does not match her rating.  Pt did require close chair follow as she continues to fatigue quickly.  Plan for SNF remains appropriate as she is still requiring min assistance overall.  Plan for continued progression of function as she remains hospitalized.     Follow Up Recommendations  SNF     Equipment Recommendations  None recommended by PT    Recommendations for Other Services       Precautions / Restrictions Precautions Precautions: Fall Restrictions Weight Bearing Restrictions: Yes LLE Weight Bearing: Weight bearing as tolerated Other Position/Activity Restrictions: WBAT per PA    Mobility  Bed Mobility Overal bed mobility: Needs Assistance Bed Mobility: Supine to Sit     Supine to sit: Min assist     General bed mobility comments: Min assistance via flat spin to edge of bed.  Transfers Overall transfer level: Needs assistance Equipment used: Rolling walker (2 wheeled) Transfers: Sit to/from Stand Sit to Stand: Min assist         General transfer comment: Cues for hand placement, forward weight shifting and pushing through B LEs.  Pt flexed in LEs and required increased time to rise.  Ambulation/Gait Ambulation/Gait assistance: Min guard Gait Distance (Feet): 40 Feet Assistive device: Rolling walker (2 wheeled) Gait Pattern/deviations: Step-to pattern;Trunk flexed;Antalgic;Decreased stride length     General Gait Details: Cues for sequencing and use of B UEs to offset weight in L stance phase.   Stairs             Wheelchair  Mobility    Modified Rankin (Stroke Patients Only)       Balance Overall balance assessment: Needs assistance Sitting-balance support: Bilateral upper extremity supported;Feet unsupported Sitting balance-Leahy Scale: Fair       Standing balance-Leahy Scale: Poor                              Cognition Arousal/Alertness: Awake/alert Behavior During Therapy: WFL for tasks assessed/performed Overall Cognitive Status: Within Functional Limits for tasks assessed                                        Exercises      General Comments        Pertinent Vitals/Pain Pain Assessment: 0-10 Pain Score: 4  Pain Location: L groin/pelvis area Pain Descriptors / Indicators: Grimacing;Guarding Pain Intervention(s): Monitored during session;Repositioned    Home Living                      Prior Function            PT Goals (current goals can now be found in the care plan section) Acute Rehab PT Goals Patient Stated Goal: to be more independent and decrease pain Potential to Achieve Goals: Good Progress towards PT goals: Progressing toward goals    Frequency    Min 2X/week  PT Plan Current plan remains appropriate    Co-evaluation              AM-PAC PT "6 Clicks" Mobility   Outcome Measure  Help needed turning from your back to your side while in a flat bed without using bedrails?: A Little Help needed moving from lying on your back to sitting on the side of a flat bed without using bedrails?: A Little Help needed moving to and from a bed to a chair (including a wheelchair)?: A Little Help needed standing up from a chair using your arms (e.g., wheelchair or bedside chair)?: A Little Help needed to walk in hospital room?: A Little Help needed climbing 3-5 steps with a railing? : A Lot 6 Click Score: 17    End of Session Equipment Utilized During Treatment: Gait belt Activity Tolerance: Patient limited by pain Patient  left: in bed;with call bell/phone within reach Nurse Communication: Mobility status PT Visit Diagnosis: Muscle weakness (generalized) (M62.81);History of falling (Z91.81);Difficulty in walking, not elsewhere classified (R26.2);Pain Pain - Right/Left: Left     Time: [id number] PT Time Calculation (min) (ACUTE ONLY): 18 min  Charges:  $Gait Training: 8-22 mins                     Governor Rooks, PTA Acute Rehabilitation Services Pager [phone] Office [phone]     Cristela Blue 10/21/2018, 10:07 AM

## 2018-10-21 NOTE — TOC Progression Note (Signed)
Transition of Care [facility]) - Progression Note    Patient Details  Name: Shari Horn MRN: [id number] Date of Birth: Dec 28, 1940  Transition of Care [facility]) CM/SW [place], [place] Phone Number: 10/21/2018, 6:02 PM  Clinical Narrative:     CSW received call form [place] insurance approved SNF-  Reference number [id number]. Authorization number will be provided to SNF.   CSW called and updated : [place], received authorization. CSW advised by SNF NO new COVID test is required.   CSW called and updated patient and MD.  Thurmond Butts, MSW, [facility] Clinical Social Worker [phone]    Expected Discharge Plan: [place] Barriers to Discharge: Insurance Authorization, SNF Pending bed offer, Awaiting State Approval (PASRR)  Expected Discharge Plan and Services Expected Discharge Plan: [place] In-house Referral: Clinical Social Work   Post Acute Care Choice: [place] arrangements for the past 2 months: Single Family Home Expected Discharge Date: 10/20/18                                     Social Determinants of Health (SDOH) Interventions    Readmission Risk Interventions No flowsheet data found.

## 2018-10-21 NOTE — Progress Notes (Signed)
PROGRESS NOTE    Shari Horn  [id number] DOB: January 07, 1941 DOA: 10/18/2018 PCP: Vivi Barrack, MD   Brief Narrative:  HPI On 10/18/2018 by Dr. Shelly Coss Shari Horn is a 78 y.o. female with medical history significant of hyperlipidemia, carotid artery  disease, hypothyroidism, bipolar disorder,PVCs  who presents from home with fall.  Patient was apparently all right till last night.  Last night at 2:30 AM she woke up and tried to close the door of her bedroom and while on returning to the bed she tried to grab the post but could not reach and fell on the ground.  After the fall she experienced pain on the left hip and was unable to walk.  She was brought to the emergency department this morning.  Patient has history of falls in the past and had left hip fracture last year in November and underwent left hip replacement.  She lives alone in house on the ground floor. Patient seen and examined the bedside in the emergency department.  During my evaluation, she was resting comfortably on the bed.  She did not complain of any pain while lying on the bed but complains of pain when she tries to stand on her feet.  She denies any chest pain, shortness of breath, palpitations, headache, nausea, vomiting, abdominal pain, dysuria.She was hemodynamically stable.  Interim history Admitted with pubic ramus fracture due to fall. Pending PASRR and SNF.   Assessment & Plan   Left inferior pubic ramus fracture secondary to fall -Patient with history of fall and left hip fracture last year November, underwent left hip replacement -Patient noted to have nondisplaced left inferior pubic ramus fracture on x-ray -PT and OT recommending SNF -Continue pain control- added IV morphine PRN for severe pain  T11 vertebral body fracture -Noted on imaging however patient does not complain of pain -Manage conservatively  Hypothyroidism -Continue Synthroid  Coronary artery disease -Patient follows with  cardiology, continue statin  Hyperlipidemia -Continue statin, Zetia  Bipolar disorder -Stable, continue Lamictal, Seroquel  Anxiety and depression -continue Prozac and clonazepam  Pulmonary nodules -Occasional 1 to 2 mm nodular opacity seen on CT chest.  Noncontrast CT recommended 12 months for follow-up as she is a former smoker  Hypoxia -Currently on oxygen.  No history of oxygen use at home -Chest x-ray unremarkable for infection -CTA chest showed no PE.  No thoracic aortic aneurysm or dissection.  There is aortic atherosclerosis.  Foci of great vessel and coronary artery calcification.  No pneumothorax. -Continue incentive spirometry -it appears that patient's oxygen saturations fall into the 80s when sleeping- she will need a sleep study; at rest and with speaking, oxygenation remains in the 90s  Constipation -resolved, patient had prune juice and sennakot  DVT Prophylaxis   Lovenox  Code Status: Full  Family Communication: None at bedside  Disposition Plan: Admitted. Given that patient continues to have pain with minimal movement and hypoxia, patient will need to stay in the hospital until SNF placement. She has had similar fall in the past which resulted in hip fracture. She lives alone therefore discharge to home would not safe.  Consultants None  Procedures  None  Antibiotics   Anti-infectives (From admission, onward)   None      Subjective:   Shari Horn seen and examined today.  Complains of not having assistance to the bathroom yesterday (she had called out 30 minutes prior to aide coming into the room). She was able to have a bowel  movement yesterday. Denies current abdominal pain. Denies chest pain, shortness of breath, nausea, vomiting, dizziness, or headache.   Objective:   Vitals:   10/20/18 1832 10/20/18 2030 10/21/18 0500 10/21/18 0743  BP: (!) 143/68 137/61 (!) 120/50 107/60  Pulse: 96 79 75 79  Resp:  18 16 16   Temp: 98.2 F (36.8 C) 99.3 F  (37.4 C) 97.7 F (36.5 C) 98 F (36.7 C)  TempSrc: Oral Oral Oral Oral  SpO2: 93% 90% 94% 90%  Weight:      Height:        Intake/Output Summary (Last 24 hours) at 10/21/2018 [id number] Last data filed at 10/20/2018 2100 Gross per 24 hour  Intake 480 ml  Output -  Net 480 ml   Filed Weights   10/18/18 1811  Weight: 77.3 kg   Exam  General: Well developed, well nourished, NAD, appears stated age  HEENT: NCAT, mucous membranes moist.   Cardiovascular: S1 S2 auscultated, RRR, no murmur  Respiratory: Clear to auscultation bilaterally with equal chest rise  Abdomen: Soft, nontender, nondistended, + bowel sounds  Extremities: warm dry without cyanosis clubbing or edema  Neuro: AAOx3, nonfocal  Psych: appropriate mood and affect, pleasant   Data Reviewed: I have personally reviewed following labs and imaging studies  CBC: Recent Labs  Lab 10/18/18 1124  WBC 8.3  NEUTROABS 5.2  HGB 11.0*  HCT 33.2*  MCV 99.4  PLT [id number]   Basic Metabolic Panel: Recent Labs  Lab 10/18/18 1124  NA 138  K 3.8  CL 104  CO2 27  GLUCOSE 122*  BUN 21  CREATININE 0.85  CALCIUM 9.0   GFR: Estimated Creatinine Clearance: 58.5 mL/min (by C-G formula based on SCr of 0.85 mg/dL). Liver Function Tests: No results for input(s): AST, ALT, ALKPHOS, BILITOT, PROT, ALBUMIN in the last 168 hours. No results for input(s): LIPASE, AMYLASE in the last 168 hours. No results for input(s): AMMONIA in the last 168 hours. Coagulation Profile: No results for input(s): INR, PROTIME in the last 168 hours. Cardiac Enzymes: No results for input(s): CKTOTAL, CKMB, CKMBINDEX, TROPONINI in the last 168 hours. BNP (last 3 results) No results for input(s): PROBNP in the last 8760 hours. HbA1C: No results for input(s): HGBA1C in the last 72 hours. CBG: No results for input(s): GLUCAP in the last 168 hours. Lipid Profile: No results for input(s): CHOL, HDL, LDLCALC, TRIG, CHOLHDL, LDLDIRECT in the last 72  hours. Thyroid Function Tests: No results for input(s): TSH, T4TOTAL, FREET4, T3FREE, THYROIDAB in the last 72 hours. Anemia Panel: No results for input(s): VITAMINB12, FOLATE, FERRITIN, TIBC, IRON, RETICCTPCT in the last 72 hours. Urine analysis: No results found for: COLORURINE, APPEARANCEUR, LABSPEC, PHURINE, GLUCOSEU, HGBUR, BILIRUBINUR, KETONESUR, PROTEINUR, UROBILINOGEN, NITRITE, LEUKOCYTESUR Sepsis Labs: @LABRCNTIP (procalcitonin:4,lacticidven:4)  ) Recent Results (from the past 240 hour(s))  SARS Coronavirus 2 [facility] order, Performed in [facility] hospital lab) Nasopharyngeal Nasopharyngeal Swab     Status: None   Collection Time: 10/18/18  1:00 PM   Specimen: Nasopharyngeal Swab  Result Value Ref Range Status   SARS Coronavirus 2 NEGATIVE NEGATIVE Final    Comment: (NOTE) If result is NEGATIVE SARS-CoV-2 target nucleic acids are NOT DETECTED. The SARS-CoV-2 RNA is generally detectable in upper and lower  respiratory specimens during the acute phase of infection. The lowest  concentration of SARS-CoV-2 viral copies this assay can detect is 250  copies / mL. A negative result does not preclude SARS-CoV-2 infection  and should not be used as the sole  basis for treatment or other  patient management decisions.  A negative result may occur with  improper specimen collection / handling, submission of specimen other  than nasopharyngeal swab, presence of viral mutation(s) within the  areas targeted by this assay, and inadequate number of viral copies  (<250 copies / mL). A negative result must be combined with clinical  observations, patient history, and epidemiological information. If result is POSITIVE SARS-CoV-2 target nucleic acids are DETECTED. The SARS-CoV-2 RNA is generally detectable in upper and lower  respiratory specimens dur ing the acute phase of infection.  Positive  results are indicative of active infection with SARS-CoV-2.  Clinical  correlation with patient  history and other diagnostic information is  necessary to determine patient infection status.  Positive results do  not rule out bacterial infection or co-infection with other viruses. If result is PRESUMPTIVE POSTIVE SARS-CoV-2 nucleic acids MAY BE PRESENT.   A presumptive positive result was obtained on the submitted specimen  and confirmed on repeat testing.  While 2019 novel coronavirus  (SARS-CoV-2) nucleic acids may be present in the submitted sample  additional confirmatory testing may be necessary for epidemiological  and / or clinical management purposes  to differentiate between  SARS-CoV-2 and other Sarbecovirus currently known to infect humans.  If clinically indicated additional testing with an alternate test  methodology [phone]) is advised. The SARS-CoV-2 RNA is generally  detectable in upper and lower respiratory sp ecimens during the acute  phase of infection. The expected result is Negative. Fact Sheet for Patients:  StrictlyIdeas.no Fact Sheet for Healthcare Providers: BankingDealers.co.za This test is not yet approved or cleared by the [place] FDA and has been authorized for detection and/or diagnosis of SARS-CoV-2 by FDA under an Emergency Use Authorization (EUA).  This EUA will remain in effect (meaning this test can be used) for the duration of the COVID-19 declaration under Section 564(b)(1) of the Act, 21 U.S.C. section 360bbb-3(b)(1), unless the authorization is terminated or revoked sooner. Performed at [facility], [place] [address] [place], [place] [address]       Radiology Studies: No results found.   Scheduled Meds: . enoxaparin (LOVENOX) injection  40 mg Subcutaneous Q24H  . ezetimibe  10 mg Oral Daily  . FLUoxetine  60 mg Oral Daily  . lamoTRIgine  100 mg Oral Daily  . levothyroxine  75 mcg Oral Q0600  . QUEtiapine  300 mg Oral QHS  . rosuvastatin  40 mg Oral Daily  . senna-docusate   1 tablet Oral BID   Continuous Infusions:   LOS: 1 day   Time Spent in minutes   30 minutes  Ashleah Valtierra D.O. on 10/21/2018 at 9:18 AM  Between 7am to 7pm - Please see pager noted on [email or web address]  After 7pm go to [email or web address]  And look for the night coverage person covering for me after hours  [facility] Office  [phone]

## 2018-10-21 NOTE — Plan of Care (Signed)
  Problem: Education: Goal: Knowledge of General Education information will improve Description: Including pain rating scale, medication(s)/side effects and non-pharmacologic comfort measures Outcome: Progressing   Problem: Clinical Measurements: Goal: Ability to maintain clinical measurements within normal limits will improve Outcome: Progressing   Problem: Activity: Goal: Risk for activity intolerance will decrease Outcome: Progressing   Problem: Nutrition: Goal: Adequate nutrition will be maintained Outcome: Progressing   Problem: Coping: Goal: Level of anxiety will decrease Outcome: Progressing   Problem: Pain Managment: Goal: General experience of comfort will improve Outcome: Progressing   Problem: Safety: Goal: Ability to remain free from injury will improve Outcome: Progressing   Problem: Skin Integrity: Goal: Risk for impaired skin integrity will decrease Outcome: Progressing

## 2018-10-22 DIAGNOSIS — E039 Hypothyroidism, unspecified: Secondary | ICD-10-CM | POA: Diagnosis not present

## 2018-10-22 DIAGNOSIS — R911 Solitary pulmonary nodule: Secondary | ICD-10-CM | POA: Diagnosis not present

## 2018-10-22 DIAGNOSIS — W1839XA Other fall on same level, initial encounter: Secondary | ICD-10-CM | POA: Diagnosis not present

## 2018-10-22 DIAGNOSIS — M6281 Muscle weakness (generalized): Secondary | ICD-10-CM | POA: Diagnosis not present

## 2018-10-22 DIAGNOSIS — S22089A Unspecified fracture of T11-T12 vertebra, initial encounter for closed fracture: Secondary | ICD-10-CM | POA: Diagnosis not present

## 2018-10-22 DIAGNOSIS — E038 Other specified hypothyroidism: Secondary | ICD-10-CM | POA: Diagnosis not present

## 2018-10-22 DIAGNOSIS — S32502D Unspecified fracture of left pubis, subsequent encounter for fracture with routine healing: Secondary | ICD-10-CM | POA: Diagnosis not present

## 2018-10-22 DIAGNOSIS — F3181 Bipolar II disorder: Secondary | ICD-10-CM | POA: Diagnosis not present

## 2018-10-22 DIAGNOSIS — Z96642 Presence of left artificial hip joint: Secondary | ICD-10-CM | POA: Diagnosis not present

## 2018-10-22 DIAGNOSIS — R0902 Hypoxemia: Secondary | ICD-10-CM | POA: Diagnosis not present

## 2018-10-22 DIAGNOSIS — R569 Unspecified convulsions: Secondary | ICD-10-CM | POA: Diagnosis not present

## 2018-10-22 DIAGNOSIS — M79605 Pain in left leg: Secondary | ICD-10-CM | POA: Diagnosis not present

## 2018-10-22 DIAGNOSIS — S32592A Other specified fracture of left pubis, initial encounter for closed fracture: Secondary | ICD-10-CM | POA: Diagnosis not present

## 2018-10-22 DIAGNOSIS — R2689 Other abnormalities of gait and mobility: Secondary | ICD-10-CM | POA: Diagnosis not present

## 2018-10-22 DIAGNOSIS — R279 Unspecified lack of coordination: Secondary | ICD-10-CM | POA: Diagnosis not present

## 2018-10-22 DIAGNOSIS — S22088D Other fracture of T11-T12 vertebra, subsequent encounter for fracture with routine healing: Secondary | ICD-10-CM | POA: Diagnosis not present

## 2018-10-22 DIAGNOSIS — I2510 Atherosclerotic heart disease of native coronary artery without angina pectoris: Secondary | ICD-10-CM | POA: Diagnosis not present

## 2018-10-22 DIAGNOSIS — R2681 Unsteadiness on feet: Secondary | ICD-10-CM | POA: Diagnosis not present

## 2018-10-22 DIAGNOSIS — W19XXXA Unspecified fall, initial encounter: Secondary | ICD-10-CM | POA: Diagnosis not present

## 2018-10-22 DIAGNOSIS — K5900 Constipation, unspecified: Secondary | ICD-10-CM | POA: Diagnosis not present

## 2018-10-22 DIAGNOSIS — Y92009 Unspecified place in unspecified non-institutional (private) residence as the place of occurrence of the external cause: Secondary | ICD-10-CM | POA: Diagnosis not present

## 2018-10-22 DIAGNOSIS — R262 Difficulty in walking, not elsewhere classified: Secondary | ICD-10-CM | POA: Diagnosis not present

## 2018-10-22 DIAGNOSIS — Z23 Encounter for immunization: Secondary | ICD-10-CM | POA: Diagnosis not present

## 2018-10-22 DIAGNOSIS — I25119 Atherosclerotic heart disease of native coronary artery with unspecified angina pectoris: Secondary | ICD-10-CM | POA: Diagnosis not present

## 2018-10-22 DIAGNOSIS — S32592D Other specified fracture of left pubis, subsequent encounter for fracture with routine healing: Secondary | ICD-10-CM | POA: Diagnosis not present

## 2018-10-22 DIAGNOSIS — E785 Hyperlipidemia, unspecified: Secondary | ICD-10-CM | POA: Diagnosis not present

## 2018-10-22 DIAGNOSIS — Z9181 History of falling: Secondary | ICD-10-CM | POA: Diagnosis not present

## 2018-10-22 DIAGNOSIS — Y92003 Bedroom of unspecified non-institutional (private) residence as the place of occurrence of the external cause: Secondary | ICD-10-CM | POA: Diagnosis not present

## 2018-10-22 DIAGNOSIS — Z743 Need for continuous supervision: Secondary | ICD-10-CM | POA: Diagnosis not present

## 2018-10-22 DIAGNOSIS — M4840XD Fatigue fracture of vertebra, site unspecified, subsequent encounter for fracture with routine healing: Secondary | ICD-10-CM | POA: Diagnosis not present

## 2018-10-22 DIAGNOSIS — E559 Vitamin D deficiency, unspecified: Secondary | ICD-10-CM | POA: Diagnosis not present

## 2018-10-22 MED ORDER — OXYCODONE-ACETAMINOPHEN 5-325 MG PO TABS: 1.0000 | ORAL_TABLET | ORAL | 0 refills | Status: DC | PRN

## 2018-10-22 MED ORDER — CLONAZEPAM 0.5 MG PO TABS: 0.5000 mg | ORAL_TABLET | Freq: Three times a day (TID) | ORAL | 0 refills | Status: DC | PRN

## 2018-10-22 NOTE — Discharge Summary (Signed)
Physician Discharge Summary  TEMEIKA MOULDEN [id number] DOB: 1940/10/11 DOA: 10/18/2018  PCP: Vivi Barrack, MD  Admit date: 10/18/2018 Discharge date: 10/22/2018  Time spent: 45 minutes  Recommendations for Outpatient Follow-up:  Patient will be discharged to skilled nursing facility, continue physical and occupational therapy.  Patient will need to follow up with primary care provider within one week of discharge.  Patient should continue medications as prescribed.  Patient should follow a heart healthy diet. Recommend sleep study.   Discharge Diagnoses:  Left inferior pubic ramus fracture secondary to fall T11 vertebral body fracture Hypothyroidism Coronary artery disease Hyperlipidemia Bipolar disorder Anxiety and depression Pulmonary nodules Hypoxia Constipation  Discharge Condition: Stable  Diet recommendation: heart healthy  Filed Weights   10/18/18 1811  Weight: 77.3 kg    History of present illness:  On 10/18/2018 by Dr. Hessie Diener a 78 y.o.femalewith medical history significant ofhyperlipidemia, carotid arterydisease, hypothyroidism, bipolar disorder,PVCswho presents from home with fall. Patient was apparently all right till last night. Last night at2:30 AM she woke up and tried to close the door of her bedroom and while on returning to the bed she tried to grab the post but could not reach and fell on the ground.After the fall she experienced pain on the left hip and was unable to walk. She was brought to the emergency department this morning. Patient has history of falls in the past and had left hip fracture last yearin November and underwent left hip replacement.She lives alone in house on the ground floor. Patient seen and examined the bedside in the emergency department. During my evaluation, she was resting comfortably on the bed. She did not complain of any pain while lying on the bed but complains of pain when shetries to  stand on her feet.She denies any chest pain, shortness of breath, palpitations, headache, nausea, vomiting, abdominal pain, dysuria.She washemodynamically stable.  Hospital Course:  Left inferior pubic ramus fracture secondary to fall -Patient with history of fall and left hip fracture last year November, underwent left hip replacement -Patient noted to have nondisplaced left inferior pubic ramus fracture on x-ray -PT and OT recommending SNF -Continue pain control  T11 vertebral body fracture -Noted on imaging however patient does not complain of pain -Manage conservatively  Hypothyroidism -Continue Synthroid  Coronary artery disease -Patient follows with cardiology, continue statin  Hyperlipidemia -Continue statin, Zetia  Bipolar disorder -Stable, continue Lamictal, Seroquel  Anxiety and depression -continue Prozac and clonazepam  Pulmonary nodules -Occasional 1 to 2 mm nodular opacity seen on CT chest.  Noncontrast CT recommended 12 months for follow-up as she is a former smoker  Hypoxia -Currently on oxygen.  No history of oxygen use at home -Chest x-ray unremarkable for infection -CTA chest showed no PE.  No thoracic aortic aneurysm or dissection.  There is aortic atherosclerosis.  Foci of great vessel and coronary artery calcification.  No pneumothorax. -Continue incentive spirometry -it appears that patient's oxygen saturations fall into the 80s when sleeping- she will need a sleep study; at rest and with speaking, oxygenation remains in the 90s  Constipation -resolved, patient had prune juice and sennakot  Procedures: None  Consultations: None  Discharge Exam: Vitals:   10/22/18 0531 10/22/18 0755  BP: (!) 128/52 (!) 147/35  Pulse: 76 84  Resp:  15  Temp:  98.6 F (37 C)  SpO2: 99% 92%     General: Well developed, well nourished, NAD, appears stated age  HEENT: NCAT, mucous membranes moist.  Neck: Supple  Cardiovascular: S1 S2  auscultated, RRR, no murmur  Respiratory: Clear to auscultation bilaterally with equal chest rise  Abdomen: Soft, nontender, nondistended, + bowel sounds  Extremities: warm dry without cyanosis clubbing or edema  Neuro: AAOx3, nonfocal  Psych: Pleasant, appropriate mood and affect  Discharge Instructions Discharge Instructions    Discharge instructions   Complete by: As directed    Patient will be discharged to skilled nursing facility, continue physical and occupational therapy.  Patient will need to follow up with primary care provider within one week of discharge.  Patient should continue medications as prescribed.  Patient should follow a heart healthy diet. Recommend sleep study.     Allergies as of 10/22/2018      Reactions   Codeine Nausea And Vomiting, Nausea Only   Other reaction(s): Unknown      Medication List    TAKE these medications   cholecalciferol 25 MCG (1000 UT) tablet Commonly known as: VITAMIN D3 Take 2,000 Units by mouth daily.   clonazePAM 0.5 MG tablet Commonly known as: KLONOPIN Take 1 tablet (0.5 mg total) by mouth 3 (three) times daily as needed for anxiety.   ezetimibe 10 MG tablet Commonly known as: ZETIA TAKE 1 TABLET BY MOUTH DAILY. SCHEDULE OFFICE VISIT FOR FURTHER REFILLS What changed: See the new instructions.   FLUoxetine 20 MG tablet Commonly known as: PROZAC Take 60 mg by mouth daily.   lamoTRIgine 100 MG tablet Commonly known as: LAMICTAL Take 100 mg by mouth daily.   levothyroxine 75 MCG tablet Commonly known as: SYNTHROID TAKE 1 TABLET(75 MCG) BY MOUTH DAILY What changed: See the new instructions.   meclizine 25 MG tablet Commonly known as: ANTIVERT Take 1 tablet (25 mg total) by mouth 3 (three) times daily as needed for dizziness.   multivitamin with minerals Tabs tablet Take 1 tablet by mouth daily.   naproxen sodium 220 MG tablet Commonly known as: ALEVE Take 440 mg by mouth 2 (two) times daily.    oxyCODONE-acetaminophen 5-325 MG tablet Commonly known as: PERCOCET/ROXICET Take 1 tablet by mouth every 4 (four) hours as needed for moderate pain.   pantoprazole 20 MG tablet Commonly known as: PROTONIX Take 40 mg by mouth daily as needed for heartburn.   QUEtiapine 300 MG tablet Commonly known as: SEROQUEL Take 300 mg by mouth at bedtime.   rosuvastatin 40 MG tablet Commonly known as: CRESTOR TAKE 1 TABLET BY MOUTH DAILY   senna 8.6 MG Tabs tablet Commonly known as: SENOKOT Take 5 tablets by mouth at bedtime.      Allergies  Allergen Reactions   Codeine Nausea And Vomiting and Nausea Only    Other reaction(s): Unknown      The results of significant diagnostics from this hospitalization (including imaging, microbiology, ancillary and laboratory) are listed below for reference.    Significant Diagnostic Studies: Dg Lumbar Spine Complete  Result Date: 10/18/2018 CLINICAL DATA:  Fall with hip and back pain EXAM: LUMBAR SPINE - COMPLETE 4+ VIEW COMPARISON:  None available FINDINGS: No evidence of acute fracture.  No traumatic malalignment. Levoscoliosis and L4-5 anterolisthesis. Diffuse lumbar degenerative disc narrowing sparing L5-S1, with bulky endplate spurring. Degenerative facet spurring at L3-4 and below. Osteopenia and atherosclerosis IMPRESSION: 1. No acute finding. 2. 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 Electronically Signed   By: Monte Fantasia M.D.   On: 10/18/2018 06:37   Ct Head Wo Contrast  Result Date: 10/18/2018 CLINICAL DATA:  Syncopal episode. EXAM: CT  HEAD WITHOUT CONTRAST TECHNIQUE: Contiguous axial images were obtained from the base of the skull through the vertex without intravenous contrast. COMPARISON:  Head CT 08/22/2007 and MRI brain 06/09/2016 FINDINGS: Brain: Stable age related cerebral atrophy, ventriculomegaly and periventricular white matter disease. No extra-axial fluid collections are identified. No CT  findings for acute hemispheric infarction or intracranial hemorrhage. No mass lesions. The brainstem and cerebellum are normal. Vascular: Vascular calcifications but no aneurysm or hyperdense vessels. Skull: No skull fracture or bone lesions. Sinuses/Orbits: The paranasal sinuses and mastoid air cells are clear. Other: No scalp lesions or hematoma. IMPRESSION: 1. Stable appearing age related cerebral atrophy, ventriculomegaly and periventricular white matter disease. 2. No acute intracranial findings, mass lesion or skull fracture. Electronically Signed   By: Marijo Sanes M.D.   On: 10/18/2018 10:50   Ct Angio Chest Pe W And/or Wo Contrast  Result Date: 10/18/2018 CLINICAL DATA:  Fall with pelvis fracture.  Pain.  Positive D-dimer EXAM: CT ANGIOGRAPHY CHEST WITH CONTRAST TECHNIQUE: Multidetector CT imaging of the chest was performed using the standard protocol during bolus administration of intravenous contrast. Multiplanar CT image reconstructions and MIPs were obtained to evaluate the vascular anatomy. CONTRAST:  81mL OMNIPAQUE IOHEXOL 350 MG/ML SOLN COMPARISON:  Chest radiograph October 18, 2018 FINDINGS: Cardiovascular: There is no demonstrable pulmonary embolus. There is no appreciable thoracic aortic aneurysm or dissection. The visualized great vessels show scattered foci of calcification. There are foci of aortic atherosclerosis. There are foci of coronary artery calcification. Rather minimal pericardial fluid may be within physiologic range. There is no pericardial thickening. Mediastinum/Nodes: Thyroid is diminutive. No thyroid lesions demonstrable on this study. There is no evident thoracic adenopathy. There is a focal hiatal hernia. Lungs/Pleura: No pneumothorax. There is bibasilar atelectatic change. There is no evident edema or consolidation. On axial slice 47 series 6, there is a 1 mm nodular opacity in the anterior segment of the right upper lobe. On axial slice 84 series 6, there is a 2 mm  nodular opacity in the periphery of the lateral segment of the right lower lobe. No appreciable pleural effusions. Upper Abdomen: There is upper abdominal aortic atherosclerosis. Visualized upper abdominal structures otherwise appear unremarkable. Musculoskeletal: There is anterior wedging of the T10 vertebral body, age uncertain. There is a nondisplaced fracture of the anterior inferior aspect of the T11 vertebral body. No blastic or lytic bone lesions. No chest wall lesions. Review of the MIP images confirms the above findings. IMPRESSION: 1. No demonstrable pulmonary embolus. No thoracic aortic aneurysm or dissection. There is aortic atherosclerosis. There are foci of great vessel and coronary artery calcification. 2. No pneumothorax. Areas of mild atelectatic change. Occasional 1-2 mm nodular opacities as noted. No follow-up needed if patient is low-risk (and has no known or suspected primary neoplasm). Non-contrast chest CT can be considered in 12 months if patient is high-risk. This recommendation follows the consensus statement: Guidelines for Management of Incidental Pulmonary Nodules Detected on CT Images: From the Fleischner Society 2017; Radiology 2017; 284:228-243. 3.  Hiatal hernia. 4. Anterior wedging of the T10 vertebral body. Fracture along the inferior aspect of T11 vertebral body. Given other trauma, these fractures may well be recent. 5.  No evident adenopathy. Aortic Atherosclerosis (ICD10-I70.0). Electronically Signed   By: Lowella Grip III M.D.   On: 10/18/2018 14:25   Dg Chest Portable 1 View  Result Date: 10/18/2018 CLINICAL DATA:  Hypoxia EXAM: PORTABLE CHEST 1 VIEW COMPARISON:  12/11/2017 FINDINGS: Heart and mediastinal contours are within normal  limits. No focal opacities or effusions. No acute bony abnormality. IMPRESSION: No active disease. Electronically Signed   By: Rolm Baptise M.D.   On: 10/18/2018 11:48   Dg Hip Unilat W Or Wo Pelvis 2-3 Views Left  Result Date:  10/18/2018 CLINICAL DATA:  Fall with hip and back pain EXAM: DG HIP (WITH OR WITHOUT PELVIS) 2-3V LEFT COMPARISON:  12/17/2017 FINDINGS: Left hip arthroplasty. The femoral stem is completely seen only on the lateral view. No evidence of periprosthetic fracture or loosening. Suspect a left inferior pubic ramus fracture that is nondisplaced. IMPRESSION: Suspect a nondisplaced left inferior pubic ramus fracture. Electronically Signed   By: Monte Fantasia M.D.   On: 10/18/2018 06:35   Vas US Carotid  Result Date: 10/03/2018 Carotid Arterial Duplex Study Indications:       Carotid artery disease. Patient denies any new                    cerebrovascular symptoms. Risk Factors:      Hyperlipidemia, past history of smoking. Comparison Study:  In 9/19, a carotid duplex showed a velocity of 84/31 cm/sec                    in the RICA and [id number] cm/sec in the LICA. In 8/18, a carotid                    duplex showed velocity of 106/15 cm/sec in the RICA and                    [id number] cm/sec in the LICA. Performing Technologist: Mariane Masters RVT  Examination Guidelines: A complete evaluation includes B-mode imaging, spectral Doppler, color Doppler, and power Doppler as needed of all accessible portions of each vessel. Bilateral testing is considered an integral part of a complete examination. Limited examinations for reoccurring indications may be performed as noted.  Right Carotid Findings: +----------+--------+--------+--------+------------------+--------+             PSV cm/s EDV cm/s Stenosis Plaque Description Comments  +----------+--------+--------+--------+------------------+--------+  CCA Prox   102      21                                             +----------+--------+--------+--------+------------------+--------+  CCA Distal 51       14                                             +----------+--------+--------+--------+------------------+--------+  ICA Prox   94       35       1-39%    heterogenous                  +----------+--------+--------+--------+------------------+--------+  ICA Mid    87       38                                             +----------+--------+--------+--------+------------------+--------+  ICA Distal 66       24                                             +----------+--------+--------+--------+------------------+--------+  ECA        109      20                                             +----------+--------+--------+--------+------------------+--------+ +----------+--------+-------+----------------+-------------------+             PSV cm/s EDV cms Describe         Arm Pressure (mmHG)  +----------+--------+-------+----------------+-------------------+  Subclavian 121              Multiphasic, WNL 120                  +----------+--------+-------+----------------+-------------------+ +---------+--------+--+--------+--+  Vertebral PSV cm/s 58 EDV cm/s 13  +---------+--------+--+--------+--+  Left Carotid Findings: +----------+--------+--------+--------+------------------+---------+             PSV cm/s EDV cm/s Stenosis Plaque Description Comments   +----------+--------+--------+--------+------------------+---------+  CCA Prox   70       15                                              +----------+--------+--------+--------+------------------+---------+  CCA Distal 69       22                                              +----------+--------+--------+--------+------------------+---------+  ICA Prox   156      43       40-59%   hypoechoic         Shadowing  +----------+--------+--------+--------+------------------+---------+  ICA Mid    92       26                                              +----------+--------+--------+--------+------------------+---------+  ICA Distal 78       25                                              +----------+--------+--------+--------+------------------+---------+  ECA        74       17                                               +----------+--------+--------+--------+------------------+---------+ +----------+--------+--------+----------------+-------------------+             PSV cm/s EDV cm/s Describe         Arm Pressure (mmHG)  +----------+--------+--------+----------------+-------------------+  Subclavian 60                Multiphasic, WNL 122                  +----------+--------+--------+----------------+-------------------+ +---------+--------+--+--------+--+---------+  Vertebral PSV cm/s 48 EDV cm/s 13 Antegrade  +---------+--------+--+--------+--+---------+  Summary: Right Carotid: Velocities in the right ICA are consistent with a 1-39% stenosis. Left Carotid: Velocities in the left  ICA are consistent with a 40-59% stenosis. Vertebrals:  Bilateral vertebral arteries demonstrate antegrade flow. Subclavians: Normal flow hemodynamics were seen in bilateral subclavian              arteries. *See table(s) above for measurements and observations. Suggest follow up study in 12 months. Electronically signed by Jenkins Rouge MD on 10/03/2018 at 1:51:02 PM.    Final     Microbiology: Recent Results (from the past 240 hour(s))  SARS Coronavirus 2 [facility] order, Performed in [facility] [place] hospital lab) Nasopharyngeal Nasopharyngeal Swab     Status: None   Collection Time: 10/18/18  1:00 PM   Specimen: Nasopharyngeal Swab  Result Value Ref Range Status   SARS Coronavirus 2 NEGATIVE NEGATIVE Final    Comment: (NOTE) If result is NEGATIVE SARS-CoV-2 target nucleic acids are NOT DETECTED. The SARS-CoV-2 RNA is generally detectable in upper and lower  respiratory specimens during the acute phase of infection. The lowest  concentration of SARS-CoV-2 viral copies this assay can detect is 250  copies / mL. A negative result does not preclude SARS-CoV-2 infection  and should not be used as the sole basis for treatment or other  patient management decisions.  A negative result may occur with  improper specimen collection / handling,  submission of specimen other  than nasopharyngeal swab, presence of viral mutation(s) within the  areas targeted by this assay, and inadequate number of viral copies  (<250 copies / mL). A negative result must be combined with clinical  observations, patient history, and epidemiological information. If result is POSITIVE SARS-CoV-2 target nucleic acids are DETECTED. The SARS-CoV-2 RNA is generally detectable in upper and lower  respiratory specimens dur ing the acute phase of infection.  Positive  results are indicative of active infection with SARS-CoV-2.  Clinical  correlation with patient history and other diagnostic information is  necessary to determine patient infection status.  Positive results do  not rule out bacterial infection or co-infection with other viruses. If result is PRESUMPTIVE POSTIVE SARS-CoV-2 nucleic acids MAY BE PRESENT.   A presumptive positive result was obtained on the submitted specimen  and confirmed on repeat testing.  While 2019 novel coronavirus  (SARS-CoV-2) nucleic acids may be present in the submitted sample  additional confirmatory testing may be necessary for epidemiological  and / or clinical management purposes  to differentiate between  SARS-CoV-2 and other Sarbecovirus currently known to infect humans.  If clinically indicated additional testing with an alternate test  methodology [phone]) is advised. The SARS-CoV-2 RNA is generally  detectable in upper and lower respiratory sp ecimens during the acute  phase of infection. The expected result is Negative. Fact Sheet for Patients:  StrictlyIdeas.no Fact Sheet for Healthcare Providers: BankingDealers.co.za This test is not yet approved or cleared by the [place] FDA and has been authorized for detection and/or diagnosis of SARS-CoV-2 by FDA under an Emergency Use Authorization (EUA).  This EUA will remain in effect (meaning this test can be  used) for the duration of the COVID-19 declaration under Section 564(b)(1) of the Act, 21 U.S.C. section 360bbb-3(b)(1), unless the authorization is terminated or revoked sooner. Performed at [facility], [place] [address], [place], [place] [address]      Labs: Basic Metabolic Panel: Recent Labs  Lab 10/18/18 1124  NA 138  K 3.8  CL 104  CO2 27  GLUCOSE 122*  BUN 21  CREATININE 0.85  CALCIUM 9.0   Liver Function Tests: No results for input(s):  AST, ALT, ALKPHOS, BILITOT, PROT, ALBUMIN in the last 168 hours. No results for input(s): LIPASE, AMYLASE in the last 168 hours. No results for input(s): AMMONIA in the last 168 hours. CBC: Recent Labs  Lab 10/18/18 1124  WBC 8.3  NEUTROABS 5.2  HGB 11.0*  HCT 33.2*  MCV 99.4  PLT 196   Cardiac Enzymes: No results for input(s): CKTOTAL, CKMB, CKMBINDEX, TROPONINI in the last 168 hours. BNP: BNP (last 3 results) No results for input(s): BNP in the last 8760 hours.  ProBNP (last 3 results) No results for input(s): PROBNP in the last 8760 hours.  CBG: No results for input(s): GLUCAP in the last 168 hours.     Signed:  Chrishonda Lefthand  [facility] 10/22/2018, 8:40 AM

## 2018-10-22 NOTE — Progress Notes (Addendum)
Patient wishes to have CSW evaluate insurance authorization for 24/7 caregiving at home versus SNF placement. - on call CSW paged.  [phone] - report given to facility staff, Darden Dates at [facility]

## 2018-10-22 NOTE — Discharge Instructions (Signed)
Hypoxia Hypoxia is a condition that happens when there is a lack of oxygen in the body's tissues and organs. When there is not enough oxygen, organs cannot work as they should. This causes serious problems throughout the body and in the brain. What are the causes? This condition may be caused by:  Exposure to high altitude.  A collapsed lung (pneumothorax).  Lung infection (pneumonia).  Lung injury.  Long-term (chronic) lung disease, such as COPD (chronic obstructive pulmonary disease).  Blood collecting in the chest cavity (hemothorax).  Food, saliva, or vomit getting into the airway (aspiration).  Reduced blood flow (ischemia).  Severe blood loss.  Slow or shallow breathing (hypoventilation).  Blood disorders, such as anemia.  Carbon monoxide poisoning.  The heart suddenly stopping (cardiac arrest).  Anesthetic medicines.  Drowning.  Choking. What are the signs or symptoms? Symptoms of this condition include:  Headache.  Fatigue.  Drowsiness.  Forgetfulness.  Nausea.  Confusion.  Shortness of breath.  Dizziness.  Bluish color of the skin, lips, or nail beds (cyanosis).  Change in consciousness or awareness. If hypoxia is not treated, it can lead to convulsions, loss of consciousness (coma), or brain damage. How is this diagnosed? This condition may be diagnosed based on:  A physical exam.  Blood tests.  A test that measures how much oxygen is in your blood (pulse oximetry). This is done with a sensor that is placed on your finger, toe, or earlobe.  Chest X-ray.  Tests to check your lung function (pulmonary function tests).  A test to check the electrical activity of your heart (electrocardiogram, ECG). You may have other tests to determine the cause of your hypoxia. How is this treated?  Treatment for this condition depends on what is causing the hypoxia. You will likely be treated with oxygen therapy. This may be done by giving you oxygen  through a face mask or through tubes in your nose. Your health care provider may also recommend other therapies to treat the underlying cause of your hypoxia. Follow these instructions at home:  Take over-the-counter and prescription medicines only as told by your health care provider.  Do not use any products that contain nicotine or tobacco, such as cigarettes and e-cigarettes. If you need help quitting, ask your health care provider.  Avoid secondhand smoke.  Work with your health care provider to manage any chronic conditions you have that may be causing hypoxia, such as COPD.  Keep all follow-up visits as told by your health care provider. This is important. Contact a health care provider if:  You have a fever.  You have trouble breathing, even after treatment.  You become extremely short of breath when you exercise. Get help right away if:  Your shortness of breath gets worse, especially with normal or very little activity.  Your skin, lips, or nail beds have a bluish color.  You become confused or you cannot think properly.  You have chest pain. Summary  Hypoxia is a condition that happens when there is a lack of oxygen in the body's tissues and organs.  If hypoxia is not treated, it can lead to convulsions, loss of consciousness (coma), or brain damage.  Symptoms of hypoxia can include a headache, shortness of breath, confusion, nausea, and a bluish skin color.  Hypoxia has many possible causes, including exposure to high altitude, carbon monoxide poisoning, or other health issues, such as blood disorders or cardiac arrest.  Hypoxia is usually treated with oxygen therapy. This information is not  intended to replace advice given to you by your health care provider. Make sure you discuss any questions you have with your health care provider. Document Released: 02/24/2016 Document Revised: 12/18/2016 Document Reviewed: 02/24/2016 Elsevier Patient Education  2020  [employer].   Simple Pelvic Fracture, Adult A pelvic fracture is a break in one of the bones in the pelvis. The pelvic bones include the bones that you sit on and the bones that make up the lower part of your spine. A pelvic fracture is called simple if:  There is only one break.  The broken bone is stable.  The broken bone is not moving out of place.  The bone does not pierce the skin. A pelvic fracture may occur along with injuries to nerves, blood vessels, soft tissues, the urinary tract, and abdominal organs. What are the causes? Common causes of this type of fracture include:  A fall.  A car accident.  Force or pressure that hits the pelvis. What increases the risk? You are more likely to get this injury if you:  Play high-impact sports, such as rugby or football.  You have thinning or weakening of your bones, such as from osteopenia or osteoporosis.  Have cancer that has spread to the bone.  Have a condition that is associated with falling, such as Parkinson's disease or seizure.  Have had a stroke.  Smoke. What are the signs or symptoms? Signs and symptoms may include:  Tenderness, swelling, or bruising in the affected area.  Pain when moving the hip.  Pain when walking or standing. How is this diagnosed? This condition is diagnosed with a physical exam, X-ray, or CT scan. You may also have blood or urine tests:  To rule out damage to other organs, such as the urethra.  To check for internal bleeding in the pelvic area. How is this treated? The goal of treatment is to get the bone to heal in its original position. Treatment includes:  Staying in bed (bed rest).  Using crutches, a walker, or a wheelchair until the bone heals.  Medicines to treat pain.  Medicines to prevent blood clots from forming in your legs.  Physical therapy. Follow these instructions at home: Medicines  Take over-the-counter and prescription medicines only as told by your  health care provider.  Do not drive or use heavy machinery while taking prescription pain medicine. Managing pain, stiffness, and swelling   If directed, apply ice to the injured area: ? Put ice in a plastic bag. ? Place a towel between your skin and the bag. ? Leave the ice on for 20 minutes, 2-3 times a day.  Gently move your toes often to avoid stiffness and to lessen swelling. Activity  Stay on bed rest for as long as directed by your health care provider.  While on bed rest: ? Change the position of your legs every 1-2 hours. This keeps blood moving well through both of your legs. ? You may sit for as long as you feel comfortable.  After bed rest: ? Avoid strenuous activities for as long as directed by your health care provider. ? Return to your normal activities as directed by your health care provider. Ask your health care provider what activities are safe for you.  Use items to help you with your activities, such as: ? A long-handled shoehorn to help you put your shoes on. ? Elastic shoelaces that do not need to be retied. ? A reacher or grabber to pick items up off  the floor. General instructions   Do not  drive or operate heavy machinery until your health care provider tells you it is safe to do so.  Use a wheelchair or assistive devices as directed by your health care provider. When you are ready to walk, start by using crutches or a walker to help support your body weight.  Have someone help you at home as you recover.  Wear compression stockings as told by your health care provider.  Do not use any products that contain nicotine or tobacco, such as cigarettes and e-cigarettes. These can delay bone healing. If you need help quitting, ask your health care provider.  If you have an underlying condition that caused your pelvic fracture, work with your health care provider to manage your condition.  Keep all follow-up visits as told by your health care provider.  This is important. Contact a health care provider if:  Your pain gets worse.  Your pain is not relieved with medicines. Get help right away if you:  Feel light-headed or faint.  Develop chest pain.  Develop shortness of breath.  Have a fever.  Have blood in your urine or your stools.  Have bleeding in your vagina.  Have difficulty or pain with urination or with passing stool.  Have difficulty or increased pain with walking.  Have new or increased swelling in one of your legs.  Have numbness in your legs or groin area. Summary  A pelvic fracture is a break in one of the bones in the pelvis. These are the bones that you sit on and the bones that make up the lower part of your spine.  A pelvic fracture is called simple if there is only one break, the broken bone is stable, the broken bone is not moving out of place, or the bone does not pierce the skin.  Common causes of this type of fracture include a fall, a car accident, or a force or pressure that hits the pelvis.  The goal of treatment is to get the bone to heal in its original position.  Treatment includes bed rest and using a wheelchair. When ready to walk, you may use crutches or a walker until your bone heals. Other treatments include physical therapy and medicine to treat pain and prevent blood clots. This information is not intended to replace advice given to you by your health care provider. Make sure you discuss any questions you have with your health care provider. Document Released: 03/16/2001 Document Revised: 08/12/2017 Document Reviewed: 02/15/2017 Elsevier Patient Education  2020 [employer].

## 2018-10-22 NOTE — TOC Transition Note (Signed)
Transition of Care [facility]) - CM/SW Discharge Note   Patient Details  Name: Shari Horn MRN: [id number] Date of Birth: [birth year]  Transition of Care [facility]) CM/SW Contact:  Bary Castilla, LCSW Phone Number: [phone] 10/22/2018, 10:33 AM   Clinical Narrative:     CSW received message from RN that patient wanted to speak with CSW. CSW spoke with patient in regards to 24 hour care through insurances. CSW explained that it would need to be private pay. Patient inquired if she could move to [facility] once a bed became available due to her may not liking facilitiy.CSW explained that [facility] did not make a bed offer at this time.   CSW spoke with Ebony Hail at [facility] in regards to patient's request. CSW was informed that it was a possibility however the referral process would need to occur and it would not be an immediate transfer if bed was found at another facility.   CSW relayed informed to patient and encouraged patient to keep an open mind due to her may liking the facility. Patient stated that she would do so.  Patient will DC [id number] PInes? Anticipated DC date:?10/22/18 Family notified:?Son- Engineer, manufacturing [id number]   Per MD patient ready for DC to [facility], patient, patient's family, and facility notified of DC. Discharge Summary sent to facility. RN given number for [id number] Room 127 report . DC packet on chart. Ambulance transport requested for patient.   CSW updated son about discharge and information that was received about patient's facility.   CSW signing off.   [place], [place] [phone]    Final next level of care: Skilled Nursing Facility Barriers to Discharge: No Barriers Identified   Patient Goals and CMS Choice Patient states their goals for this hospitalization and ongoing recovery are:: Pt and family are agreeable to discharging to SNF for short term rehab CMS Medicare.gov Compare Post Acute Care list provided to::  Patient Choice offered to / list presented to : Patient  Discharge Placement              Patient chooses bed at: [facility]) Patient to be transferred to facility by: [place] Name of family member notified: Chester-son Patient and family notified of of transfer: 10/22/18  Discharge Plan and Services In-house Referral: Clinical Social Work   Post Acute Care Choice: [place]                               Social Determinants of Health (SDOH) Interventions     Readmission Risk Interventions No flowsheet data found.

## 2018-10-22 NOTE — Plan of Care (Signed)
Pt transfer to [facility] with 1 assist, stand pivot, WBAT

## 2018-10-25 DIAGNOSIS — I2510 Atherosclerotic heart disease of native coronary artery without angina pectoris: Secondary | ICD-10-CM | POA: Diagnosis not present

## 2018-10-25 DIAGNOSIS — S32592A Other specified fracture of left pubis, initial encounter for closed fracture: Secondary | ICD-10-CM | POA: Diagnosis not present

## 2018-10-25 DIAGNOSIS — E039 Hypothyroidism, unspecified: Secondary | ICD-10-CM | POA: Diagnosis not present

## 2018-10-25 DIAGNOSIS — S22089A Unspecified fracture of T11-T12 vertebra, initial encounter for closed fracture: Secondary | ICD-10-CM | POA: Diagnosis not present

## 2018-10-28 DIAGNOSIS — M79605 Pain in left leg: Secondary | ICD-10-CM | POA: Diagnosis not present

## 2018-10-28 DIAGNOSIS — S32592D Other specified fracture of left pubis, subsequent encounter for fracture with routine healing: Secondary | ICD-10-CM | POA: Diagnosis not present

## 2018-10-28 DIAGNOSIS — Z9181 History of falling: Secondary | ICD-10-CM | POA: Diagnosis not present

## 2018-10-28 DIAGNOSIS — R911 Solitary pulmonary nodule: Secondary | ICD-10-CM | POA: Diagnosis not present

## 2018-10-28 DIAGNOSIS — S22089A Unspecified fracture of T11-T12 vertebra, initial encounter for closed fracture: Secondary | ICD-10-CM | POA: Diagnosis not present

## 2018-10-28 DIAGNOSIS — I2510 Atherosclerotic heart disease of native coronary artery without angina pectoris: Secondary | ICD-10-CM | POA: Diagnosis not present

## 2018-10-28 DIAGNOSIS — I779 Disorder of arteries and arterioles, unspecified: Secondary | ICD-10-CM | POA: Diagnosis not present

## 2018-10-28 DIAGNOSIS — E785 Hyperlipidemia, unspecified: Secondary | ICD-10-CM | POA: Diagnosis not present

## 2018-10-28 DIAGNOSIS — M6281 Muscle weakness (generalized): Secondary | ICD-10-CM | POA: Diagnosis not present

## 2018-10-28 DIAGNOSIS — S22088D Other fracture of T11-T12 vertebra, subsequent encounter for fracture with routine healing: Secondary | ICD-10-CM | POA: Diagnosis not present

## 2018-10-28 DIAGNOSIS — R2681 Unsteadiness on feet: Secondary | ICD-10-CM | POA: Diagnosis not present

## 2018-10-28 DIAGNOSIS — E559 Vitamin D deficiency, unspecified: Secondary | ICD-10-CM | POA: Diagnosis not present

## 2018-10-28 DIAGNOSIS — Z96642 Presence of left artificial hip joint: Secondary | ICD-10-CM | POA: Diagnosis not present

## 2018-10-28 DIAGNOSIS — R569 Unspecified convulsions: Secondary | ICD-10-CM | POA: Diagnosis not present

## 2018-10-28 DIAGNOSIS — E039 Hypothyroidism, unspecified: Secondary | ICD-10-CM | POA: Diagnosis not present

## 2018-10-28 DIAGNOSIS — K5900 Constipation, unspecified: Secondary | ICD-10-CM | POA: Diagnosis not present

## 2018-10-28 DIAGNOSIS — R2689 Other abnormalities of gait and mobility: Secondary | ICD-10-CM | POA: Diagnosis not present

## 2018-10-28 DIAGNOSIS — S32592A Other specified fracture of left pubis, initial encounter for closed fracture: Secondary | ICD-10-CM | POA: Diagnosis not present

## 2018-10-31 DIAGNOSIS — I779 Disorder of arteries and arterioles, unspecified: Secondary | ICD-10-CM | POA: Diagnosis not present

## 2018-10-31 DIAGNOSIS — S32592D Other specified fracture of left pubis, subsequent encounter for fracture with routine healing: Secondary | ICD-10-CM | POA: Diagnosis not present

## 2018-10-31 DIAGNOSIS — E039 Hypothyroidism, unspecified: Secondary | ICD-10-CM | POA: Diagnosis not present

## 2018-11-09 ENCOUNTER — Other Ambulatory Visit: Payer: Self-pay | Admitting: Family Medicine

## 2018-11-10 ENCOUNTER — Encounter: Payer: Self-pay | Admitting: Family Medicine

## 2018-11-11 DIAGNOSIS — W19XXXD Unspecified fall, subsequent encounter: Secondary | ICD-10-CM | POA: Diagnosis not present

## 2018-11-11 DIAGNOSIS — Z9181 History of falling: Secondary | ICD-10-CM | POA: Diagnosis not present

## 2018-11-11 DIAGNOSIS — I2510 Atherosclerotic heart disease of native coronary artery without angina pectoris: Secondary | ICD-10-CM | POA: Diagnosis not present

## 2018-11-11 DIAGNOSIS — Z87891 Personal history of nicotine dependence: Secondary | ICD-10-CM | POA: Diagnosis not present

## 2018-11-11 DIAGNOSIS — M1990 Unspecified osteoarthritis, unspecified site: Secondary | ICD-10-CM | POA: Diagnosis not present

## 2018-11-11 DIAGNOSIS — R911 Solitary pulmonary nodule: Secondary | ICD-10-CM | POA: Diagnosis not present

## 2018-11-11 DIAGNOSIS — R42 Dizziness and giddiness: Secondary | ICD-10-CM | POA: Diagnosis not present

## 2018-11-11 DIAGNOSIS — E039 Hypothyroidism, unspecified: Secondary | ICD-10-CM | POA: Diagnosis not present

## 2018-11-11 DIAGNOSIS — E785 Hyperlipidemia, unspecified: Secondary | ICD-10-CM | POA: Diagnosis not present

## 2018-11-11 DIAGNOSIS — S32592D Other specified fracture of left pubis, subsequent encounter for fracture with routine healing: Secondary | ICD-10-CM | POA: Diagnosis not present

## 2018-11-11 DIAGNOSIS — I6529 Occlusion and stenosis of unspecified carotid artery: Secondary | ICD-10-CM | POA: Diagnosis not present

## 2018-11-11 DIAGNOSIS — Z96642 Presence of left artificial hip joint: Secondary | ICD-10-CM | POA: Diagnosis not present

## 2018-11-11 DIAGNOSIS — E559 Vitamin D deficiency, unspecified: Secondary | ICD-10-CM | POA: Diagnosis not present

## 2018-11-11 DIAGNOSIS — S22088D Other fracture of T11-T12 vertebra, subsequent encounter for fracture with routine healing: Secondary | ICD-10-CM | POA: Diagnosis not present

## 2018-11-12 DIAGNOSIS — F319 Bipolar disorder, unspecified: Secondary | ICD-10-CM

## 2018-11-12 DIAGNOSIS — S22088D Other fracture of T11-T12 vertebra, subsequent encounter for fracture with routine healing: Secondary | ICD-10-CM | POA: Diagnosis not present

## 2018-11-12 DIAGNOSIS — R911 Solitary pulmonary nodule: Secondary | ICD-10-CM

## 2018-11-12 DIAGNOSIS — S32592D Other specified fracture of left pubis, subsequent encounter for fracture with routine healing: Secondary | ICD-10-CM | POA: Diagnosis not present

## 2018-11-12 DIAGNOSIS — Z9181 History of falling: Secondary | ICD-10-CM

## 2018-11-12 DIAGNOSIS — R42 Dizziness and giddiness: Secondary | ICD-10-CM | POA: Diagnosis not present

## 2018-11-12 DIAGNOSIS — I2510 Atherosclerotic heart disease of native coronary artery without angina pectoris: Secondary | ICD-10-CM

## 2018-11-12 DIAGNOSIS — E785 Hyperlipidemia, unspecified: Secondary | ICD-10-CM

## 2018-11-12 DIAGNOSIS — I6529 Occlusion and stenosis of unspecified carotid artery: Secondary | ICD-10-CM | POA: Diagnosis not present

## 2018-11-12 DIAGNOSIS — W19XXXD Unspecified fall, subsequent encounter: Secondary | ICD-10-CM | POA: Diagnosis not present

## 2018-11-12 DIAGNOSIS — E559 Vitamin D deficiency, unspecified: Secondary | ICD-10-CM

## 2018-11-12 DIAGNOSIS — Z87891 Personal history of nicotine dependence: Secondary | ICD-10-CM

## 2018-11-12 DIAGNOSIS — E039 Hypothyroidism, unspecified: Secondary | ICD-10-CM

## 2018-11-12 DIAGNOSIS — Z96642 Presence of left artificial hip joint: Secondary | ICD-10-CM

## 2018-11-12 DIAGNOSIS — M1990 Unspecified osteoarthritis, unspecified site: Secondary | ICD-10-CM

## 2018-11-14 ENCOUNTER — Encounter: Payer: Self-pay | Admitting: Family Medicine

## 2018-11-14 ENCOUNTER — Ambulatory Visit (INDEPENDENT_AMBULATORY_CARE_PROVIDER_SITE_OTHER): Payer: Medicare Other | Admitting: Family Medicine

## 2018-11-14 ENCOUNTER — Other Ambulatory Visit: Payer: Self-pay

## 2018-11-14 DIAGNOSIS — S329XXD Fracture of unspecified parts of lumbosacral spine and pelvis, subsequent encounter for fracture with routine healing: Secondary | ICD-10-CM | POA: Diagnosis not present

## 2018-11-14 MED ORDER — EZETIMIBE 10 MG PO TABS: 10.0000 mg | ORAL_TABLET | Freq: Every day | ORAL | 0 refills | Status: DC

## 2018-11-14 NOTE — Assessment & Plan Note (Signed)
Seems to be healing normally.  Advised patient that she would likely have pain for several more weeks.  She will continue working with home PT and OT.  She will continue using Percocet as needed for pain.  Discussed reasons to return to care.

## 2018-11-14 NOTE — Progress Notes (Addendum)
    Chief Complaint:  Shari Horn is a 78 y.o. female who presents for a telephone visit with a chief complaint of hospital follow-up for pubic ramus fracture.   Assessment/Plan:  Pelvic fracture (HCC) Seems to be healing normally.  Advised patient that she would likely have pain for several more weeks.  She will continue working with home PT and OT.  She will continue using Percocet as needed for pain.  Discussed reasons to return to care.    Subjective:  HPI:  Patient admitted to the hospital 10/18/2018 after suffering a fall.  In emergency room, she was found to have a left inferior pubic ramus fracture and was admitted for pain management.  She was evaluated by PT and OT who recommended that she be discharged to SNF to continue to work on rehab.  She was discharged to rehab on 10/22/2018.  Patient was discharged from SNF on 11/09/18. Since being home, she has done well. She is able to ambulate to the bathroom and to the kitchen. Her pain has been manageable. It is worse with activity. She is taking a calcium and vitamin D supplement. She had been taking Percocet as needed for pain.  This has been doing well.  She still has some pain with certain activities. She has friends and family who have been helping her. PT and OT have been visiting her at home.   No weakness or numbness.  No constipation.  No nausea or vomiting.  ROS: Per HPI  PMH: She reports that she quit smoking about 45 years ago. Her smoking use included cigarettes. She quit after 10.00 years of use. She has never used smokeless tobacco. She reports that she does not drink alcohol or use drugs.      Objective/Observations   NAD  Telephone Visit   I connected with Shari Horn on 11/14/18 at 11:00 AM EDT via telephone and verified that I am speaking with the correct person using two identifiers. I discussed the limitations of evaluation and management by telemedicine and the availability of in person appointments. The patient  expressed understanding and agreed to proceed.   Patient location: Home Provider location: [place] participating in the virtual visit: Myself and Patient  Time Spent: I spent 15 minutes in medical discussion with the patient.       Algis Greenhouse. Jerline Pain, MD 11/14/2018 10:57 AM

## 2018-11-15 DIAGNOSIS — Z96642 Presence of left artificial hip joint: Secondary | ICD-10-CM | POA: Diagnosis not present

## 2018-11-15 DIAGNOSIS — W19XXXD Unspecified fall, subsequent encounter: Secondary | ICD-10-CM | POA: Diagnosis not present

## 2018-11-15 DIAGNOSIS — I2510 Atherosclerotic heart disease of native coronary artery without angina pectoris: Secondary | ICD-10-CM | POA: Diagnosis not present

## 2018-11-15 DIAGNOSIS — S32592D Other specified fracture of left pubis, subsequent encounter for fracture with routine healing: Secondary | ICD-10-CM | POA: Diagnosis not present

## 2018-11-15 DIAGNOSIS — M1990 Unspecified osteoarthritis, unspecified site: Secondary | ICD-10-CM | POA: Diagnosis not present

## 2018-11-15 DIAGNOSIS — Z9181 History of falling: Secondary | ICD-10-CM | POA: Diagnosis not present

## 2018-11-15 DIAGNOSIS — E559 Vitamin D deficiency, unspecified: Secondary | ICD-10-CM | POA: Diagnosis not present

## 2018-11-15 DIAGNOSIS — R911 Solitary pulmonary nodule: Secondary | ICD-10-CM | POA: Diagnosis not present

## 2018-11-15 DIAGNOSIS — I6529 Occlusion and stenosis of unspecified carotid artery: Secondary | ICD-10-CM | POA: Diagnosis not present

## 2018-11-15 DIAGNOSIS — R42 Dizziness and giddiness: Secondary | ICD-10-CM | POA: Diagnosis not present

## 2018-11-15 DIAGNOSIS — E785 Hyperlipidemia, unspecified: Secondary | ICD-10-CM | POA: Diagnosis not present

## 2018-11-15 DIAGNOSIS — S22088D Other fracture of T11-T12 vertebra, subsequent encounter for fracture with routine healing: Secondary | ICD-10-CM | POA: Diagnosis not present

## 2018-11-15 DIAGNOSIS — E039 Hypothyroidism, unspecified: Secondary | ICD-10-CM | POA: Diagnosis not present

## 2018-11-15 DIAGNOSIS — Z87891 Personal history of nicotine dependence: Secondary | ICD-10-CM | POA: Diagnosis not present

## 2018-11-16 ENCOUNTER — Telehealth: Payer: Self-pay

## 2018-11-16 DIAGNOSIS — R42 Dizziness and giddiness: Secondary | ICD-10-CM | POA: Diagnosis not present

## 2018-11-16 DIAGNOSIS — I2510 Atherosclerotic heart disease of native coronary artery without angina pectoris: Secondary | ICD-10-CM | POA: Diagnosis not present

## 2018-11-16 DIAGNOSIS — I6529 Occlusion and stenosis of unspecified carotid artery: Secondary | ICD-10-CM | POA: Diagnosis not present

## 2018-11-16 DIAGNOSIS — R911 Solitary pulmonary nodule: Secondary | ICD-10-CM | POA: Diagnosis not present

## 2018-11-16 DIAGNOSIS — E039 Hypothyroidism, unspecified: Secondary | ICD-10-CM | POA: Diagnosis not present

## 2018-11-16 DIAGNOSIS — S32592D Other specified fracture of left pubis, subsequent encounter for fracture with routine healing: Secondary | ICD-10-CM | POA: Diagnosis not present

## 2018-11-16 DIAGNOSIS — Z9181 History of falling: Secondary | ICD-10-CM | POA: Diagnosis not present

## 2018-11-16 DIAGNOSIS — W19XXXD Unspecified fall, subsequent encounter: Secondary | ICD-10-CM | POA: Diagnosis not present

## 2018-11-16 DIAGNOSIS — Z87891 Personal history of nicotine dependence: Secondary | ICD-10-CM | POA: Diagnosis not present

## 2018-11-16 DIAGNOSIS — Z96642 Presence of left artificial hip joint: Secondary | ICD-10-CM | POA: Diagnosis not present

## 2018-11-16 DIAGNOSIS — M1990 Unspecified osteoarthritis, unspecified site: Secondary | ICD-10-CM | POA: Diagnosis not present

## 2018-11-16 DIAGNOSIS — E559 Vitamin D deficiency, unspecified: Secondary | ICD-10-CM | POA: Diagnosis not present

## 2018-11-16 DIAGNOSIS — S22088D Other fracture of T11-T12 vertebra, subsequent encounter for fracture with routine healing: Secondary | ICD-10-CM | POA: Diagnosis not present

## 2018-11-16 DIAGNOSIS — E785 Hyperlipidemia, unspecified: Secondary | ICD-10-CM | POA: Diagnosis not present

## 2018-11-16 NOTE — Telephone Encounter (Signed)
Called [place].  Verbal given.

## 2018-11-16 NOTE — Telephone Encounter (Signed)
Left VM giving verbal orders for PT.

## 2018-11-16 NOTE — Telephone Encounter (Signed)
Copied from [place] [id number]. Topic: Quick Communication - Home Health Verbal Orders >> Nov 16, 2018 12:49 PM Virl Axe D wrote: Caller/Agency: Monique/PT/[facility] Callback Number: [phone] Requesting OT/PT/Skilled Nursing/Social Work/Speech Therapy: PT Frequency: 1 week 1/ 2 week 4/ 1 week 2

## 2018-11-16 NOTE — Telephone Encounter (Signed)
Copied from [place] [id number]. Topic: Quick Communication - Home Health Verbal Orders >> Nov 16, 2018 12:50 PM Carolyn Stare wrote: Shari Horn with [facility] Number [id number] verbal OT  an the home  Frequency 2 x  3    1 x 1

## 2018-11-18 ENCOUNTER — Telehealth: Payer: Self-pay | Admitting: Family Medicine

## 2018-11-18 DIAGNOSIS — I2510 Atherosclerotic heart disease of native coronary artery without angina pectoris: Secondary | ICD-10-CM | POA: Diagnosis not present

## 2018-11-18 DIAGNOSIS — R911 Solitary pulmonary nodule: Secondary | ICD-10-CM | POA: Diagnosis not present

## 2018-11-18 DIAGNOSIS — I6529 Occlusion and stenosis of unspecified carotid artery: Secondary | ICD-10-CM | POA: Diagnosis not present

## 2018-11-18 DIAGNOSIS — E039 Hypothyroidism, unspecified: Secondary | ICD-10-CM | POA: Diagnosis not present

## 2018-11-18 DIAGNOSIS — S22088D Other fracture of T11-T12 vertebra, subsequent encounter for fracture with routine healing: Secondary | ICD-10-CM | POA: Diagnosis not present

## 2018-11-18 DIAGNOSIS — E559 Vitamin D deficiency, unspecified: Secondary | ICD-10-CM | POA: Diagnosis not present

## 2018-11-18 DIAGNOSIS — Z96642 Presence of left artificial hip joint: Secondary | ICD-10-CM | POA: Diagnosis not present

## 2018-11-18 DIAGNOSIS — Z87891 Personal history of nicotine dependence: Secondary | ICD-10-CM | POA: Diagnosis not present

## 2018-11-18 DIAGNOSIS — W19XXXD Unspecified fall, subsequent encounter: Secondary | ICD-10-CM | POA: Diagnosis not present

## 2018-11-18 DIAGNOSIS — M1990 Unspecified osteoarthritis, unspecified site: Secondary | ICD-10-CM | POA: Diagnosis not present

## 2018-11-18 DIAGNOSIS — E785 Hyperlipidemia, unspecified: Secondary | ICD-10-CM | POA: Diagnosis not present

## 2018-11-18 DIAGNOSIS — R42 Dizziness and giddiness: Secondary | ICD-10-CM | POA: Diagnosis not present

## 2018-11-18 DIAGNOSIS — Z9181 History of falling: Secondary | ICD-10-CM | POA: Diagnosis not present

## 2018-11-18 DIAGNOSIS — S32592D Other specified fracture of left pubis, subsequent encounter for fracture with routine healing: Secondary | ICD-10-CM | POA: Diagnosis not present

## 2018-11-18 NOTE — Telephone Encounter (Signed)
Note has been faxed.

## 2018-11-18 NOTE — Telephone Encounter (Signed)
See note

## 2018-11-18 NOTE — Telephone Encounter (Signed)
Caller name: Dolores Lory  Relation to pt: Medical illustrator from [employer] back number: [phone] / fax # [phone]    Reason for call:  Requesting office notes for 11/14/2018  Please fax

## 2018-11-21 ENCOUNTER — Telehealth: Payer: Self-pay | Admitting: Family Medicine

## 2018-11-21 NOTE — Telephone Encounter (Signed)
See note  Copied from [place] [phone]. Topic: General - Other >> Nov 21, 2018  1:21 PM Ivar Drape wrote: Reason for CRM: Ramond Marrow w/[facility] [phone] stated that the patient's HFU appt should have been Virtual for her insurance to pay for it.  She made another appt for the patient and asks if Dr. Jerline Pain can do a minimal new visit or add an addendum to his 11/14/18 appt saying that the appt needed to be redone as a virtual appt.  Patient's son, Verdon Cummins [phone] will be there to help his mother with the Virtual appt.

## 2018-11-22 DIAGNOSIS — I6529 Occlusion and stenosis of unspecified carotid artery: Secondary | ICD-10-CM | POA: Diagnosis not present

## 2018-11-22 DIAGNOSIS — M1990 Unspecified osteoarthritis, unspecified site: Secondary | ICD-10-CM | POA: Diagnosis not present

## 2018-11-22 DIAGNOSIS — E559 Vitamin D deficiency, unspecified: Secondary | ICD-10-CM | POA: Diagnosis not present

## 2018-11-22 DIAGNOSIS — Z87891 Personal history of nicotine dependence: Secondary | ICD-10-CM | POA: Diagnosis not present

## 2018-11-22 DIAGNOSIS — I2510 Atherosclerotic heart disease of native coronary artery without angina pectoris: Secondary | ICD-10-CM | POA: Diagnosis not present

## 2018-11-22 DIAGNOSIS — S32592D Other specified fracture of left pubis, subsequent encounter for fracture with routine healing: Secondary | ICD-10-CM | POA: Diagnosis not present

## 2018-11-22 DIAGNOSIS — R42 Dizziness and giddiness: Secondary | ICD-10-CM | POA: Diagnosis not present

## 2018-11-22 DIAGNOSIS — W19XXXD Unspecified fall, subsequent encounter: Secondary | ICD-10-CM | POA: Diagnosis not present

## 2018-11-22 DIAGNOSIS — E039 Hypothyroidism, unspecified: Secondary | ICD-10-CM | POA: Diagnosis not present

## 2018-11-22 DIAGNOSIS — S22088D Other fracture of T11-T12 vertebra, subsequent encounter for fracture with routine healing: Secondary | ICD-10-CM | POA: Diagnosis not present

## 2018-11-22 DIAGNOSIS — R911 Solitary pulmonary nodule: Secondary | ICD-10-CM | POA: Diagnosis not present

## 2018-11-22 DIAGNOSIS — Z9181 History of falling: Secondary | ICD-10-CM | POA: Diagnosis not present

## 2018-11-22 DIAGNOSIS — Z96642 Presence of left artificial hip joint: Secondary | ICD-10-CM | POA: Diagnosis not present

## 2018-11-22 DIAGNOSIS — E785 Hyperlipidemia, unspecified: Secondary | ICD-10-CM | POA: Diagnosis not present

## 2018-11-22 NOTE — Telephone Encounter (Signed)
Please advise 

## 2018-11-22 NOTE — Telephone Encounter (Signed)
Recommend that they contact billing department. We cannot do a TCM visit charge as it has been >14 days since discharge. The appointment was billed as a [phone] which is allowed for telephone visits under [facility].

## 2018-11-23 ENCOUNTER — Other Ambulatory Visit: Payer: Self-pay | Admitting: Family Medicine

## 2018-11-24 DIAGNOSIS — S32592D Other specified fracture of left pubis, subsequent encounter for fracture with routine healing: Secondary | ICD-10-CM | POA: Diagnosis not present

## 2018-11-24 DIAGNOSIS — Z96642 Presence of left artificial hip joint: Secondary | ICD-10-CM | POA: Diagnosis not present

## 2018-11-24 DIAGNOSIS — E559 Vitamin D deficiency, unspecified: Secondary | ICD-10-CM | POA: Diagnosis not present

## 2018-11-24 DIAGNOSIS — E039 Hypothyroidism, unspecified: Secondary | ICD-10-CM | POA: Diagnosis not present

## 2018-11-24 DIAGNOSIS — E785 Hyperlipidemia, unspecified: Secondary | ICD-10-CM | POA: Diagnosis not present

## 2018-11-24 DIAGNOSIS — W19XXXD Unspecified fall, subsequent encounter: Secondary | ICD-10-CM | POA: Diagnosis not present

## 2018-11-24 DIAGNOSIS — S22088D Other fracture of T11-T12 vertebra, subsequent encounter for fracture with routine healing: Secondary | ICD-10-CM | POA: Diagnosis not present

## 2018-11-24 DIAGNOSIS — M1990 Unspecified osteoarthritis, unspecified site: Secondary | ICD-10-CM | POA: Diagnosis not present

## 2018-11-24 DIAGNOSIS — Z9181 History of falling: Secondary | ICD-10-CM | POA: Diagnosis not present

## 2018-11-24 DIAGNOSIS — I2510 Atherosclerotic heart disease of native coronary artery without angina pectoris: Secondary | ICD-10-CM | POA: Diagnosis not present

## 2018-11-24 DIAGNOSIS — I6529 Occlusion and stenosis of unspecified carotid artery: Secondary | ICD-10-CM | POA: Diagnosis not present

## 2018-11-24 DIAGNOSIS — R42 Dizziness and giddiness: Secondary | ICD-10-CM | POA: Diagnosis not present

## 2018-11-24 DIAGNOSIS — R911 Solitary pulmonary nodule: Secondary | ICD-10-CM | POA: Diagnosis not present

## 2018-11-24 DIAGNOSIS — Z87891 Personal history of nicotine dependence: Secondary | ICD-10-CM | POA: Diagnosis not present

## 2018-11-25 ENCOUNTER — Other Ambulatory Visit: Payer: Self-pay

## 2018-11-25 MED ORDER — LEVOTHYROXINE SODIUM 75 MCG PO TABS: ORAL_TABLET | ORAL | 1 refills | Status: DC

## 2018-11-28 NOTE — Telephone Encounter (Signed)
LVM for Angie to contact billing and provided the message by Dr. Jerline Pain. No need to route note, for documentation purposes only

## 2018-11-30 DIAGNOSIS — Z9181 History of falling: Secondary | ICD-10-CM | POA: Diagnosis not present

## 2018-11-30 DIAGNOSIS — M1990 Unspecified osteoarthritis, unspecified site: Secondary | ICD-10-CM | POA: Diagnosis not present

## 2018-11-30 DIAGNOSIS — S32592D Other specified fracture of left pubis, subsequent encounter for fracture with routine healing: Secondary | ICD-10-CM | POA: Diagnosis not present

## 2018-11-30 DIAGNOSIS — E039 Hypothyroidism, unspecified: Secondary | ICD-10-CM | POA: Diagnosis not present

## 2018-11-30 DIAGNOSIS — Z96642 Presence of left artificial hip joint: Secondary | ICD-10-CM | POA: Diagnosis not present

## 2018-11-30 DIAGNOSIS — Z87891 Personal history of nicotine dependence: Secondary | ICD-10-CM | POA: Diagnosis not present

## 2018-11-30 DIAGNOSIS — E559 Vitamin D deficiency, unspecified: Secondary | ICD-10-CM | POA: Diagnosis not present

## 2018-11-30 DIAGNOSIS — W19XXXD Unspecified fall, subsequent encounter: Secondary | ICD-10-CM | POA: Diagnosis not present

## 2018-11-30 DIAGNOSIS — I2510 Atherosclerotic heart disease of native coronary artery without angina pectoris: Secondary | ICD-10-CM | POA: Diagnosis not present

## 2018-11-30 DIAGNOSIS — I6529 Occlusion and stenosis of unspecified carotid artery: Secondary | ICD-10-CM | POA: Diagnosis not present

## 2018-11-30 DIAGNOSIS — E785 Hyperlipidemia, unspecified: Secondary | ICD-10-CM | POA: Diagnosis not present

## 2018-11-30 DIAGNOSIS — R42 Dizziness and giddiness: Secondary | ICD-10-CM | POA: Diagnosis not present

## 2018-11-30 DIAGNOSIS — R911 Solitary pulmonary nodule: Secondary | ICD-10-CM | POA: Diagnosis not present

## 2018-11-30 DIAGNOSIS — S22088D Other fracture of T11-T12 vertebra, subsequent encounter for fracture with routine healing: Secondary | ICD-10-CM | POA: Diagnosis not present

## 2018-12-01 ENCOUNTER — Telehealth: Payer: Self-pay | Admitting: Family Medicine

## 2018-12-01 DIAGNOSIS — E039 Hypothyroidism, unspecified: Secondary | ICD-10-CM | POA: Diagnosis not present

## 2018-12-01 DIAGNOSIS — I6529 Occlusion and stenosis of unspecified carotid artery: Secondary | ICD-10-CM | POA: Diagnosis not present

## 2018-12-01 DIAGNOSIS — I2510 Atherosclerotic heart disease of native coronary artery without angina pectoris: Secondary | ICD-10-CM | POA: Diagnosis not present

## 2018-12-01 DIAGNOSIS — E559 Vitamin D deficiency, unspecified: Secondary | ICD-10-CM | POA: Diagnosis not present

## 2018-12-01 DIAGNOSIS — W19XXXD Unspecified fall, subsequent encounter: Secondary | ICD-10-CM | POA: Diagnosis not present

## 2018-12-01 DIAGNOSIS — E785 Hyperlipidemia, unspecified: Secondary | ICD-10-CM | POA: Diagnosis not present

## 2018-12-01 DIAGNOSIS — Z87891 Personal history of nicotine dependence: Secondary | ICD-10-CM | POA: Diagnosis not present

## 2018-12-01 DIAGNOSIS — Z9181 History of falling: Secondary | ICD-10-CM | POA: Diagnosis not present

## 2018-12-01 DIAGNOSIS — M1990 Unspecified osteoarthritis, unspecified site: Secondary | ICD-10-CM | POA: Diagnosis not present

## 2018-12-01 DIAGNOSIS — S32592D Other specified fracture of left pubis, subsequent encounter for fracture with routine healing: Secondary | ICD-10-CM | POA: Diagnosis not present

## 2018-12-01 DIAGNOSIS — R42 Dizziness and giddiness: Secondary | ICD-10-CM | POA: Diagnosis not present

## 2018-12-01 DIAGNOSIS — Z96642 Presence of left artificial hip joint: Secondary | ICD-10-CM | POA: Diagnosis not present

## 2018-12-01 DIAGNOSIS — R911 Solitary pulmonary nodule: Secondary | ICD-10-CM | POA: Diagnosis not present

## 2018-12-01 DIAGNOSIS — S22088D Other fracture of T11-T12 vertebra, subsequent encounter for fracture with routine healing: Secondary | ICD-10-CM | POA: Diagnosis not present

## 2018-12-01 NOTE — Telephone Encounter (Signed)
Copied from [place] [phone]. Topic: General - Other >> Dec 01, 2018  4:28 PM Keene Breath wrote: Reason for CRM: Called to move patient's visit tomorrow to the end of plan of care because patient has other appts. And will not be up to visit tomorrow.  Please advise and call to discuss at [phone]

## 2018-12-01 NOTE — Telephone Encounter (Signed)
See below

## 2018-12-02 DIAGNOSIS — S32591A Other specified fracture of right pubis, initial encounter for closed fracture: Secondary | ICD-10-CM | POA: Diagnosis not present

## 2018-12-02 DIAGNOSIS — M7061 Trochanteric bursitis, right hip: Secondary | ICD-10-CM | POA: Diagnosis not present

## 2018-12-02 DIAGNOSIS — S32511A Fracture of superior rim of right pubis, initial encounter for closed fracture: Secondary | ICD-10-CM | POA: Diagnosis not present

## 2018-12-02 DIAGNOSIS — Z471 Aftercare following joint replacement surgery: Secondary | ICD-10-CM | POA: Diagnosis not present

## 2018-12-02 DIAGNOSIS — Z96642 Presence of left artificial hip joint: Secondary | ICD-10-CM | POA: Diagnosis not present

## 2018-12-02 NOTE — Telephone Encounter (Signed)
Called [phone] to see about pt's appts, No answer, LVM to have them call back if anything needed to be rescheduled.

## 2018-12-06 ENCOUNTER — Other Ambulatory Visit: Payer: Self-pay | Admitting: Orthopedic Surgery

## 2018-12-06 ENCOUNTER — Telehealth: Payer: Self-pay | Admitting: Family Medicine

## 2018-12-06 DIAGNOSIS — E039 Hypothyroidism, unspecified: Secondary | ICD-10-CM | POA: Diagnosis not present

## 2018-12-06 DIAGNOSIS — S32592D Other specified fracture of left pubis, subsequent encounter for fracture with routine healing: Secondary | ICD-10-CM | POA: Diagnosis not present

## 2018-12-06 DIAGNOSIS — I2510 Atherosclerotic heart disease of native coronary artery without angina pectoris: Secondary | ICD-10-CM | POA: Diagnosis not present

## 2018-12-06 DIAGNOSIS — M1990 Unspecified osteoarthritis, unspecified site: Secondary | ICD-10-CM | POA: Diagnosis not present

## 2018-12-06 DIAGNOSIS — W19XXXD Unspecified fall, subsequent encounter: Secondary | ICD-10-CM | POA: Diagnosis not present

## 2018-12-06 DIAGNOSIS — Z87891 Personal history of nicotine dependence: Secondary | ICD-10-CM | POA: Diagnosis not present

## 2018-12-06 DIAGNOSIS — E559 Vitamin D deficiency, unspecified: Secondary | ICD-10-CM | POA: Diagnosis not present

## 2018-12-06 DIAGNOSIS — R42 Dizziness and giddiness: Secondary | ICD-10-CM | POA: Diagnosis not present

## 2018-12-06 DIAGNOSIS — R911 Solitary pulmonary nodule: Secondary | ICD-10-CM | POA: Diagnosis not present

## 2018-12-06 DIAGNOSIS — S32511A Fracture of superior rim of right pubis, initial encounter for closed fracture: Secondary | ICD-10-CM

## 2018-12-06 DIAGNOSIS — Z9181 History of falling: Secondary | ICD-10-CM | POA: Diagnosis not present

## 2018-12-06 DIAGNOSIS — E785 Hyperlipidemia, unspecified: Secondary | ICD-10-CM | POA: Diagnosis not present

## 2018-12-06 DIAGNOSIS — Z96642 Presence of left artificial hip joint: Secondary | ICD-10-CM | POA: Diagnosis not present

## 2018-12-06 DIAGNOSIS — I6529 Occlusion and stenosis of unspecified carotid artery: Secondary | ICD-10-CM | POA: Diagnosis not present

## 2018-12-06 DIAGNOSIS — S22088D Other fracture of T11-T12 vertebra, subsequent encounter for fracture with routine healing: Secondary | ICD-10-CM | POA: Diagnosis not present

## 2018-12-06 NOTE — Telephone Encounter (Signed)
Called [place].  Verbal order given.

## 2018-12-06 NOTE — Telephone Encounter (Signed)
See note

## 2018-12-06 NOTE — Telephone Encounter (Signed)
[facility] calling, this pt has been treated for a pelvic fracture, Nanine Means has been providing OT/PT pt states she is in so much pain that she is going to have a pelvic scan to see what is going on and pt is requesting to place her therapy on hold, Nanine Means is wanting verbal order back to acknowledge pt is placing therapy on hold leave message on [phone]

## 2018-12-09 ENCOUNTER — Ambulatory Visit (INDEPENDENT_AMBULATORY_CARE_PROVIDER_SITE_OTHER): Payer: Medicare Other | Admitting: Family Medicine

## 2018-12-09 DIAGNOSIS — M8580 Other specified disorders of bone density and structure, unspecified site: Secondary | ICD-10-CM

## 2018-12-09 DIAGNOSIS — S329XXD Fracture of unspecified parts of lumbosacral spine and pelvis, subsequent encounter for fracture with routine healing: Secondary | ICD-10-CM | POA: Diagnosis not present

## 2018-12-09 NOTE — Progress Notes (Signed)
   Chief Complaint:  Shari Horn is a 78 y.o. female who presents today for a virtual office visit with a chief complaint of pelvic pain.   Assessment/Plan:  Pelvic Pain s/p pelvic fracture Seems to be worsening.  Will place referral for her to continue home health PT/OT.  We will continue pain regimen as prescribed by her orthopedist including tramadol, Tylenol, and naproxen.  Advised her to call her orthopedist for an appointment soon given worsening pain.    Subjective:  HPI:  Pelvic Pain  Suffered a pelvic fracture about 6 weeks ago. Was admitted to the hospital and discharged to SNF.  She has been working with home health for physical therapy and Occupational Therapy.  Both of these is helped.  She is also been following with orthopedics.  Pain is unfortunate but worsening over the past week or so.  She has currently taking oxycodone.  She was prescribed tramadol which she will start when she runs out of oxycodone.  She is taking Tylenol 1000 g 3 times daily naproxen 440 mg twice daily.   ROS: Per HPI  PMH: She reports that she quit smoking about 46 years ago. Her smoking use included cigarettes. She quit after 10.00 years of use. She has never used smokeless tobacco. She reports that she does not drink alcohol or use drugs.      Objective/Observations  Physical Exam: Gen: NAD, resting comfortably Pulm: Normal work of breathing Neuro: Grossly normal, moves all extremities Psych: Normal affect and thought content  Virtual Visit via Video   I connected with Shari Horn on 12/09/18 at  1:20 PM EST by a video enabled telemedicine application and verified that I am speaking with the correct person using two identifiers. I discussed the limitations of evaluation and management by telemedicine and the availability of in person appointments. The patient expressed understanding and agreed to proceed.   Patient location: Home Provider location: [place]  participating in the virtual visit: Myself and Patient     Algis Greenhouse. Jerline Pain, MD 12/09/2018 2:17 PM

## 2018-12-12 ENCOUNTER — Telehealth: Payer: Self-pay | Admitting: Family Medicine

## 2018-12-12 NOTE — Telephone Encounter (Signed)
Home Health Verbal Orders - Caller/Agency: [facility] Bud Number: [phone] Requesting OT/PT/Skilled Nursing/Social Work/Speech Therapy: Pt has requested to postpone visits for OT & PT until after visit to orthopedist. Pt did not provide date for appt. CAT scan scheduled for tomorrow.  Frequency:

## 2018-12-12 NOTE — Telephone Encounter (Signed)
See note

## 2018-12-12 NOTE — Telephone Encounter (Signed)
Notified Sharyn Lull ok for verbal order

## 2018-12-13 ENCOUNTER — Ambulatory Visit
Admission: RE | Admit: 2018-12-13 | Discharge: 2018-12-13 | Disposition: A | Discharge status: Home / Self Care | Payer: Medicare Other | Source: Ambulatory Visit | Attending: Orthopedic Surgery | Admitting: Orthopedic Surgery

## 2018-12-13 DIAGNOSIS — S32512A Fracture of superior rim of left pubis, initial encounter for closed fracture: Secondary | ICD-10-CM | POA: Diagnosis not present

## 2018-12-13 DIAGNOSIS — S32511A Fracture of superior rim of right pubis, initial encounter for closed fracture: Secondary | ICD-10-CM

## 2018-12-20 ENCOUNTER — Telehealth: Payer: Self-pay | Admitting: Family Medicine

## 2018-12-20 NOTE — Telephone Encounter (Signed)
Copied from [place] [phone]. Topic: General - Other >> Dec 20, 2018 12:23 PM Sheran Luz wrote: Shari Horn [facility] calling as FYI to let office know they're faxing over forms for Dr. Jerline Pain to fill out for [facility] 11/20.

## 2018-12-22 ENCOUNTER — Telehealth: Payer: Self-pay | Admitting: Family Medicine

## 2018-12-22 NOTE — Telephone Encounter (Signed)
Copied from [place] [phone]. Topic: General - Inquiry >> Dec 22, 2018  2:26 PM Mathis Bud wrote: Reason for CRM: Dolores Lory from [facility] requesting a new Face to face form for date 11/20.  Dolores Lory will refax forms. Call back [phone]

## 2018-12-23 NOTE — Telephone Encounter (Signed)
Form signed and faxed

## 2018-12-26 DIAGNOSIS — Z471 Aftercare following joint replacement surgery: Secondary | ICD-10-CM | POA: Diagnosis not present

## 2018-12-26 DIAGNOSIS — Z96642 Presence of left artificial hip joint: Secondary | ICD-10-CM | POA: Diagnosis not present

## 2018-12-27 ENCOUNTER — Telehealth: Payer: Self-pay | Admitting: Family Medicine

## 2018-12-27 NOTE — Telephone Encounter (Signed)
See note

## 2018-12-27 NOTE — Telephone Encounter (Signed)
Sharyn Lull OT with [facility] is calling and the patient is requesting to hold off OT and PT until next week and needs verbal order to recertify pt for the next certification period starts on 01-10-2019

## 2018-12-28 NOTE — Telephone Encounter (Signed)
Spoke with Sharyn Lull ok for verbal order

## 2019-01-03 DIAGNOSIS — S32592D Other specified fracture of left pubis, subsequent encounter for fracture with routine healing: Secondary | ICD-10-CM | POA: Diagnosis not present

## 2019-01-03 DIAGNOSIS — E039 Hypothyroidism, unspecified: Secondary | ICD-10-CM | POA: Diagnosis not present

## 2019-01-03 DIAGNOSIS — E785 Hyperlipidemia, unspecified: Secondary | ICD-10-CM | POA: Diagnosis not present

## 2019-01-03 DIAGNOSIS — I2510 Atherosclerotic heart disease of native coronary artery without angina pectoris: Secondary | ICD-10-CM | POA: Diagnosis not present

## 2019-01-03 DIAGNOSIS — W19XXXD Unspecified fall, subsequent encounter: Secondary | ICD-10-CM | POA: Diagnosis not present

## 2019-01-03 DIAGNOSIS — Z87891 Personal history of nicotine dependence: Secondary | ICD-10-CM | POA: Diagnosis not present

## 2019-01-03 DIAGNOSIS — M1990 Unspecified osteoarthritis, unspecified site: Secondary | ICD-10-CM | POA: Diagnosis not present

## 2019-01-03 DIAGNOSIS — I6529 Occlusion and stenosis of unspecified carotid artery: Secondary | ICD-10-CM | POA: Diagnosis not present

## 2019-01-03 DIAGNOSIS — R42 Dizziness and giddiness: Secondary | ICD-10-CM | POA: Diagnosis not present

## 2019-01-03 DIAGNOSIS — R911 Solitary pulmonary nodule: Secondary | ICD-10-CM | POA: Diagnosis not present

## 2019-01-03 DIAGNOSIS — Z96642 Presence of left artificial hip joint: Secondary | ICD-10-CM | POA: Diagnosis not present

## 2019-01-03 DIAGNOSIS — S22088D Other fracture of T11-T12 vertebra, subsequent encounter for fracture with routine healing: Secondary | ICD-10-CM | POA: Diagnosis not present

## 2019-01-03 DIAGNOSIS — E559 Vitamin D deficiency, unspecified: Secondary | ICD-10-CM | POA: Diagnosis not present

## 2019-01-03 DIAGNOSIS — Z9181 History of falling: Secondary | ICD-10-CM | POA: Diagnosis not present

## 2019-01-03 NOTE — Telephone Encounter (Signed)
Sent to wrong office °

## 2019-01-03 NOTE — Telephone Encounter (Signed)
Form re-faxed

## 2019-01-03 NOTE — Telephone Encounter (Signed)
Shari Horn from [facility] called to report only the cover sheet was received. Please refax entirety of signed order, please advise

## 2019-01-04 DIAGNOSIS — Z9181 History of falling: Secondary | ICD-10-CM | POA: Diagnosis not present

## 2019-01-04 DIAGNOSIS — R911 Solitary pulmonary nodule: Secondary | ICD-10-CM | POA: Diagnosis not present

## 2019-01-04 DIAGNOSIS — E785 Hyperlipidemia, unspecified: Secondary | ICD-10-CM | POA: Diagnosis not present

## 2019-01-04 DIAGNOSIS — E559 Vitamin D deficiency, unspecified: Secondary | ICD-10-CM | POA: Diagnosis not present

## 2019-01-04 DIAGNOSIS — I2510 Atherosclerotic heart disease of native coronary artery without angina pectoris: Secondary | ICD-10-CM | POA: Diagnosis not present

## 2019-01-04 DIAGNOSIS — S22088D Other fracture of T11-T12 vertebra, subsequent encounter for fracture with routine healing: Secondary | ICD-10-CM | POA: Diagnosis not present

## 2019-01-04 DIAGNOSIS — W19XXXD Unspecified fall, subsequent encounter: Secondary | ICD-10-CM | POA: Diagnosis not present

## 2019-01-04 DIAGNOSIS — I6529 Occlusion and stenosis of unspecified carotid artery: Secondary | ICD-10-CM | POA: Diagnosis not present

## 2019-01-04 DIAGNOSIS — Z87891 Personal history of nicotine dependence: Secondary | ICD-10-CM | POA: Diagnosis not present

## 2019-01-04 DIAGNOSIS — Z96642 Presence of left artificial hip joint: Secondary | ICD-10-CM | POA: Diagnosis not present

## 2019-01-04 DIAGNOSIS — M1990 Unspecified osteoarthritis, unspecified site: Secondary | ICD-10-CM | POA: Diagnosis not present

## 2019-01-04 DIAGNOSIS — R42 Dizziness and giddiness: Secondary | ICD-10-CM | POA: Diagnosis not present

## 2019-01-04 DIAGNOSIS — S32592D Other specified fracture of left pubis, subsequent encounter for fracture with routine healing: Secondary | ICD-10-CM | POA: Diagnosis not present

## 2019-01-04 DIAGNOSIS — E039 Hypothyroidism, unspecified: Secondary | ICD-10-CM | POA: Diagnosis not present

## 2019-01-04 NOTE — Telephone Encounter (Signed)
Noted.  Algis Greenhouse. Jerline Pain, MD 01/04/2019 8:00 AM

## 2019-01-05 DIAGNOSIS — I2510 Atherosclerotic heart disease of native coronary artery without angina pectoris: Secondary | ICD-10-CM | POA: Diagnosis not present

## 2019-01-05 DIAGNOSIS — M1990 Unspecified osteoarthritis, unspecified site: Secondary | ICD-10-CM | POA: Diagnosis not present

## 2019-01-05 DIAGNOSIS — Z9181 History of falling: Secondary | ICD-10-CM | POA: Diagnosis not present

## 2019-01-05 DIAGNOSIS — E039 Hypothyroidism, unspecified: Secondary | ICD-10-CM | POA: Diagnosis not present

## 2019-01-05 DIAGNOSIS — S22088D Other fracture of T11-T12 vertebra, subsequent encounter for fracture with routine healing: Secondary | ICD-10-CM | POA: Diagnosis not present

## 2019-01-05 DIAGNOSIS — R911 Solitary pulmonary nodule: Secondary | ICD-10-CM | POA: Diagnosis not present

## 2019-01-05 DIAGNOSIS — Z96642 Presence of left artificial hip joint: Secondary | ICD-10-CM | POA: Diagnosis not present

## 2019-01-05 DIAGNOSIS — I6529 Occlusion and stenosis of unspecified carotid artery: Secondary | ICD-10-CM | POA: Diagnosis not present

## 2019-01-05 DIAGNOSIS — E785 Hyperlipidemia, unspecified: Secondary | ICD-10-CM | POA: Diagnosis not present

## 2019-01-05 DIAGNOSIS — R42 Dizziness and giddiness: Secondary | ICD-10-CM | POA: Diagnosis not present

## 2019-01-05 DIAGNOSIS — E559 Vitamin D deficiency, unspecified: Secondary | ICD-10-CM | POA: Diagnosis not present

## 2019-01-05 DIAGNOSIS — Z87891 Personal history of nicotine dependence: Secondary | ICD-10-CM | POA: Diagnosis not present

## 2019-01-05 DIAGNOSIS — W19XXXD Unspecified fall, subsequent encounter: Secondary | ICD-10-CM | POA: Diagnosis not present

## 2019-01-05 DIAGNOSIS — S32592D Other specified fracture of left pubis, subsequent encounter for fracture with routine healing: Secondary | ICD-10-CM | POA: Diagnosis not present

## 2019-02-10 DIAGNOSIS — Z96642 Presence of left artificial hip joint: Secondary | ICD-10-CM | POA: Diagnosis not present

## 2019-02-10 DIAGNOSIS — Z471 Aftercare following joint replacement surgery: Secondary | ICD-10-CM | POA: Diagnosis not present

## 2019-02-17 ENCOUNTER — Telehealth: Payer: Self-pay | Admitting: Family Medicine

## 2019-02-17 NOTE — Telephone Encounter (Signed)
I left a message asking the patient to call and schedule Medicare AWV with Courtney ([facility]).  If patient calls back, please schedule Medicare Wellness Visit (initial) at next available opening.  VDM (Dee-Dee) 

## 2019-02-27 ENCOUNTER — Ambulatory Visit: Payer: Medicare Other

## 2019-02-28 DIAGNOSIS — Z96642 Presence of left artificial hip joint: Secondary | ICD-10-CM | POA: Diagnosis not present

## 2019-02-28 DIAGNOSIS — M79671 Pain in right foot: Secondary | ICD-10-CM | POA: Diagnosis not present

## 2019-02-28 DIAGNOSIS — M25551 Pain in right hip: Secondary | ICD-10-CM | POA: Diagnosis not present

## 2019-02-28 DIAGNOSIS — S92354A Nondisplaced fracture of fifth metatarsal bone, right foot, initial encounter for closed fracture: Secondary | ICD-10-CM | POA: Diagnosis not present

## 2019-03-09 ENCOUNTER — Telehealth: Payer: Self-pay | Admitting: Family Medicine

## 2019-03-09 NOTE — Telephone Encounter (Signed)
I left a message asking the patient to call and schedule Medicare AWV with Loma Sousa ([facility]) on 03/14/2019 after seeing Dr. Jerline Pain.  I'm waiting for a call back to either confirm or decline the appointment. If patient calls back, please update appointment notes.  VDM (Dee-Dee)

## 2019-03-14 ENCOUNTER — Ambulatory Visit: Payer: Medicare Other

## 2019-03-14 ENCOUNTER — Ambulatory Visit: Payer: Medicare Other | Admitting: Family Medicine

## 2019-03-17 ENCOUNTER — Ambulatory Visit: Payer: Medicare Other

## 2019-03-28 DIAGNOSIS — S92354A Nondisplaced fracture of fifth metatarsal bone, right foot, initial encounter for closed fracture: Secondary | ICD-10-CM | POA: Diagnosis not present

## 2019-03-31 ENCOUNTER — Other Ambulatory Visit: Payer: Self-pay | Admitting: Internal Medicine

## 2019-04-11 ENCOUNTER — Telehealth: Payer: Self-pay | Admitting: Family Medicine

## 2019-04-11 NOTE — Telephone Encounter (Signed)
Patient has appt on Thursday Morning.  She would like to know if she could move to this evening instead.  Could we change a virtual visit slot?

## 2019-04-11 NOTE — Telephone Encounter (Signed)
Please move to 10:40 slot on same day

## 2019-04-13 ENCOUNTER — Other Ambulatory Visit: Payer: Self-pay

## 2019-04-13 ENCOUNTER — Ambulatory Visit (INDEPENDENT_AMBULATORY_CARE_PROVIDER_SITE_OTHER): Payer: Medicare Other | Admitting: Family Medicine

## 2019-04-13 ENCOUNTER — Ambulatory Visit: Payer: Medicare Other | Admitting: Family Medicine

## 2019-04-13 ENCOUNTER — Encounter: Payer: Self-pay | Admitting: Family Medicine

## 2019-04-13 ENCOUNTER — Telehealth: Payer: Self-pay | Admitting: Psychology

## 2019-04-13 VITALS — BP 140/82 | HR 73 | Temp 97.7°F | Ht 67.0 in | Wt 173.4 lb

## 2019-04-13 DIAGNOSIS — E038 Other specified hypothyroidism: Secondary | ICD-10-CM

## 2019-04-13 DIAGNOSIS — R2681 Unsteadiness on feet: Secondary | ICD-10-CM | POA: Diagnosis not present

## 2019-04-13 DIAGNOSIS — E785 Hyperlipidemia, unspecified: Secondary | ICD-10-CM | POA: Diagnosis not present

## 2019-04-13 DIAGNOSIS — F3181 Bipolar II disorder: Secondary | ICD-10-CM

## 2019-04-13 DIAGNOSIS — R6889 Other general symptoms and signs: Secondary | ICD-10-CM | POA: Diagnosis not present

## 2019-04-13 LAB — LIPID PANEL
Cholesterol: 101 mg/dL (ref 0–200)
HDL: 39.8 mg/dL (ref >39.00)
LDL Cholesterol: 49 mg/dL (ref 0–99)
NonHDL: 61.13
Total CHOL/HDL Ratio: 3
Triglycerides: 60 mg/dL (ref 0.0–149.0)
VLDL: 12 mg/dL (ref 0.0–40.0)

## 2019-04-13 LAB — CBC
HCT: 37.5 % (ref 36.0–46.0)
Hemoglobin: 12.8 g/dL (ref 12.0–15.0)
MCHC: 34.2 g/dL (ref 30.0–36.0)
MCV: 97.4 fl (ref 78.0–100.0)
Platelets: 257 10*3/uL (ref 150.0–400.0)
RBC: 3.84 Mil/uL — ABNORMAL LOW (ref 3.87–5.11)
RDW: 12.8 % (ref 11.5–15.5)
WBC: 7.4 10*3/uL (ref 4.0–10.5)

## 2019-04-13 LAB — VITAMIN B12: Vitamin B-12: >1500 pg/mL — ABNORMAL HIGH (ref 211–911)

## 2019-04-13 LAB — COMPREHENSIVE METABOLIC PANEL
ALT: 27 U/L (ref 0–35)
AST: 28 U/L (ref 0–37)
Albumin: 4.5 g/dL (ref 3.5–5.2)
Alkaline Phosphatase: 64 U/L (ref 39–117)
BUN: 18 mg/dL (ref 6–23)
CO2: 29 mEq/L (ref 19–32)
Calcium: 10.8 mg/dL — ABNORMAL HIGH (ref 8.4–10.5)
Chloride: 101 mEq/L (ref 96–112)
Creatinine, Ser: 0.9 mg/dL (ref 0.40–1.20)
GFR: 60.34 mL/min (ref >60.00)
Glucose, Bld: 84 mg/dL (ref 70–99)
Potassium: 4.6 mEq/L (ref 3.5–5.1)
Sodium: 136 mEq/L (ref 135–145)
Total Bilirubin: 0.5 mg/dL (ref 0.2–1.2)
Total Protein: 6.7 g/dL (ref 6.0–8.3)

## 2019-04-13 LAB — TSH: TSH: 2.94 u[IU]/mL (ref 0.35–4.50)

## 2019-04-13 MED ORDER — BUPROPION HCL ER (XL) 150 MG PO TB24: 150.0000 mg | ORAL_TABLET | Freq: Every day | ORAL | 3 refills | Status: DC

## 2019-04-13 MED ORDER — FLUOXETINE HCL 20 MG PO TABS: 60.0000 mg | ORAL_TABLET | Freq: Every day | ORAL | 3 refills | Status: DC

## 2019-04-13 NOTE — Assessment & Plan Note (Signed)
Has previously had 2 out of 3 delayed word recall.  Will be seeing neurologist as noted above.

## 2019-04-13 NOTE — Telephone Encounter (Signed)
error 

## 2019-04-13 NOTE — Assessment & Plan Note (Addendum)
She has had normal neurologic exams however has had progressively worsening unsteady gait with several falls resulting in fractures.  Will check labs today including CBC, c-Met, TSH, and B12.  Also order for MRI given progressive nature and addition to her forgetfulness.  Will also place referral to neurology for further evaluation/management.  She is currently working with physical therapy.  Discussed potential role Klonopin may be playing however states that she only takes very sparingly and the falls that she has had or not after taking Klonopin.  Do not think that this is a cardiac issue however she does have history of bilateral carotid stenosis and aortic regurgitation.  Advised her to follow-up with her cardiologist ASAP as she will likely need repeat echocardiogram as it has been a couple of years since is has been done.

## 2019-04-13 NOTE — Progress Notes (Signed)
Shari Horn is a 79 y.o. female who presents today for an office visit.  Assessment/Plan:  Chronic Problems Addressed Today: Unsteady gait She has had normal neurologic exams however has had progressively worsening unsteady gait with several falls resulting in fractures.  Will check labs today including CBC, c-Met, TSH, and B12.  Also order for MRI given progressive nature and addition to her forgetfulness.  Will also place referral to neurology for further evaluation/management.  She is currently working with physical therapy.  Discussed potential role Klonopin may be playing however states that she only takes very sparingly and the falls that she has had or not after taking Klonopin.  Do not think that this is a cardiac issue however she does have history of bilateral carotid stenosis and aortic regurgitation.  Advised her to follow-up with her cardiologist ASAP as she will likely need repeat echocardiogram as it has been a couple of years since is has been done.  Forgetfulness Has previously had 2 out of 3 delayed word recall.  Will be seeing neurologist as noted above.  Dyslipidemia Check lipid panel blood draw.  Hypothyroidism Check TSH.  Continue Synthroid 75 mcg daily.  Bipolar 2 disorder (HCC) No mania symptoms however has had worsening depressive symptoms.  We will start Wellbutrin 150 mg daily.  Discussed potential side effects.  Continue Klonopin 0.5 mg three times daily as needed, Prozac 60 mg daily, Lamictal 100 mg daily, and Seroquel 300 mg nightly.  She will check in with me in a couple of weeks.     Subjective:  HPI:  Patient is here with multiple concerns.  Her primary concern is her recurrent and frequent falls.  She has unfortunately suffered several fractures due to her frequent falls over the last couple of years.  Most recently a couple weeks ago she fell through her sister's door and ended up breaking a bone in her foot.  She has been seen by orthopedics for this.   Several months ago she also fractured her pelvis during fall.  This is been an ongoing issue for the past couple of years but seems to be worsening.  She has also had a few episodes of falling without remembering how she fell.  This is happened a couple of times, neither which were recent.  She has been working with physical therapy which seems to be helping.  She is also concerned about cognitive decline.  She will have issues with word finding and issues with spelling.  She will sometimes wake up in the night and forget where she is.  She is also had worsening depressive symptoms.  Most related to her declining physical health.  She has a history of bipolar two disorder and is currently on Lamictal, Prozac, and Seroquel.  This regimen seem to work well for her in the past to control her mania symptoms however depressive symptoms have worsened recently as noted above.       Objective:  Physical Exam: BP 140/82 (BP Location: Right Arm, Patient Position: Sitting, Cuff Size: Normal)   Pulse 73   Temp 97.7 F (36.5 C) (Temporal)   Ht '5\' 7"'$  (1.702 m)   Wt 173 lb 6.4 oz (78.7 kg)   LMP  (LMP Unknown)   SpO2 98%   BMI 27.16 kg/m   Gen: No acute distress, resting comfortably HEENT: No carotid bruits.  TMs clear bilaterally. CV: Regular rate and rhythm.  2 out of 6 systolic murmur noted. Pulm: Normal work of breathing, clear  to auscultation bilaterally with no crackles, wheezes, or rhonchi Neuro: Cranial nerves II through XII intact.  Unsteady gait.  Moves all extremities spontaneously.  Strength out of five in upper and lower extremities.  No dysmetria. Psych: Normal affect and thought content  Time Spent: 46 minutes of total time was spent on the date of the encounter performing the following actions: chart review prior to seeing the patient, obtaining history, performing a medically necessary exam, counseling on the treatment plan, placing orders, and documenting in our EHR.        Algis Greenhouse. Jerline Pain, MD 04/13/2019 11:49 AM

## 2019-04-13 NOTE — Assessment & Plan Note (Signed)
Check TSH.  Continue Synthroid 75 mcg daily. 

## 2019-04-13 NOTE — Assessment & Plan Note (Signed)
No mania symptoms however has had worsening depressive symptoms.  We will start Wellbutrin 150 mg daily.  Discussed potential side effects.  Continue Klonopin 0.5 mg three times daily as needed, Prozac 60 mg daily, Lamictal 100 mg daily, and Seroquel 300 mg nightly.  She will check in with me in a couple of weeks.

## 2019-04-13 NOTE — Assessment & Plan Note (Signed)
Check lipid panel blood draw.

## 2019-04-13 NOTE — Patient Instructions (Signed)
It was very nice to see you today!  We will place a referral for you to see the neurologist to discuss your frequent falls.  Please schedule an appointment with your cardiologist as soon as you can to make sure that everything is okay with your heart.  If you have another fall please let me know.  We will check an MRI.  You will be called to get this scheduled.  We will check some blood work today as well.  Please start the Wellbutrin.  Please check in with me in a couple weeks to let me know how the medication is working for you.  Take care, Dr Jerline Pain  Please try these tips to maintain a healthy lifestyle:   Eat at least 3 REAL meals and 1-2 snacks per day.  Aim for no more than 5 hours between eating.  If you eat breakfast, please do so within one hour of getting up.    Each meal should contain half fruits/vegetables, one quarter protein, and one quarter carbs (no bigger than a computer mouse)    Cut down on sweet beverages. This includes juice, soda, and sweet tea.     Drink at least 1 glass of water with each meal and aim for at least 8 glasses per day   Exercise at least 150 minutes every week.

## 2019-04-14 NOTE — Progress Notes (Signed)
Please inform patient of the following:  Blood work all STABLE.  Do not need to make any changes to her treatment plan at this time. We will call her with the results of the MRI once we receive them.  Shari Horn. Jerline Pain, MD 04/14/2019 11:09 AM

## 2019-04-24 ENCOUNTER — Other Ambulatory Visit: Payer: Self-pay | Admitting: Internal Medicine

## 2019-04-25 ENCOUNTER — Telehealth: Payer: Self-pay | Admitting: Family Medicine

## 2019-04-25 ENCOUNTER — Other Ambulatory Visit: Payer: Self-pay

## 2019-04-25 NOTE — Telephone Encounter (Signed)
I sent a referral message yesterday requesting a new neuro referral for Shari Horn.  Pt is calling again about this. Previous referral was placed on 3/25 for [facility] - she is stating that she wants to stay with Dr Delice Lesch at [facility].

## 2019-04-26 ENCOUNTER — Other Ambulatory Visit: Payer: Self-pay

## 2019-04-26 DIAGNOSIS — R6889 Other general symptoms and signs: Secondary | ICD-10-CM

## 2019-04-26 NOTE — Telephone Encounter (Signed)
Referral placed.

## 2019-05-11 ENCOUNTER — Encounter: Payer: Self-pay | Admitting: Internal Medicine

## 2019-05-11 ENCOUNTER — Ambulatory Visit: Payer: Medicare Other | Admitting: Internal Medicine

## 2019-05-11 ENCOUNTER — Other Ambulatory Visit: Payer: Self-pay

## 2019-05-11 VITALS — BP 138/78 | HR 72 | Ht 67.0 in | Wt 176.0 lb

## 2019-05-11 DIAGNOSIS — E785 Hyperlipidemia, unspecified: Secondary | ICD-10-CM

## 2019-05-11 DIAGNOSIS — Z87898 Personal history of other specified conditions: Secondary | ICD-10-CM

## 2019-05-11 DIAGNOSIS — I6523 Occlusion and stenosis of bilateral carotid arteries: Secondary | ICD-10-CM

## 2019-05-11 NOTE — Patient Instructions (Signed)
Medication Instructions:  Your physician recommends that you continue on your current medications as directed. Please refer to the Current Medication list given to you today.  *If you need a refill on your cardiac medications before your next appointment, please call your pharmacy*   Lab Work: NONE If you have labs (blood work) drawn today and your tests are completely normal, you will receive your results only by: Marland Kitchen MyChart Message (if you have MyChart) OR . A paper copy in the mail If you have any lab test that is abnormal or we need to change your treatment, we will call you to review the results.   Testing/Procedures: Carotid Doppler due September 2021   Follow-Up: At [facility], you and your health needs are our priority.  As part of our continuing mission to provide you with exceptional heart care, we have created designated Provider Care Teams.  These Care Teams include your primary Cardiologist (physician) and Advanced Practice Providers (APPs -  Physician Assistants and Nurse Practitioners) who all work together to provide you with the care you need, when you need it.  We recommend signing up for the patient portal called "MyChart".  Sign up information is provided on this After Visit Summary.  MyChart is used to connect with patients for Virtual Visits (Telemedicine).  Patients are able to view lab/test results, encounter notes, upcoming appointments, etc.  Non-urgent messages can be sent to your provider as well.   To learn more about what you can do with MyChart, go to [email or web address].    Your next appointment:   12 month(s)  The format for your next appointment:   In Person  Provider:   You may see Pixie Casino, MD or one of the following Advanced Practice Providers on your designated Care Team:    Almyra Deforest, PA-C  Fabian Sharp, PA-C or   Roby Lofts, [place]    Other Instructions

## 2019-05-11 NOTE — Progress Notes (Signed)
OFFICE NOTE  Chief Complaint:  No complaints  Primary Care Physician: Vivi Barrack, MD  HPI:  Shari Horn is a 79 year old female who was referred to me for irregular heartbeat and PVCs, as well as an abnormal screening carotid ultrasound. We repeated that carotid Doppler which indicates increased velocity on the left internal carotid up to 206 cm/second in the posterior part of the internal carotid artery consistent with 50-69% left internal carotid artery stenosis. There was a mild amount of plaque in the right carotid, 0-49% stenosis, neither of which meets criteria for intervention at this time. In addition we performed an echocardiogram in the office, indicating an EF of 123456, mild diastolic dysfunction. There was trivial mitral regurgitation and aortic regurgitation, but no significant stenosis. I reviewed the results with her today and feel like she is doing quite well but would recommend a repeat lipid profile and more aggressive lipid management as necessary. I have copied you on that laboratory work so that you may review it, and we will plan to see her back annually with repeat carotid Dopplers at that time. She, in fact, was due to go over repeat dopplers today, but has not had them performed.  She was having palpitations, PVCs which are not as worrisome given the fact that she has normal LV function and no signs or symptoms of ischemia. I recommended decreasing her caffeine intake which may be difficult to do as she is reportedly self-addicted, but she does drink about 1 small pot, or 4 cups of coffee every day and that certainly can contribute significantly to her PVCs. She now reports the palpitations have improved.  Finally, she recently had cholesterol testing through her primary care provider. Her total cholesterol was 233, triglycerides 183, HDL 41 LDL 156. Based on this for right torn was increased from 10/20 mg to 10/40 mg. Her goal LDL, however is less than 70, which means  she needs an additional 50% reduction in her cholesterol. I'm not sure that this is achievable with an increase in her simvastatin by 20 mg.  I saw Shari Horn back in the office today. She recently was seen in emergency department for chest discomfort which was after grieving for friend who had committed suicide. She feels that this was all related to the grieving and cardiovascular workup was negative. In November she had carotid Dopplers which show stable carotid stenoses. Her particle numbers remain elevated with LDL-P of 1795, despite being on Crestor 20 and Zetia 10 mg. I increased her Crestor to 40 mg daily. She's had very high LDLs in the past and significant coronary stenoses. 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  Shari Horn returns today for follow-up. Overall she feels well. At her last cholesterol check about a year ago she had excellent control of her cholesterol with an LDL particle number just over thousand. She apparently had recheck by her primary care provider this fall. She also had repeat carotid Dopplers which shows stable moderate carotid stenoses. She denies any side effects from her cholesterol medicine. She tells me that she's planning a trip to the [place] and significant altitude and is interested in medication for that. I'm happy to provide her acetazolamide.  03/24/2018  Shari Horn is seen today in follow-up.  Over the past year she is done well.  She has had marked improvement in her lipid profile.  In fact now her most recent labs showed a total cholesterol of 91 and LDL  of 30.  I think there is been significant dietary changes it may have been responsible for this.  Because of her history of carotid artery disease, I am hesitant to back off on her medications.  In fact the lower LDL likely the better for her.  She is otherwise asymptomatic.  In the fall she did unfortunately have a hip fracture and had to have surgery for that.  But otherwise she is doing  well.  05/11/2019  Shari Horn is seen today in follow-up.  She had unfortunately experienced several falls recently including a hip fracture requiring hip replacement, femur injury as well as a foot break.  She was found to have some osteopenia if not possible osteoporosis.  She is only on calcium vitamin D but not on any type of bone stimulant.  Her cardiac standpoint denies any chest pain or worsening shortness of breath.  Her most recent carotid Doppler showed mild to moderate carotid disease.  Her lipids are well controlled including labs a week ago which showed total cholesterol 101, triglycerides 60, HDL 39 and a LDL of 49.  Blood pressure is also reasonably well controlled.  PMHx:  Past Medical History:  Diagnosis Date  . Carotid artery disease ([place])   . Depression   . Dyslipidemia   . Hyperlipidemia   . Hypothyroidism   . 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 PVC's (premature ventricular contractions)     Past Surgical History:  Procedure Laterality Date  . BREAST BIOPSY    . BREAST EXCISIONAL BIOPSY Left 1990  . BREAST EXCISIONAL BIOPSY Left 1996  . Carotid Doppler  12/2011   0-49% RICA stenosis, 0000000 LICA stenosis  . TOTAL HIP ARTHROPLASTY Left 12/12/2017   Procedure: TOTAL HIP ARTHROPLASTY ANTERIOR APPROACH;  Surgeon: Rod Can, MD;  Location: [facility];  Service: Orthopedics;  Laterality: Left;  . TRANSTHORACIC ECHOCARDIOGRAM  12/2011   EF 123456, grade 1 diastolic dysfunction; trivial AV regurg; calcified MV annulus    FAMHx:  Family History  Problem Relation Age of Onset  . Heart failure Mother   . Thyroid disease Mother   . Hypertension Mother   . Diabetes Mother   . Arthritis/Rheumatoid Father   . Heart Problems Maternal Grandmother   . Heart disease Brother        valve replacement  . Heart disease Child   . Colon cancer Neg Hx   . Esophageal cancer Neg Hx   . Stomach cancer Neg Hx     SOCHx:   reports that she quit smoking about 46 years ago. Her smoking  use included cigarettes. She quit after 10.00 years of use. She has never used smokeless tobacco. She reports that she does not drink alcohol or use drugs.  ALLERGIES:  Allergies  Allergen Reactions  . Codeine Nausea And Vomiting and Nausea Only    Other reaction(s): Unknown    ROS: A comprehensive review of systems was negative.  HOME MEDS: Current Outpatient Medications  Medication Sig Dispense Refill  . buPROPion (WELLBUTRIN XL) 150 MG 24 hr tablet Take 1 tablet (150 mg total) by mouth daily. 90 tablet 3  . cholecalciferol (VITAMIN D3) 25 MCG (1000 UT) tablet Take 2,000 Units by mouth daily.    . clonazePAM (KLONOPIN) 0.5 MG tablet Take 1 tablet (0.5 mg total) by mouth 3 (three) times daily as needed for anxiety. 15 tablet 0  . ezetimibe (ZETIA) 10 MG tablet Take 1 tablet (10 mg total) by mouth daily. 90 tablet  0  . FLUoxetine (PROZAC) 20 MG tablet Take 3 tablets (60 mg total) by mouth daily. 270 tablet 3  . lamoTRIgine (LAMICTAL) 100 MG tablet Take 100 mg by mouth daily.    Marland Kitchen levothyroxine (SYNTHROID) 75 MCG tablet TAKE 1 TABLET(75 MCG) BY MOUTH DAILY 90 tablet 1  . meclizine (ANTIVERT) 25 MG tablet Take 1 tablet (25 mg total) by mouth 3 (three) times daily as needed for dizziness. 30 tablet 0  . Multiple Vitamin (MULTIVITAMIN WITH MINERALS) TABS tablet Take 1 tablet by mouth daily.    . naproxen sodium (ALEVE) 220 MG tablet Take 440 mg by mouth 2 (two) times daily.    Marland Kitchen oxyCODONE-acetaminophen (PERCOCET/ROXICET) 5-325 MG tablet Take 1 tablet by mouth every 4 (four) hours as needed for moderate pain. 20 tablet 0  . pantoprazole (PROTONIX) 20 MG tablet Take 40 mg by mouth daily as needed for heartburn.     . pantoprazole (PROTONIX) 40 MG tablet TAKE 1 TABLET(40 MG) BY MOUTH DAILY 90 tablet 3  . QUEtiapine (SEROQUEL) 300 MG tablet Take 300 mg by mouth at bedtime.     . rosuvastatin (CRESTOR) 40 MG tablet Take 1 tablet (40 mg total) by mouth daily. Please make apt for future refills 1st  attempt thank you [phone]. 30 tablet 0  . senna (SENOKOT) 8.6 MG TABS tablet Take 5 tablets by mouth at bedtime.      No current facility-administered medications for this visit.    LABS/IMAGING: No results found for this or any previous visit (from the past 48 hour(s)). No results found.  VITALS: BP 138/78   Pulse 72   Ht 5\' 7"  (1.702 m)   Wt 176 lb (79.8 kg)   LMP  (LMP Unknown)   BMI 27.57 kg/m   EXAM: General appearance: alert and no distress Neck: no carotid bruit, no JVD and thyroid not enlarged, symmetric, no tenderness/mass/nodules Lungs: clear to auscultation bilaterally Heart: regular rate and rhythm, S1, S2 normal, no murmur, click, rub or gallop Abdomen: soft, non-tender; bowel sounds normal; no masses,  no organomegaly Extremities: extremities normal, atraumatic, no cyanosis or edema Pulses: 2+ and symmetric Skin: Skin color, texture, turgor normal. No rashes or lesions Neurologic: Grossly normal Psych: Pleasant  EKG: Normal sinus rhythm at 72-personally reviewed  ASSESSMENT: 1. Bilateral carotid artery disease - mild to moderate 2. Mixed hyperlipidemia- goal LDL less than 70 3. Palpitations-resolved 4. PVCs-resolved  PLAN: 1.   Shari Horn seems to be doing well with mild to moderate carotid artery disease.  This might have been slightly worse than the last study and she is due for repeat in September.  Her lipids are very well controlled LDL at 49.  She denies any palpitations.  There is no chest pain.  Unfortunately she has had several falls and had hip replacement.  She also has some osteopenia which she will need to be aggressive about.  Follow-up annually or sooner as necessary.  Pixie Casino, MD, [facility], [place] Director of the Advanced Lipid Disorders &  Cardiovascular Risk Reduction [facility] Diplomate of the American Board of Clinical Lipidology Attending Cardiologist  Direct Dial: [phone]  Fax:  [phone]  Website:  [email or web address]   Nadean Corwin Norleen Xie 05/11/2019, 2:18 PM

## 2019-05-13 ENCOUNTER — Ambulatory Visit
Admission: RE | Admit: 2019-05-13 | Discharge: 2019-05-13 | Disposition: A | Discharge status: Home / Self Care | Payer: Medicare Other | Source: Ambulatory Visit | Attending: Family Medicine | Admitting: Family Medicine

## 2019-05-13 ENCOUNTER — Other Ambulatory Visit: Payer: Medicare Other

## 2019-05-13 ENCOUNTER — Other Ambulatory Visit: Payer: Self-pay

## 2019-05-13 DIAGNOSIS — R296 Repeated falls: Secondary | ICD-10-CM | POA: Diagnosis not present

## 2019-05-13 DIAGNOSIS — R2681 Unsteadiness on feet: Secondary | ICD-10-CM

## 2019-05-13 DIAGNOSIS — R6889 Other general symptoms and signs: Secondary | ICD-10-CM

## 2019-05-15 NOTE — Progress Notes (Signed)
Please inform patient of the following:  MRI shows age related changes but no acute abnormality that would explain her symptoms. Recommend she follow up with neurology and cardiology as we discussed at her [place].  Algis Greenhouse. Jerline Pain, MD 05/15/2019 8:31 AM

## 2019-05-17 ENCOUNTER — Other Ambulatory Visit: Payer: Self-pay | Admitting: Family Medicine

## 2019-05-23 ENCOUNTER — Other Ambulatory Visit: Payer: Self-pay

## 2019-05-23 MED ORDER — EZETIMIBE 10 MG PO TABS: 10.0000 mg | ORAL_TABLET | Freq: Every day | ORAL | 3 refills | Status: DC

## 2019-05-29 DIAGNOSIS — S92354A Nondisplaced fracture of fifth metatarsal bone, right foot, initial encounter for closed fracture: Secondary | ICD-10-CM | POA: Diagnosis not present

## 2019-05-29 DIAGNOSIS — S32511A Fracture of superior rim of right pubis, initial encounter for closed fracture: Secondary | ICD-10-CM | POA: Diagnosis not present

## 2019-06-06 ENCOUNTER — Ambulatory Visit (INDEPENDENT_AMBULATORY_CARE_PROVIDER_SITE_OTHER): Payer: Medicare Other | Admitting: Family Medicine

## 2019-06-06 ENCOUNTER — Other Ambulatory Visit: Payer: Self-pay

## 2019-06-06 ENCOUNTER — Encounter: Payer: Self-pay | Admitting: Family Medicine

## 2019-06-06 VITALS — BP 140/90 | HR 74 | Temp 98.5°F | Ht 67.0 in | Wt 180.0 lb

## 2019-06-06 DIAGNOSIS — F3181 Bipolar II disorder: Secondary | ICD-10-CM

## 2019-06-06 DIAGNOSIS — R2681 Unsteadiness on feet: Secondary | ICD-10-CM | POA: Diagnosis not present

## 2019-06-06 NOTE — Assessment & Plan Note (Signed)
Patient feels down and depressed about her frequent falls and feels like this significantly interfering with her ability to perform activities of daily living and her overall quality of life.  She did not tolerate Wellbutrin.  She does not wish to start any other medications at this point.  We will continue Prozac 60 mg daily, Lamictal 100 mg daily, and Seroquel 300 mg nightly.

## 2019-06-06 NOTE — Progress Notes (Signed)
   Shari Horn is a 79 y.o. female who presents today for an office visit.  Assessment/Plan:  Chronic Problems Addressed Today: Unsteady gait Symptoms are overall stable though still unfortunately is having ongoing falls.  Recent work-up including CBC, C met, TSH, B12, and brain MRI were all unrevealing.  She will need to follow-up with neurology-we will place another referral today.  Bipolar 2 disorder [facility]) Patient feels down and depressed about her frequent falls and feels like this significantly interfering with her ability to perform activities of daily living and her overall quality of life.  She did not tolerate Wellbutrin.  She does not wish to start any other medications at this point.  We will continue Prozac 60 mg daily, Lamictal 100 mg daily, and Seroquel 300 mg nightly.     Subjective:  HPI:  Patient here for balance issues and unsteady gait.  She was last seen about 2 months ago.  Had labs at that time including CBC, C met, TSH, B12, and MRI.  All these labs were essentially stable with no inflammation for her symptoms.  She is additionally followed up with cardiologist for carotid artery stenosis and found that her carotid artery disease is also stable.  She was referred to neurology but has not yet followed up with them.  She fell about a week ago and hit her left cheek. Had some headache and nausea but seems to improving.  She request handicap placard be filled out today.  Apparently her orthopedic would not fill out the form for her.  She is interested in seeing a neurologist but did not want to wait 3 months to see them.       Objective:  Physical Exam: BP 140/90 (BP Location: Left Arm, Patient Position: Sitting, Cuff Size: Normal)   Pulse 74   Temp 98.5 F (36.9 C) (Temporal)   Ht '5\' 7"'$  (1.702 m)   Wt 180 lb (81.6 kg)   LMP  (LMP Unknown)   SpO2 95%   BMI 28.19 kg/m   Gen: No acute distress, resting comfortably Psych: Normal affect and thought content       Shari General M. Jerline Pain, MD 06/06/2019 2:40 PM

## 2019-06-06 NOTE — Patient Instructions (Signed)
It was very nice to see you today!  We will give you a application for handicap placard.  I will check on referral to neurology and see if we can get you to see somebody else aside from Dr. Delice Lesch.  Take care, Dr Jerline Pain  Please try these tips to maintain a healthy lifestyle:   Eat at least 3 REAL meals and 1-2 snacks per day.  Aim for no more than 5 hours between eating.  If you eat breakfast, please do so within one hour of getting up.    Each meal should contain half fruits/vegetables, one quarter protein, and one quarter carbs (no bigger than a computer mouse)   Cut down on sweet beverages. This includes juice, soda, and sweet tea.     Drink at least 1 glass of water with each meal and aim for at least 8 glasses per day   Exercise at least 150 minutes every week.

## 2019-06-06 NOTE — Assessment & Plan Note (Signed)
Symptoms are overall stable though still unfortunately is having ongoing falls.  Recent work-up including CBC, C met, TSH, B12, and brain MRI were all unrevealing.  She will need to follow-up with neurology-we will place another referral today.

## 2019-06-26 ENCOUNTER — Encounter: Payer: Self-pay | Admitting: Neurology

## 2019-07-18 ENCOUNTER — Ambulatory Visit (INDEPENDENT_AMBULATORY_CARE_PROVIDER_SITE_OTHER): Payer: Medicare Other | Admitting: Family Medicine

## 2019-07-18 ENCOUNTER — Other Ambulatory Visit: Payer: Self-pay

## 2019-07-18 VITALS — BP 122/76 | HR 71 | Temp 97.0°F | Ht 67.0 in | Wt 177.0 lb

## 2019-07-18 DIAGNOSIS — E038 Other specified hypothyroidism: Secondary | ICD-10-CM | POA: Diagnosis not present

## 2019-07-18 DIAGNOSIS — R2681 Unsteadiness on feet: Secondary | ICD-10-CM

## 2019-07-18 DIAGNOSIS — R6889 Other general symptoms and signs: Secondary | ICD-10-CM

## 2019-07-18 NOTE — Assessment & Plan Note (Signed)
Symptoms are overall stable.  She was taking Prevagen but is no longer taking.  She will discuss further with her neurologist in two months.

## 2019-07-18 NOTE — Assessment & Plan Note (Signed)
Doing well with Synthroid 75 mcg daily.  Last TSH at goal.

## 2019-07-18 NOTE — Patient Instructions (Signed)
It was very nice to see you today!  Your brain MRI showed age related shrinking.  This is called atrophy.  No obvious explanations for your symptoms on your MRI.  I would like to see you back after you see your neurologist.  We will see you back in 3 to 6 months.  Take care, Dr Jerline Pain  Please try these tips to maintain a healthy lifestyle:   Eat at least 3 REAL meals and 1-2 snacks per day.  Aim for no more than 5 hours between eating.  If you eat breakfast, please do so within one hour of getting up.    Each meal should contain half fruits/vegetables, one quarter protein, and one quarter carbs (no bigger than a computer mouse)   Cut down on sweet beverages. This includes juice, soda, and sweet tea.     Drink at least 1 glass of water with each meal and aim for at least 8 glasses per day   Exercise at least 150 minutes every week.

## 2019-07-18 NOTE — Assessment & Plan Note (Signed)
Had lengthy discussion with patient regarding recent MRI and ongoing symptoms.  Overall symptoms are stable but has had several falls over the last year resulting in major fractures.  She does not think another round of PT would be beneficial at this point.  She has an appointment with neurology in 2 months.

## 2019-07-18 NOTE — Progress Notes (Signed)
   Shari Horn is a 79 y.o. female who presents today for an office visit.  Assessment/Plan:  Chronic Problems Addressed Today: Unsteady gait Had lengthy discussion with patient regarding recent MRI and ongoing symptoms.  Overall symptoms are stable but has had several falls over the last year resulting in major fractures.  She does not think another round of PT would be beneficial at this point.  She has an appointment with neurology in 2 months.  Forgetfulness Symptoms are overall stable.  She was taking Prevagen but is no longer taking.  She will discuss further with her neurologist in two months.   Hypothyroidism Doing well with Synthroid 75 mcg daily.  Last TSH at goal.     Subjective:  HPI:  Patient her to discuss ongoing issues with balance issues and recent MRI.  She has not had any significant change in her symptoms since our last visit last month.       Objective:  Physical Exam: BP 122/76   Pulse 71   Temp (!) 97 F (36.1 C)   Ht 5\' 7"  (1.702 m)   Wt 177 lb (80.3 kg)   LMP  (LMP Unknown)   SpO2 99%   BMI 27.72 kg/m   Gen: No acute distress, resting comfortably Neuro: Grossly normal, moves all extremities Psych: Normal affect and thought content      Shari Horn M. Jerline Pain, MD 07/18/2019 9:57 AM

## 2019-07-31 ENCOUNTER — Other Ambulatory Visit: Payer: Medicare Other

## 2019-07-31 ENCOUNTER — Ambulatory Visit (INDEPENDENT_AMBULATORY_CARE_PROVIDER_SITE_OTHER): Payer: Medicare Other | Admitting: Neurology

## 2019-07-31 ENCOUNTER — Encounter: Payer: Self-pay | Admitting: Neurology

## 2019-07-31 ENCOUNTER — Other Ambulatory Visit: Payer: Self-pay

## 2019-07-31 ENCOUNTER — Other Ambulatory Visit: Payer: Self-pay | Admitting: Family Medicine

## 2019-07-31 VITALS — BP 134/82 | HR 76 | Resp 20 | Ht 67.0 in | Wt 177.0 lb

## 2019-07-31 DIAGNOSIS — R413 Other amnesia: Secondary | ICD-10-CM | POA: Diagnosis not present

## 2019-07-31 DIAGNOSIS — R296 Repeated falls: Secondary | ICD-10-CM | POA: Diagnosis not present

## 2019-07-31 DIAGNOSIS — G629 Polyneuropathy, unspecified: Secondary | ICD-10-CM

## 2019-07-31 LAB — FOLATE: Folate: >24.8 ng/mL (ref >5.9)

## 2019-07-31 LAB — CK: Total CK: 61 U/L (ref 7–177)

## 2019-07-31 LAB — SEDIMENTATION RATE: Sed Rate: 10 mm/hr (ref 0–30)

## 2019-07-31 NOTE — Progress Notes (Signed)
NEUROLOGY CONSULTATION NOTE  Shari Horn MRN: [id number] DOB: 1940-03-23  Referring provider: Dr. Dimas Chyle Primary care provider: Dr. Dimas Chyle  Reason for consult:  Memory loss, unsteady gait  Dear Dr Jerline Pain:  Thank you for your kind referral of Shari Horn for consultation of the above symptoms. Although her history is well known to you, please allow me to reiterate it for the purpose of our medical record. She is alone in the office today. Records and images were personally reviewed where available.   HISTORY OF PRESENT ILLNESS: This is a 79 year old right-handed woman with a history of hyperlipidemia, hypothyroidism, carotid artery disease, vertigo, bipolar disorder, seen in our office in 2015 and 2018 for vertigo and headaches. She presents today for new symptoms of memory loss and frequent falls.   1. Memory loss. Memory changes started becoming noticeable the past 2 years. She lives alone. She would forget what she came in a room to get. She loses her keys around the house. She could not remember the name of the bush outside her house. She denies getting lost driving, but feels like she is not as alert like she was before. She occasionally would not recall if she took her morning medications, she generally remembers to take her night doses. She tried using a pillbox but prefers to take it from memory, she did not have any difficulties using it, she just did not want to do it. A couple of months ago she forgot to pay her phone bill and her phone was cut off. She has not left the stove on. She is an Investment banker, corporate. She reads a book a week and likes to write letters, she has not noticed any issues doing these. Her older sister is forgetful. No history of significant head injuries with loss of consciousness, no alcohol use.   2. She has had 3 significant falls since 2019. The first fall occurred in 11/2017 while she was in a dark movie theater, she was walking up the stairs and  misjudged, grabbing the handrail but missing it and falling to her left, sustaining a left hip fracture requiring hip surgery. The next fall occurred in 09/2018 when she woke up at night to use the bathroom, as she turned she tried to grab the bed post but could not reach it and fell. She was found to have a nondisplaced left inferior pubic ramus fracture and underwent PT. The last fall was in 02/2019 as she was leaving her sister's house, she was going down 2 steps and fell, unable to get up. Her son had to be called to get her up and she was found to have a right foot fracture. She denies any paresthesias in her feet. She has occasional tingling in the fingers of her left hand. She has had left-sided neck pain. She had back surgery 14-15 years ago which took care of back pain, but she has an "occasional ping" in her back. No bowel/bladder dysfunction. Lumbar xray in 09/2018 showed advanced degenerative disease with levoscoliosis and L4-5 anterolisthesis.   She denies any headaches, dysarthria/dysphagia. She has occasional vertigo that resolves without intervention. The past 2 months she has felt like she has brief horizontal diplopia. She sleeps well with Seroquel, she has 300mg  tablets but cuts them and takes 2/3 tablet due to family concern that falls were medication-related. She takes clonazepam as needed, usually only twice a week. She self-discontinued Lamictal 6 months ago. She feels her mood is  generally pretty good.   I personally reviewed MRI brain without contrast done 04/2019 which did not show any acute changes, there was diffuse volume loss since 2018, no disproportionate areas of brain atrophy seen. There was mild chronic microvascular disease.  She had carotid dopplers showing 50-69% left internal carotid artery stenosis. There was a mild amount of plaque in the right carotid, 0-49% stenosis. Echo showed an EF of 24-23%, mild diastolic dysfunction. There was trivial mitral regurgitation and aortic  regurgitation, but no significant stenosis.   Diagnostic Data: Lab Results  Component Value Date   TSH 2.94 04/13/2019   Lab Results  Component Value Date   VITAMINB12 >1500 (H) 04/13/2019    PAST MEDICAL HISTORY: Past Medical History:  Diagnosis Date  . Carotid artery disease ([place])   . Depression   . Dyslipidemia   . Hyperlipidemia   . Hypothyroidism   . 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 PVC's (premature ventricular contractions)     PAST SURGICAL HISTORY: Past Surgical History:  Procedure Laterality Date  . BREAST BIOPSY    . BREAST EXCISIONAL BIOPSY Left 1990  . BREAST EXCISIONAL BIOPSY Left 1996  . Carotid Doppler  12/2011   0-49% RICA stenosis, 53-61% LICA stenosis  . TOTAL HIP ARTHROPLASTY Left 12/12/2017   Procedure: TOTAL HIP ARTHROPLASTY ANTERIOR APPROACH;  Surgeon: Rod Can, MD;  Location: [facility];  Service: Orthopedics;  Laterality: Left;  . TRANSTHORACIC ECHOCARDIOGRAM  12/2011   EF 44-31%, grade 1 diastolic dysfunction; trivial AV regurg; calcified MV annulus    MEDICATIONS: Current Outpatient Medications on File Prior to Visit  Medication Sig Dispense Refill  . cholecalciferol (VITAMIN D3) 25 MCG (1000 UT) tablet Take 2,000 Units by mouth daily.    . clonazePAM (KLONOPIN) 0.5 MG tablet Take 1 tablet (0.5 mg total) by mouth 3 (three) times daily as needed for anxiety. 15 tablet 0  . ezetimibe (ZETIA) 10 MG tablet Take 1 tablet (10 mg total) by mouth daily. 90 tablet 3  . FLUoxetine (PROZAC) 20 MG tablet Take 3 tablets (60 mg total) by mouth daily. 270 tablet 3  . lamoTRIgine (LAMICTAL) 100 MG tablet Take 100 mg by mouth daily.    Marland Kitchen levothyroxine (SYNTHROID) 75 MCG tablet TAKE 1 TABLET(75 MCG) BY MOUTH DAILY 90 tablet 1  . meclizine (ANTIVERT) 25 MG tablet Take 1 tablet (25 mg total) by mouth 3 (three) times daily as needed for dizziness. 30 tablet 0  . Multiple Vitamin (MULTIVITAMIN WITH MINERALS) TABS tablet Take 1 tablet by mouth daily.    . naproxen  sodium (ALEVE) 220 MG tablet Take 440 mg by mouth 2 (two) times daily.    . pantoprazole (PROTONIX) 40 MG tablet TAKE 1 TABLET(40 MG) BY MOUTH DAILY 90 tablet 3  . QUEtiapine (SEROQUEL) 300 MG tablet Take 300 mg by mouth at bedtime.     . rosuvastatin (CRESTOR) 40 MG tablet Take 1 tablet (40 mg total) by mouth daily. Please make apt for future refills 1st attempt thank you [phone]. 30 tablet 0  . senna (SENOKOT) 8.6 MG TABS tablet Take 5 tablets by mouth at bedtime.     . Fluoxetine HCl, PMDD, 20 MG TABS fluoxetine 20 mg tablet   60 mg by oral route.    . pantoprazole (PROTONIX) 20 MG tablet Take 40 mg by mouth daily as needed for heartburn.      No current facility-administered medications on file prior to visit.    ALLERGIES: Allergies  Allergen Reactions  . Codeine Nausea And Vomiting and Nausea Only    Other reaction(s): Unknown    FAMILY HISTORY: Family History  Problem Relation Age of Onset  . Heart failure Mother   . Thyroid disease Mother   . Hypertension Mother   . Diabetes Mother   . Arthritis/Rheumatoid Father   . Heart Problems Maternal Grandmother   . Heart disease Brother        valve replacement  . Heart disease Child   . Colon cancer Neg Hx   . Esophageal cancer Neg Hx   . Stomach cancer Neg Hx     SOCIAL HISTORY: Social History   Socioeconomic History  . Marital status: Divorced    Spouse name: Not on file  . Number of children: 2  . Years of education: master's  . Highest education level: Not on file  Occupational History  . Not on file  Tobacco Use  . Smoking status: Former Smoker    Years: 10.00    Types: Cigarettes    Quit date: 12/19/1972    Years since quitting: 46.6  . Smokeless tobacco: Never Used  Vaping Use  . Vaping Use: Never used  Substance and Sexual Activity  . Alcohol use: No    Comment: quit in 1973  . Drug use: No  . Sexual activity: Not on file  Other Topics Concern  . Not on file  Social History Narrative   Right  handed   Two story home   Drinks caffeine   Social Determinants of Health   Financial Resource Strain:   . Difficulty of Paying Living Expenses:   Food Insecurity:   . Worried About Charity fundraiser in the Last Year:   . Arboriculturist in the Last Year:   Transportation Needs:   . Film/video editor (Medical):   Marland Kitchen Lack of Transportation (Non-Medical):   Physical Activity:   . Days of Exercise per Week:   . Minutes of Exercise per Session:   Stress:   . Feeling of Stress :   Social Connections:   . Frequency of Communication with Friends and Family:   . Frequency of Social Gatherings with Friends and Family:   . Attends Religious Services:   . Active Member of Clubs or Organizations:   . Attends Archivist Meetings:   Marland Kitchen Marital Status:   Intimate Partner Violence:   . Fear of Current or Ex-Partner:   . Emotionally Abused:   Marland Kitchen Physically Abused:   . Sexually Abused:     PHYSICAL EXAM: Vitals:   07/31/19 0913  BP: 134/82  Pulse: 76  Resp: 20  SpO2: 97%   General: No acute distress Head:  Normocephalic/atraumatic Skin/Extremities: No rash, no edema Neurological Exam: Mental status: alert and oriented to person, place, and time, no dysarthria or aphasia, Fund of knowledge is appropriate.  Recent and remote memory are impaired.  Attention and concentration are normal.  SLUMS 20/30 [employer] Mental Exam 07/31/2019  Weekday Correct 1  Current year 1  What state are we in? 1  Amount spent 0  Amount left 0  # of Animals 2  5 objects recall 2  Number series 1  Hour markers 2  Time correct 2  Placed X in triangle correctly 1  Largest Figure 1  Name of female 2  Date back to work 2  Type of work 0  State she lived in 2  Total score 20   Cranial  nerves: CN I: not tested CN II: pupils equal, round and reactive to light, visual fields intact CN III, IV, VI:  full range of motion, no nystagmus, no ptosis CN V: facial sensation intact CN  VII: upper and lower face symmetric CN VIII: hearing intact to conversation CN IX, X: gag intact, uvula midline CN XI: sternocleidomastoid and trapezius muscles intact CN XII: tongue midline Bulk & Tone: normal, no fasciculations, no cogwheeling Motor: 5/5 throughout with no pronator drift. Sensation: intact to light touch, cold, pin on both UE, intact pin on both LE. Decreased vibration and temperature sense to ankles bilaterally. Romberg test slight sway Deep Tendon Reflexes: unable to elicit throughout, no ankle clonus Plantar responses: downgoing bilaterally Cerebellar: no incoordination on finger to nose testing Gait: narrow-based and steady, difficulty with tandem walk Tremor: none   IMPRESSION: This is a 79 year old right-handed woman with a history of hyperlipidemia, hypothyroidism, carotid artery disease, vertigo, bipolar disorder, presenting for evaluation of memory loss and frequent falls. 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 We discussed different causes of memory loss, her MRI brain was unremarkable with mild diffuse volume loss. Neurocognitive testing will be ordered to further evaluate cognitive concerns. Overall she is managing complex tasks fine, she was advised to start having a better system for medications. We discussed the falls, they appear mechanical in nature, however she does have neuropathy, which can affect balance. Neuropathy labs and EMG/NCV of both legs will be ordered to further evaluate her symptoms. She was advised to discuss the medication self-changes (stopped Lamictal, cutting Seroquel to 2/3) she has done with her prescribing physicians. Follow-up in 6 months, she knows to call for any changes.   Thank you for allowing me to participate in the care of this patient. Please do not hesitate to call for any questions or concerns.   Ellouise Newer, M.D.  CC: Dr. Jerline Pain

## 2019-07-31 NOTE — Patient Instructions (Signed)
1. Bloodwork for B1, ESR, CRP, copper, SPEP, IFE, folate, ANA, SS-A, SS-B  2. Schedule Neurocognitive testing  3. Schedule EMG/NCV of both legs  4. Recommend starting to use a medication checklist or pillbox to help with medications   5. Continue with using cane as needed for balance

## 2019-08-04 LAB — IMMUNOFIXATION ELECTROPHORESIS
IgG (Immunoglobin G), Serum: 699 mg/dL (ref 600–1540)
IgM, Serum: 146 mg/dL (ref 50–300)
Immunofix Electr Int: NOT DETECTED
Immunoglobulin A: 160 mg/dL (ref 70–320)

## 2019-08-04 LAB — VITAMIN B1: Vitamin B1 (Thiamine): 15 nmol/L (ref 8–30)

## 2019-08-04 LAB — ANTI-NUCLEAR AB-TITER (ANA TITER)
ANA TITER: 1:80 {titer} — ABNORMAL HIGH
ANA Titer 1: 1:80 {titer} — ABNORMAL HIGH

## 2019-08-04 LAB — ANA: Anti Nuclear Antibody (ANA): POSITIVE — AB

## 2019-08-04 LAB — SJOGRENS SYNDROME-B EXTRACTABLE NUCLEAR ANTIBODY: SSB (La) (ENA) Antibody, IgG: <1 AI

## 2019-08-04 LAB — SJOGRENS SYNDROME-A EXTRACTABLE NUCLEAR ANTIBODY: SSA (Ro) (ENA) Antibody, IgG: <1 AI

## 2019-08-07 ENCOUNTER — Telehealth: Payer: Self-pay

## 2019-08-07 NOTE — Telephone Encounter (Signed)
-----   Message from Cameron Sprang, MD sent at 08/06/2019  4:20 PM EDT ----- Pls let her know the bloodwork did not show any significant abnormalities. Thanks

## 2019-08-07 NOTE — Telephone Encounter (Signed)
Pt called and informed that bloodwork did not show any significant abnormalities

## 2019-08-08 ENCOUNTER — Other Ambulatory Visit: Payer: Self-pay | Admitting: Family Medicine

## 2019-08-11 ENCOUNTER — Other Ambulatory Visit: Payer: Self-pay | Admitting: Family Medicine

## 2019-08-11 DIAGNOSIS — F3181 Bipolar II disorder: Secondary | ICD-10-CM

## 2019-08-11 MED ORDER — QUETIAPINE FUMARATE 300 MG PO TABS: 300.0000 mg | ORAL_TABLET | Freq: Every day | ORAL | 3 refills | Status: DC

## 2019-08-11 NOTE — Telephone Encounter (Signed)
Seroquel prescribed by a previous provider in (?2018). Ok to fill?  LAST APPOINTMENT DATE: 07/18/2019   NEXT APPOINTMENT DATE: 10/24/2019    LAST REFILL: 09/10/2016  QTY: unknown

## 2019-08-11 NOTE — Telephone Encounter (Signed)
MEDICATION: QUEtiapine (SEROQUEL) 300 MG tablet  PHARMACY:  [facility] [place], [place] [address] Phone:  [phone]  Fax:  [phone]       Comments: Patient is completely out   **Let patient know to contact pharmacy at the end of the day to make sure medication is ready. **  ** Please notify patient to allow 48-72 hours to process**  **Encourage patient to contact the pharmacy for refills or they can request refills through [facility]**

## 2019-09-18 ENCOUNTER — Encounter: Payer: Self-pay | Admitting: Counselor

## 2019-09-18 ENCOUNTER — Ambulatory Visit: Payer: Medicare Other | Admitting: Psychology

## 2019-09-18 ENCOUNTER — Other Ambulatory Visit: Payer: Self-pay

## 2019-09-18 ENCOUNTER — Ambulatory Visit (INDEPENDENT_AMBULATORY_CARE_PROVIDER_SITE_OTHER): Payer: Medicare Other | Admitting: Counselor

## 2019-09-18 DIAGNOSIS — F3181 Bipolar II disorder: Secondary | ICD-10-CM

## 2019-09-18 DIAGNOSIS — F09 Unspecified mental disorder due to known physiological condition: Secondary | ICD-10-CM | POA: Diagnosis not present

## 2019-09-18 NOTE — Progress Notes (Signed)
   Psychometrist Note   Cognitive testing was administered to Shari Horn by Milana Kidney, B.S. (Technician) under the supervision of Alphonzo Severance, Psy.D., ABN.  Due to fatigue, patient was unable to complete the neuropsychological test battery.    The battery of tests administered was selected by Dr. Nicole Kindred with consideration to the patient's current level of functioning, the nature of her symptoms, emotional and behavioral responses during the interview, level of literacy, observed level of motivation/effort, and the nature of the referral question. This battery was communicated to the psychometrist. Communication between Dr. Nicole Kindred and the psychometrist was ongoing throughout the evaluation and Dr. Nicole Kindred was immediately accessible at all times. Dr. Nicole Kindred provided supervision to the technician on the date of this service, to the extent necessary to assure the quality of all services provided.    After a brief period of testing, the patient complained of fatigue. With encouragement, she persisted for a brief period of time, but then continued to complain of fatigue. Testing was then discontinued at her time. She will return on Wednesday, September 27, 2019 to complete testing. The patient understands she can contact our office should she require our assistance before this time.   A total of 40 minutes of billable time were spent with Shari Horn by the technician, including test administration and scoring time. Billing for these services is reflected in Dr. Les Pou note.   This note reflects time spent with the psychometrician and does not include test scores, clinical history, or any interpretations made by Dr. Nicole Kindred. The full report will follow in a separate note.

## 2019-09-18 NOTE — Progress Notes (Signed)
[facility]  Patient Name: Shari Horn MRN: [id number] Date of Birth: 12-23-1940 Age: 79 y.o. Education: 18 years  Referral Circumstances and Background Information  Shari Horn is a 79 y.o., right-hand dominant, divorced woman with a history of hyperlipidemia, hypothyroidism, carotid artery disease, bipolar II disorder, and more recently concerns about her memory and thinking abilities. She saw Dr. Delice Lesch in 2015 and 2018 for vertigo and headaches. At a recent visit on 08/09/2019, she was complaining of minor difficulties with memory and thinking. From review of Dr. Amparo Bristol detailed note, it looks like she is misplacing things, forgot to pay one bill, and is also having some problems with word finding. She has also had some falls although she is not falling frequently (3 since 2019).   On interview, the patient reported that she has noticed cognitive changes, although she thinks that it may just be due to old age. She specifically mentioned problems with word finding (she couldn't remember the word roller coaster for 1.5 days) and she also frequently forgets the reason for which she entered a room. On review of specific symptoms, she acknowledged frequently misplacing things but she has no problem losing track of the month or the year, and she has no difficulties with judgment and problem solving. She denied that she is forgetting things that people tell her or repeating herself. She is also concerned about falls, and again told me about her three falls over the past year, which are detailed extensively in Dr. Amparo Bristol note. With respect to movement changes, she denied any shuffling, although she feels as though she is much slower than in the past. She doesn't have any tremors. With respect to mood, the patient reported "I'm bipolar and I take a lot of drugs for that." She didn't clearly describe what her mood episodes are but it doesn't sound like she is  persistently depressed. She does get down about aging, it sounds like. She attempted to discontinue her Seroquel several weeks ago and felt like that destabilized her so she got back on it. With respect to sleep, she stated that she sleeps fairly well, about 8 hours most nights. She stated that her energy is up and down with her mood. Her appetite is good, she has to be "careful not to overeat."   From a functional perspective, the patient is still doing fairly well, and there is nothing that she cannot do now that she used to do previously. She paints, reads, writes and is fairly assiduous with those pursuits. She will typically paint for several hours 4 or 5 days a week. She has postcards and note cards made from her work. She does notice that she loses her place on a line when reading, occasionally. The patient is still managing her finances and denied difficulties; she did forget to pay her phone bill once and got her service turned off, although that has not been repeated. She doesn't get lost when she is driving and doesn't have problems navigating. She doesn't cook much and gets help with laundry and things around the house. She doesn't appreciate any problems with decision making, problem solving, or judgment.   Past Medical History and Review of Relevant Studies   Patient Active Problem List   Diagnosis Date Noted  . Pelvic fracture ([place]) 10/20/2018  . Forgetfulness 03/10/2017  . Unsteady gait 01/25/2017  . Gastroesophageal reflux disease without esophagitis 10/12/2016  . Osteopenia 09/17/2016  . Dysphagia 09/14/2016  . Bipolar 2  disorder ([place]) 09/14/2016  . Hypothyroidism 09/14/2016  . Bilateral carotid artery disease ([place]) 12/20/2012  . PVC's (premature ventricular contractions) 12/20/2012  . Dyslipidemia 12/20/2012    Review of Neuroimaging and Relevant Medical History: I reviewed the patient's MRI of the brain from 05/13/2019, which shows fairly significant volume loss although I  concur with radiology that it is generalized in distribution. It also does not appear to have progressed very much in the interval from her last MRI in 2018. 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 Luekoaraiosis vs. 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   Current Outpatient Medications  Medication Sig Dispense Refill  . cholecalciferol (VITAMIN D3) 25 MCG (1000 UT) tablet Take 2,000 Units by mouth daily.    . clonazePAM (KLONOPIN) 0.5 MG tablet Take 1 tablet (0.5 mg total) by mouth 3 (three) times daily as needed for anxiety. 15 tablet 0  . ezetimibe (ZETIA) 10 MG tablet Take 1 tablet (10 mg total) by mouth daily. 90 tablet 3  . FLUoxetine (PROZAC) 20 MG tablet Take 3 tablets (60 mg total) by mouth daily. 270 tablet 3  . Fluoxetine HCl, PMDD, 20 MG TABS fluoxetine 20 mg tablet   60 mg by oral route.    . lamoTRIgine (LAMICTAL) 100 MG tablet Take 100 mg by mouth daily. (Patient not taking: Reported on 07/31/2019)    . levothyroxine (SYNTHROID) 75 MCG tablet TAKE 1 TABLET(75 MCG) BY MOUTH DAILY 90 tablet 1  . meclizine (ANTIVERT) 25 MG tablet Take 1 tablet (25 mg total) by mouth 3 (three) times daily as needed for dizziness. 30 tablet 0  . Multiple Vitamin (MULTIVITAMIN WITH MINERALS) TABS tablet Take 1 tablet by mouth daily.    . naproxen sodium (ALEVE) 220 MG tablet Take 440 mg by mouth 2 (two) times daily.    . pantoprazole (PROTONIX) 20 MG tablet Take 40 mg by mouth daily as needed for heartburn.     . pantoprazole (PROTONIX) 40 MG tablet TAKE 1 TABLET(40 MG) BY MOUTH DAILY 90 tablet 3  . QUEtiapine (SEROQUEL) 300 MG tablet Take 1 tablet (300 mg total) by mouth at bedtime. 90 tablet 3  . rosuvastatin (CRESTOR) 40 MG tablet Take 1 tablet (40 mg total) by mouth daily. Please make apt for future refills 1st attempt thank you [phone]. 30 tablet 0  . senna  (SENOKOT) 8.6 MG TABS tablet Take 5 tablets by mouth at bedtime.      No current facility-administered medications for this visit.   Family History  Problem Relation Age of Onset  . Heart failure Mother   . Thyroid disease Mother   . Hypertension Mother   . Diabetes Mother   . Arthritis/Rheumatoid Father   . Heart Problems Maternal Grandmother   . Heart disease Brother        valve replacement  . Heart disease Child   . Colon cancer Neg Hx   . Esophageal cancer Neg Hx   . Stomach cancer Neg Hx    There is a questionable family history of dementia. She recalls her mother later in life saying that she couldn't do her checkbook anymore. There is a family history of psychiatric illness, she thinks her mother was bipolar. There is also a family history of alcoholism. She had a younger sister who committed suicide.   Psychosocial History  Developmental, Educational and Employment History: The patient is a native  of the [place] area. The patient did well in school and denied ever being held back or having any learning problems. She went on to do a BA at [employer] in [place] and [place]. She went on to earn a Masters in [employer] from [place] [employer]. 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   Psychiatric History: The patient reported that she has bipolar disorder. She has had extensive involvement in psychotherapy, and she reported two previous psychiatric admissions, around 65 and 79 years of age.   Substance Use History: The patient is a former smoker, she hasn't smoked in many years. She doesn't use alcohol or drugs.   Relationship History and Living Cimcumstances: The patient was married previously for 30 years, she and her husband divorced when she was about 25. She has two children, a son and a daughter. They have a good relationship and are actively involved in  her life.   Mental Status and Behavioral Observations  Sensorium/Arousal: The patient's level of arousal was awake and alert. Hearing and vision were marginally adequate for testing purposes. She did ask for repetition frequently and uses hearing aids but did not bring them.  Orientation: The patient was fully oriented to person, place, time, and situation.  Appearance: Dressed in appropriate, casual clothing with reasonable grooming and hygiene.  Behavior: The patient was appropriate.  Speech/language: Normal in rate, rhythm, volume, and prosody Gait/Posture: Not formally examined Movement: No Parkinsonism on exam with Dr. Delice Lesch Social Comportment: The patient was somewhat slow to warm up but was appropriate and pleasant Mood: Not clearly described, persistent depression denied, she stated that she feels as though she has been "stable" lately Affect: Mainly neutral with a slight undercurrent of irritability Thought process/content: The patient was somewhat expansive in her history giving but she was otherwise linear and goal-oriented in her thought process. Able to provide a detailed personal history and timeline.  Safety: No safety concerns were identified at today's visit Insight: [place] Cognitive Assessment  09/18/2019  Visuospatial/ Executive (0/5) 4  Naming (0/3) 3  Attention: Read list of digits (0/2) 1  Attention: Read list of letters (0/1) 1  Attention: Serial 7 subtraction starting at 100 (0/3) 3  Language: Repeat phrase (0/2) 1  Language : Fluency (0/1) 1  Abstraction (0/2) 2  Delayed Recall (0/5) 3  Orientation (0/6) 6  Total 25  Adjusted Score (based on education) 25   Test Procedures  Wide Range Achievement Test - 4             Word Reading Doy Mince' Intellectual Screening Test  Plan  Shari Horn was seen for a psychiatric diagnostic evaluation and neuropsychological testing. We completed a psychiatric diagnostic interview and some neuropsychological  testing. The patient became fatigued during testing with Ms. Chamberlain (Psychometrician) and eventually testing was discontinued. She did wish to reschedule and will return to clinic as reflected in Ms. Chamberlain's note. I called her after to process her experience of the testing and make sure she was comfortable rescheduling, but was unable to reach her. I Ieft a message instructing her to call back if desired. Ms. Barbra Sarks reported that she did not appear upset but instead complained of fatigue. Full and complete note with impressions, recommendations, and interpretation of test data to follow.   Viviano Simas Nicole Kindred, PsyD, [place] Clinical Neuropsychologist  Informed Consent and Coding/Compliance  Risks and benefits  of the evaluation were discussed with the patient prior to all testing procedures. I conducted a clinical interview and neuropsychological testing (at least two tests) with [name] and Milana Kidney, B.S. (Technician) administered additional test procedures. The patient was able to tolerate the testing procedures and the patient (and/or family if applicable) is likely to benefit from further follow up to receive the diagnosis and treatment recommendations. Billing below reflects technician time, my direct face-to-face time with the patient, time spent in test administration, and time spent in professional activities including but not limited to: neuropsychological test interpretation, integration of neuropsychological test data with clinical history, report preparation, treatment planning, care coordination, and review of diagnostically pertinent medical history or studies.   Services associated with this encounter: Clinical Interview [phone]) plus 19 minutes (84037; Test Administration by Professional) 30 minutes (54360; Neuropsychological Testing by Technician) 10 minutes (67703; Neuropsychological Testing by Technician, Adl.)

## 2019-09-19 ENCOUNTER — Other Ambulatory Visit: Payer: Self-pay

## 2019-09-19 ENCOUNTER — Ambulatory Visit (INDEPENDENT_AMBULATORY_CARE_PROVIDER_SITE_OTHER): Payer: Medicare Other | Admitting: Neurology

## 2019-09-19 DIAGNOSIS — G629 Polyneuropathy, unspecified: Secondary | ICD-10-CM

## 2019-09-19 DIAGNOSIS — R296 Repeated falls: Secondary | ICD-10-CM

## 2019-09-19 DIAGNOSIS — R413 Other amnesia: Secondary | ICD-10-CM

## 2019-09-19 DIAGNOSIS — M5417 Radiculopathy, lumbosacral region: Secondary | ICD-10-CM

## 2019-09-19 NOTE — Procedures (Signed)
[facility]  [address], [address]  [place], [place] [address] Tel: [phone] Fax:  [phone] Test Date:  09/19/2019  Patient: Shari Horn DOB: 10-18-1940 Physician: Narda Amber, DO  Sex: Female Height: 5\' 7"  Ref Phys: Ellouise Newer, M.D.  ID#: [id number] Temp: 34.0C Technician:    Patient Complaints: This is a 79 year old female referred for evaluation of gait instability and falls.  NCV & EMG Findings: Extensive electrodiagnostic testing of the right lower extremity and additional studies of the left shows:  1. Bilateral sural and superficial peroneal sensory responses are within normal limits. 2. Bilateral peroneal motor responses are absent at the extensor digitorum brevis and normal at the tibialis anterior.  Bilateral tibial motor responses are within normal limits.   3. Bilateral tibial H reflex studies are within normal limits. 4. Chronic motor axonal loss changes are seen affecting the L3-4 myotomes bilaterally, without accompanied active denervation.  Impression: 1. 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 2. There is no evidence of a large fiber sensorimotor polyneuropathy.   ___________________________ Narda Amber, DO    Nerve Conduction Studies Anti Sensory Summary Table   Stim Site NR Peak (ms) Norm Peak (ms) P-T Amp (V) Norm P-T Amp  Left Sup Peroneal Anti Sensory (Ant Lat Mall)  34C  12 cm    2.2 <4.6 6.5 >3  Right Sup Peroneal Anti Sensory (Ant Lat Mall)  34C  12 cm    2.0 <4.6 4.7 >3  Left Sural Anti Sensory (Lat Mall)  34C  Calf    3.5 <4.6 7.0 >3  Right Sural Anti Sensory (Lat Mall)  34C  Calf    3.6 <4.6 8.6 >3   Motor Summary Table   Stim Site NR Onset (ms) Norm Onset (ms) O-P Amp (mV) Norm O-P Amp Site1 Site2 Delta-0 (ms) Dist (cm) Vel (m/s) Norm Vel (m/s)  Left Peroneal Motor (Ext Dig Brev)  34C  Ankle NR  <6.0  >2.5 B Fib Ankle  0.0  >40  B Fib NR     Poplt B Fib  0.0  >40    Poplt NR            Right Peroneal Motor (Ext Dig Brev)  34C  Ankle NR  <6.0  >2.5 B Fib Ankle  0.0  >40  B Fib NR     Poplt B Fib  0.0  >40  Poplt NR            Left Peroneal TA Motor (Tib Ant)  34C  Fib Head    3.1 <4.5 4.0 >3 Poplit Fib Head 1.5 10.0 67 >40  Poplit    4.6  3.8         Right Peroneal TA Motor (Tib Ant)  34C  Fib Head    2.2 <4.5 5.0 >3 Poplit Fib Head 1.6 10.0 63 >40  Poplit    3.8  4.8         Left Tibial Motor (Abd Hall Brev)  34C  Ankle    3.4 <6.0 7.3 >4 Knee Ankle 11.1 44.0 40 >40  Knee    14.5  3.5         Right Tibial Motor (Abd Hall Brev)  34C  Ankle    3.0 <6.0 6.5 >4 Knee Ankle 10.7 43.0 40 >40  Knee    13.7  4.6          H Reflex Studies   NR H-Lat (ms) Lat Norm (  ms) L-R H-Lat (ms)  Left Tibial (Gastroc)  34C     32.24 <35 2.59  Right Tibial (Gastroc)  34C     34.83 <35 2.59   EMG   Side Muscle Ins Act Fibs Psw Fasc Number Recrt Dur Dur. Amp Amp. Poly Poly. Comment  Right AntTibialis Nml Nml Nml Nml Nml Nml Nml Nml Nml Nml Nml Nml N/A  Right Gastroc Nml Nml Nml Nml Nml Nml Nml Nml Nml Nml Nml Nml N/A  Right RectFemoris Nml Nml Nml Nml 1- Rapid Some 1+ Some 1+ Nml Nml N/A  Right GluteusMed Nml Nml Nml Nml Nml Nml Nml Nml Nml Nml Nml Nml N/A  Right Flex Dig Long Nml Nml Nml Nml Nml Nml Nml Nml Nml Nml Nml Nml N/A  Right AdductorLong Nml Nml Nml Nml 1- Rapid Some 1+ Few 1+ Nml Nml N/A  Left AntTibialis Nml Nml Nml Nml 1- Rapid Some 1+ Some 1+ Nml Nml N/A  Left Gastroc Nml Nml Nml Nml Nml Nml Nml Nml Nml Nml Nml Nml N/A  Left Flex Dig Long Nml Nml Nml Nml Nml Nml Nml Nml Nml Nml Nml Nml N/A  Left RectFemoris Nml Nml Nml Nml 1- Rapid Some 1+ Some 2-.3- Nml Nml N/A  Left GluteusMed Nml Nml Nml Nml Nml Nml Nml Nml Nml Nml Nml Nml N/A      Waveforms:

## 2019-09-22 ENCOUNTER — Telehealth: Payer: Self-pay

## 2019-09-22 NOTE — Telephone Encounter (Signed)
-----   Message from Cameron Sprang, MD sent at 09/22/2019 10:28 AM EDT ----- Pls let her know the nerve test showed pinched nerves in her lower back, which can also affect walking and lead to falls. Continue home PT exercises. thanks

## 2019-09-22 NOTE — Telephone Encounter (Signed)
Called patient and informed her of EMG results. Patient verbalized understanding of results and recommendation.

## 2019-09-26 ENCOUNTER — Encounter: Payer: Medicare Other | Admitting: Counselor

## 2019-09-27 ENCOUNTER — Other Ambulatory Visit: Payer: Self-pay

## 2019-09-27 ENCOUNTER — Ambulatory Visit: Payer: Medicare Other | Admitting: Psychology

## 2019-09-27 DIAGNOSIS — F09 Unspecified mental disorder due to known physiological condition: Secondary | ICD-10-CM

## 2019-09-27 NOTE — Progress Notes (Signed)
   Psychometrist Note   Cognitive testing was administered to Shari Horn by Milana Kidney, B.S. (Technician) under the supervision of Alphonzo Severance, Psy.D., ABN. Shari Horn was able to tolerate all test procedures. Rest breaks were offered.    The battery of tests administered was selected by Dr. Nicole Kindred with consideration to the patient's current level of functioning, the nature of her symptoms, emotional and behavioral responses during the interview, level of literacy, observed level of motivation/effort, and the nature of the referral question. This battery was communicated to the psychometrist. Communication between Dr. Nicole Kindred and the psychometrist was ongoing throughout the evaluation and Dr. Nicole Kindred was immediately accessible at all times. Dr. Nicole Kindred provided supervision to the technician on the date of this service, to the extent necessary to assure the quality of all services provided.    Shari Horn will return in approximately one week for an interactive feedback session with Dr. Nicole Kindred, at which time test performance, clinical impressions, and treatment recommendations will be reviewed in detail. The patient understands she can contact our office should she require our assistance before this time.   A total of 100 minutes of billable time were spent with Shari Horn by the technician, including test administration and scoring time. Billing for these services is reflected in Dr. Les Pou note.   This note reflects time spent with the psychometrician and does not include test scores, clinical history, or any interpretations made by Dr. Nicole Kindred. The full report will follow in a separate note.

## 2019-09-28 NOTE — Progress Notes (Signed)
[facility]  Patient Name: JANAYA BROY MRN: [id number] Date of Birth: 09-28-1940 Age: 79 y.o. Education: 18 years  Measurement properties of test scores: IQ, Index, and Standard Scores (SS): Mean = 100; Standard Deviation = 15 Scaled Scores (Ss): Mean = 10; Standard Deviation = 3 Z scores (Z): Mean = 0; Standard Deviation = 1 T scores (T); Mean = 50; Standard Deviation = 10  TEST SCORES:    Note: This summary of test scores accompanies the interpretive report and should not be interpreted by unqualified individuals or in isolation without reference to the report. Test scores are relative to age, gender, and educational history as available and appropriate.   Performance Validity        "A" Random Letter Test Raw  Descriptor      Errors 1 Within Expectation  The Dot Counting Test: 14 Within Expectation      Mental Status Screening     Total Score Descriptor  MoCA 25 Normal      Expected Functioning        Wide Range Achievement Test: Standard/Scaled Score Percentile      Word Reading 120 91      [place] Intellectual Screening Test Standard/T-score Percentile      Guess What 49 46      Odd Item Out 56 73  RIST Index 105 63      Attention/Processing Speed        Neuropsychological Assessment Battery (Attention Module, Form 1): Scaled/T-score Percentile     Digits Forward 46 34     Digits Backwards 46 34      Repeatable Battery for the Assessment of Neuropsychological Status (Form A): Standard Score Percentile     Coding 8 25      Language        Neuropsychological Assessment Battery (Language Module, Form 1): T-score Percentile      Naming   (28) 40 16      Verbal Fluency:  T Score Percentile      Controlled Oral Word Association (F-A-S) 45 31      Semantic Fluency (Animals) 36 8      Memory:        Neuropsychological Assessment Battery (Memory Module, Form 1): T-score/Standard Score Percentile  Memory Index (MEM): 77 6       List Learning           List A Immediate Recall   (3 , 6 , 5) 29 2         List B Immediate Recall   (3) 40 16         List A Short Delayed Recall   (5) 34 5         List A Long Delayed Recall   (3) 30 2         List A Percent Retention   (60 %) --- 12         List A Long Delayed Yes/No Recognition Hits   (11) --- 54         List A Long Delayed Yes/No Recognition False Alarms   (10) --- 3         List A Recognition Discriminability Index --- 3      Shape Learning           Immediate Recognition   (8 , 6 , 4) 58 79         Delayed Recognition   (7) 61 86  Percent Retention   (88 %) --- 34         Delayed Forced-Choice Recognition Hits   (8) --- 58         Delayed Forced-Choice Recognition False Alarms   (1) --- 62         Delayed Forced-Choice Recognition Discriminability --- 66     Story Learning           Immediate Recall   (9, 18) 27 1         Delayed Recall   (17) 36 8         Percent Retention   (94 %) --- 62      Daily Living Memory            Immediate Recall   (22, 18) 42 21          Delayed Recall   (7, 2) 30 2          Percent Retention (60 %) --- 7          Recognition Hits   (9) --- 66      Repeatable Battery for the Assessment of Neuropsychological Status (Form A): Scaled Score Percentile         Figure Recall   (9) 8 25      Visuospatial/Constructional Functioning        Repeatable Battery for the Assessment of Neuropsychological Status (Form A): Standard/Scaled Score Percentile      Visuospatial/Constructional Index 131 98         Figure Copy   (20) 14 91         Judgment of Line Orientation   (20) --- >75      Executive Functioning        Modified [place] Card Sorting Test (MWCST): Standard/T-Score Percentile      Number of Categories Correct 19 <1      Number of Perseverative Errors 19 <1      Number of Total Errors 19 <1      Percent Perseverative Errors 24 <1  Executive Function Composite 54 <1      Trail Making Test: T-Score Percentile       Part A 48 42      Part B 37 9      Boston Diagnostic Aphasia Exam: Raw Score Scaled Score      Complex Ideational Material 11 9      Clock Drawing Raw Score Descriptor      Command 8 Borderline Impairment      Rating Scales         Raw Score Descriptor  Geriatric Depression Scale - Short Form 3 Negative   Ladaysha Soutar V. Nicole Kindred PsyD, [place] Clinical Neuropsychologist

## 2019-09-29 ENCOUNTER — Ambulatory Visit: Payer: Medicare Other | Admitting: Neurology

## 2019-10-02 DIAGNOSIS — Z20822 Contact with and (suspected) exposure to covid-19: Secondary | ICD-10-CM | POA: Diagnosis not present

## 2019-10-02 NOTE — Progress Notes (Signed)
[facility]  Patient Name: Shari Horn MRN: [id number] Date of Birth: 1940/08/03 Age: 79 y.o. Education: 46 years  Clinical Impressions  Shari Horn is a 79 y.o., right-hand dominant, divorced woman with a history of HLD, hypothyroidism, carotid artery disease, bipolar II disorder and concerns about her memory and thinking over the past while. The patient supplied her own history and has noticed day-to-day forgetfulness, some minor difficulties with bill payment, and problems with word finding. She has an MRI that shows a fair burden of atrophy that is generalized, a mild burden of leukoaraiosis, and bilateral areas of possible lacunar infarct in the basal ganglia/thalamus.   On neuropsychological testing, Shari Horn demonstrated less than expected performance as compared to her age and education cohort on memory measures. The profile shows mainly memory encoding problems as opposed to a clear storage type difficulty although a developing storage problem cannot be entirely excluded. She also has some decline in executive abilities, as suggested by an extremely low Executive Function Composite score on the Modified [employer] and weak performance on the [employer]. Performance was within normal limits in other areas including on measures of processing speed, attention, and she had very good visuospatial functioning as may be expected given her involvement in art. She screened negative for the presence of depression.   Shari Horn is thus demonstrating a mild cognitive impairment level problem. 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 Would recommend reevaluation in 1 to 2 years and healthy lifestyle changes. Antidementia medication could be considered at the  referring provider's discretion but is not clearly needed given very mild difficulties at this point in time.   Diagnostic Impressions: Mild cognitive impairment Bipolar II disorder (by history)  Recommendations to be discussed with patient  Your performance and presentation at this assessment were consistent with very mild cognitive changes, showing with less than expected memory performance and some low scores on additional measures of executive functioning, which is a higher-order ability involving regulation of attention, problem solving, cognitive flexibility, and problem solving under conditions of opacity, as you may be aware. The good news is that your memory problem is not clearly of a storage type, it seems rather that difficulties getting information in are the primary problem.   The major difference between mild cognitive impairment (MCI) and dementia is in severity and potential prognosis. Once someone reaches a level of severity adequate to be diagnosed with a dementia, there is usually progression over time, though this may be years. On the other hand, mild cognitive impairment, while a significant risk for dementia in future, does not always progress to dementia, and in some instances stays the same or can even revert to normal. It is important to realize that if MCI is due to underlying Alzheimer's disease, it will most likely progress to dementia eventually. The rate of conversion to Alzheimer's dementia from amnestic MCI is about 15% per year versus the general population risk of conversion of 2% per year.   In your case, I am not entirely sure what is causing your memory and thinking problems. Your memory problems involve difficulties getting information in as opposed to retaining information across time, which is positive, because problems retaining information (I.e., memory storage problems) are most commonly associated with Alzheimer's disease,  the most common cause of age-related  cognitive decline in your demographic. You do have some mild vascular disease on your scan including possible small old strokes in areas of the brain that are important for cognition and that is one possible cause. If that is the cause, then your difficulties do not need to get worse over time with adequate control of risk factors. Some level of cognitive difficulty is also often encountered in the setting of aging in bipolar disorder and some of the medications you are on can also exert cognitively interfering side effects. My best advice would be to implement healthy lifestyle changes that have been shown to help slow the rate of decline specifically in Alzheimer's disease but are also good for vascular disease and to return for reevaluation in 1 to 2 years or as clinically indicated.   Cerebrovascular disease is a major contributor to cognitive decline. Most people are familiar with stroke, which involves acute disruptions in blood flow to the brain that result in damage to underlying brain tissue. Small vessel ischemic disease is less well known but is more common and can be equally problematic. Small vessel ischemic disease involves wear and tear on small blood vessels in the brain over time and increase with age, affecting nearly 100% of people in those older than 90 years. Small vessel ischemic disease increases risk for dementia and has been associated with Alzheimer's dementia in some studies. The best way to minimize cerebrovascular risk is to actively manage risk factors, including hypertension and high cholesterol. Medications can be helpful but maintaining a healthy body weight, getting regular exercise, and eating a heart-healthy diet such as the MIND diet or DASH diet are also crucially important.   There is now good quality evidence from at least one large scale study that a modified mediterranean diet may help slow cognitive decline. This is known as the "MIND" diet. The Mind diet is not so much  a specific diet as it is a set of recommendations for things that you should and should not eat.   Foods that are ENCOURAGED on the MIND Diet:  Green, leafy vegetables: Aim for six or more servings per week. This includes kale, spinach, cooked greens and salads.  All other vegetables: Try to eat another vegetable in addition to the green leafy vegetables at least once a day. It is best to choose non-starchy vegetables because they have a lot of nutrients with a low number of calories.  Berries: Eat berries at least twice a week. There is a plethora of research on strawberries, and other berries such as blueberries, raspberries and blackberries have also been found to have antioxidant and brain health benefits.  Nuts: Try to get five servings of nuts or more each week. The creators of the [place] don't specify what kind of nuts to consume, but it is probably best to vary the type of nuts you eat to obtain a variety of nutrients. Peanuts are a legume and do not fall into this category.  Olive oil: Use olive oil as your main cooking oil. There may be other heart-healthy alternatives such as algae oil, though there is not yet sufficient research upon which to base a formal recommendation.  Whole grains: Aim for at least three servings daily. Choose minimally processed grains like oatmeal, quinoa, [place] rice, whole-wheat pasta and 100% whole-wheat bread.  Fish: Eat fish at least once a week. It is best to choose fatty fish like salmon, sardines, trout, tuna and mackerel  for their high amounts of omega-3 fatty acids.  Beans: Include beans in at least four meals every week. This includes all beans, lentils and soybeans.  Poultry: Try to eat chicken or [place] at least twice a week. Note that fried chicken is not encouraged on the MIND diet.  Wine: Aim for no more than one glass of alcohol daily. Both red and white wine may benefit the brain. However, much research has focused on the red wine compound  resveratrol, which may help protect against Alzheimer's disease.  Foods that are DISCOURAGED on the MIND Diet: Butter and margarine: Try to eat less than 1 tablespoon (about 14 grams) daily. Instead, try using olive oil as your primary cooking fat, and dipping your bread in olive oil with herbs.  Cheese: The MIND diet recommends limiting your cheese consumption to less than once per week.  Red meat: Aim for no more than three servings each week. This includes all beef, pork, lamb and products made from these meats.  [place] food: The MIND diet highly discourages fried food, especially the kind from fast-food restaurants. Limit your consumption to less than once per week.  Pastries and sweets: This includes most of the processed junk food and desserts you can think of. Ice cream, cookies, brownies, snack cakes, donuts, candy and more. Try to limit these to no more than four times a week.  Exercise is one of the best medicines for promoting health and maintaining cognitive fitness at all stages in life. Exercise probably has the largest documented effect on brain health and performance of any lifestyle intervention. Studies have shown that even previously sedentary individuals who start exercising as late as age 37 show a significant survival benefit as compared to their non-exercising peers. In the [place], the current guidelines are for 30 minutes of moderate exercise per day, but increasing your activity level less than that may also be helpful. You do not have to get your 30 minutes of exercise in one shot and exercising for short periods of time spread throughout the day can be helpful. Go for several walks, learn to dance, or do something else you enjoy that gets your body moving. Of course, if you have an underlying medical condition or there is any question about whether it is safe for you to exercise, you should consult a medical treatment provider prior to beginning exercise.   Test Findings    Test scores are summarized in additional documentation associated with this encounter. Test scores are relative to age, gender, and educational history as available and appropriate. There were no concerns about performance validity as all findings fell within normal expectations.   General Intellectual Functioning/Achievement:  Performance on single word reading was very good, generating a score in the superior range. Performance on the RIST index was more modest, falling at an average level overall. Performance on the more verbally oriented subtest was average and performance on the more visually oriented subtest was toward the highest aspect of the average range. Average to above average present as reasonable standards of comparison for the patient's cognitive test performance  Attention and Processing Efficiency: Indicators of attention generated reasonable average range scores in the average range on measures of digit repetition forward and backward.   Timed processing speed indicators were within normal limits with average timed number-symbol coding and simple numeric sequencing.   Language: Performance was low average on the fundamental ability of visual object confrontation naming. Timed generation of words was variable with somewhat weak  unusually low performance in the category modality and average performance in response to the letters F-A-S.  Visuospatial Function: Performance on visuospatial and constructional indicators was very good with very superior performance on the overall index. 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   Learning and Memory: Learning and memory measures fell somewhat below expectations for an individual of Ms. Pichon's presumed premorbid ability relative to her age and education cohort, due to low scores on verbally mediated measures. However, some of this may be  due to the stringent standards imposed on individuals with 18 years of education, and this may be exerting some downward pressure on her scores. The profile shows mainly encoding and efficiency issues as opposed to a clear memory storage problem per se. Retention of information across time was reasonable in most cases.   In the verbal realm, Ms. Carchi's immediate recall for a 12-item word list was 3, 6, and 5 words across three learning trials followed by 5 words at short delayed recall and 3 words at long delayed recall, which is extremely low for initial learning and unusually low for delayed recall. Recognition discriminability for words from the list versus false choices was low and generated an unusually low score. Retention across time was weak, falling toward the margin of the low average and unusually low ranges. Memroy for a brief short story was extremely low but retention across time was good in this case. Nevertheless, delayed recall was still unusually low due to the amount of information encoded initially. Memory for brief daily-living type information was low average on immediate recall and unusually low on delayed recall. Recognition of the information amongst false choices was good and fell at an average level.   In the visual realm, Ms. Leachman's immediate recall for a series of designs that is difficulty to verbally encode was good with high average immediate recognition and delayed recognition. Recognition discriminability was average. Delayed recall for a modestly complex geometric figure was average.   Executive Functions: Performance within this domain was somewhat below expectation with an extremely low score on the executive function composite of the [place] Test. She had extremely low categories and perseverative errors scores, perseverating on the same category for the entire rest of the test despite being prompted not to. Alternating sequencing of numbers and  letters of the alphabet was weak falling at the margin of the low average and unusually low ranges. Clock drawing was borderline with incorrect sizes for the hands (which were set appropriately) and minor errors in number placement. She did better with generation of words in response to the letters F-A-S and reasoning with verbal information on the complex ideational material, both of which were average.   Rating Scale(s): Ms. Gharibian screened negative for the presence of depression. Her global CDR score is 0.5 and her Sum of Boxes is 1.5, which is in the questionable impairment to mild cognitive impairment range.   Viviano Simas Nicole Kindred PsyD, [place] Clinical Neuropsychologist

## 2019-10-04 ENCOUNTER — Encounter: Payer: Self-pay | Admitting: Counselor

## 2019-10-04 ENCOUNTER — Ambulatory Visit (INDEPENDENT_AMBULATORY_CARE_PROVIDER_SITE_OTHER): Payer: Medicare Other | Admitting: Counselor

## 2019-10-04 ENCOUNTER — Other Ambulatory Visit: Payer: Self-pay

## 2019-10-04 DIAGNOSIS — G3184 Mild cognitive impairment, so stated: Secondary | ICD-10-CM | POA: Diagnosis not present

## 2019-10-04 NOTE — Patient Instructions (Signed)
Your performance and presentation at this assessment were consistent with very mild cognitive changes, showing with less than expected memory performance and some low scores on additional measures of executive functioning, which is a higher-order ability involving regulation of attention, cognitive flexibility, and problem solving under conditions of uncertainty, as you may be aware. The good news is that your memory problem is not clearly of a storage type, it seems rather that difficulties getting information in are the primary problem. Storage type memory problems are more concerning for developing Alzheimer's disease, whereas your problems could be due to a number of factors and are at no more than a mild cognitive impairment level.  The major difference between mild cognitive impairment (MCI) and dementia is in severity and potential prognosis. Once someone reaches a level of severity adequate to be diagnosed with a dementia, there is usually progression over time, though this may be years. On the other hand, mild cognitive impairment, while a significant risk for dementia in future, does not always progress to dementia, and in some instances stays the same or can even revert to normal.It is important to realize that if MCI is due to underlying Alzheimer's disease, it will most likely progress to dementia eventually. The rate of conversion to Alzheimer's dementiafrom amnestic MCI is about 15% per year versus the general population risk of conversion of 2% per year.  In your case, I am not entirely sure what is causing your memory and thinking problems. Your memory problems involve difficulties getting information in as opposed to retaining information across time, which is positive, because problems retaining information (I.e., memory storage problems) are most commonly associated with Alzheimer's disease, the most common cause of age-related cognitive decline in your demographic. You do have some mild  vascular disease on your scan including possible small old lacunar infarcts in areas of the brain that are important for cognition and that is one possible cause. If that is the cause, then your difficulties do not need to get worse over time with adequate control of risk factors. Some level of cognitive difficulty is also often encountered in the setting of aging in bipolar disorder and some of the medications you are on can also exert cognitively interfering side effects. My best advice would be to implement healthy lifestyle changes that have been shown to help slow the rate of decline specifically in Alzheimer's disease but are also good for vascular disease and to return for reevaluation in 1 to 2 years or as clinically indicated.   Cerebrovascular disease is a major contributor to cognitive decline. Most people are familiar with stroke, which involves acute disruptions in blood flow to the brain that result in damage to underlying brain tissue. Small vessel ischemic disease is less well known but is more common and can be equally problematic. Small vessel ischemic disease involves wear and tear on small blood vessels in the brain over time and increase with age, affecting nearly 100% of people in those older than 90 years. Small vessel ischemic disease increases risk for dementia and has been associated with Alzheimer's dementia in some studies. The best way to minimize cerebrovascular risk is to actively manage risk factors, including hypertension and high cholesterol. Medications can be helpful but maintaining a healthy body weight, getting regular exercise, and eating a heart-healthy diet such as the MIND diet or DASH diet are also crucially important.   There is now good quality evidence from at least one large scale study that a modified mediterranean diet  may help slow cognitive decline. This is known as the "MIND" diet. The Mind diet is not so much a specific diet as it is a set of recommendations  for things that you should and should not eat.   Foods that are ENCOURAGED on the MIND Diet:  Green, leafy vegetables: Aim for six or more servings per week. This includes kale, spinach, cooked greens and salads.  All other vegetables: Try to eat another vegetable in addition to the green leafy vegetables at least once a day. It is best to choose non-starchy vegetables because they have a lot of nutrients with a low number of calories.  Berries: Eat berries at least twice a week. There is a plethora of research on strawberries, and other berries such as blueberries, raspberries and blackberries have also been found to have antioxidant and brain health benefits.  Nuts: Try to get five servings of nuts or more each week. The creators of the [place] don't specify what kind of nuts to consume, but it is probably best to vary the type of nuts you eat to obtain a variety of nutrients. Peanuts are a legume and do not fall into this category.  Olive oil: Use olive oil as your main cooking oil. There may be other heart-healthy alternatives such as algae oil, though there is not yet sufficient research upon which to base a formal recommendation.  Whole grains: Aim for at least three servings daily. Choose minimally processed grains like oatmeal, quinoa, [place] rice, whole-wheat pasta and 100% whole-wheat bread.  Fish: Eat fish at least once a week. It is best to choose fatty fish like salmon, sardines, trout, tuna and mackerel for their high amounts of omega-3 fatty acids.  Beans: Include beans in at least four meals every week. This includes all beans, lentils and soybeans.  Poultry: Try to eat chicken or [place] at least twice a week. Note that fried chicken is not encouraged on the MIND diet.  Wine: Aim for no more than one glass of alcohol daily. Both red and white wine may benefit the brain. However, much research has focused on the red wine compound resveratrol, which may help protect against Alzheimer's  disease.  Foods that are DISCOURAGED on the MIND Diet: Butter and margarine: Try to eat less than 1 tablespoon (about 14 grams) daily. Instead, try using olive oil as your primary cooking fat, and dipping your bread in olive oil with herbs.  Cheese: The MIND diet recommends limiting your cheese consumption to less than once per week.  Red meat: Aim for no more than three servings each week. This includes all beef, pork, lamb and products made from these meats.  [employer] food: The MIND diet highly discourages fried food, especially the kind from fast-food restaurants. Limit your consumption to less than once per week.  Pastries and sweets: This includes most of the processed junk food and desserts you can think of. Ice cream, cookies, brownies, snack cakes, donuts, candy and more. Try to limit these to no more than four times a week.  Exercise is one of the best medicines for promoting health and maintaining cognitive fitness at all stages in life. Exercise probably has the largest documented effect on brain health and performance of any lifestyle intervention. Studies have shown that even previously sedentary individuals who start exercising as late as age 75 show a significant survival benefit as compared to their non-exercising peers. In the [place], the current guidelines are for 30 minutes of moderate  exercise per day, but increasing your activity level less than that may also be helpful. You do not have to get your 30 minutes of exercise in one shot and exercising for short periods of time spread throughout the day can be helpful. Go for several walks, learn to dance, or do something else you enjoy that gets your body moving. Of course, if you have an underlying medical condition or there is any question about whether it is safe for you to exercise, you should consult a medical treatment provider prior to beginning exercise.

## 2019-10-04 NOTE — Progress Notes (Signed)
[name]  Telemedicine statement:  I discussed the limitations of neuropsychological care via telemedicine and the availability of in person appointments. The patient expressed understanding and agreed to proceed. The patient was verified with two identifiers.  The visit modality was: telephonic The patient location was: home The provider location was: office  The following individuals participated: Shari Horn  Feedback Note: I met with Shari Horn to review the findings resulting from her neuropsychological evaluation. Her daughter had initially planned on participating, although she wasn't feeling well and as a result, I spoke with the patient alone. Since the last appointment, she has been about the same. She did get "very depressed" after the testing, because she felt discredited, but was able to use self-talk and stated she was feeling much better at the time of the appointment. We processed that at length and I clarified that it is not pass fail testing, much of it is designed so that it is virtually impossible to get all items correct, and that it is simply more information for care planning. Time was spent reviewing the impressions and recommendations that are detailed in the evaluation report. We discussed impression of mild cognitive impairment, though not clear if it represents a prodrome or simply a combination of aging in the setting of bipolar disorder, vascular disease, medication side effects and the like. I reviewed with her dietary and lifestyle changes for mitigating any further decline, recommended she return in 1-2 years as clinically indicated for re-evaluation. She had positive things to say about the interpersonal aspects of her care here, and presented as satisfied with the information provided. I took time to explain the findings and answer all the patient's questions. I encouraged Ms. [name] to contact me should she have any further  questions or if further follow up is desired.   Current Medications and Medical History   Current Outpatient Medications  Medication Sig Dispense Refill  . cholecalciferol (VITAMIN D3) 25 MCG (1000 UT) tablet Take 2,000 Units by mouth daily.    . clonazePAM (KLONOPIN) 0.5 MG tablet Take 1 tablet (0.5 mg total) by mouth 3 (three) times daily as needed for anxiety. 15 tablet 0  . ezetimibe (ZETIA) 10 MG tablet Take 1 tablet (10 mg total) by mouth daily. 90 tablet 3  . FLUoxetine (PROZAC) 20 MG tablet Take 3 tablets (60 mg total) by mouth daily. 270 tablet 3  . Fluoxetine HCl, PMDD, 20 MG TABS fluoxetine 20 mg tablet   60 mg by oral route.    . lamoTRIgine (LAMICTAL) 100 MG tablet Take 100 mg by mouth daily. (Patient not taking: Reported on 07/31/2019)    . levothyroxine (SYNTHROID) 75 MCG tablet TAKE 1 TABLET(75 MCG) BY MOUTH DAILY 90 tablet 1  . meclizine (ANTIVERT) 25 MG tablet Take 1 tablet (25 mg total) by mouth 3 (three) times daily as needed for dizziness. 30 tablet 0  . Multiple Vitamin (MULTIVITAMIN WITH MINERALS) TABS tablet Take 1 tablet by mouth daily.    . naproxen sodium (ALEVE) 220 MG tablet Take 440 mg by mouth 2 (two) times daily.    . pantoprazole (PROTONIX) 20 MG tablet Take 40 mg by mouth daily as needed for heartburn.     . pantoprazole (PROTONIX) 40 MG tablet TAKE 1 TABLET(40 MG) BY MOUTH DAILY 90 tablet 3  . QUEtiapine (SEROQUEL) 300 MG tablet Take 1 tablet (300 mg total) by mouth at bedtime. 90 tablet 3  . rosuvastatin (CRESTOR) 40 MG  tablet Take 1 tablet (40 mg total) by mouth daily. Please make apt for future refills 1st attempt thank you [phone]. 30 tablet 0  . senna (SENOKOT) 8.6 MG TABS tablet Take 5 tablets by mouth at bedtime.      No current facility-administered medications for this visit.    Patient Active Problem List   Diagnosis Date Noted  . Pelvic fracture ([place]) 10/20/2018  . Forgetfulness 03/10/2017  . Unsteady gait 01/25/2017  . Gastroesophageal  reflux disease without esophagitis 10/12/2016  . Osteopenia 09/17/2016  . Dysphagia 09/14/2016  . Bipolar 2 disorder ([place]) 09/14/2016  . Hypothyroidism 09/14/2016  . Bilateral carotid artery disease ([place]) 12/20/2012  . PVC's (premature ventricular contractions) 12/20/2012  . Dyslipidemia 12/20/2012    Mental Status and Behavioral Observations  FINA HEIZER was available at the prespecified time for this telephonic appointment and was alert and generally oriented (orientation not formally assessed). Speech was normal in rate, rhythm, volume, and prosody. Self-reported mood was "I feel much better now" and affect as assessed by vocal quality was mainly euthymic. Thought process was logical and goal oriented and thought content was pertinent. There were no safety concerns identified at today's encounter, such as thoughts of harming self or others.   Plan  Feedback provided regarding the patient's neuropsychological evaluation. She has mild cognitive impairment, but it is not clear that it is necessarily a prodrome at risk for decline and may be due to other factors. I encouraged her to engage in lifestyle changes and follow up in 1 to 2 years. She requested that I mail a copy of her report to her so that she may share it with her daughter, which I did today. She presented as quite appreciative of the evaluation and as though her expectations were met. Shari Horn was encouraged to contact me if any questions arise or if further follow up is desired.   Shari Simas Nicole Kindred, PsyD, ABN Clinical Neuropsychologist  Service(s) Provided at This Encounter: 79 minutes [phone]; Psychotherapy with patient/family)

## 2019-10-23 ENCOUNTER — Ambulatory Visit (HOSPITAL_COMMUNITY)
Admission: RE | Admit: 2019-10-23 | Payer: Medicare Other | Source: Ambulatory Visit | Attending: Internal Medicine | Admitting: Internal Medicine

## 2019-10-24 ENCOUNTER — Ambulatory Visit: Payer: Medicare Other | Admitting: Family Medicine

## 2019-10-31 ENCOUNTER — Ambulatory Visit (HOSPITAL_COMMUNITY): Payer: Medicare Other

## 2019-11-01 ENCOUNTER — Other Ambulatory Visit: Payer: Self-pay | Admitting: Internal Medicine

## 2019-11-01 NOTE — Telephone Encounter (Signed)
Rx request sent to pharmacy.  

## 2019-11-07 ENCOUNTER — Encounter (HOSPITAL_COMMUNITY): Payer: Medicare Other

## 2019-11-08 DIAGNOSIS — H35371 Puckering of macula, right eye: Secondary | ICD-10-CM | POA: Diagnosis not present

## 2019-11-08 DIAGNOSIS — H5213 Myopia, bilateral: Secondary | ICD-10-CM | POA: Diagnosis not present

## 2019-11-08 DIAGNOSIS — H04123 Dry eye syndrome of bilateral lacrimal glands: Secondary | ICD-10-CM | POA: Diagnosis not present

## 2019-11-16 ENCOUNTER — Ambulatory Visit (HOSPITAL_COMMUNITY)
Admission: RE | Admit: 2019-11-16 | Discharge: 2019-11-16 | Disposition: A | Discharge status: Home / Self Care | Payer: Medicare Other | Source: Ambulatory Visit | Attending: Cardiovascular Disease | Admitting: Cardiovascular Disease

## 2019-11-16 ENCOUNTER — Other Ambulatory Visit: Payer: Self-pay

## 2019-11-16 DIAGNOSIS — I6523 Occlusion and stenosis of bilateral carotid arteries: Secondary | ICD-10-CM | POA: Insufficient documentation

## 2019-11-28 DIAGNOSIS — H04123 Dry eye syndrome of bilateral lacrimal glands: Secondary | ICD-10-CM | POA: Diagnosis not present

## 2019-12-07 ENCOUNTER — Encounter: Payer: Self-pay | Admitting: Internal Medicine

## 2019-12-09 ENCOUNTER — Other Ambulatory Visit: Payer: Self-pay | Admitting: Internal Medicine

## 2020-01-19 ENCOUNTER — Other Ambulatory Visit: Payer: Self-pay | Admitting: Family Medicine

## 2020-01-31 ENCOUNTER — Ambulatory Visit: Payer: Medicare Other | Admitting: Neurology

## 2020-02-13 ENCOUNTER — Other Ambulatory Visit: Payer: Self-pay

## 2020-02-13 ENCOUNTER — Encounter: Payer: Self-pay | Admitting: Neurology

## 2020-02-13 ENCOUNTER — Ambulatory Visit: Payer: Medicare Other | Admitting: Neurology

## 2020-02-13 VITALS — BP 131/65 | HR 77 | Ht 67.0 in | Wt 179.0 lb

## 2020-02-13 DIAGNOSIS — G3184 Mild cognitive impairment, so stated: Secondary | ICD-10-CM

## 2020-02-13 DIAGNOSIS — M5417 Radiculopathy, lumbosacral region: Secondary | ICD-10-CM | POA: Diagnosis not present

## 2020-02-13 NOTE — Patient Instructions (Signed)
Good to see you!  1. If you have another episode where you black out, please let Dr. Debara Pickett know.   2. Continue to monitor memory, we can repeat memory testing in a year (August 2022) if needed  3. Follow-up in 6-8 months, call for any changes   RECOMMENDATIONS FOR ALL PATIENTS WITH MEMORY PROBLEMS: 1. Continue to exercise (Recommend 30 minutes of walking everyday, or 3 hours every week) 2. Increase social interactions - continue going to [place] and enjoy social gatherings with friends and family 3. Eat healthy, avoid fried foods and eat more fruits and vegetables 4. Maintain adequate blood pressure, blood sugar, and blood cholesterol level. Reducing the risk of stroke and cardiovascular disease also helps promoting better memory. 5. Avoid stressful situations. Live a simple life and avoid aggravations. Organize your time and prepare for the next day in anticipation. 6. Sleep well, avoid any interruptions of sleep and avoid any distractions in the bedroom that may interfere with adequate sleep quality 7. Avoid sugar, avoid sweets as there is a strong link between excessive sugar intake, diabetes, and cognitive impairment We discussed the Mediterranean diet, which has been shown to help patients reduce the risk of progressive memory disorders and reduces cardiovascular risk. This includes eating fish, eat fruits and green leafy vegetables, nuts like almonds and hazelnuts, walnuts, and also use olive oil. Avoid fast foods and fried foods as much as possible. Avoid sweets and sugar as sugar use has been linked to worsening of memory function.

## 2020-02-13 NOTE — Progress Notes (Signed)
NEUROLOGY FOLLOW UP OFFICE NOTE  Shari Horn [id number] 09-15-1940  HISTORY OF PRESENT ILLNESS: I had the pleasure of seeing Shari Horn in follow-up in the neurology clinic on 02/13/2020.  The patient was last seen 6 months ago for memory loss and frequent falls. Records and images were personally reviewed where available. She underwent Neuropsychological testing in August 2021 which noted less than expected performance as compared to her age and education cohort on memory measures. Her profile mainly showed memory encoding problems as opposed to a clear storage type difficulty. Diagnosis of Mild Cognitive Impairment, etiology unclear, possibly due to mild microvascular disease, aging in the setting of bipolar disorder, and medication side effects. 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  Since her last visit, she denies any falls. She has not been able to do much of the home PT exercises mostly because she has been getting so down with being at home from the pandemic. Today she reports a different type of fall that has occurred several times in the past, last episode was 2 years ago. She was doing yoga and holding on the back of the chair when she blacked out and woke up on the floor. She states she came to right away. She had a similar episode on an escalator in [place] and several times while traveling. She feels her memory is about the same, she would forget what she came to get in a room, then it comes back to her. She denies getting lost driving. She denies missing medications or bill payments. She tried to cut down on Seroquel but did not feel good on lower dose, she is back on 300mg  qhs.    History on Initial Assessment 07/31/2019:  This is a 80 year old right-handed woman with a history of hyperlipidemia, hypothyroidism, carotid artery disease, vertigo, bipolar disorder,  seen in our office in 2015 and 2018 for vertigo and headaches. She presents today for new symptoms of memory loss and frequent falls.   1. Memory loss. Memory changes started becoming noticeable the past 2 years. She lives alone. She would forget what she came in a room to get. She loses her keys around the house. She could not remember the name of the bush outside her house. She denies getting lost driving, but feels like she is not as alert like she was before. She occasionally would not recall if she took her morning medications, she generally remembers to take her night doses. She tried using a pillbox but prefers to take it from memory, she did not have any difficulties using it, she just did not want to do it. A couple of months ago she forgot to pay her phone bill and her phone was cut off. She has not left the stove on. She is an Investment banker, corporate. She reads a book a week and likes to write letters, she has not noticed any issues doing these. Her older sister is forgetful. No history of significant head injuries with loss of consciousness, no alcohol use.   2. She has had 3 significant falls since 2019. The first fall occurred in 11/2017 while she was in a dark movie theater, she was walking up the stairs and misjudged, grabbing the handrail but missing it and falling to her left, sustaining a left hip fracture requiring hip surgery. The next fall occurred in 09/2018 when she  woke up at night to use the bathroom, as she turned she tried to grab the bed post but could not reach it and fell. She was found to have a nondisplaced left inferior pubic ramus fracture and underwent PT. The last fall was in 02/2019 as she was leaving her sister's house, she was going down 2 steps and fell, unable to get up. Her son had to be called to get her up and she was found to have a right foot fracture. She denies any paresthesias in her feet. She has occasional tingling in the fingers of her left hand. She has had  left-sided neck pain. She had back surgery 14-15 years ago which took care of back pain, but she has an "occasional ping" in her back. No bowel/bladder dysfunction. Lumbar xray in 09/2018 showed advanced degenerative disease with levoscoliosis and L4-5 anterolisthesis.   She denies any headaches, dysarthria/dysphagia. She has occasional vertigo that resolves without intervention. The past 2 months she has felt like she has brief horizontal diplopia. She sleeps well with Seroquel, she has 300mg  tablets but cuts them and takes 2/3 tablet due to family concern that falls were medication-related. She takes clonazepam as needed, usually only twice a week. She self-discontinued Lamictal 6 months ago. She feels her mood is generally pretty good.   I personally reviewed MRI brain without contrast done 04/2019 which did not show any acute changes, there was diffuse volume loss since 2018, no disproportionate areas of brain atrophy seen. There was mild chronic microvascular disease.  She had carotid dopplers showing 50-69% left internal carotid artery stenosis. There was a mild amount of plaque in the right carotid, 0-49% stenosis. Echo showed an EF of 37-85%, mild diastolic dysfunction. There was trivial mitral regurgitation and aortic regurgitation, but no significant stenosis.   Diagnostic Data: Lab Results  Component Value Date   TSH 2.94 04/13/2019   Lab Results  Component Value Date   VITAMINB12 >1500 ([facility]) 04/13/2019    PAST MEDICAL HISTORY: Past Medical History:  Diagnosis Date  . Carotid artery disease ([place])   . Depression   . Dyslipidemia   . Hyperlipidemia   . Hypothyroidism   . 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 PVC's (premature ventricular contractions)     MEDICATIONS: Current Outpatient Medications on File Prior to Visit  Medication Sig Dispense Refill  . cholecalciferol (VITAMIN D3) 25 MCG (1000 UT) tablet Take 2,000 Units by mouth daily.    . clonazePAM (KLONOPIN) 0.5 MG tablet Take  1 tablet (0.5 mg total) by mouth 3 (three) times daily as needed for anxiety. 15 tablet 0  . ezetimibe (ZETIA) 10 MG tablet Take 1 tablet (10 mg total) by mouth daily. 90 tablet 3  . FLUoxetine (PROZAC) 20 MG tablet Take 3 tablets (60 mg total) by mouth daily. 270 tablet 3  . Fluoxetine HCl, PMDD, 20 MG TABS fluoxetine 20 mg tablet   60 mg by oral route.    . lamoTRIgine (LAMICTAL) 100 MG tablet Take 100 mg by mouth daily. (Patient not taking: Reported on 07/31/2019)    . levothyroxine (SYNTHROID) 75 MCG tablet TAKE 1 TABLET(75 MCG) BY MOUTH DAILY 90 tablet 1  . meclizine (ANTIVERT) 25 MG tablet Take 1 tablet (25 mg total) by mouth 3 (three) times daily as needed for dizziness. 30 tablet 0  . Multiple Vitamin (MULTIVITAMIN WITH MINERALS) TABS tablet Take 1 tablet by mouth daily.    . naproxen sodium (ALEVE) 220 MG tablet Take 440  mg by mouth 2 (two) times daily.    . pantoprazole (PROTONIX) 20 MG tablet Take 40 mg by mouth daily as needed for heartburn.     . pantoprazole (PROTONIX) 40 MG tablet TAKE 1 TABLET(40 MG) BY MOUTH DAILY 90 tablet 3  . QUEtiapine (SEROQUEL) 300 MG tablet Take 1 tablet (300 mg total) by mouth at bedtime. 90 tablet 3  . rosuvastatin (CRESTOR) 40 MG tablet TAKE 1 TABLET BY MOUTH DAILY 90 tablet 1  . senna (SENOKOT) 8.6 MG TABS tablet Take 5 tablets by mouth at bedtime.      No current facility-administered medications on file prior to visit.    ALLERGIES: Allergies  Allergen Reactions  . Codeine Nausea And Vomiting and Nausea Only    Other reaction(s): Unknown    FAMILY HISTORY: Family History  Problem Relation Age of Onset  . Heart failure Mother   . Thyroid disease Mother   . Hypertension Mother   . Diabetes Mother   . Arthritis/Rheumatoid Father   . Heart Problems Maternal Grandmother   . Heart disease Brother        valve replacement  . Heart disease Child   . Colon cancer Neg Hx   . Esophageal cancer Neg Hx   . Stomach cancer Neg Hx     SOCIAL  HISTORY: Social History   Socioeconomic History  . Marital status: Divorced    Spouse name: Not on file  . Number of children: 2  . Years of education: master's  . Highest education level: Not on file  Occupational History  . Not on file  Tobacco Use  . Smoking status: Former Smoker    Years: 10.00    Types: Cigarettes    Quit date: 12/19/1972    Years since quitting: 47.1  . Smokeless tobacco: Never Used  Vaping Use  . Vaping Use: Never used  Substance and Sexual Activity  . Alcohol use: No    Comment: quit in 1973  . Drug use: No  . Sexual activity: Not on file  Other Topics Concern  . Not on file  Social History Narrative   Right handed   Two story home   Drinks caffeine   Social Determinants of Health   Financial Resource Strain: Not on file  Food Insecurity: Not on file  Transportation Needs: Not on file  Physical Activity: Not on file  Stress: Not on file  Social Connections: Not on file  Intimate Partner Violence: Not on file     PHYSICAL EXAM: Vitals:   02/13/20 1459  BP: 131/65  Pulse: 77  SpO2: 95%   General: No acute distress Head:  Normocephalic/atraumatic Skin/Extremities: No rash, no edema Neurological Exam: alert and awake. No aphasia or dysarthria. Fund of knowledge is appropriate.  Attention and concentration are normal.   Cranial nerves: Pupils equal, round. Extraocular movements intact with no nystagmus. Visual fields full.  No facial asymmetry.  Motor: Bulk and tone normal, muscle strength 5/5 throughout with no pronator drift.   Finger to nose testing intact.  Gait slow and cautious with cane, no ataxia   IMPRESSION: This is an 80 yo RH woman with a history of hyperlipidemia, hypothyroidism, carotid artery disease, vertigo, bipolar disorder, who presented for memory loss and frequent falls. MRI brain no acute changes, there is mild diffuse volume loss and chronic microvascular disease. Neuropsychological evaluation in August 2021 indicated  Mild Cognitive Impairment, etiology possibly due to microvascular disease, aging in the setting of bipolar disorder, and  medication side effects. Memory overall stable, she denies any difficulties with complex tasks. If symptoms change, we will repeat Neurocognitive testing in 1-2 years. She has not had any further falls since her last visit, we discussed EMG showing lumbar radiculopathy, continue with PT exercises. She provides additional information today of a different type of fall where she very briefly blacks out, none in the past 2 years, suggestive of vasovagal syncope. If symptoms recur, she knows to alert her cardiologist as well. We discussed the importance of control of vascular risk factors, physical exercise, and brain stimulation exercises for brain health. Follow-up in 6-8 months, she knows to call for any changes.   Thank you for allowing me to participate in her care.  Please do not hesitate to call for any questions or concerns.   Ellouise Newer, M.D.   CC: Dr. Jerline Pain

## 2020-02-21 ENCOUNTER — Other Ambulatory Visit: Payer: Self-pay | Admitting: Family Medicine

## 2020-02-21 NOTE — Telephone Encounter (Signed)
LAST APPOINTMENT DATE: 07/18/2019   NEXT APPOINTMENT DATE: Visit date not found    LAST REFILL: 10/22/2018  QTY:

## 2020-02-23 ENCOUNTER — Ambulatory Visit (INDEPENDENT_AMBULATORY_CARE_PROVIDER_SITE_OTHER): Payer: Medicare Other | Admitting: Family Medicine

## 2020-02-23 ENCOUNTER — Other Ambulatory Visit: Payer: Self-pay

## 2020-02-23 VITALS — BP 112/73 | HR 74 | Temp 97.7°F | Ht 67.0 in | Wt 176.6 lb

## 2020-02-23 DIAGNOSIS — E038 Other specified hypothyroidism: Secondary | ICD-10-CM

## 2020-02-23 DIAGNOSIS — F3181 Bipolar II disorder: Secondary | ICD-10-CM

## 2020-02-23 DIAGNOSIS — R2681 Unsteadiness on feet: Secondary | ICD-10-CM | POA: Diagnosis not present

## 2020-02-23 LAB — CBC WITH DIFFERENTIAL/PLATELET
Absolute Monocytes: 979 cells/uL — ABNORMAL HIGH (ref 200–950)
Basophils Absolute: 62 cells/uL (ref 0–200)
Basophils Relative: 0.7 %
Eosinophils Absolute: 178 cells/uL (ref 15–500)
Eosinophils Relative: 2 %
HCT: 36.6 % (ref 35.0–45.0)
Hemoglobin: 12.7 g/dL (ref 11.7–15.5)
Lymphs Abs: 2661 cells/uL (ref 850–3900)
MCH: 34.2 pg — ABNORMAL HIGH (ref 27.0–33.0)
MCHC: 34.7 g/dL (ref 32.0–36.0)
MCV: 98.7 fL (ref 80.0–100.0)
MPV: 10.1 fL (ref 7.5–12.5)
Monocytes Relative: 11 %
Neutro Abs: 5020 cells/uL (ref 1500–7800)
Neutrophils Relative %: 56.4 %
Platelets: 221 10*3/uL (ref 140–400)
RBC: 3.71 10*6/uL — ABNORMAL LOW (ref 3.80–5.10)
RDW: 11.8 % (ref 11.0–15.0)
Total Lymphocyte: 29.9 %
WBC: 8.9 10*3/uL (ref 3.8–10.8)

## 2020-02-23 MED ORDER — LAMOTRIGINE 25 MG PO TABS: 25.0000 mg | ORAL_TABLET | Freq: Every day | ORAL | 3 refills | Status: DC

## 2020-02-23 NOTE — Addendum Note (Signed)
Addended by: Brandy Hale on: 02/23/2020 03:11 PM   Modules accepted: Orders

## 2020-02-23 NOTE — Assessment & Plan Note (Signed)
Has had complete neurologic work-up with neurology without any significant findings.  Question possible vasovagal syncope though has not had any recurrent symptoms for over a year at this point.  We will continue with watchful waiting.  Recommended exercise as tolerated.

## 2020-02-23 NOTE — Patient Instructions (Signed)
It was very nice to see you today!  We will check blood work today.  Please restart the Lipitor.  Take 25 mg daily for 2 weeks and then increase to 50 mg daily.  Please send me a message in a couple weeks to let me know how you are doing with this.  Take care, Dr Jerline Pain  Please try these tips to maintain a healthy lifestyle:   Eat at least 3 REAL meals and 1-2 snacks per day.  Aim for no more than 5 hours between eating.  If you eat breakfast, please do so within one hour of getting up.    Each meal should contain half fruits/vegetables, one quarter protein, and one quarter carbs (no bigger than a computer mouse)   Cut down on sweet beverages. This includes juice, soda, and sweet tea.     Drink at least 1 glass of water with each meal and aim for at least 8 glasses per day   Exercise at least 150 minutes every week.

## 2020-02-23 NOTE — Assessment & Plan Note (Signed)
Check TSH.  Continue Synthroid 75 mcg daily. 

## 2020-02-23 NOTE — Assessment & Plan Note (Addendum)
She has been under several stressful events lately. She has been off lamictal for a period of time.  We will restart today at low-dose 25 mg daily for 2 weeks and then increase to 50 mg daily.  Is on Prozac 60 mg daily and Seroquel 300 mg daily.  We will continue both these medications.  She will follow-up with me in a couple weeks via MyChart.

## 2020-02-23 NOTE — Progress Notes (Signed)
   BELMA DYCHES is a 80 y.o. female who presents today for an office visit.  Assessment/Plan:  Chronic Problems Addressed Today: Hypothyroidism Check TSH.  Continue Synthroid 75 mcg daily.  Bipolar 2 disorder ([place]) She has been under several stressful events lately. She has been off lamictal for a period of time.  We will restart today at low-dose 25 mg daily for 2 weeks and then increase to 50 mg daily.  Is on Prozac 60 mg daily and Seroquel 300 mg daily.  We will continue both these medications.  She will follow-up with me in a couple weeks via MyChart.  Unsteady gait Has had complete neurologic work-up with neurology without any significant findings.  Question possible vasovagal syncope though has not had any recurrent symptoms for over a year at this point.  We will continue with watchful waiting.  Recommended exercise as tolerated.     Subjective:  HPI:  See a/p.         Objective:  Physical Exam: BP 112/73   Pulse 74   Temp 97.7 F (36.5 C)   Ht 5\' 7"  (1.702 m)   Wt 176 lb 9.6 oz (80.1 kg)   LMP  (LMP Unknown)   SpO2 96%   BMI 27.66 kg/m   Gen: No acute distress, resting comfortably CV: Regular rate and rhythm with no murmurs appreciated Pulm: Normal work of breathing, clear to auscultation bilaterally with no crackles, wheezes, or rhonchi Neuro: Grossly normal, moves all extremities Psych: Normal affect and thought content      Caleb M. Jerline Pain, MD 02/23/2020 2:56 PM

## 2020-02-24 LAB — COMPREHENSIVE METABOLIC PANEL
AG Ratio: 1.8 (calc) (ref 1.0–2.5)
ALT: 28 U/L (ref 6–29)
AST: 31 U/L (ref 10–35)
Albumin: 4.4 g/dL (ref 3.6–5.1)
Alkaline phosphatase (APISO): 45 U/L (ref 37–153)
BUN: 19 mg/dL (ref 7–25)
CO2: 20 mmol/L (ref 20–32)
Calcium: 9.5 mg/dL (ref 8.6–10.4)
Chloride: 104 mmol/L (ref 98–110)
Creat: 0.84 mg/dL (ref 0.60–0.88)
Globulin: 2.4 g/dL (calc) (ref 1.9–3.7)
Glucose, Bld: 90 mg/dL (ref 65–99)
Potassium: 4.3 mmol/L (ref 3.5–5.3)
Sodium: 139 mmol/L (ref 135–146)
Total Bilirubin: 0.4 mg/dL (ref 0.2–1.2)
Total Protein: 6.8 g/dL (ref 6.1–8.1)

## 2020-02-24 LAB — TSH: TSH: 8.31 mIU/L — ABNORMAL HIGH (ref 0.40–4.50)

## 2020-02-26 NOTE — Progress Notes (Signed)
Please inform patient of the following:  Her thyroid is off slightly. Would like for her to come back in a week or two to recheck before we make any dose changes. Please place future order for TSH.  Algis Greenhouse. Jerline Pain, MD 02/26/2020 9:35 AM

## 2020-02-27 ENCOUNTER — Other Ambulatory Visit: Payer: Self-pay | Admitting: *Deleted

## 2020-02-27 DIAGNOSIS — E038 Other specified hypothyroidism: Secondary | ICD-10-CM

## 2020-02-27 NOTE — Progress Notes (Signed)
.  h 

## 2020-02-29 ENCOUNTER — Encounter: Payer: Self-pay | Admitting: Gastroenterology

## 2020-03-04 ENCOUNTER — Other Ambulatory Visit: Payer: Self-pay

## 2020-03-04 ENCOUNTER — Other Ambulatory Visit (INDEPENDENT_AMBULATORY_CARE_PROVIDER_SITE_OTHER): Payer: Medicare Other

## 2020-03-04 DIAGNOSIS — E038 Other specified hypothyroidism: Secondary | ICD-10-CM

## 2020-03-05 LAB — TSH: TSH: 3.73 u[IU]/mL (ref 0.35–4.50)

## 2020-03-05 NOTE — Progress Notes (Signed)
Please inform patient of the following:  Repeat thyroid level is NORMAL. Do not need to make any changes to her treatment plan and we can recheck next time she comes in for an office visit.  Algis Greenhouse. Jerline Pain, MD 03/05/2020 12:56 PM

## 2020-04-12 ENCOUNTER — Other Ambulatory Visit: Payer: Self-pay | Admitting: Family Medicine

## 2020-04-22 ENCOUNTER — Ambulatory Visit: Payer: Medicare Other

## 2020-04-25 ENCOUNTER — Telehealth: Payer: Self-pay | Admitting: Family Medicine

## 2020-04-25 NOTE — Chronic Care Management (AMB) (Signed)
  Chronic Care Management   Note  04/25/2020 Name: Shari Horn MRN: [id number] DOB: 05-Nov-1940  Shari Horn is a 80 y.o. year old female who is a primary care patient of Vivi Barrack, MD. I reached out to [name] by phone today in response to a referral sent by Ms. Sherilyn Banker Denman's PCP, Vivi Barrack, MD.   Ms. Caridi was given information about Chronic Care Management services today including:  1. CCM service includes personalized support from designated clinical staff supervised by her physician, including individualized plan of care and coordination with other care providers 2. 24/7 contact phone numbers for assistance for urgent and routine care needs. 3. Service will only be billed when office clinical staff spend 20 minutes or more in a month to coordinate care. 4. Only one practitioner may furnish and bill the service in a calendar month. 5. The patient may stop CCM services at any time (effective at the end of the month) by phone call to the office staff.   Patient did not agree to enrollment in care management services and does not wish to consider at this time.  Follow up plan:   Lauretta Grill Upstream Scheduler

## 2020-04-29 ENCOUNTER — Other Ambulatory Visit: Payer: Self-pay

## 2020-04-29 ENCOUNTER — Ambulatory Visit (INDEPENDENT_AMBULATORY_CARE_PROVIDER_SITE_OTHER): Payer: Medicare Other

## 2020-04-29 VITALS — BP 120/78 | HR 88 | Temp 98.0°F | Wt 176.2 lb

## 2020-04-29 DIAGNOSIS — Z0000 Encounter for general adult medical examination without abnormal findings: Secondary | ICD-10-CM

## 2020-04-29 NOTE — Patient Instructions (Addendum)
Shari Horn , Thank you for taking time to come for your Medicare Wellness Visit. I appreciate your ongoing commitment to your health goals. Please review the following plan we discussed and let me know if I can assist you in the future.   Screening recommendations/referrals: Colonoscopy: Declined and discussed Mammogram: Declined and discussed Bone Density: Declined and discussed  Recommended yearly ophthalmology/optometry visit for glaucoma screening and checkup Recommended yearly dental visit for hygiene and checkup  Vaccinations: Influenza vaccine: Up to date 12/20/19 Pneumococcal vaccine: Due and discussed Tdap vaccine: Due and idscussed Shingles vaccine: Completed 3/13 & 09/07/17   Covid-19:Completed 1/26, 2/23, & 10/14//21  Advanced directives: Advance directive discussed with you today. I have provided a copy for you to complete at home and have notarized. Once this is complete please bring a copy in to our office so we can scan it into your chart.   Conditions/risks identified: Get back to exercising   Next appointment: Follow up in one year for your annual wellness visit     Preventive Care 81 Years and Older, Female Preventive care refers to lifestyle choices and visits with your health care provider that can promote health and wellness. What does preventive care include?  A yearly physical exam. This is also called an annual well check.  Dental exams once or twice a year.  Routine eye exams. Ask your health care provider how often you should have your eyes checked.  Personal lifestyle choices, including:  Daily care of your teeth and gums.  Regular physical activity.  Eating a healthy diet.  Avoiding tobacco and drug use.  Limiting alcohol use.  Practicing safe sex.  Taking low-dose aspirin every day.  Taking vitamin and mineral supplements as recommended by your health care provider. What happens during an annual well check? The services and screenings  done by your health care provider during your annual well check will depend on your age, overall health, lifestyle risk factors, and family history of disease. Counseling  Your health care provider may ask you questions about your:  Alcohol use.  Tobacco use.  Drug use.  Emotional well-being.  Home and relationship well-being.  Sexual activity.  Eating habits.  History of falls.  Memory and ability to understand (cognition).  Work and work Statistician.  Reproductive health. Screening  You may have the following tests or measurements:  Height, weight, and BMI.  Blood pressure.  Lipid and cholesterol levels. These may be checked every 5 years, or more frequently if you are over 81 years old.  Skin check.  Lung cancer screening. You may have this screening every year starting at age 81 if you have a 30-pack-year history of smoking and currently smoke or have quit within the past 15 years.  Fecal occult blood test (FOBT) of the stool. You may have this test every year starting at age 81.  Flexible sigmoidoscopy or colonoscopy. You may have a sigmoidoscopy every 5 years or a colonoscopy every 10 years starting at age 81.  Hepatitis C blood test.  Hepatitis B blood test.  Sexually transmitted disease (STD) testing.  Diabetes screening. This is done by checking your blood sugar (glucose) after you have not eaten for a while (fasting). You may have this done every 1-3 years.  Bone density scan. This is done to screen for osteoporosis. You may have this done starting at age 81.  Mammogram. This may be done every 1-2 years. Talk to your health care provider about how often you should have  regular mammograms. Talk with your health care provider about your test results, treatment options, and if necessary, the need for more tests. Vaccines  Your health care provider may recommend certain vaccines, such as:  Influenza vaccine. This is recommended every year.  Tetanus,  diphtheria, and acellular pertussis (Tdap, Td) vaccine. You may need a Td booster every 10 years.  Zoster vaccine. You may need this after age 81.  Pneumococcal 13-valent conjugate (PCV13) vaccine. One dose is recommended after age 81.  Pneumococcal polysaccharide (PPSV23) vaccine. One dose is recommended after age 81. Talk to your health care provider about which screenings and vaccines you need and how often you need them. This information is not intended to replace advice given to you by your health care provider. Make sure you discuss any questions you have with your health care provider. Document Released: 02/01/2015 Document Revised: 09/25/2015 Document Reviewed: 11/06/2014 Elsevier Interactive Patient Education  2017 [place] Prevention in the Home Falls can cause injuries. They can happen to people of all ages. There are many things you can do to make your home safe and to help prevent falls. What can I do on the outside of my home?  Regularly fix the edges of walkways and driveways and fix any cracks.  Remove anything that might make you trip as you walk through a door, such as a raised step or threshold.  Trim any bushes or trees on the path to your home.  Use bright outdoor lighting.  Clear any walking paths of anything that might make someone trip, such as rocks or tools.  Regularly check to see if handrails are loose or broken. Make sure that both sides of any steps have handrails.  Any raised decks and porches should have guardrails on the edges.  Have any leaves, snow, or ice cleared regularly.  Use sand or salt on walking paths during winter.  Clean up any spills in your garage right away. This includes oil or grease spills. What can I do in the bathroom?  Use night lights.  Install grab bars by the toilet and in the tub and shower. Do not use towel bars as grab bars.  Use non-skid mats or decals in the tub or shower.  If you need to sit down in  the shower, use a plastic, non-slip stool.  Keep the floor dry. Clean up any water that spills on the floor as soon as it happens.  Remove soap buildup in the tub or shower regularly.  Attach bath mats securely with double-sided non-slip rug tape.  Do not have throw rugs and other things on the floor that can make you trip. What can I do in the bedroom?  Use night lights.  Make sure that you have a light by your bed that is easy to reach.  Do not use any sheets or blankets that are too big for your bed. They should not hang down onto the floor.  Have a firm chair that has side arms. You can use this for support while you get dressed.  Do not have throw rugs and other things on the floor that can make you trip. What can I do in the kitchen?  Clean up any spills right away.  Avoid walking on wet floors.  Keep items that you use a lot in easy-to-reach places.  If you need to reach something above you, use a strong step stool that has a grab bar.  Keep electrical cords out of the  way.  Do not use floor polish or wax that makes floors slippery. If you must use wax, use non-skid floor wax.  Do not have throw rugs and other things on the floor that can make you trip. What can I do with my stairs?  Do not leave any items on the stairs.  Make sure that there are handrails on both sides of the stairs and use them. Fix handrails that are broken or loose. Make sure that handrails are as long as the stairways.  Check any carpeting to make sure that it is firmly attached to the stairs. Fix any carpet that is loose or worn.  Avoid having throw rugs at the top or bottom of the stairs. If you do have throw rugs, attach them to the floor with carpet tape.  Make sure that you have a light switch at the top of the stairs and the bottom of the stairs. If you do not have them, ask someone to add them for you. What else can I do to help prevent falls?  Wear shoes that:  Do not have high  heels.  Have rubber bottoms.  Are comfortable and fit you well.  Are closed at the toe. Do not wear sandals.  If you use a stepladder:  Make sure that it is fully opened. Do not climb a closed stepladder.  Make sure that both sides of the stepladder are locked into place.  Ask someone to hold it for you, if possible.  Clearly mark and make sure that you can see:  Any grab bars or handrails.  First and last steps.  Where the edge of each step is.  Use tools that help you move around (mobility aids) if they are needed. These include:  Canes.  Walkers.  Scooters.  Crutches.  Turn on the lights when you go into a dark area. Replace any light bulbs as soon as they burn out.  Set up your furniture so you have a clear path. Avoid moving your furniture around.  If any of your floors are uneven, fix them.  If there are any pets around you, be aware of where they are.  Review your medicines with your doctor. Some medicines can make you feel dizzy. This can increase your chance of falling. Ask your doctor what other things that you can do to help prevent falls. This information is not intended to replace advice given to you by your health care provider. Make sure you discuss any questions you have with your health care provider. Document Released: 11/01/2008 Document Revised: 06/13/2015 Document Reviewed: 02/09/2014 Elsevier Interactive Patient Education  2017 [employer].

## 2020-04-29 NOTE — Progress Notes (Signed)
Subjective:   Shari Horn is a 80 y.o. female who presents for an Initial Medicare Annual Wellness Visit.  Review of Systems     Cardiac Risk Factors include: advanced age (>68men, >14 women);dyslipidemia     Objective:    Today's Vitals   04/29/20 1055  BP: 120/78  Pulse: 88  Temp: 98 F (36.7 C)  SpO2: 92%  Weight: 176 lb 3.2 oz (79.9 kg)   Body mass index is 27.6 kg/m.  Advanced Directives 04/29/2020 02/13/2020 07/31/2019 10/18/2018 12/11/2017 03/26/2017 01/10/2014  Does Patient Have a Medical Advance Directive? Yes (No Data) Yes No No No No  Does patient want to make changes to medical advance directive? Yes (MAU/Ambulatory/Procedural Areas - Information given) - - - - - -  Would patient like information on creating a medical advance directive? - - - No - Guardian declined No - Patient declined Yes (MAU/Ambulatory/Procedural Areas - Information given) No - patient declined information    Current Medications (verified) Outpatient Encounter Medications as of 04/29/2020  Medication Sig  . cholecalciferol (VITAMIN D3) 25 MCG (1000 UT) tablet Take 2,000 Units by mouth daily.  . clonazePAM (KLONOPIN) 0.5 MG tablet TAKE 1 TABLET BY MOUTH THREE TIMES DAILY  . ezetimibe (ZETIA) 10 MG tablet Take 1 tablet (10 mg total) by mouth daily.  Marland Kitchen FLUoxetine (PROZAC) 20 MG capsule TAKE 3 CAPSULES(60 MG) BY MOUTH DAILY  . lamoTRIgine (LAMICTAL) 25 MG tablet Take 1 tablet (25 mg total) by mouth daily.  Marland Kitchen levothyroxine (SYNTHROID) 75 MCG tablet TAKE 1 TABLET(75 MCG) BY MOUTH DAILY  . meclizine (ANTIVERT) 25 MG tablet Take 1 tablet (25 mg total) by mouth 3 (three) times daily as needed for dizziness.  . Multiple Vitamin (MULTIVITAMIN WITH MINERALS) TABS tablet Take 1 tablet by mouth daily.  . pantoprazole (PROTONIX) 40 MG tablet TAKE 1 TABLET(40 MG) BY MOUTH DAILY  . QUEtiapine (SEROQUEL) 300 MG tablet Take 1 tablet (300 mg total) by mouth at bedtime.  . rosuvastatin (CRESTOR) 40 MG tablet TAKE 1  TABLET BY MOUTH DAILY  . senna (SENOKOT) 8.6 MG TABS tablet Take 5 tablets by mouth at bedtime.   . prednisoLONE acetate (PRED FORTE) 1 % ophthalmic suspension SMARTSIG:In Eye(s) (Patient not taking: Reported on 04/29/2020)   No facility-administered encounter medications on file as of 04/29/2020.    Allergies (verified) Codeine   History: Past Medical History:  Diagnosis Date  . Carotid artery disease ([place])   . Depression   . Dyslipidemia   . Hyperlipidemia   . Hypothyroidism   . 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 PVC's (premature ventricular contractions)    Past Surgical History:  Procedure Laterality Date  . BREAST BIOPSY    . BREAST EXCISIONAL BIOPSY Left 1990  . BREAST EXCISIONAL BIOPSY Left 1996  . Carotid Doppler  12/2011   0-49% RICA stenosis, 02-72% LICA stenosis  . 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 TRANSTHORACIC ECHOCARDIOGRAM  12/2011   EF 53-66%, grade 1 diastolic dysfunction; trivial AV regurg; calcified MV annulus   Family History  Problem Relation Age of Onset  . Heart failure Mother   . Thyroid disease Mother   . Hypertension Mother   . Diabetes Mother   . Arthritis/Rheumatoid Father   . Heart Problems Maternal Grandmother   . Heart disease Brother  valve replacement  . Heart disease Child   . Colon cancer Neg Hx   . Esophageal cancer Neg Hx   . Stomach cancer Neg Hx    Social History   Socioeconomic History  . Marital status: Divorced    Spouse name: Not on file  . Number of children: 2  . Years of education: master's  . Highest education level: Not on file  Occupational History    Comment: retired   Tobacco Use  . Smoking status: Former Smoker    Years: 10.00    Types: Cigarettes    Quit date: 12/19/1972    Years since quitting: 47.3  . Smokeless tobacco: Never Used  Vaping Use  .  Vaping Use: Never used  Substance and Sexual Activity  . Alcohol use: No    Comment: quit in 1973  . Drug use: No  . Sexual activity: Not on file  Other Topics Concern  . Not on file  Social History Narrative   Right handed   Two story home   Drinks caffeine   Social Determinants of Health   Financial Resource Strain: Low Risk   . Difficulty of Paying Living Expenses: Not hard at all  Food Insecurity: No Food Insecurity  . Worried About Charity fundraiser in the Last Year: Never true  . Ran Out of Food in the Last Year: Never true  Transportation Needs: No Transportation Needs  . Lack of Transportation (Medical): No  . Lack of Transportation (Non-Medical): No  Physical Activity: Inactive  . Days of Exercise per Week: 0 days  . Minutes of Exercise per Session: 0 min  Stress: No Stress Concern Present  . Feeling of Stress : Only a little  Social Connections: Socially Isolated  . Frequency of Communication with Friends and Family: More than three times a week  . Frequency of Social Gatherings with Friends and Family: More than three times a week  . Attends Religious Services: Never  . Active Member of Clubs or Organizations: No  . Attends Archivist Meetings: Never  . Marital Status: Divorced    Tobacco Counseling Counseling given: Not Answered   Clinical Intake:  Pre-visit preparation completed: Yes  Pain : No/denies pain     BMI - recorded: 27.6 Nutritional Status: BMI 25 -29 Overweight Nutritional Risks: None Diabetes: No  How often do you need to have someone help you when you read instructions, pamphlets, or other written materials from your doctor or pharmacy?: 1 - Never  Diabetic?No  Interpreter Needed?: No  Information entered by :: Shari Rakes, LPN   Activities of Daily Living In your present state of health, do you have any difficulty performing the following activities: 04/29/2020 02/23/2020  Hearing? Shari Horn  Comment wears hearing aids  -  Vision? N Y  Difficulty concentrating or making decisions? Y Y  Comment at times -  Walking or climbing stairs? Y Y  Comment take time Due to balance  Dressing or bathing? N N  Doing errands, shopping? N N  Preparing Food and eating ? N -  Managing your Medications? N -  Managing your Finances? N -  Housekeeping or managing your Housekeeping? N -  Some recent data might be hidden    Patient Care Team: Vivi Barrack, MD as PCP - General (Family Medicine) Debara Pickett Nadean Corwin, MD as PCP - Cardiology (Cardiology) Cameron Sprang, MD as Consulting Physician (Neurology)  Indicate any recent Medical Services you may have received from  other than [facility] providers in the past year (date may be approximate).     Assessment:   This is a routine wellness examination for Yailen.  Hearing/Vision screen  Hearing Screening   125Hz  250Hz  500Hz  1000Hz  2000Hz  3000Hz  4000Hz  6000Hz  8000Hz   Right ear:           Left ear:           Comments: Wears hearing aid  Vision Screening Comments: Pt follows up with eye dr for annual exams   Dietary issues and exercise activities discussed: Current Exercise Habits: The patient does not participate in regular exercise at present  Goals    . Patient Stated     Start exercising again      Depression Screen PHQ 2/9 Scores 04/29/2020 09/06/2018 09/14/2016  PHQ - 2 Score 1 0 2  PHQ- 9 Score - - 10    Fall Risk Fall Risk  04/29/2020 02/23/2020 02/13/2020 07/31/2019 09/14/2016  Falls in the past year? 0 0 1 1 Yes  Number falls in past yr: 0 0 0 0 2 or more  Injury with Fall? 0 - 0 1 -  Risk Factor Category  - - - - High Fall Risk  Risk for fall due to : Impaired balance/gait;Impaired mobility;Impaired vision;History of fall(s) - - - History of fall(s)  Follow up Falls prevention discussed - - - Falls evaluation completed    FALL RISK PREVENTION PERTAINING TO THE HOME:  Any stairs in or around the home? Yes  If so, are there any without handrails? No  Home  free of loose throw rugs in walkways, pet beds, electrical cords, etc? Yes  Adequate lighting in your home to reduce risk of falls? Yes   ASSISTIVE DEVICES UTILIZED TO PREVENT FALLS:  Life alert? No  Use of a cane, walker or w/c? Yes  Grab bars in the bathroom? Yes  Shower chair or bench in shower? Yes  Elevated toilet seat or a handicapped toilet? Yes   TIMED UP AND GO:  Was the test performed? Yes .  Length of time to ambulate 10 feet: 10 sec.   Gait steady and fast without use of assistive device  Cognitive Function: declined 6CIT   Montreal Cognitive Assessment  09/18/2019  Visuospatial/ Executive (0/5) 4  Naming (0/3) 3  Attention: Read list of digits (0/2) 1  Attention: Read list of letters (0/1) 1  Attention: Serial 7 subtraction starting at 100 (0/3) 3  Language: Repeat phrase (0/2) 1  Language : Fluency (0/1) 1  Abstraction (0/2) 2  Delayed Recall (0/5) 3  Orientation (0/6) 6  Total 25  Adjusted Score (based on education) 25      Immunizations Immunization History  Administered Date(s) Administered  . Fluad Quad(high Dose 65+) 10/22/2018  . Influenza, High Dose Seasonal PF 12/15/2016, 11/15/2017  . Influenza-Unspecified 12/20/2019  . PFIZER(Purple Top)SARS-COV-2 Vaccination 02/14/2019, 03/14/2019, 11/02/2019  . Pneumococcal Polysaccharide-23 12/15/2016  . Zoster Recombinat (Shingrix) 03/31/2017, 09/07/2017    TDAP status: Due, Education has been provided regarding the importance of this vaccine. Advised may receive this vaccine at local pharmacy or Health Dept. Aware to provide a copy of the vaccination record if obtained from local pharmacy or Health Dept. Verbalized acceptance and understanding.  Flu Vaccine status: Up to date  Pneumococcal vaccine status: Due, Education has been provided regarding the importance of this vaccine. Advised may receive this vaccine at local pharmacy or Health Dept. Aware to provide a copy of the vaccination record if obtained  from local pharmacy or Health Dept. Verbalized acceptance and understanding.  Covid-19 vaccine status: Completed vaccines  Qualifies for Shingles Vaccine? Yes   Zostavax completed Yes   Shingrix Completed?: Yes  Screening Tests Health Maintenance  Topic Date Due  . TETANUS/TDAP  Never done  . PNA vac Low Risk Adult (2 of 2 - PCV13) 12/15/2017  . DEXA SCAN  04/29/2021 (Originally 03/13/2020)  . COLONOSCOPY (Pts 45-22yrs Insurance coverage will need to be confirmed)  04/29/2021 (Originally 11/14/2019)  . INFLUENZA VACCINE  08/19/2020  . COVID-19 Vaccine  Completed  . HPV VACCINES  Aged Out    Health Maintenance  Health Maintenance Due  Topic Date Due  . TETANUS/TDAP  Never done  . PNA vac Low Risk Adult (2 of 2 - PCV13) 12/15/2017       Mammogram status: No longer required due to per pt .  Bone density declined at this time  Additional Screening:   Vision Screening: Recommended annual ophthalmology exams for early detection of glaucoma and other disorders of the eye. Is the patient up to date with their annual eye exam?  Yes  Who is the provider or what is the name of the office in which the patient attends annual eye exams? Dr Claudean Kinds  If pt is not established with a provider, would they like to be referred to a provider to establish care? No .   Dental Screening: Recommended annual dental exams for proper oral hygiene  Community Resource Referral / Chronic Care Management: CRR required this visit?  No   CCM required this visit?  No      Plan:     I have personally reviewed and noted the following in the patient's chart:   . Medical and social history . Use of alcohol, tobacco or illicit drugs  . Current medications and supplements . Functional ability and status . Nutritional status . Physical activity . Advanced directives . List of other physicians . Hospitalizations, surgeries, and ER visits in previous 12 months . Vitals . Screenings to include  cognitive, depression, and falls . Referrals and appointments  In addition, I have reviewed and discussed with patient certain preventive protocols, quality metrics, and best practice recommendations. A written personalized care plan for preventive services as well as general preventive health recommendations were provided to patient.     Willette Brace, LPN   [id number]/61/[id number]   Nurse Notes: None

## 2020-05-07 ENCOUNTER — Other Ambulatory Visit: Payer: Self-pay | Admitting: Family Medicine

## 2020-06-11 ENCOUNTER — Other Ambulatory Visit: Payer: Self-pay | Admitting: Family Medicine

## 2020-06-11 DIAGNOSIS — F3181 Bipolar II disorder: Secondary | ICD-10-CM

## 2020-06-13 ENCOUNTER — Encounter: Payer: Self-pay | Admitting: Physician Assistant

## 2020-06-13 ENCOUNTER — Ambulatory Visit: Payer: Medicare Other | Admitting: Physician Assistant

## 2020-06-13 ENCOUNTER — Other Ambulatory Visit: Payer: Self-pay

## 2020-06-13 DIAGNOSIS — Z1283 Encounter for screening for malignant neoplasm of skin: Secondary | ICD-10-CM

## 2020-06-13 DIAGNOSIS — L309 Dermatitis, unspecified: Secondary | ICD-10-CM

## 2020-06-13 DIAGNOSIS — L821 Other seborrheic keratosis: Secondary | ICD-10-CM | POA: Diagnosis not present

## 2020-06-13 MED ORDER — KETOCONAZOLE 2 % EX CREA: 1.0000 "application " | TOPICAL_CREAM | Freq: Two times a day (BID) | CUTANEOUS | 10 refills | Status: AC

## 2020-06-13 MED ORDER — FLUOCINOLONE ACETONIDE BODY 0.01 % EX OIL
TOPICAL_OIL | CUTANEOUS | 5 refills | Status: DC
Start: 2020-06-13 — End: 2022-01-30

## 2020-06-13 NOTE — Progress Notes (Signed)
   Follow-Up Visit   Subjective  Shari Horn is a 80 y.o. female who presents for the following: Annual Exam (Lesion on left upper arm x years- gets itchy, left lower leg new lesion x years- "changed" & both ears get itchy on the inside. No personal or family history of melanoma or non mole skin cancerns. ).   The following portions of the chart were reviewed this encounter and updated as appropriate:  Tobacco  Allergies  Meds  Problems  Med Hx  Surg Hx  Fam Hx      Objective  Well appearing patient in no apparent distress; mood and affect are within normal limits.  All skin waist up examined.  Objective  head to toe: No atypical nevi No signs of non-mole skin cancer.   Objective  Left Lower Leg - Anterior, Left Upper Back: Madkins and white crusty plaques.  Objective  both ears, at the base of the opening to to the ear canal: Red and scaly  Assessment & Plan  Encounter for screening for malignant neoplasm of skin head to toe  Observe. Yearly skin exams.  Seborrheic keratosis (2) Left Lower Leg - Anterior; Left Upper Back  Observe. RTC if changes.  Dermatitis both ears, at the base of the opening to to the ear canal  ketoconazole (NIZORAL) 2 % cream - both ears, at the base of the opening to to the ear canal  Fluocinolone Acetonide Body 0.01 % OIL - both ears, at the base of the opening to to the ear canal   I, Semaj Kham, PA-C, have reviewed all documentation's for this visit.  The documentation on 06/13/20 for the exam, diagnosis, procedures and orders are all accurate and complete.

## 2020-06-20 ENCOUNTER — Other Ambulatory Visit: Payer: Self-pay | Admitting: Family Medicine

## 2020-06-20 DIAGNOSIS — F3181 Bipolar II disorder: Secondary | ICD-10-CM

## 2020-06-21 ENCOUNTER — Telehealth: Payer: Self-pay

## 2020-06-21 NOTE — Telephone Encounter (Signed)
Spoke with patient pharmacy stated it was to soon a week ago they gave a partial,she will call pharmacy to see if she is able to pick up Rx ,pt will call clinic if any issues.

## 2020-06-21 NOTE — Telephone Encounter (Signed)
  LAST APPOINTMENT DATE: 02/23/2020   NEXT APPOINTMENT DATE:@Visit  date not found  MEDICATION:QUEtiapine (SEROQUEL) 300 MG tablet  PHARMACY:[id number] - [place],  - [address] AT [place] [address] [place]  Comments: Patient is completely out.   Please advise

## 2020-06-23 ENCOUNTER — Other Ambulatory Visit: Payer: Self-pay | Admitting: Family Medicine

## 2020-06-23 DIAGNOSIS — F3181 Bipolar II disorder: Secondary | ICD-10-CM

## 2020-06-24 NOTE — Telephone Encounter (Signed)
Gave verbal order to pharmacy to refill ok to pick up

## 2020-06-24 NOTE — Telephone Encounter (Signed)
Noted. She should check with pharmacy again. This is not a controlled substance.  Algis Greenhouse. Jerline Pain, MD 06/24/2020 9:45 AM

## 2020-06-24 NOTE — Telephone Encounter (Signed)
Rx Request 

## 2020-06-24 NOTE — Telephone Encounter (Signed)
Patient notified

## 2020-07-11 ENCOUNTER — Other Ambulatory Visit: Payer: Self-pay | Admitting: Family Medicine

## 2020-07-11 ENCOUNTER — Other Ambulatory Visit: Payer: Self-pay | Admitting: Internal Medicine

## 2020-07-11 DIAGNOSIS — F3181 Bipolar II disorder: Secondary | ICD-10-CM

## 2020-07-15 ENCOUNTER — Other Ambulatory Visit: Payer: Self-pay | Admitting: Internal Medicine

## 2020-08-05 ENCOUNTER — Other Ambulatory Visit: Payer: Self-pay | Admitting: Internal Medicine

## 2020-08-05 DIAGNOSIS — I6523 Occlusion and stenosis of bilateral carotid arteries: Secondary | ICD-10-CM

## 2020-08-07 ENCOUNTER — Ambulatory Visit (HOSPITAL_COMMUNITY)
Admission: RE | Admit: 2020-08-07 | Discharge: 2020-08-07 | Disposition: A | Discharge status: Home / Self Care | Payer: Medicare Other | Source: Ambulatory Visit | Attending: Cardiology | Admitting: Cardiology

## 2020-08-07 ENCOUNTER — Other Ambulatory Visit: Payer: Self-pay

## 2020-08-07 ENCOUNTER — Other Ambulatory Visit (HOSPITAL_COMMUNITY): Payer: Self-pay | Admitting: Internal Medicine

## 2020-08-07 DIAGNOSIS — I6523 Occlusion and stenosis of bilateral carotid arteries: Secondary | ICD-10-CM | POA: Diagnosis not present

## 2020-08-07 DIAGNOSIS — I779 Disorder of arteries and arterioles, unspecified: Secondary | ICD-10-CM

## 2020-08-10 ENCOUNTER — Other Ambulatory Visit: Payer: Self-pay | Admitting: Internal Medicine

## 2020-08-12 ENCOUNTER — Other Ambulatory Visit: Payer: Self-pay | Admitting: Internal Medicine

## 2020-08-13 ENCOUNTER — Other Ambulatory Visit: Payer: Self-pay | Admitting: Internal Medicine

## 2020-08-27 ENCOUNTER — Other Ambulatory Visit: Payer: Self-pay

## 2020-08-27 ENCOUNTER — Encounter: Payer: Self-pay | Admitting: Family Medicine

## 2020-08-27 ENCOUNTER — Ambulatory Visit (INDEPENDENT_AMBULATORY_CARE_PROVIDER_SITE_OTHER): Payer: Medicare Other | Admitting: Family Medicine

## 2020-08-27 VITALS — BP 122/68 | HR 71 | Temp 98.4°F | Ht 67.0 in | Wt 178.4 lb

## 2020-08-27 DIAGNOSIS — R5383 Other fatigue: Secondary | ICD-10-CM | POA: Diagnosis not present

## 2020-08-27 DIAGNOSIS — E038 Other specified hypothyroidism: Secondary | ICD-10-CM | POA: Diagnosis not present

## 2020-08-27 DIAGNOSIS — R2681 Unsteadiness on feet: Secondary | ICD-10-CM | POA: Diagnosis not present

## 2020-08-27 DIAGNOSIS — H9201 Otalgia, right ear: Secondary | ICD-10-CM

## 2020-08-27 MED ORDER — AZELASTINE HCL 0.1 % NA SOLN: 2.0000 | Freq: Two times a day (BID) | NASAL | 12 refills | Status: DC

## 2020-08-27 MED ORDER — AMOXICILLIN-POT CLAVULANATE 875-125 MG PO TABS: 1.0000 | ORAL_TABLET | Freq: Two times a day (BID) | ORAL | 0 refills | Status: DC

## 2020-08-27 NOTE — Assessment & Plan Note (Signed)
No recent falls.  Neurologic work-up has been negative.  She will be following up with cardiology soon.

## 2020-08-27 NOTE — Progress Notes (Signed)
   Shari Horn is a 80 y.o. female who presents today for an office visit.  Assessment/Plan:  New/Acute Problems: Headache/fatigue She does have some evidence of eustachian tube dysfunction on exam today.  She also had a cerumen impaction that was successfully irrigated by RMA.  May have underlying sinusitis which could explain her headache, fatigue, and earache.  We will start with a course of Astelin.  Also send in prescription for Augmentin with instruction start if symptoms or not improving over the next couple of days.  If she has continued issues despite this we will check labs including CBC, c-Met, TSH, and UA/urine culture.  Chronic Problems Addressed Today: Hypothyroidism Last TSH at goal.  Doubt this is contributing to her fatigue.  We will continue Synthroid 75 mcg daily.  Need to check TSH if continues to have fatigue.  Unsteady gait No recent falls.  Neurologic work-up has been negative.  She will be following up with cardiology soon.     Subjective:  HPI:  Patient with fatigue for the last few months.  No obvious precipitating events.  She has had some associated right earache, chills, nausea, and headaches.  Had low-grade fevers.  She did have 1 episode of diarrhea however nothing since then.  No hematuria.  No melena or hematochezia.  No dysuria.        Objective:  Physical Exam: BP 122/68   Pulse 71   Temp 98.4 F (36.9 C) (Temporal)   Ht $R'5\' 7"'cs$  (1.702 m)   Wt 178 lb 6.4 oz (80.9 kg)   LMP  (LMP Unknown)   SpO2 98%   BMI 27.94 kg/m   Gen: No acute distress, resting comfortably HEENT: Bilateral TMs with clear effusion.  Slight erythema. CV: Regular rate and rhythm with no murmurs appreciated Pulm: Normal work of breathing, clear to auscultation bilaterally with no crackles, wheezes, or rhonchi Neuro: Grossly normal, moves all extremities Psych: Normal affect and thought content       I,Savera Zaman,acting as a scribe for Dimas Chyle, MD.,have documented  all relevant documentation on the behalf of Dimas Chyle, MD,as directed by  Dimas Chyle, MD while in the presence of Dimas Chyle, MD.  Preventative Healthcare Patient was instructed to return soon for CPE. Health Maintenance Due  Topic Date Due   TETANUS/TDAP  Never done   PNA vac Low Risk Adult (2 of 2 - PCV13) 12/15/2017   COVID-19 Vaccine (4 - Booster for [place] series) 02/02/2020   INFLUENZA VACCINE  08/19/2020     Sherah Lund M. Jerline Pain, MD 08/27/2020 3:38 PM

## 2020-08-27 NOTE — Patient Instructions (Signed)
It was very nice to see you today!  Please flush out your ears today.  I believe you may have a sinus infection.  Please start the nasal spray and antibiotic.  Let us know if not improving over the next few weeks.  Take care, Dr Jerline Pain  PLEASE NOTE:  If you had any lab tests please let us know if you have not heard back within a few days. You may see your results on mychart before we have a chance to review them but we will give you a call once they are reviewed by [place]. If we ordered any referrals today, please let us know if you have not heard from their office within the next week.   Please try these tips to maintain a healthy lifestyle:  Eat at least 3 REAL meals and 1-2 snacks per day.  Aim for no more than 5 hours between eating.  If you eat breakfast, please do so within one hour of getting up.   Each meal should contain half fruits/vegetables, one quarter protein, and one quarter carbs (no bigger than a computer mouse)  Cut down on sweet beverages. This includes juice, soda, and sweet tea.   Drink at least 1 glass of water with each meal and aim for at least 8 glasses per day  Exercise at least 150 minutes every week.

## 2020-08-27 NOTE — Assessment & Plan Note (Signed)
Last TSH at goal.  Doubt this is contributing to her fatigue.  We will continue Synthroid 75 mcg daily.  Need to check TSH if continues to have fatigue.

## 2020-09-16 ENCOUNTER — Other Ambulatory Visit: Payer: Self-pay | Admitting: Internal Medicine

## 2020-09-16 ENCOUNTER — Other Ambulatory Visit: Payer: Self-pay | Admitting: Family Medicine

## 2020-09-17 ENCOUNTER — Ambulatory Visit: Payer: Medicare Other | Admitting: Medical

## 2020-09-27 ENCOUNTER — Other Ambulatory Visit: Payer: Self-pay | Admitting: Family Medicine

## 2020-10-09 ENCOUNTER — Other Ambulatory Visit: Payer: Self-pay | Admitting: Family Medicine

## 2020-10-09 ENCOUNTER — Other Ambulatory Visit: Payer: Self-pay | Admitting: Internal Medicine

## 2020-10-09 DIAGNOSIS — F3181 Bipolar II disorder: Secondary | ICD-10-CM

## 2020-10-15 ENCOUNTER — Ambulatory Visit (HOSPITAL_COMMUNITY): Payer: Medicare Other

## 2020-10-16 ENCOUNTER — Telehealth: Payer: Self-pay | Admitting: Internal Medicine

## 2020-10-16 ENCOUNTER — Ambulatory Visit: Payer: Medicare Other | Admitting: Neurology

## 2020-10-16 NOTE — Telephone Encounter (Signed)
Patient of Dr. Debara Pickett walked into [facility] office 10/15/20. She was under the impression she had an OV with MD, but instead was incorrectly scheduled for carotid doppler (which is not needed until 07/2021, was cancelled and message left, but patient did not get info on this -- see below).   Made On: Change Notes: Canceled: 10/11/2020 4:42 PM 10/14/2020 11:05 AM 10/14/2020 11:06 AM By: By: By: Jerre Simon, Baxter Flattery, FALECHA L  Cancel Rsn: Provider (LVM PATIENT NOT DUE TILL 07/2021)   This writer was offsite at another office, but patient left a message thru the front desk rep at [place] and it was placed in MD mail box.   Note said that patient has had complete testing w/Dr. Delice Lesch for black outs, no neuro reason for blackouts per this MD (per patient), needs to see Dr. Debara Pickett.   Patient understandably upset with appointment mix up  Left message for patient to call back

## 2020-10-22 ENCOUNTER — Encounter: Payer: Self-pay | Admitting: Family Medicine

## 2020-10-22 ENCOUNTER — Other Ambulatory Visit: Payer: Self-pay | Admitting: Internal Medicine

## 2020-10-22 ENCOUNTER — Ambulatory Visit (INDEPENDENT_AMBULATORY_CARE_PROVIDER_SITE_OTHER): Payer: Medicare Other | Admitting: Family Medicine

## 2020-10-22 ENCOUNTER — Other Ambulatory Visit: Payer: Self-pay

## 2020-10-22 VITALS — BP 135/76 | HR 71 | Temp 97.7°F | Ht 67.0 in | Wt 180.0 lb

## 2020-10-22 DIAGNOSIS — Z23 Encounter for immunization: Secondary | ICD-10-CM

## 2020-10-22 DIAGNOSIS — G8929 Other chronic pain: Secondary | ICD-10-CM | POA: Diagnosis not present

## 2020-10-22 DIAGNOSIS — R2681 Unsteadiness on feet: Secondary | ICD-10-CM

## 2020-10-22 DIAGNOSIS — M549 Dorsalgia, unspecified: Secondary | ICD-10-CM

## 2020-10-22 MED ORDER — CIPROFLOXACIN-DEXAMETHASONE 0.3-0.1 % OT SUSP: 4.0000 [drp] | Freq: Two times a day (BID) | OTIC | 0 refills | Status: DC

## 2020-10-22 NOTE — Patient Instructions (Signed)
It was very nice to see you today!  Please restart the Astelin nasal spray.  Please start the Ciprodex drops.  Let me know if your symptoms or not improving.  We will put in a referral for you to see a massage therapist.  Take care, Dr Jerline Pain  PLEASE NOTE:  If you had any lab tests please let us know if you have not heard back within a few days. You may see your results on mychart before we have a chance to review them but we will give you a call once they are reviewed by [place]. If we ordered any referrals today, please let us know if you have not heard from their office within the next week.   Please try these tips to maintain a healthy lifestyle:  Eat at least 3 REAL meals and 1-2 snacks per day.  Aim for no more than 5 hours between eating.  If you eat breakfast, please do so within one hour of getting up.   Each meal should contain half fruits/vegetables, one quarter protein, and one quarter carbs (no bigger than a computer mouse)  Cut down on sweet beverages. This includes juice, soda, and sweet tea.   Drink at least 1 glass of water with each meal and aim for at least 8 glasses per day  Exercise at least 150 minutes every week.

## 2020-10-22 NOTE — Progress Notes (Signed)
   Shari Horn is a 80 y.o. female who presents today for an office visit.  Assessment/Plan:  New/Acute Problems: Otalgia Small amount of irritation in bilateral EACs.  We will start Ciprodex drops.  She will also restart Astelin.  No signs of otitis media today.  If symptoms persist will need follow-up with ENT.  Chronic Problems Addressed Today: Chronic back pain No red flags. Will have her follow up with massage therapy.   Unsteady gait Neurologic work-up negative.  She has also followed with cardiology.  She will let me know if she has any recurrence of episodes.     Subjective:  HPI:  She is here to follow up on ear ache. She was last seen in the office on 08/27/2020.  70 paxlovid time.  Had ear lavage.  Also started on Astelin and Augmentin.  Symptoms improved for several weeks. She reports there is some drainage and it came back few weeks ago.  Associated symptoms are sore throat and swollen glands. She reports left ear is doing well. Hearing is normal.   She has also had some issues with unsteady gait.  Has recently had Neurologic work up done. Her Neurologist reports falling was due to syncopal episodes. She had one episode of blackout few years ago.  She has not had any issues since then.  Patient is also having muscle tightness and pain in her upper mid back. She did not tried any physical  or massage therapy for this issue. She is willing to go to massage therapist.        Objective:  Physical Exam: BP 135/76   Pulse 71   Temp 97.7 F (36.5 C) (Temporal)   Ht 5\' 7"  (1.702 m)   Wt 180 lb (81.6 kg)   LMP  (LMP Unknown)   SpO2 98%   BMI 28.19 kg/m   Gen: No acute distress, resting comfortably HEENT: EACs with irritation bilaterally.  TMs clear. CV: Regular rate and rhythm with no murmurs appreciated Pulm: Normal work of breathing, clear to auscultation bilaterally with no crackles, wheezes, or rhonchi Neuro: Grossly normal, moves all extremities Psych: Normal  affect and thought content       I,Savera Zaman,acting as a scribe for Dimas Chyle, MD.,have documented all relevant documentation on the behalf of Dimas Chyle, MD,as directed by  Dimas Chyle, MD while in the presence of Dimas Chyle, MD.   I, Dimas Chyle, MD, have reviewed all documentation for this visit. The documentation on 10/22/20 for the exam, diagnosis, procedures, and orders are all accurate and complete.  Algis Greenhouse. Jerline Pain, MD 10/22/2020 1:25 PM

## 2020-10-22 NOTE — Assessment & Plan Note (Signed)
No red flags. Will have her follow up with massage therapy.

## 2020-10-22 NOTE — Assessment & Plan Note (Signed)
Neurologic work-up negative.  She has also followed with cardiology.  She will let me know if she has any recurrence of episodes.

## 2020-10-23 ENCOUNTER — Other Ambulatory Visit: Payer: Self-pay | Admitting: Internal Medicine

## 2020-10-27 IMAGING — DX DG ABDOMEN 1V
1 series · 1 of 1 positions shown · non-contrast
Comparison: 03/19/2017 right hip films.  09/09/2009 CT.

CLINICAL DATA: 76-year-old female with right lower quadrant pain
for past 2 and half months. Initial encounter.

EXAM:
ABDOMEN - 1 VIEW

[kub ap]
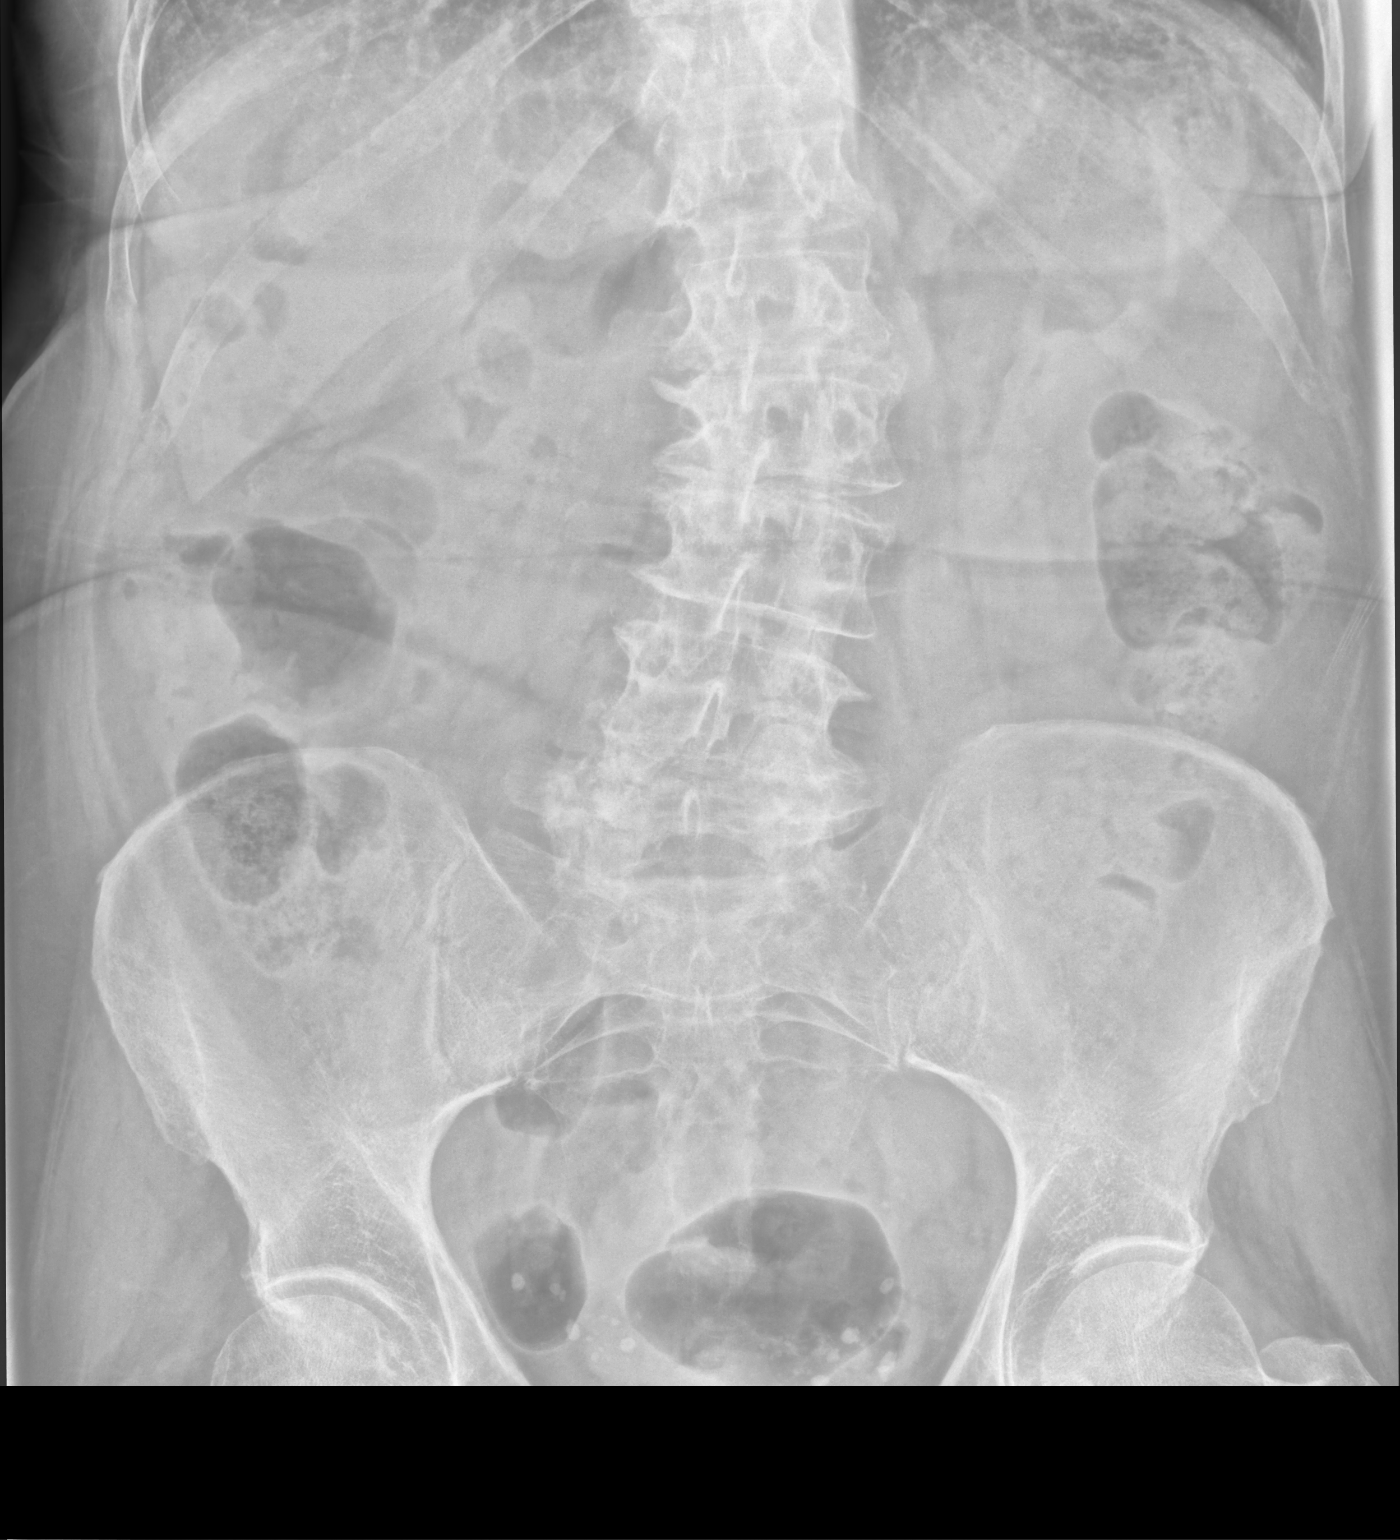

[1 of 1 positions shown; findings below may reference images not displayed]

FINDINGS: No evidence of bowel obstruction. The possibility of free
intraperitoneal air cannot be assessed on a supine view.
Calcifications pelvic inlet most consistent with phleboliths.
Scoliosis lumbar spine with superimposed degenerative changes.
IMPRESSION: 1. No evidence of bowel obstruction.
2. Scoliosis lumbar spine with superimposed degenerative changes.

## 2021-01-11 ENCOUNTER — Other Ambulatory Visit: Payer: Self-pay | Admitting: Internal Medicine

## 2021-03-11 ENCOUNTER — Encounter: Payer: Self-pay | Admitting: Family Medicine

## 2021-03-11 ENCOUNTER — Other Ambulatory Visit: Payer: Self-pay

## 2021-03-11 ENCOUNTER — Ambulatory Visit (INDEPENDENT_AMBULATORY_CARE_PROVIDER_SITE_OTHER): Payer: Medicare Other | Admitting: Family Medicine

## 2021-03-11 VITALS — BP 124/70 | HR 72 | Temp 97.9°F | Ht 67.0 in | Wt 181.6 lb

## 2021-03-11 DIAGNOSIS — R739 Hyperglycemia, unspecified: Secondary | ICD-10-CM

## 2021-03-11 DIAGNOSIS — E785 Hyperlipidemia, unspecified: Secondary | ICD-10-CM | POA: Diagnosis not present

## 2021-03-11 DIAGNOSIS — E038 Other specified hypothyroidism: Secondary | ICD-10-CM

## 2021-03-11 DIAGNOSIS — J329 Chronic sinusitis, unspecified: Secondary | ICD-10-CM

## 2021-03-11 LAB — CBC
HCT: 37.9 % (ref 36.0–46.0)
Hemoglobin: 13 g/dL (ref 12.0–15.0)
MCHC: 34.2 g/dL (ref 30.0–36.0)
MCV: 95.3 fl (ref 78.0–100.0)
Platelets: 224 10*3/uL (ref 150.0–400.0)
RBC: 3.98 Mil/uL (ref 3.87–5.11)
RDW: 13 % (ref 11.5–15.5)
WBC: 6.8 10*3/uL (ref 4.0–10.5)

## 2021-03-11 LAB — COMPREHENSIVE METABOLIC PANEL
ALT: 27 U/L (ref 0–35)
AST: 29 U/L (ref 0–37)
Albumin: 4.6 g/dL (ref 3.5–5.2)
Alkaline Phosphatase: 43 U/L (ref 39–117)
BUN: 15 mg/dL (ref 6–23)
CO2: 27 mEq/L (ref 19–32)
Calcium: 9.4 mg/dL (ref 8.4–10.5)
Chloride: 103 mEq/L (ref 96–112)
Creatinine, Ser: 0.94 mg/dL (ref 0.40–1.20)
GFR: 56.99 mL/min — ABNORMAL LOW (ref >60.00)
Glucose, Bld: 96 mg/dL (ref 70–99)
Potassium: 4.8 mEq/L (ref 3.5–5.1)
Sodium: 136 mEq/L (ref 135–145)
Total Bilirubin: 0.4 mg/dL (ref 0.2–1.2)
Total Protein: 7 g/dL (ref 6.0–8.3)

## 2021-03-11 LAB — LIPID PANEL
Cholesterol: 102 mg/dL (ref 0–200)
HDL: 38.7 mg/dL — ABNORMAL LOW (ref >39.00)
LDL Cholesterol: 45 mg/dL (ref 0–99)
NonHDL: 63.61
Total CHOL/HDL Ratio: 3
Triglycerides: 93 mg/dL (ref 0.0–149.0)
VLDL: 18.6 mg/dL (ref 0.0–40.0)

## 2021-03-11 LAB — HEMOGLOBIN A1C: Hgb A1c MFr Bld: 5.8 % (ref 4.6–6.5)

## 2021-03-11 LAB — TSH: TSH: 5.62 u[IU]/mL — ABNORMAL HIGH (ref 0.35–5.50)

## 2021-03-11 MED ORDER — CIPROFLOXACIN-DEXAMETHASONE 0.3-0.1 % OT SUSP: 4.0000 [drp] | Freq: Two times a day (BID) | OTIC | 0 refills | Status: DC

## 2021-03-11 MED ORDER — AMOXICILLIN-POT CLAVULANATE 875-125 MG PO TABS: 1.0000 | ORAL_TABLET | Freq: Two times a day (BID) | ORAL | 0 refills | Status: DC

## 2021-03-11 NOTE — Assessment & Plan Note (Signed)
Check lipids 

## 2021-03-11 NOTE — Progress Notes (Signed)
° °  Shari Horn is a 81 y.o. female who presents today for an office visit.  Assessment/Plan:  New/Acute Problems: Sinusitis No red flags.  Given length of symptoms we will start Augmentin.  Also start Astelin.  We will also refill her Ciprodex.  This is worked well for her in the past.  Encouraged hydration.  We discussed reasons to return to care.  Chronic Problems Addressed Today: Dyslipidemia Check lipids.   Hypothyroidism Last TSH at goal.  Continue Synthroid 75 mcg daily.  Check TSH.    Subjective:  HPI:  Patient here with fatigue. This has been going on for a week. Her symptoms include earache, headache, stomach ache and chills. She has been feeling more fatigued. She has not tried any treatment for this issue. She does have a some congestion. Some chills. No fever. She had similar issue in the past. She was prescribed on Augmentin and Astelin.  She notes this has helped. No precipitating factors. No sick contacts.       Objective:  Physical Exam: BP 124/70 (BP Location: Right Arm)    Pulse 72    Temp 97.9 F (36.6 C) (Temporal)    Ht 5\' 7"  (1.702 m)    Wt 181 lb 9.6 oz (82.4 kg)    LMP  (LMP Unknown)    SpO2 95%    BMI 28.44 kg/m   Gen: No acute distress, resting comfortably HEENT: TMs with clear effusion bilaterally.  Bilateral EACs with inflammation and irritation.  OP erythematous.  No exudate.  No lymphadenopathy.  Nasal mucosa erythematous and boggy bilaterally with clear discharge. CV: Regular rate and rhythm with no murmurs appreciated Pulm: Normal work of breathing, clear to auscultation bilaterally with no crackles, wheezes, or rhonchi Neuro: Grossly normal, moves all extremities Psych: Normal affect and thought content      I,Savera Zaman,acting as a scribe for Dimas Chyle, MD.,have documented all relevant documentation on the behalf of Dimas Chyle, MD,as directed by  Dimas Chyle, MD while in the presence of Dimas Chyle, MD.   I, Dimas Chyle, MD, have  reviewed all documentation for this visit. The documentation on 03/11/21 for the exam, diagnosis, procedures, and orders are all accurate and complete.  Algis Greenhouse. Jerline Pain, MD 03/11/2021 10:28 AM

## 2021-03-11 NOTE — Assessment & Plan Note (Signed)
Last TSH at goal.  Continue Synthroid 75 mcg daily.  Check TSH.

## 2021-03-11 NOTE — Patient Instructions (Signed)
It was very nice to see you today!  Please start the nasal spray, eardrops, and antibiotic.  Make sure you are getting plenty of fluids.  We will check blood work today.  Let us know if not improving in the next several days.  Take care, Dr Jerline Pain  PLEASE NOTE:  If you had any lab tests please let us know if you have not heard back within a few days. You may see your results on mychart before we have a chance to review them but we will give you a call once they are reviewed by [place]. If we ordered any referrals today, please let us know if you have not heard from their office within the next week.   Please try these tips to maintain a healthy lifestyle:  Eat at least 3 REAL meals and 1-2 snacks per day.  Aim for no more than 5 hours between eating.  If you eat breakfast, please do so within one hour of getting up.   Each meal should contain half fruits/vegetables, one quarter protein, and one quarter carbs (no bigger than a computer mouse)  Cut down on sweet beverages. This includes juice, soda, and sweet tea.   Drink at least 1 glass of water with each meal and aim for at least 8 glasses per day  Exercise at least 150 minutes every week.    Preventive Care 29 Years and Older, Female Preventive care refers to lifestyle choices and visits with your health care provider that can promote health and wellness. Preventive care visits are also called wellness exams. What can I expect for my preventive care visit? Counseling Your health care provider may ask you questions about your: Medical history, including: Past medical problems. Family medical history. Pregnancy and menstrual history. History of falls. Current health, including: Memory and ability to understand (cognition). Emotional well-being. Home life and relationship well-being. Sexual activity and sexual health. Lifestyle, including: Alcohol, nicotine or tobacco, and drug use. Access to firearms. Diet, exercise, and  sleep habits. Work and work Statistician. Sunscreen use. Safety issues such as seatbelt and bike helmet use. Physical exam Your health care provider will check your: Height and weight. These may be used to calculate your BMI (body mass index). BMI is a measurement that tells if you are at a healthy weight. Waist circumference. This measures the distance around your waistline. This measurement also tells if you are at a healthy weight and may help predict your risk of certain diseases, such as type 2 diabetes and high blood pressure. Heart rate and blood pressure. Body temperature. Skin for abnormal spots. What immunizations do I need? Vaccines are usually given at various ages, according to a schedule. Your health care provider will recommend vaccines for you based on your age, medical history, and lifestyle or other factors, such as travel or where you work. What tests do I need? Screening Your health care provider may recommend screening tests for certain conditions. This may include: Lipid and cholesterol levels. Hepatitis C test. Hepatitis B test. HIV (human immunodeficiency virus) test. STI (sexually transmitted infection) testing, if you are at risk. Lung cancer screening. Colorectal cancer screening. Diabetes screening. This is done by checking your blood sugar (glucose) after you have not eaten for a while (fasting). Mammogram. Talk with your health care provider about how often you should have regular mammograms. BRCA-related cancer screening. This may be done if you have a family history of breast, ovarian, tubal, or peritoneal cancers. Bone density scan. This is  done to screen for osteoporosis. Talk with your health care provider about your test results, treatment options, and if necessary, the need for more tests. Follow these instructions at home: Eating and drinking  Eat a diet that includes fresh fruits and vegetables, whole grains, lean protein, and low-fat dairy  products. Limit your intake of foods with high amounts of sugar, saturated fats, and salt. Take vitamin and mineral supplements as recommended by your health care provider. Do not drink alcohol if your health care provider tells you not to drink. If you drink alcohol: Limit how much you have to 0-1 drink a day. Know how much alcohol is in your drink. In the [place] one drink equals one 12 oz bottle of beer (355 mL), one 5 oz glass of wine (148 mL), or one 1 oz glass of hard liquor (44 mL). Lifestyle Brush your teeth every morning and night with fluoride toothpaste. Floss one time each day. Exercise for at least 30 minutes 5 or more days each week. Do not use any products that contain nicotine or tobacco. These products include cigarettes, chewing tobacco, and vaping devices, such as e-cigarettes. If you need help quitting, ask your health care provider. Do not use drugs. If you are sexually active, practice safe sex. Use a condom or other form of protection in order to prevent STIs. Take aspirin only as told by your health care provider. Make sure that you understand how much to take and what form to take. Work with your health care provider to find out whether it is safe and beneficial for you to take aspirin daily. Ask your health care provider if you need to take a cholesterol-lowering medicine (statin). Find healthy ways to manage stress, such as: Meditation, yoga, or listening to music. Journaling. Talking to a trusted person. Spending time with friends and family. Minimize exposure to UV radiation to reduce your risk of skin cancer. Safety Always wear your seat belt while driving or riding in a vehicle. Do not drive: If you have been drinking alcohol. Do not ride with someone who has been drinking. When you are tired or distracted. While texting. If you have been using any mind-altering substances or drugs. Wear a helmet and other protective equipment during sports activities. If you  have firearms in your house, make sure you follow all gun safety procedures. What's next? Visit your health care provider once a year for an annual wellness visit. Ask your health care provider how often you should have your eyes and teeth checked. Stay up to date on all vaccines. This information is not intended to replace advice given to you by your health care provider. Make sure you discuss any questions you have with your health care provider. Document Revised: 07/03/2020 Document Reviewed: 07/03/2020 Elsevier Patient Education  [employer].

## 2021-03-13 ENCOUNTER — Other Ambulatory Visit: Payer: Self-pay

## 2021-03-13 DIAGNOSIS — E038 Other specified hypothyroidism: Secondary | ICD-10-CM

## 2021-03-13 NOTE — Progress Notes (Signed)
Please inform patient of the following:  Thyroid is a little off.  Recommend she come back in 1 to 2 weeks to recheck TSH before making dose changes.  Please place future order.  Cholesterol blood sugar borderline but stable.  We can recheck these in a year.  No need to make any other changes in medication or treatment plan at this time.

## 2021-03-18 ENCOUNTER — Ambulatory Visit (HOSPITAL_COMMUNITY)
Admission: RE | Admit: 2021-03-18 | Discharge: 2021-03-18 | Disposition: A | Discharge status: Home / Self Care | Payer: Medicare Other | Source: Ambulatory Visit | Attending: Cardiology | Admitting: Cardiology

## 2021-03-18 ENCOUNTER — Other Ambulatory Visit: Payer: Self-pay

## 2021-03-18 DIAGNOSIS — I779 Disorder of arteries and arterioles, unspecified: Secondary | ICD-10-CM | POA: Insufficient documentation

## 2021-03-18 DIAGNOSIS — I6523 Occlusion and stenosis of bilateral carotid arteries: Secondary | ICD-10-CM

## 2021-03-19 ENCOUNTER — Other Ambulatory Visit: Payer: Self-pay | Admitting: *Deleted

## 2021-03-19 ENCOUNTER — Encounter: Payer: Self-pay | Admitting: Internal Medicine

## 2021-03-19 DIAGNOSIS — I6523 Occlusion and stenosis of bilateral carotid arteries: Secondary | ICD-10-CM

## 2021-03-21 ENCOUNTER — Other Ambulatory Visit: Payer: Self-pay | Admitting: Family Medicine

## 2021-03-21 DIAGNOSIS — F3181 Bipolar II disorder: Secondary | ICD-10-CM

## 2021-03-21 NOTE — Telephone Encounter (Signed)
Last Visit: 03/11/21 ? ?Next Visit: None scheduled ? ?Last Filled: 10/09/20 Seroquel 90 w/ 0 refills ?        09/17/20 Clonazepam 90 w/ 5 refills  ? ? ?

## 2021-03-27 ENCOUNTER — Other Ambulatory Visit: Payer: Medicare Other

## 2021-04-11 ENCOUNTER — Other Ambulatory Visit: Payer: Self-pay | Admitting: Internal Medicine

## 2021-04-11 ENCOUNTER — Other Ambulatory Visit: Payer: Self-pay | Admitting: Family Medicine

## 2021-04-18 ENCOUNTER — Other Ambulatory Visit (INDEPENDENT_AMBULATORY_CARE_PROVIDER_SITE_OTHER): Payer: Medicare Other

## 2021-04-18 DIAGNOSIS — E038 Other specified hypothyroidism: Secondary | ICD-10-CM | POA: Diagnosis not present

## 2021-04-18 LAB — TSH: TSH: 4.67 u[IU]/mL (ref 0.35–5.50)

## 2021-04-22 NOTE — Progress Notes (Signed)
Please inform patient of the following: ? ?Thyroid level is stable. Do not need to make any medication adjustments. We can recheck next office visit. ? ?Algis Greenhouse. Jerline Pain, MD ?04/22/2021 11:06 AM  ?

## 2021-04-24 ENCOUNTER — Other Ambulatory Visit: Payer: Self-pay | Admitting: Family Medicine

## 2021-04-30 ENCOUNTER — Other Ambulatory Visit: Payer: Self-pay | Admitting: Internal Medicine

## 2021-04-30 ENCOUNTER — Ambulatory Visit (INDEPENDENT_AMBULATORY_CARE_PROVIDER_SITE_OTHER): Payer: Medicare Other | Admitting: Family Medicine

## 2021-04-30 VITALS — BP 108/74 | HR 75 | Temp 98.1°F | Ht 67.0 in | Wt 185.4 lb

## 2021-04-30 DIAGNOSIS — F3181 Bipolar II disorder: Secondary | ICD-10-CM

## 2021-04-30 MED ORDER — BUPROPION HCL ER (XL) 150 MG PO TB24: 150.0000 mg | ORAL_TABLET | Freq: Every day | ORAL | 3 refills | Status: DC

## 2021-04-30 NOTE — Patient Instructions (Signed)
It was very nice to see you today! ? ?Please start the Wellbutrin.  Send a message in a few weeks to let me know how you are doing. ? ?You have inflammation in the ear canal.  Please start the drops.  Start the nasal spray again as well.  Let me know if not proving in the next couple of weeks and we can refer you to see ENT. ? ?Take care, ?Dr Jerline Pain ? ?PLEASE NOTE: ? ?If you had any lab tests please let us know if you have not heard back within a few days. You may see your results on mychart before we have a chance to review them but we will give you a call once they are reviewed by [place]. If we ordered any referrals today, please let us know if you have not heard from their office within the next week.  ? ?Please try these tips to maintain a healthy lifestyle: ? ?Eat at least 3 REAL meals and 1-2 snacks per day.  Aim for no more than 5 hours between eating.  If you eat breakfast, please do so within one hour of getting up.  ? ?Each meal should contain half fruits/vegetables, one quarter protein, and one quarter carbs (no bigger than a computer mouse) ? ?Cut down on sweet beverages. This includes juice, soda, and sweet tea.  ? ?Drink at least 1 glass of water with each meal and aim for at least 8 glasses per day ? ?Exercise at least 150 minutes every week.   ?

## 2021-04-30 NOTE — Assessment & Plan Note (Signed)
No manic symptoms currently.  She is usually very aware when this is happening.  She has been feeling a little bit more down and depressed recently.  No red flags.  No reported SI or HI.  She is currently on Lamictal 50 mg daily, Prozac 60 mg daily, and Seroquel 300 mg nightly.  We will add on Wellbutrin to her list.  She has previously been prescribed this by her previous psychiatrist.  We discussed potential side effects including precipitation of manic symptoms however do not anticipate this will be the case.  She will follow-up in a couple weeks via [place]. ?

## 2021-04-30 NOTE — Progress Notes (Signed)
? ?  Shari Horn is a 81 y.o. female who presents today for an office visit. ? ?Assessment/Plan:  ?New/Acute Problems: ?Otalgia ?No red flags.  No signs of otitis media.  She may have some eustachian tube dysfunction that is contributing.  She also has a fair amount of seborrheic dermatitis which could be contributing as well.  Recommend she restart Ciprodex drops and Astelin nasal spray.  She will let me know if not proving in the next few weeks and we can refer to ENT. ? ?Chronic Problems Addressed Today: ?Bipolar 2 disorder ([place]) ?No manic symptoms currently.  She is usually very aware when this is happening.  She has been feeling a little bit more down and depressed recently.  No red flags.  No reported SI or HI.  She is currently on Lamictal 50 mg daily, Prozac 60 mg daily, and Seroquel 300 mg nightly.  We will add on Wellbutrin to her list.  She has previously been prescribed this by her previous psychiatrist.  We discussed potential side effects including precipitation of manic symptoms however do not anticipate this will be the case.  She will follow-up in a couple weeks via [place]. ? ? ?  ?Subjective:  ?HPI: ? ?See A/P for status of chronic conditions.  She is concerned about worsening depression.  She has been feeling more down recently.  No SI or HI.  No manic episodes.  One of her psychiatrist had previously prescribed her Wellbutrin and she is interested in going back on this.  She still has the prescription at home however is out of date.  Feels decreased energy.  Low motivation. ? ?She has also had bilateral ear pain that has been worsening recently.  She is not sure if this is due to her depression or not either.  This has been on and off for the last several months.  We have seen her couple of times for this.  In the past we have treated her with Ciprodex and Astelin with some improvement. ? ?   ?  ?Objective:  ?Physical Exam: ?BP 108/74 (BP Location: Left Arm)   Pulse 75   Temp 98.1 ?F (36.7 ?C)  (Temporal)   Ht '5\' 7"'$  (1.702 m)   Wt 185 lb 6.4 oz (84.1 kg)   LMP  (LMP Unknown)   SpO2 97%   BMI 29.04 kg/m?   ?Gen: No acute distress, resting comfortably ?HEENT: Bilateral TMs with clear effusion.  EACs with irritation and possible seborrheic dermatitis bilaterally. ?CV: Regular rate and rhythm with no murmurs appreciated ?Pulm: Normal work of breathing, clear to auscultation bilaterally with no crackles, wheezes, or rhonchi ?Neuro: Grossly normal, moves all extremities ?Psych: Normal affect and thought content ? ?   ? ?Algis Greenhouse. Jerline Pain, MD ?04/30/2021 12:19 PM  ?

## 2021-05-05 NOTE — Progress Notes (Signed)
This encounter was created in error - please disregard.

## 2021-05-28 DIAGNOSIS — Z961 Presence of intraocular lens: Secondary | ICD-10-CM | POA: Diagnosis not present

## 2021-05-28 DIAGNOSIS — H518 Other specified disorders of binocular movement: Secondary | ICD-10-CM | POA: Diagnosis not present

## 2021-05-28 DIAGNOSIS — Z79899 Other long term (current) drug therapy: Secondary | ICD-10-CM | POA: Diagnosis not present

## 2021-05-28 DIAGNOSIS — H5589 Other irregular eye movements: Secondary | ICD-10-CM | POA: Diagnosis not present

## 2021-05-28 DIAGNOSIS — H532 Diplopia: Secondary | ICD-10-CM | POA: Diagnosis not present

## 2021-06-17 ENCOUNTER — Other Ambulatory Visit: Payer: Self-pay | Admitting: Internal Medicine

## 2021-06-18 ENCOUNTER — Other Ambulatory Visit: Payer: Self-pay | Admitting: *Deleted

## 2021-06-18 DIAGNOSIS — F3181 Bipolar II disorder: Secondary | ICD-10-CM

## 2021-06-18 MED ORDER — CLONAZEPAM 0.5 MG PO TABS: 0.5000 mg | ORAL_TABLET | Freq: Three times a day (TID) | ORAL | 1 refills | Status: DC

## 2021-06-18 MED ORDER — QUETIAPINE FUMARATE 300 MG PO TABS: 300.0000 mg | ORAL_TABLET | Freq: Every day | ORAL | 0 refills | Status: DC

## 2021-06-30 ENCOUNTER — Telehealth: Payer: Self-pay | Admitting: Family Medicine

## 2021-06-30 NOTE — Telephone Encounter (Signed)
Spoke with patient she req CB 07/14/21

## 2021-07-08 ENCOUNTER — Other Ambulatory Visit: Payer: Self-pay | Admitting: Internal Medicine

## 2021-07-09 ENCOUNTER — Other Ambulatory Visit: Payer: Self-pay | Admitting: *Deleted

## 2021-07-09 MED ORDER — FLUOXETINE HCL 20 MG PO CAPS: ORAL_CAPSULE | ORAL | 3 refills | Status: DC

## 2021-07-16 ENCOUNTER — Ambulatory Visit: Payer: Medicare Other | Admitting: Physician Assistant

## 2021-07-16 ENCOUNTER — Ambulatory Visit: Payer: Medicare Other

## 2021-07-16 ENCOUNTER — Encounter: Payer: Self-pay | Admitting: Physician Assistant

## 2021-07-16 VITALS — BP 138/74 | HR 82 | Ht 67.0 in | Wt 185.0 lb

## 2021-07-16 DIAGNOSIS — R296 Repeated falls: Secondary | ICD-10-CM

## 2021-07-16 DIAGNOSIS — R55 Syncope and collapse: Secondary | ICD-10-CM

## 2021-07-16 NOTE — Progress Notes (Unsigned)
Enrolled for [employer] to mail a ZIO AT Live Telemetry monitor to patients address on file.   Dr. Hilty to read. 

## 2021-07-16 NOTE — Progress Notes (Signed)
Cardiology Office Note:    Date:  07/18/2021   ID:  Shari Horn, DOB August 22, 1940, MRN [id number]  PCP:  Vivi Barrack, MD   [facility] Cardiologist:  Pixie Casino, MD     Referring MD: Vivi Barrack, MD   Chief Complaint  Patient presents with   Follow-up    Seen for Dr. Debara Pickett    History of Present Illness:    Shari Horn is a 81 y.o. female with a hx of HLD, PVCs, hypothyroidism, bipolar disorder and carotid artery disease.  She has known 40-59% left internal carotid artery stenosis and at 0-39% right carotid stenosis based on carotid ultrasound 02/04/2016. Last echocardiogram obtained on 01/06/2012 showed EF 60-65%.  Repeat echocardiogram in August 2018 demonstrated EF of 60 to 65%, mild AI, mild MR, mild LAE, no regional wall motion abnormality, grade 1 DD.  Patient was last seen by Dr. Debara Pickett in April 2021 at which time she was doing well.  Most recent carotid Doppler in February 2013 demonstrated mild disease bilaterally.  Patient presents for follow-up.  She denies any recent chest pain worsening dyspnea.  Her primary concern is still with the frequent fall she has.  She has underwent extensive neurological work-up in the past and was told by her neurologist to consider additional cardiac evaluation for her fall.  The etiology behind her fall is mixed, she had a fall this morning in the shower, however that was clearly due to her being slipped and fell.  However she also described an episode prior to her hip fracture back in 2019 where she was standing and holding onto the back of a chair and that the next moment she woke up on the floor.  She do not remember the falling episode.  She did not have any prodromal symptoms.  When she described this to her neurologist, her neurologist told her that the symptom was concerning for cardiogenic syncope.  Interestingly, she has not seen her neurologist since the January 2022.  I mentioned to the patient that her recent falling  episodes sounds more like a mechanical fall, however I do not have explanation regarding her fall back in 2019 prior to the hip fracture.  I mentioned to the patient that the probability that her previous syncope was coming from the heart is fairly low since she has not had any prominent syncope in the past 4 years.  However given the mixed picture behind her frequent falls, I think it would be reasonable to consider a repeat echocardiogram and a 2-week heart monitor.  She has been instructed to do manual triggered events with her future falls, this will allow [place] to see if any of the following episode is related to underlying irregular rhythm.  I plan to see the patient back in 6 weeks for reassessment.  Past Medical History:  Diagnosis Date   Carotid artery disease ([place])    Depression    Dyslipidemia    Hyperlipidemia    Hypothyroidism    Mood disorder (HCC)    BPD   PVC's (premature ventricular contractions)     Past Surgical History:  Procedure Laterality Date   BREAST BIOPSY     BREAST EXCISIONAL BIOPSY Left 1990   BREAST EXCISIONAL BIOPSY Left 1996   Carotid Doppler  12/2011   0-49% RICA stenosis, 41-66% LICA stenosis   TOTAL HIP ARTHROPLASTY Left 12/12/2017   Procedure: TOTAL HIP ARTHROPLASTY ANTERIOR APPROACH;  Surgeon: Rod Can, MD;  Location: [facility];  Service: Orthopedics;  Laterality: Left;   TRANSTHORACIC ECHOCARDIOGRAM  12/2011   EF 09-81%, grade 1 diastolic dysfunction; trivial AV regurg; calcified MV annulus    Current Medications: Current Meds  Medication Sig   buPROPion (WELLBUTRIN XL) 150 MG 24 hr tablet Take 1 tablet (150 mg total) by mouth daily.   cholecalciferol (VITAMIN D3) 25 MCG (1000 UT) tablet Take 2,000 Units by mouth daily.   ciprofloxacin-dexamethasone (CIPRODEX) OTIC suspension Place 4 drops into both ears 2 (two) times daily.   clonazePAM (KLONOPIN) 0.5 MG tablet Take 1 tablet (0.5 mg total) by mouth 3 (three) times daily.   ezetimibe (ZETIA) 10  MG tablet TAKE 1 TABLET BY MOUTH DAILY   Fluocinolone Acetonide Body 0.01 % OIL Apply to affected area qd   FLUoxetine (PROZAC) 20 MG capsule TAKE 3 CAPSULES(60 MG) BY MOUTH DAILY   lamoTRIgine (LAMICTAL) 25 MG tablet TAKE 1 TABLET(25 MG) BY MOUTH DAILY   levothyroxine (SYNTHROID) 75 MCG tablet TAKE 1 TABLET(75 MCG) BY MOUTH DAILY   meclizine (ANTIVERT) 25 MG tablet Take 1 tablet (25 mg total) by mouth 3 (three) times daily as needed for dizziness.   Multiple Vitamin (MULTIVITAMIN WITH MINERALS) TABS tablet Take 1 tablet by mouth daily.   pantoprazole (PROTONIX) 40 MG tablet TAKE 1 TABLET(40 MG) BY MOUTH DAILY   prednisoLONE acetate (PRED FORTE) 1 % ophthalmic suspension    QUEtiapine (SEROQUEL) 300 MG tablet Take 1 tablet (300 mg total) by mouth at bedtime.   rosuvastatin (CRESTOR) 40 MG tablet Take 1 tablet (40 mg total) by mouth daily. Appointment with cardiologist needed for further refills   senna (SENOKOT) 8.6 MG TABS tablet Take 5 tablets by mouth at bedtime.      Allergies:   Codeine   Social History   Socioeconomic History   Marital status: Divorced    Spouse name: Not on file   Number of children: 2   Years of education: master's   Highest education level: Not on file  Occupational History    Comment: retired   Tobacco Use   Smoking status: Former    Years: 10.00    Types: Cigarettes    Quit date: 12/19/1972    Years since quitting: 48.6   Smokeless tobacco: Never  Vaping Use   Vaping Use: Never used  Substance and Sexual Activity   Alcohol use: No    Comment: quit in 1973   Drug use: No   Sexual activity: Not on file  Other Topics Concern   Not on file  Social History Narrative   Right handed   Two story home   Drinks caffeine   Social Determinants of Health   Financial Resource Strain: Low Risk  (04/29/2020)   Overall Financial Resource Strain (CARDIA)    Difficulty of Paying Living Expenses: Not hard at all  Food Insecurity: No Food Insecurity  (04/29/2020)   Hunger Vital Sign    Worried About Running Out of Food in the Last Year: Never true    [place] in the Last Year: Never true  Transportation Needs: No Transportation Needs (04/29/2020)   PRAPARE - Hydrologist (Medical): No    Lack of Transportation (Non-Medical): No  Physical Activity: Inactive (04/29/2020)   Exercise Vital Sign    Days of Exercise per Week: 0 days    Minutes of Exercise per Session: 0 min  Stress: No Stress Concern Present (04/29/2020)   [place]  Stress Questionnaire    Feeling of Stress : Only a little  Social Connections: Socially Isolated (04/29/2020)   Social Connection and Isolation Panel [NHANES]    Frequency of Communication with Friends and Family: More than three times a week    Frequency of Social Gatherings with Friends and Family: More than three times a week    Attends Religious Services: Never    Marine scientist or Organizations: No    Attends Music therapist: Never    Marital Status: Divorced     Family History: The patient's family history includes Arthritis/Rheumatoid in her father; Diabetes in her mother; Heart Problems in her maternal grandmother; Heart disease in her brother and child; Heart failure in her mother; Hypertension in her mother; Thyroid disease in her mother. There is no history of Colon cancer, Esophageal cancer, or Stomach cancer.  ROS:   Please see the history of present illness.     All other systems reviewed and are negative.  EKGs/Labs/Other Studies Reviewed:    The following studies were reviewed today:  Echo 09/03/2016 LV EF: 60% -   65%   -------------------------------------------------------------------  Indications:      Neck pain (M54.2).   -------------------------------------------------------------------  History:   Risk factors:  Carotid artery disease. Palpitation. PVC.  Former tobacco use.  Dyslipidemia.   -------------------------------------------------------------------  Study Conclusions   - Left ventricle: The cavity size was normal. Systolic function was    normal. The estimated ejection fraction was in the range of 60%    to 65%. Wall motion was normal; there were no regional wall    motion abnormalities. Doppler parameters are consistent with    abnormal left ventricular relaxation (grade 1 diastolic    dysfunction).  - Aortic valve: There was mild regurgitation.  - Mitral valve: There was mild regurgitation.  - Left atrium: The atrium was mildly dilated.   EKG:  EKG is ordered today.  The ekg ordered today demonstrates normal sinus rhythm, no significant ST-T wave changes  Recent Labs: 03/11/2021: ALT 27; BUN 15; Creatinine, Ser 0.94; Hemoglobin 13.0; Platelets 224.0; Potassium 4.8; Sodium 136 04/18/2021: TSH 4.67  Recent Lipid Panel    Component Value Date/Time   CHOL 102 03/11/2021 1025   CHOL 92 (L) 08/20/2016 1038   CHOL 128 03/13/2014 1148   TRIG 93.0 03/11/2021 1025   TRIG 149 03/13/2014 1148   HDL 38.70 (L) 03/11/2021 1025   HDL 41 08/20/2016 1038   HDL 33 (L) 03/13/2014 1148   CHOLHDL 3 03/11/2021 1025   VLDL 18.6 03/11/2021 1025   LDLCALC 45 03/11/2021 1025   LDLCALC 28 08/20/2016 1038   LDLCALC 65 03/13/2014 1148     Risk Assessment/Calculations:           Physical Exam:    VS:  BP 138/74   Pulse 82   Ht 5' 7" (1.702 m)   Wt 185 lb (83.9 kg)   LMP  (LMP Unknown)   SpO2 94%   BMI 28.98 kg/m     Wt Readings from Last 3 Encounters:  07/16/21 185 lb (83.9 kg)  04/30/21 185 lb 6.4 oz (84.1 kg)  03/11/21 181 lb 9.6 oz (82.4 kg)     GEN:  Well nourished, well developed in no acute distress HEENT: Normal NECK: No JVD; No carotid bruits LYMPHATICS: No lymphadenopathy CARDIAC: RRR, no murmurs, rubs, gallops RESPIRATORY:  Clear to auscultation without rales, wheezing or rhonchi  ABDOMEN: Soft, non-tender,  non-distended MUSCULOSKELETAL:  No edema;  No deformity  SKIN: Warm and dry NEUROLOGIC:  Alert and oriented x 3 PSYCHIATRIC:  Normal affect   ASSESSMENT:    1. Syncope and collapse   2. Frequent falls    PLAN:    In order of problems listed above:  Syncope: She mentioned today that she previously underwent extensive neurological work-up and her neurologist told her to consider cardiac evaluation for her syncope (although she has not seen her neurologist since at least January 2022 based on chart review).  Although she does have frequent falls, most of the recent fall is more consistent with mechanical fall as she either tripped or slipped.  The only episode of fall that has questionable cardiac etiology was prior to her hip fracture in 2019.  She says she was standing and holding onto the back of a chair and the next moment she woke up on the floor.  Given the fact that she has not had any similar falls in the past 4 years, the probability of significant arrhythmia causing the episode is fairly low as most of the cardiogenic cause tend to recur with time.  Given the mixed picture is behind her fall, I think it would be reasonable to consider heart monitor and echocardiogram  Frequent falls: The main purpose of today's visit is to evaluate her frequent falls.  However listening to the patient's symptom, it sounds like her recent fall was more mechanical in nature, which occurred either as she slipped or tripped.  It does not sound like her recent fall was preceded by significant dizziness.  Although the one episode from 2019 cannot be explained as one moment she was holding onto the back of the chair and the next moment she woke up on the floor.  She did not remember her body falling down.  Patient will need fall precautions.           Medication Adjustments/Labs and Tests Ordered: Current medicines are reviewed at length with the patient today.  Concerns regarding medicines are outlined  above.  Orders Placed This Encounter  Procedures   LONG TERM MONITOR-LIVE TELEMETRY (3-14 DAYS)   EKG 12-Lead   ECHOCARDIOGRAM COMPLETE   No orders of the defined types were placed in this encounter.   Patient Instructions  Medication Instructions:  Your physician recommends that you continue on your current medications as directed. Please refer to the Current Medication list given to you today.  *If you need a refill on your cardiac medications before your next appointment, please call your pharmacy*  Lab Work: NONE ordered at this time of appointment   If you have labs (blood work) drawn today and your tests are completely normal, you will receive your results only by: [place] (if you have MyChart) OR A paper copy in the mail If you have any lab test that is abnormal or we need to change your treatment, we will call you to review the results.  Testing/Procedures: Your physician has requested that you have an echocardiogram. Echocardiography is a painless test that uses sound waves to create images of your heart. It provides your doctor with information about the size and shape of your heart and how well your heart's chambers and valves are working. This procedure takes approximately one hour. There are no restrictions for this procedure.  ZIO AT Long term monitor-Live Telemetry  Your physician has requested you wear a ZIO patch monitor for 14 days.  This is a single patch monitor. [employer] supplies one patch monitor per  enrollment. Additional  stickers are not available.  Please do not apply patch if you will be having a Nuclear Stress Test, Echocardiogram, Cardiac CT, MRI,  or Chest Xray during the period you would be wearing the monitor. The patch cannot be worn during  these tests. You cannot remove and re-apply the ZIO AT patch monitor.  Your ZIO patch monitor will be mailed 3 day USPS to your address on file. It may take 3-5 days to  receive your monitor after you  have been enrolled.  Once you have received your monitor, please review the enclosed instructions. Your monitor has  already been registered assigning a specific monitor serial # to you.   Billing and Patient Assistance Program information  Theodore Demark has been supplied with any insurance information on record for billing. [employer] offers a sliding scale Patient Assistance Program for patients without insurance, or whose  insurance does not completely cover the cost of the ZIO patch monitor. You must apply for the  Patient Assistance Program to qualify for the discounted rate. To apply, call [employer] at [phone],  select option 4, select option 2 , ask to apply for the Patient Assistance Program, (you can request an  interpreter if needed). [employer] will ask your household income and how many people are in your  household. [employer] will quote your out-of-pocket cost based on this information. They will also be able  to set up a 12 month interest free payment plan if needed.  Applying the monitor   Shave hair from upper left chest.  Hold the abrader disc by orange tab. Rub the abrader in 40 strokes over left upper chest as indicated in  your monitor instructions.  Clean area with 4 enclosed alcohol pads. Use all pads to ensure the area is cleaned thoroughly. Let  dry.  Apply patch as indicated in monitor instructions. Patch will be placed under collarbone on left side of  chest with arrow pointing upward.  Rub patch adhesive wings for 2 minutes. Remove the white label marked "1". Remove the white label  marked "2". Rub patch adhesive wings for 2 additional minutes.  While looking in a mirror, press and release button in center of patch. A small green light will flash 3-4  times. This will be your only indicator that the monitor has been turned on.  Do not shower for the first 24 hours. You may shower after the first 24 hours.  Press the button if you feel a symptom. You will hear a small  click. Record Date, Time and Symptom in  the Patient Log.   Starting the Gateway  In your kit there is a Hydrographic surveyor box the size of a cellphone. This is Airline pilot. It transmits all your  recorded data to [facility]. This box must always stay within 10 feet of you. Open the box and push the *  button. There will be a light that blinks orange and then green a few times. When the light stops  blinking, the Gateway is connected to the ZIO patch. Call [employer] at [phone] to confirm your monitor is transmitting.  Returning your monitor  Remove your patch and place it inside the [place]. In the lower half of the Gateway there is a white  bag with prepaid postage on it. Place Gateway in bag and seal. Mail package back to [place] as soon as  possible. Your physician should have your final report approximately 7 days after you have mailed back  your monitor. Call  [place] at [phone] if you have questions regarding your ZIO AT  patch monitor. Call them immediately if you see an orange light blinking on your monitor.  If your monitor falls off in less than 4 days, contact our Monitor department at [phone]. If your  monitor becomes loose or falls off after 4 days call [employer] at [phone] for suggestions on  securing your monitor  Follow-Up: At [facility], you and your health needs are our priority.  As part of our continuing mission to provide you with exceptional heart care, we have created designated Provider Care Teams.  These Care Teams include your primary Cardiologist (physician) and Advanced Practice Providers (APPs -  Physician Assistants and Nurse Practitioners) who all work together to provide you with the care you need, when you need it.     Your next appointment:   6-8 week(s)  The format for your next appointment:   In Person  Provider:   Almyra Deforest, PA-C        Other Instructions   Important Information About  [place]         Hilbert Corrigan, [place]  07/18/2021 10:37 PM    [place]

## 2021-07-16 NOTE — Patient Instructions (Addendum)
Medication Instructions:  Your physician recommends that you continue on your current medications as directed. Please refer to the Current Medication list given to you today.  *If you need a refill on your cardiac medications before your next appointment, please call your pharmacy*  Lab Work: NONE ordered at this time of appointment   If you have labs (blood work) drawn today and your tests are completely normal, you will receive your results only by: [place] (if you have MyChart) OR A paper copy in the mail If you have any lab test that is abnormal or we need to change your treatment, we will call you to review the results.  Testing/Procedures: Your physician has requested that you have an echocardiogram. Echocardiography is a painless test that uses sound waves to create images of your heart. It provides your doctor with information about the size and shape of your heart and how well your heart's chambers and valves are working. This procedure takes approximately one hour. There are no restrictions for this procedure.  ZIO AT Long term monitor-Live Telemetry  Your physician has requested you wear a ZIO patch monitor for 14 days.  This is a single patch monitor. [employer] supplies one patch monitor per enrollment. Additional  stickers are not available.  Please do not apply patch if you will be having a Nuclear Stress Test, Echocardiogram, Cardiac CT, MRI,  or Chest Xray during the period you would be wearing the monitor. The patch cannot be worn during  these tests. You cannot remove and re-apply the ZIO AT patch monitor.  Your ZIO patch monitor will be mailed 3 day USPS to your address on file. It may take 3-5 days to  receive your monitor after you have been enrolled.  Once you have received your monitor, please review the enclosed instructions. Your monitor has  already been registered assigning a specific monitor serial # to you.   Billing and Patient Assistance Program  information  Shari Horn has been supplied with any insurance information on record for billing. [employer] offers a sliding scale Patient Assistance Program for patients without insurance, or whose  insurance does not completely cover the cost of the ZIO patch monitor. You must apply for the  Patient Assistance Program to qualify for the discounted rate. To apply, call [employer] at [phone],  select option 4, select option 2 , ask to apply for the Patient Assistance Program, (you can request an  interpreter if needed). [employer] will ask your household income and how many people are in your  household. [employer] will quote your out-of-pocket cost based on this information. They will also be able  to set up a 12 month interest free payment plan if needed.  Applying the monitor   Shave hair from upper left chest.  Hold the abrader disc by orange tab. Rub the abrader in 40 strokes over left upper chest as indicated in  your monitor instructions.  Clean area with 4 enclosed alcohol pads. Use all pads to ensure the area is cleaned thoroughly. Let  dry.  Apply patch as indicated in monitor instructions. Patch will be placed under collarbone on left side of  chest with arrow pointing upward.  Rub patch adhesive wings for 2 minutes. Remove the white label marked "1". Remove the white label  marked "2". Rub patch adhesive wings for 2 additional minutes.  While looking in a mirror, press and release button in center of patch. A small green light will flash 3-4  times. This will  be your only indicator that the monitor has been turned on.  Do not shower for the first 24 hours. You may shower after the first 24 hours.  Press the button if you feel a symptom. You will hear a small click. Record Date, Time and Symptom in  the Patient Log.   Starting the Gateway  In your kit there is a Hydrographic surveyor box the size of a cellphone. This is Airline pilot. It transmits all your  recorded data to [facility]. This box  must always stay within 10 feet of you. Open the box and push the *  button. There will be a light that blinks orange and then green a few times. When the light stops  blinking, the Gateway is connected to the ZIO patch. Call [employer] at [phone] to confirm your monitor is transmitting.  Returning your monitor  Remove your patch and place it inside the [place]. In the lower half of the Gateway there is a white  bag with prepaid postage on it. Place Gateway in bag and seal. Mail package back to [place] as soon as  possible. Your physician should have your final report approximately 7 days after you have mailed back  your monitor. Call [place] at [phone] if you have questions regarding your ZIO AT  patch monitor. Call them immediately if you see an orange light blinking on your monitor.  If your monitor falls off in less than 4 days, contact our Monitor department at [phone]. If your  monitor becomes loose or falls off after 4 days call [employer] at [phone] for suggestions on  securing your monitor  Follow-Up: At [facility], you and your health needs are our priority.  As part of our continuing mission to provide you with exceptional heart care, we have created designated Provider Care Teams.  These Care Teams include your primary Cardiologist (physician) and Advanced Practice Providers (APPs -  Physician Assistants and Nurse Practitioners) who all work together to provide you with the care you need, when you need it.     Your next appointment:   6-8 week(s)  The format for your next appointment:   In Person  Provider:   Almyra Deforest, PA-C        Other Instructions   Important Information About [place]

## 2021-07-18 ENCOUNTER — Encounter: Payer: Self-pay | Admitting: Physician Assistant

## 2021-07-18 ENCOUNTER — Telehealth: Payer: Self-pay | Admitting: Physician Assistant

## 2021-07-18 NOTE — Telephone Encounter (Signed)
During the interview, her falls seems to be a mixed mechanical fall and possibly syncope. There was a episode of fall in 2019 where she says she feels like she passed out. Per Shari Horn, she spoke to her neurologist before about this episode and her neurologist suggested her to talk to cardiologist due to concern for cardiogenic cause. (Her last visit with neurology was in Jan 2022, not sure why she waited until now to discuss this) I offered her a heart monitor to further evaluate and aware that she has not had any more passing out since 2019, it sounds like she has had a few more falls since then, but they were mechanical falls where she slipped. The likelihood the heart monitor would reveal something would be low since most of the cardiogenic cause of passing out spell tend to recur, and she did not have recurrence. I offered the heart monitor due to concern by neurology service. If she wants to, I think it is quite reasonable to skip the monitor until she has recurrence

## 2021-07-18 NOTE — Telephone Encounter (Signed)
Spoke with daughter [name]. Daughter explained that patient's fall happened 6 years ago and she has not had any symptoms or recurrent falls. Denies palpitations, lightheadedness that may precede falls.   Daughter said this fall has been worked up in the past, had MRI, etc.   She is asking if monitor is needed.   Routed to [place], [place]

## 2021-07-18 NOTE — Telephone Encounter (Signed)
  Pt's daughter calling, she said, when pt was seen on 0/28 she told the provider that she feel and a heart monitor ordered for her. Daughter said, the pt's fall happened few years back and never happen again. She wanted to verify if pt still need the heart monitor

## 2021-07-18 NOTE — Telephone Encounter (Signed)
Daughter [name] notified of PA advice. They will mail monitor back once it arrives  Message routed to Shelly/Katy to cancel out monitor order

## 2021-07-21 ENCOUNTER — Other Ambulatory Visit (HOSPITAL_BASED_OUTPATIENT_CLINIC_OR_DEPARTMENT_OTHER): Payer: Self-pay

## 2021-07-21 ENCOUNTER — Emergency Department (HOSPITAL_BASED_OUTPATIENT_CLINIC_OR_DEPARTMENT_OTHER): Payer: Medicare Other

## 2021-07-21 ENCOUNTER — Encounter (HOSPITAL_BASED_OUTPATIENT_CLINIC_OR_DEPARTMENT_OTHER): Payer: Self-pay | Admitting: Obstetrics and Gynecology

## 2021-07-21 ENCOUNTER — Emergency Department (HOSPITAL_BASED_OUTPATIENT_CLINIC_OR_DEPARTMENT_OTHER)
Admission: EM | Admit: 2021-07-21 | Discharge: 2021-07-21 | Disposition: A | Discharge status: Home / Self Care | Payer: Medicare Other | Attending: Emergency Medicine | Admitting: Emergency Medicine

## 2021-07-21 ENCOUNTER — Telehealth: Payer: Self-pay | Admitting: Family Medicine

## 2021-07-21 ENCOUNTER — Emergency Department (HOSPITAL_BASED_OUTPATIENT_CLINIC_OR_DEPARTMENT_OTHER): Payer: Medicare Other | Admitting: Radiology

## 2021-07-21 ENCOUNTER — Other Ambulatory Visit: Payer: Self-pay

## 2021-07-21 DIAGNOSIS — S300XXA Contusion of lower back and pelvis, initial encounter: Secondary | ICD-10-CM | POA: Diagnosis not present

## 2021-07-21 DIAGNOSIS — S060X0A Concussion without loss of consciousness, initial encounter: Secondary | ICD-10-CM | POA: Diagnosis not present

## 2021-07-21 DIAGNOSIS — S20221A Contusion of right back wall of thorax, initial encounter: Secondary | ICD-10-CM

## 2021-07-21 DIAGNOSIS — S0990XA Unspecified injury of head, initial encounter: Secondary | ICD-10-CM | POA: Diagnosis present

## 2021-07-21 DIAGNOSIS — W182XXA Fall in (into) shower or empty bathtub, initial encounter: Secondary | ICD-10-CM | POA: Diagnosis not present

## 2021-07-21 DIAGNOSIS — S060XAA Concussion with loss of consciousness status unknown, initial encounter: Secondary | ICD-10-CM

## 2021-07-21 DIAGNOSIS — Z96642 Presence of left artificial hip joint: Secondary | ICD-10-CM | POA: Diagnosis not present

## 2021-07-21 DIAGNOSIS — R519 Headache, unspecified: Secondary | ICD-10-CM | POA: Diagnosis not present

## 2021-07-21 LAB — URINALYSIS, ROUTINE W REFLEX MICROSCOPIC
Bilirubin Urine: NEGATIVE
Glucose, UA: NEGATIVE mg/dL
Hgb urine dipstick: NEGATIVE
Ketones, ur: NEGATIVE mg/dL
Leukocytes,Ua: NEGATIVE
Nitrite: NEGATIVE
Protein, ur: NEGATIVE mg/dL
Specific Gravity, Urine: 1.005 (ref 1.005–1.030)
pH: 6.5 (ref 5.0–8.0)

## 2021-07-21 LAB — CBC WITH DIFFERENTIAL/PLATELET
Abs Immature Granulocytes: 0.02 10*3/uL (ref 0.00–0.07)
Basophils Absolute: 0 10*3/uL (ref 0.0–0.1)
Basophils Relative: 1 %
Eosinophils Absolute: 0.3 10*3/uL (ref 0.0–0.5)
Eosinophils Relative: 4 %
HCT: 36.5 % (ref 36.0–46.0)
Hemoglobin: 12.4 g/dL (ref 12.0–15.0)
Immature Granulocytes: 0 %
Lymphocytes Relative: 34 %
Lymphs Abs: 2.5 10*3/uL (ref 0.7–4.0)
MCH: 32.8 pg (ref 26.0–34.0)
MCHC: 34 g/dL (ref 30.0–36.0)
MCV: 96.6 fL (ref 80.0–100.0)
Monocytes Absolute: 0.7 10*3/uL (ref 0.1–1.0)
Monocytes Relative: 10 %
Neutro Abs: 3.8 10*3/uL (ref 1.7–7.7)
Neutrophils Relative %: 51 %
Platelets: 226 10*3/uL (ref 150–400)
RBC: 3.78 MIL/uL — ABNORMAL LOW (ref 3.87–5.11)
RDW: 12.7 % (ref 11.5–15.5)
WBC: 7.3 10*3/uL (ref 4.0–10.5)
nRBC: 0 % (ref 0.0–0.2)

## 2021-07-21 LAB — COMPREHENSIVE METABOLIC PANEL
ALT: 14 U/L (ref 0–44)
AST: 17 U/L (ref 15–41)
Albumin: 4.4 g/dL (ref 3.5–5.0)
Alkaline Phosphatase: 57 U/L (ref 38–126)
Anion gap: 12 (ref 5–15)
BUN: 15 mg/dL (ref 8–23)
CO2: 22 mmol/L (ref 22–32)
Calcium: 9.9 mg/dL (ref 8.9–10.3)
Chloride: 102 mmol/L (ref 98–111)
Creatinine, Ser: 0.95 mg/dL (ref 0.44–1.00)
GFR, Estimated: >60 mL/min (ref >60)
Glucose, Bld: 99 mg/dL (ref 70–99)
Potassium: 4.5 mmol/L (ref 3.5–5.1)
Sodium: 136 mmol/L (ref 135–145)
Total Bilirubin: 0.5 mg/dL (ref 0.3–1.2)
Total Protein: 7.1 g/dL (ref 6.5–8.1)

## 2021-07-21 MED ORDER — ACETAMINOPHEN 500 MG PO TABS
500.0000 mg | ORAL_TABLET | Freq: Four times a day (QID) | ORAL | 0 refills | Status: AC | PRN
  Filled 2021-07-21: qty 30, 8d supply, fill #0

## 2021-07-21 MED ORDER — ACETAMINOPHEN 325 MG PO TABS
650.0000 mg | ORAL_TABLET | Freq: Once | ORAL | Status: AC
Start: 2021-07-21 — End: 2021-07-21
  Administered 2021-07-21: 650 mg via ORAL
  Filled 2021-07-21: qty 2

## 2021-07-21 MED ORDER — ONDANSETRON 8 MG PO TBDP
8.0000 mg | ORAL_TABLET | Freq: Three times a day (TID) | ORAL | 0 refills | Status: DC | PRN
  Filled 2021-07-21: qty 20, 7d supply, fill #0

## 2021-07-21 MED ORDER — TIZANIDINE HCL 2 MG PO TABS
4.0000 mg | ORAL_TABLET | Freq: Once | ORAL | Status: AC
  Administered 2021-07-21: 4 mg via ORAL
  Filled 2021-07-21: qty 2

## 2021-07-21 MED ORDER — NAPROXEN 375 MG PO TABS
375.0000 mg | ORAL_TABLET | Freq: Two times a day (BID) | ORAL | 0 refills | Status: DC
  Filled 2021-07-21: qty 10, 5d supply, fill #0

## 2021-07-21 MED ORDER — LIDOCAINE 4 % EX PTCH
1.0000 | MEDICATED_PATCH | Freq: Two times a day (BID) | CUTANEOUS | 0 refills | Status: DC
  Filled 2021-07-21: qty 10, 5d supply, fill #0

## 2021-07-21 MED ORDER — LIDOCAINE 5 % EX PTCH
1.0000 | MEDICATED_PATCH | CUTANEOUS | Status: DC
  Administered 2021-07-21: 1 via TRANSDERMAL
  Filled 2021-07-21: qty 1

## 2021-07-21 MED ORDER — TIZANIDINE HCL 4 MG PO TABS
4.0000 mg | ORAL_TABLET | Freq: Three times a day (TID) | ORAL | 0 refills | Status: DC | PRN
  Filled 2021-07-21: qty 30, 10d supply, fill #0

## 2021-07-21 MED ORDER — NAPROXEN 250 MG PO TABS
375.0000 mg | ORAL_TABLET | Freq: Once | ORAL | Status: AC
  Administered 2021-07-21: 375 mg via ORAL
  Filled 2021-07-21: qty 2

## 2021-07-21 NOTE — Telephone Encounter (Signed)
Pt states: -[place] five days ago -hit back and hit head -experiencing pain in the back, cannot get out of bed easily -experiencing headaches and nausea -vision has not changed.  Pt referred to PCP Triage Nurse, awaiting follow up notes. Kennyth Lose

## 2021-07-21 NOTE — ED Notes (Signed)
Patient transported to X-ray 

## 2021-07-21 NOTE — Discharge Instructions (Addendum)
The CT scan of the brain, the blood work and the x-ray of your pelvis are reassuring.  We suspect that you likely have a concussion that is mild and contusion or bruise of the soft tissue or bony pelvic region in the back.  Treatment will be conservative with supportive care.  As discussed, warm compresses will be very helpful.  Take the medications prescribed and follow-up with your primary care doctor in 5 to 7 days.

## 2021-07-21 NOTE — ED Triage Notes (Signed)
Patient reports x5 days ago she slipped and fell in the shower. Patient reports head pain and middle back pain, denies neck pain or tenderness. Denies hip pain and is ambulatory in triage. Denies blood thinning medications.

## 2021-07-21 NOTE — ED Notes (Addendum)
During triage, patient reports some passive thoughts of depression including thoughts of wishing to "not be here". She denies any plans of self harm and denies ever attempting to harm herself. She states she just feels like she would rather not exist. Patient's family was in the room at the time of patient's comments.

## 2021-07-21 NOTE — Telephone Encounter (Signed)
Patient Name: Shari Horn 8 N Gender: Female DOB: 10/23/1940 Age: 81 Y 62 M 27 D Return Phone Number: [phone] (Primary) Address: City/ State/ Zip: [place]  [address] Client [place] [place] Client Site [facility] Day Provider Dimas Chyle- MD Contact Type Call Who Is Calling Patient / Member / Family / Caregiver Call Type Triage / Clinical Relationship To Patient Self Return Phone Number [phone] (Primary) Chief Complaint Headache Reason for Call Symptomatic / Request for [name] states she fell a few days ago ion the shower. She hit her back and head. She has headache, nausea, and excruciating pain in mid back. Additional Comment Transferred from the office Translation No Nurse Assessment Nurse: Thad Ranger, RN, Langley Gauss Date/Time (Eastern Time): 07/21/2021 8:18:46 AM Confirm and document reason for call. If symptomatic, describe symptoms. ---Caller states she fell a few days ago ion the shower. She hit her back and head. She has headache, nausea, and excruciating pain in mid back. Does the patient have any new or worsening symptoms? ---Yes Will a triage be completed? ---Yes Related visit to physician within the last 2 weeks? ---No Does the PT have any chronic conditions? (i.e. diabetes, asthma, this includes High risk factors for pregnancy, etc.) ---No Is this a behavioral health or substance abuse call? ---No Guidelines Guideline Title Affirmed Question Affirmed Notes Nurse Date/Time (Eastern Time) Back Injury [1] SEVERE PAIN in kidney area (flank) AND [2] follows direct blow to that site [place], RN, Denise 07/21/2021 8:19:02 AM  Disp. Time Eilene Ghazi Time) Disposition Final User 07/21/2021 8:21:38 AM Go to ED Now Yes Thad Ranger, RN, Langley Gauss Final Disposition 07/21/2021 8:21:38 AM Go to ED Now Yes Carmon, RN, Yevette Edwards Disagree/Comply Comply Caller Understands  Yes PreDisposition Call Doctor Care Advice Given Per Guideline GO TO ED NOW: CARE ADVICE given per Back Injury (Adult) guideline. Comments User: Romeo Apple, RN Date/Time Eilene Ghazi Time): 07/21/2021 8:21:37 AM Have another adult and take list of all meds Referrals [facility] - ED

## 2021-07-21 NOTE — Telephone Encounter (Signed)
FYI

## 2021-07-21 NOTE — ED Provider Notes (Signed)
[facility] EMERGENCY DEPT Provider Note   CSN: [id number] Arrival date & time: 07/21/21  7124     History  Chief Complaint  Patient presents with   Fall   Head Injury        Back Pain    Shari Horn is a 81 y.o. female.  HPI     81 year old female comes in with chief complaint of fall and resultant headache and back pain.  Patient states that she fell 5 days ago.  Patient first fell in her bathtub, striking the back of her head.  Thereafter she fell again when she got outside of the bathtub, falling backwards again and striking her flank area.  She is having pain over her head ever since.  Headaches are intermittently present, but they have been present every day.  She also has associated nausea without vomiting.  Patient has had increasing pain over her flank region and lower back.  She has history of left hip replacement.  Currently the pain is primarily over the right flank region and right lower back.  Pain is worse with any activity, specifically with her trying to get up.  She has taken Tylenol for the last few days without any significant relief.  Home Medications Prior to Admission medications   Medication Sig Start Date End Date Taking? Authorizing Provider  acetaminophen (TYLENOL) 500 MG tablet Take 1 tablet (500 mg total) by mouth every 6 (six) hours as needed. 07/21/21  Yes Fae Blossom, MD  lidocaine 4 % Place 1 patch onto the skin 2 (two) times daily. 07/21/21  Yes Aerilyn Slee, MD  naproxen (NAPROSYN) 375 MG tablet Take 1 tablet (375 mg total) by mouth 2 (two) times daily. 07/21/21  Yes Varney Biles, MD  tiZANidine (ZANAFLEX) 4 MG tablet Take 1 tablet (4 mg total) by mouth every 8 (eight) hours as needed for muscle spasms. 07/21/21  Yes Archibald Marchetta, MD  azelastine (ASTELIN) 0.1 % nasal spray Place 2 sprays into both nostrils 2 (two) times daily. Patient not taking: Reported on 04/30/2021 08/27/20   Vivi Barrack, MD  buPROPion (WELLBUTRIN XL) 150  MG 24 hr tablet Take 1 tablet (150 mg total) by mouth daily. 04/30/21   Vivi Barrack, MD  cholecalciferol (VITAMIN D3) 25 MCG (1000 UT) tablet Take 2,000 Units by mouth daily.    [provider]  ciprofloxacin-dexamethasone (CIPRODEX) OTIC suspension Place 4 drops into both ears 2 (two) times daily. 03/11/21   Vivi Barrack, MD  clonazePAM (KLONOPIN) 0.5 MG tablet Take 1 tablet (0.5 mg total) by mouth 3 (three) times daily. 06/18/21   Vivi Barrack, MD  ezetimibe (ZETIA) 10 MG tablet TAKE 1 TABLET BY MOUTH DAILY 09/17/20   Pixie Casino, MD  Fluocinolone Acetonide Body 0.01 % OIL Apply to affected area qd 06/13/20   Robyne Askew R, PA-C  FLUoxetine (PROZAC) 20 MG capsule TAKE 3 CAPSULES(60 MG) BY MOUTH DAILY 07/09/21   Vivi Barrack, MD  lamoTRIgine (LAMICTAL) 25 MG tablet TAKE 1 TABLET(25 MG) BY MOUTH DAILY 04/11/21   Vivi Barrack, MD  levothyroxine (SYNTHROID) 75 MCG tablet TAKE 1 TABLET(75 MCG) BY MOUTH DAILY 04/24/21   Vivi Barrack, MD  meclizine (ANTIVERT) 25 MG tablet Take 1 tablet (25 mg total) by mouth 3 (three) times daily as needed for dizziness. 09/30/17   Orma Flaming, MD  Multiple Vitamin (MULTIVITAMIN WITH MINERALS) TABS tablet Take 1 tablet by mouth daily.    [provider]  pantoprazole (PROTONIX) 40 MG tablet TAKE 1 TABLET(40 MG) BY MOUTH DAILY 11/23/18   Vivi Barrack, MD  prednisoLONE acetate (PRED FORTE) 1 % ophthalmic suspension  11/08/19   [provider]  QUEtiapine (SEROQUEL) 300 MG tablet Take 1 tablet (300 mg total) by mouth at bedtime. 06/18/21   Vivi Barrack, MD  rosuvastatin (CRESTOR) 40 MG tablet Take 1 tablet (40 mg total) by mouth daily. Appointment with cardiologist needed for further refills 07/08/21   Pixie Casino, MD  senna (SENOKOT) 8.6 MG TABS tablet Take 5 tablets by mouth at bedtime.     [provider]      Allergies    Codeine    Review of Systems   Review of Systems  All other systems reviewed  and are negative.   Physical Exam Updated Vital Signs BP 116/61 (BP Location: Right Arm)   Pulse 89   Temp 97.7 F (36.5 C) (Oral)   Resp 17   Ht '5\' 7"'$  (1.702 m)   Wt 79.4 kg   LMP  (LMP Unknown)   SpO2 97%   BMI 27.41 kg/m  Physical Exam Vitals and nursing note reviewed.  Constitutional:      Appearance: She is well-developed.  HENT:     Head: Normocephalic and atraumatic.  Eyes:     Extraocular Movements: Extraocular movements intact.     Pupils: Pupils are equal, round, and reactive to light.  Neck:     Comments: No midline c-spine tenderness Cardiovascular:     Rate and Rhythm: Normal rate and regular rhythm.     Heart sounds: No murmur heard. Pulmonary:     Effort: Pulmonary effort is normal. No respiratory distress.     Breath sounds: Normal breath sounds.  Chest:     Chest wall: No tenderness.  Abdominal:     General: Bowel sounds are normal. There is no distension.     Palpations: Abdomen is soft.     Tenderness: There is no abdominal tenderness.  Musculoskeletal:        General: No deformity.     Cervical back: Neck supple. No tenderness.     Comments: Patient has tenderness over the paraspinal region over the right back and in the right flank region.  No tenderness over the right posterior ribs  No long bone tenderness - upper and lower extrmeities and no pelvic pain, instability. Patient able to ambulate  Skin:    General: Skin is warm and dry.     Findings: No rash.  Neurological:     Mental Status: She is alert and oriented to person, place, and time.     Cranial Nerves: No cranial nerve deficit.     Sensory: No sensory deficit.     Motor: No weakness.     Coordination: Coordination normal.     Gait: Gait abnormal.     ED Results / Procedures / Treatments   Labs (all labs ordered are listed, but only abnormal results are displayed) Labs Reviewed  CBC WITH DIFFERENTIAL/PLATELET - Abnormal; Notable for the following components:      Result  Value   RBC 3.78 (*)    All other components within normal limits  URINALYSIS, ROUTINE W REFLEX MICROSCOPIC  COMPREHENSIVE METABOLIC PANEL    EKG None  Radiology CT Head Wo Contrast  Result Date: 07/21/2021 CLINICAL DATA:  Head trauma, slipped and fall 5 days ago.  Headache. EXAM: CT HEAD WITHOUT CONTRAST TECHNIQUE: Contiguous axial images were obtained from  the base of the skull through the vertex without intravenous contrast. RADIATION DOSE REDUCTION: This exam was performed according to the departmental dose-optimization program which includes automated exposure control, adjustment of the mA and/or kV according to patient size and/or use of iterative reconstruction technique. COMPARISON:  MRI examination dated May 13, 2019 FINDINGS: Brain: No evidence of acute infarction, hemorrhage, hydrocephalus, extra-axial collection or mass lesion/mass effect. Low-attenuation of the periventricular white matter presumed chronic microvascular ischemic changes. Vascular: No hyperdense vessel or unexpected calcification. Skull: Normal. Negative for fracture or focal lesion. Sinuses/Orbits: No acute finding. Other: None. IMPRESSION: 1.  No acute intracranial abnormality. 2. Mild cerebral volume loss and chronic microvascular ischemic changes of the white matter, unchanged. Electronically Signed   By: Keane Police D.O.   On: 07/21/2021 11:01   DG Pelvis 1-2 Views  Result Date: 07/21/2021 CLINICAL DATA:  Patient reports fall a few days ago with increasing back pain. EXAM: PELVIS - 1-2 VIEW COMPARISON:  Pelvic radiograph 12/12/2017; CT pelvis 12/13/2018. FINDINGS: Of note, patient is mildly rotated. There is no evidence of pelvic fracture or diastasis. No pelvic bone lesions are seen. Left hip prosthesis is intact without evidence of complication. 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 Mild leftward curvature of the visualized lower lumbar spine. Multilevel degenerative disc disease noted in the visualized  lower lumbar spine, most severe at L4-L5, unchanged compared to 12/12/2017. IMPRESSION: No acute abnormality. 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 Electronically Signed   By: Ileana Roup M.D.   On: 07/21/2021 11:01    Procedures Procedures    Medications Ordered in ED Medications  lidocaine (LIDODERM) 5 % 1 patch (1 patch Transdermal Patch Applied 07/21/21 1026)  naproxen (NAPROSYN) tablet 375 mg (375 mg Oral Given 07/21/21 1022)  tiZANidine (ZANAFLEX) tablet 4 mg (4 mg Oral Given 07/21/21 1023)  acetaminophen (TYLENOL) tablet 650 mg (650 mg Oral Given 07/21/21 1022)    ED Course/ Medical Decision Making/ A&P                           Medical Decision Making Amount and/or Complexity of Data Reviewed Labs: ordered. Radiology: ordered.  Risk OTC drugs. Prescription drug management.   This patient presents to the ED with chief complaint(s) of flank pain, headaches, nausea with pertinent past medical history of 2 mechanical falls that occurred 5 days ago which further complicates the presenting complaint.   The differential diagnosis includes contusion to the back, muscle spasms, soft tissue contusion, hematoma, hip contusion, concussion, subdural hematoma.  The initial plan is to get CT scan of the brain.  With the headaches that are lingering with nausea, given her age we will proceed with CT brain to make sure there is no subdural hematoma. We will also get x-ray of the pelvis to make sure there is no occult pelvic fracture that we can see.  Patient is able to ambulate, which is reassuring.  She also has reproducible musculoskeletal pain in the right lower back region, which is more indicative of soft tissue pain.  There is no evidence of ecchymosis and no peritoneal findings.  I do not think CT of her abdomen pelvis is needed, as the risk of clinically significant injury to her internal organs is quite  low 5 days out.  We will get CBC to make sure her hemoglobin is stable and there is no large internal hematoma  to worry about.  Also make sure her urine analysis and liver function test look normal.  Independent labs interpretation:  The following labs were independently interpreted: CBC with normal hemoglobin, UA with no hematuria  Independent visualization of imaging: - I independently visualized the following imaging with scope of interpretation limited to determining acute life threatening conditions related to emergency care: CT scan of the brain, which revealed no evidence of brain bleed  Treatment and Reassessment: Patient reassessed.  Results discussed with her.  She is stable for discharge now. Advised follow-up with her PCP in 1 week.  Also recommended warm compresses, stretching exercises and aggressive management of her symptoms with the medications that are prescribed.   Final Clinical Impression(s) / ED Diagnoses Final diagnoses:  Concussion with unknown loss of consciousness status, initial encounter  Contusion of right side of back, initial encounter    Rx / DC Orders ED Discharge Orders          Ordered    naproxen (NAPROSYN) 375 MG tablet  2 times daily        07/21/21 1135    acetaminophen (TYLENOL) 500 MG tablet  Every 6 hours PRN        07/21/21 1135    tiZANidine (ZANAFLEX) 4 MG tablet  Every 8 hours PRN        07/21/21 1135    lidocaine 4 %  2 times daily        07/21/21 Lillian, Joachim Carton, MD 07/21/21 1139

## 2021-07-21 NOTE — ED Notes (Signed)
Pt had drank could water. Waited 10 min and re took temp

## 2021-07-25 ENCOUNTER — Other Ambulatory Visit (HOSPITAL_BASED_OUTPATIENT_CLINIC_OR_DEPARTMENT_OTHER): Payer: Self-pay

## 2021-07-26 ENCOUNTER — Other Ambulatory Visit: Payer: Self-pay

## 2021-07-26 DIAGNOSIS — K5900 Constipation, unspecified: Secondary | ICD-10-CM | POA: Insufficient documentation

## 2021-07-26 DIAGNOSIS — E039 Hypothyroidism, unspecified: Secondary | ICD-10-CM | POA: Diagnosis not present

## 2021-07-26 DIAGNOSIS — R1084 Generalized abdominal pain: Secondary | ICD-10-CM | POA: Diagnosis present

## 2021-07-26 NOTE — ED Triage Notes (Signed)
Pt c/o  abd pain starting today. Pt reports taking 5 ducolax last night, 5 more this morning. Took 60 mg ducolax liquid 1500 today. C/o constipation, had hard, small BM today.  C/o intermittent nausea today.   Denies difficulty with urination

## 2021-07-27 ENCOUNTER — Emergency Department (HOSPITAL_BASED_OUTPATIENT_CLINIC_OR_DEPARTMENT_OTHER): Payer: Medicare Other

## 2021-07-27 ENCOUNTER — Emergency Department (HOSPITAL_BASED_OUTPATIENT_CLINIC_OR_DEPARTMENT_OTHER)
Admission: EM | Admit: 2021-07-27 | Discharge: 2021-07-27 | Disposition: A | Discharge status: Home / Self Care | Payer: Medicare Other | Attending: Emergency Medicine | Admitting: Emergency Medicine

## 2021-07-27 ENCOUNTER — Emergency Department (HOSPITAL_BASED_OUTPATIENT_CLINIC_OR_DEPARTMENT_OTHER): Payer: Medicare Other | Admitting: Radiology

## 2021-07-27 DIAGNOSIS — I700 Atherosclerosis of aorta: Secondary | ICD-10-CM | POA: Diagnosis not present

## 2021-07-27 DIAGNOSIS — R109 Unspecified abdominal pain: Secondary | ICD-10-CM | POA: Diagnosis not present

## 2021-07-27 DIAGNOSIS — K5900 Constipation, unspecified: Secondary | ICD-10-CM

## 2021-07-27 DIAGNOSIS — R1084 Generalized abdominal pain: Secondary | ICD-10-CM

## 2021-07-27 LAB — URINALYSIS, ROUTINE W REFLEX MICROSCOPIC
Bilirubin Urine: NEGATIVE
Glucose, UA: NEGATIVE mg/dL
Hgb urine dipstick: NEGATIVE
Ketones, ur: NEGATIVE mg/dL
Leukocytes,Ua: NEGATIVE
Nitrite: NEGATIVE
Specific Gravity, Urine: 1.038 — ABNORMAL HIGH (ref 1.005–1.030)
pH: 8 (ref 5.0–8.0)

## 2021-07-27 LAB — CBC
HCT: 41 % (ref 36.0–46.0)
Hemoglobin: 14.1 g/dL (ref 12.0–15.0)
MCH: 32.8 pg (ref 26.0–34.0)
MCHC: 34.4 g/dL (ref 30.0–36.0)
MCV: 95.3 fL (ref 80.0–100.0)
Platelets: 299 10*3/uL (ref 150–400)
RBC: 4.3 MIL/uL (ref 3.87–5.11)
RDW: 12.7 % (ref 11.5–15.5)
WBC: 11.4 10*3/uL — ABNORMAL HIGH (ref 4.0–10.5)
nRBC: 0 % (ref 0.0–0.2)

## 2021-07-27 LAB — COMPREHENSIVE METABOLIC PANEL
ALT: 19 U/L (ref 0–44)
AST: 26 U/L (ref 15–41)
Albumin: 4.9 g/dL (ref 3.5–5.0)
Alkaline Phosphatase: 93 U/L (ref 38–126)
Anion gap: 11 (ref 5–15)
BUN: 12 mg/dL (ref 8–23)
CO2: 27 mmol/L (ref 22–32)
Calcium: 10.4 mg/dL — ABNORMAL HIGH (ref 8.9–10.3)
Chloride: 99 mmol/L (ref 98–111)
Creatinine, Ser: 1.17 mg/dL — ABNORMAL HIGH (ref 0.44–1.00)
GFR, Estimated: 47 mL/min — ABNORMAL LOW (ref >60)
Glucose, Bld: 125 mg/dL — ABNORMAL HIGH (ref 70–99)
Potassium: 3.7 mmol/L (ref 3.5–5.1)
Sodium: 137 mmol/L (ref 135–145)
Total Bilirubin: 0.5 mg/dL (ref 0.3–1.2)
Total Protein: 7.8 g/dL (ref 6.5–8.1)

## 2021-07-27 LAB — LIPASE, BLOOD: Lipase: 23 U/L (ref 11–51)

## 2021-07-27 MED ORDER — SODIUM CHLORIDE 0.9 % IV BOLUS
500.0000 mL | Freq: Once | INTRAVENOUS | Status: AC
  Administered 2021-07-27: 500 mL via INTRAVENOUS

## 2021-07-27 MED ORDER — SENNOSIDES-DOCUSATE SODIUM 8.6-50 MG PO TABS: 1.0000 | ORAL_TABLET | Freq: Every evening | ORAL | 0 refills | Status: AC | PRN

## 2021-07-27 MED ORDER — IOHEXOL 300 MG/ML  SOLN
100.0000 mL | Freq: Once | INTRAMUSCULAR | Status: AC | PRN
  Administered 2021-07-27: 50 mL via INTRAVENOUS

## 2021-07-27 MED ORDER — POLYETHYLENE GLYCOL 3350 17 G PO PACK: 17.0000 g | PACK | Freq: Every day | ORAL | 0 refills | Status: AC | PRN

## 2021-07-27 NOTE — Discharge Instructions (Signed)
You were seen in the emergency room today with abdominal pain.  You have mild constipation on your CT scan but no other acute findings.  I am starting on different medications to help with your constipation at home.  You may also use over-the-counter suppositories if you wish.  Return with any new or suddenly worsening symptoms.

## 2021-07-27 NOTE — ED Provider Notes (Signed)
Emergency Department Provider Note   I have reviewed the triage vital signs and the nursing notes.   HISTORY  Chief Complaint Abdominal Pain and Constipation   HPI Shari Horn is a 81 y.o. female to the emergency department for evaluation of abdominal discomfort.  Patient states she is developed diffuse abdominal discomfort along with constipation over the past 3 days.  Her last BM was 3 days prior.  She is passing flatus.  No fevers or chills.  No vomiting.  She did have a mechanical fall and was evaluated in the ED on 7/3.  She has not been taking opiate pain medications.  Overall her symptoms from the fall are improving.  She has not seen blood in the bowel movement that she is having and no severe rectal pain. Patient has been taking OTC laxatives without relief.    Past Medical History:  Diagnosis Date   Carotid artery disease ([place])    Depression    Dyslipidemia    Hyperlipidemia    Hypothyroidism    Mood disorder (HCC)    BPD   PVC's (premature ventricular contractions)     Review of Systems  Constitutional: No fever/chills Eyes: No visual changes. ENT: No sore throat. Cardiovascular: Denies chest pain. Respiratory: Denies shortness of breath. Gastrointestinal: Positive abdominal pain.  No nausea, no vomiting.  No diarrhea.  Positive constipation. Genitourinary: Negative for dysuria. Musculoskeletal: Negative for back pain. Skin: Negative for rash. Neurological: Negative for headaches, focal weakness or numbness.  ____________________________________________   PHYSICAL EXAM:  VITAL SIGNS: ED Triage Vitals  Enc Vitals Group     BP 07/26/21 2249 (!) 149/82     Pulse Rate 07/26/21 2249 87     Resp 07/26/21 2249 20     Temp 07/26/21 2249 98 F (36.7 C)     Temp src --      SpO2 07/26/21 2249 96 %   Constitutional: Alert and oriented. Well appearing and in no acute distress. Eyes: Conjunctivae are normal.  Head: Atraumatic. Nose: No  congestion/rhinnorhea. Mouth/Throat: Mucous membranes are moist.   Neck: No stridor.   Cardiovascular: Normal rate, regular rhythm. Good peripheral circulation. Grossly normal heart sounds.   Respiratory: Normal respiratory effort.  No retractions. Lungs CTAB. Gastrointestinal: Soft with mild diffuse tenderness, seemingly worse on the right.  No peritonitis. No distention.  Musculoskeletal: No lower extremity tenderness nor edema. No gross deformities of extremities. Neurologic:  Normal speech and language. No gross focal neurologic deficits are appreciated.  Skin:  Skin is warm, dry and intact. No rash noted.   ____________________________________________   LABS (all labs ordered are listed, but only abnormal results are displayed)  Labs Reviewed  LIPASE, BLOOD  COMPREHENSIVE METABOLIC PANEL  CBC  URINALYSIS, ROUTINE W REFLEX MICROSCOPIC   ____________________________________________  EKG   EKG Interpretation  Date/Time:  Saturday July 26 2021 23:03:19 EDT Ventricular Rate:  82 PR Interval:  164 QRS Duration: 76 QT Interval:  410 QTC Calculation: 479 R Axis:   8 Text Interpretation: Normal sinus rhythm Normal ECG When compared with ECG of 18-Oct-2018 05:30, PREVIOUS ECG IS PRESENT Confirmed by Nanda Quinton [phone]) on 07/27/2021 2:04:45 AM        ____________________________________________  RADIOLOGY  No results found.  ____________________________________________   PROCEDURES  Procedure(s) performed:   Procedures   ____________________________________________   INITIAL IMPRESSION / ASSESSMENT AND PLAN / ED COURSE  Pertinent labs & imaging results that were available during my care of the patient were reviewed by  me and considered in my medical decision making (see chart for details).   This patient is Presenting for Evaluation of abdominal pain, which does require a range of treatment options, and is a complaint that involves a high risk of morbidity  and mortality.  The Differential Diagnoses  includes but is not exclusive to acute cholecystitis, intrathoracic causes for epigastric abdominal pain, gastritis, duodenitis, pancreatitis, small bowel or large bowel obstruction, abdominal aortic aneurysm, hernia, gastritis, etc.   Critical Interventions-    Medications  sodium chloride 0.9 % bolus 500 mL (has no administration in time range)    Reassessment after intervention:     I decided to review pertinent External Data, and in summary ED visit from 07/21/21 reviewed.   Clinical Laboratory Tests Ordered, included ***  Radiologic Tests Ordered, included CT abdomen/pelvis. I independently interpreted the images and agree with radiology interpretation.    Social Determinants of Health Risk patient is a fall risk and prior smoker.   Consult complete with  Medical Decision Making: Summary:  Patient presents to the emergency department for evaluation of abdominal pain with constipation.  Lower suspicion for obstruction or acute surgical process in the abdomen but given the patient's age and tenderness on exam, worse on the right, do plan for CT imaging of the abdomen and pelvis along with labs, IV fluids, reassess.  Reevaluation with update and discussion with   ***Considered admission***  Disposition:   ____________________________________________  FINAL CLINICAL IMPRESSION(S) / ED DIAGNOSES  Final diagnoses:  None     NEW OUTPATIENT MEDICATIONS STARTED DURING THIS VISIT:  New Prescriptions   No medications on file    Note:  This document was prepared using Dragon voice recognition software and may include unintentional dictation errors.  Nanda Quinton, MD, [facility] Emergency Medicine

## 2021-07-29 ENCOUNTER — Other Ambulatory Visit (HOSPITAL_BASED_OUTPATIENT_CLINIC_OR_DEPARTMENT_OTHER): Payer: Self-pay

## 2021-07-29 NOTE — ED Notes (Signed)
Son called regarding her dc prescription. I informed him they were OTC medications

## 2021-07-30 ENCOUNTER — Telehealth: Payer: Self-pay | Admitting: Family Medicine

## 2021-07-30 ENCOUNTER — Other Ambulatory Visit (HOSPITAL_BASED_OUTPATIENT_CLINIC_OR_DEPARTMENT_OTHER): Payer: Self-pay

## 2021-07-30 NOTE — Telephone Encounter (Signed)
Spoke with patient sched AWV she req a CB 08/11/21

## 2021-07-31 ENCOUNTER — Telehealth: Payer: Self-pay | Admitting: Family Medicine

## 2021-07-31 ENCOUNTER — Ambulatory Visit: Payer: Medicare Other | Admitting: Family Medicine

## 2021-07-31 NOTE — Telephone Encounter (Signed)
Patient called to cancel ED follow up visit with Dr. Jerline Pain on 07/31/21 at 2:40pm due to not feeling well enough to leave her home. I offered a new appointment date of 08/04/21 at 1pm which the patient agreed upon, but unfortunately she turned out to have another visit on that day at that time. Pt and I were unaware of this prior to hanging up. I did try to call back to straighten this out but the phone provided a "dead tone".

## 2021-08-04 ENCOUNTER — Ambulatory Visit (HOSPITAL_COMMUNITY): Payer: Medicare Other

## 2021-08-08 ENCOUNTER — Encounter: Payer: Self-pay | Admitting: Family Medicine

## 2021-08-08 ENCOUNTER — Ambulatory Visit (INDEPENDENT_AMBULATORY_CARE_PROVIDER_SITE_OTHER): Payer: Medicare Other | Admitting: Family Medicine

## 2021-08-08 VITALS — BP 131/82 | HR 71 | Temp 98.0°F | Ht 67.0 in | Wt 181.2 lb

## 2021-08-08 DIAGNOSIS — M545 Low back pain, unspecified: Secondary | ICD-10-CM | POA: Diagnosis not present

## 2021-08-08 DIAGNOSIS — K219 Gastro-esophageal reflux disease without esophagitis: Secondary | ICD-10-CM | POA: Diagnosis not present

## 2021-08-08 MED ORDER — BACLOFEN 10 MG PO TABS: 5.0000 mg | ORAL_TABLET | Freq: Three times a day (TID) | ORAL | 0 refills | Status: DC

## 2021-08-08 MED ORDER — DICLOFENAC SODIUM 75 MG PO TBEC: 75.0000 mg | DELAYED_RELEASE_TABLET | Freq: Two times a day (BID) | ORAL | 0 refills | Status: DC

## 2021-08-08 NOTE — Assessment & Plan Note (Signed)
Stable on Protonix 40 mg daily.  This will offer GI protection while she is taking NSAIDs for her above back pain.

## 2021-08-08 NOTE — Patient Instructions (Signed)
It was very nice to see you today!  We will start the baclofen and diclofenac.  I will refer you to see a physical therapist.  Please let us know if you do not anything by next week.  Take care, Dr Jerline Pain  PLEASE NOTE:  If you had any lab tests please let us know if you have not heard back within a few days. You may see your results on mychart before we have a chance to review them but we will give you a call once they are reviewed by [place]. If we ordered any referrals today, please let us know if you have not heard from their office within the next week.   Please try these tips to maintain a healthy lifestyle:  Eat at least 3 REAL meals and 1-2 snacks per day.  Aim for no more than 5 hours between eating.  If you eat breakfast, please do so within one hour of getting up.   Each meal should contain half fruits/vegetables, one quarter protein, and one quarter carbs (no bigger than a computer mouse)  Cut down on sweet beverages. This includes juice, soda, and sweet tea.   Drink at least 1 glass of water with each meal and aim for at least 8 glasses per day  Exercise at least 150 minutes every week.

## 2021-08-08 NOTE — Progress Notes (Signed)
   Shari Horn is a 81 y.o. female who presents today for an office visit.  Assessment/Plan:  New/Acute Problems: Low Back Pain No red flags.  Seems to be musculoskeletal based on distribution of pain.  Low suspicion for compression fracture given no midline tenderness.  She did have an abdominal CT scan at her most recent ED visit which did show some stable compression deformities in her T-spine but nothing in her lumbar spine which is where she is tender at today.  We will switch her medications to baclofen and diclofenac to see if this gives her better improvement.  We will also refer her to see a physical therapist.  Also recommend heating pad.  We discussed reasons to return to care.  Abdominal pain Improved with bowel cleanout.  Chronic Problems Addressed Today: Gastroesophageal reflux disease without esophagitis Stable on Protonix 40 mg daily.  This will offer GI protection while she is taking NSAIDs for her above back pain.     Subjective:  HPI:  Patient here for ED follow up.   She went to the ED on 07/21/2021 after falling in her bathtub and falling backwards and striking her head on the bathtub. Right after this she fell again when trying to get out of the tub. She went to the ED a few days afterwards due to persistent headache and back pain.  Got a CT scan there which was negative.  She did also have a x-ray of her pelvis which did not show any fractures.  She was discharged home with a prescription for Zanaflex and naproxen.  Since then symptoms have come and coming down.  She has tried taking ibuprofen and Tylenol which is helped.  She did not feel like the Zanaflex was helpful.  She never tried the naproxen.  She does not have any reported bowel or bladder incontinence.    She went to the ED on 07/27/2021 with abdominal pain. She had labs and CT scan in the ED which showed mild constipation.  Otherwise everything else was normal.  She was started on a bowel regimen with MiraLAX  and Senokot and discharged home.  Her abdominal pain seems to be improving.       Objective:  Physical Exam: BP 131/82   Pulse 71   Temp 98 F (36.7 C) (Temporal)   Ht '5\' 7"'$  (1.702 m)   Wt 181 lb 3.2 oz (82.2 kg)   LMP  (LMP Unknown)   SpO2 98%   BMI 28.38 kg/m   Gen: No acute distress, resting comfortably CV: Regular rate and rhythm with no murmurs appreciated Pulm: Normal work of breathing, clear to auscultation bilaterally with no crackles, wheezes, or rhonchi MSK - Low Back: No deformities.  Tenderness palpation along lower lumbar paraspinal muscles.  No midline tenderness.  Neurovascular intact distally Neuro: Grossly normal, moves all extremities Psych: Normal affect and thought content  Time Spent: 45 minutes of total time was spent on the date of the encounter performing the following actions: chart review prior to seeing the patient including recent ED visits, obtaining history, performing a medically necessary exam, counseling on the treatment plan, placing orders, and documenting in our EHR.        Algis Greenhouse. Jerline Pain, MD 08/08/2021 3:18 PM

## 2021-08-11 ENCOUNTER — Telehealth: Payer: Self-pay | Admitting: Family Medicine

## 2021-08-11 NOTE — Telephone Encounter (Signed)
Copied from [place] [id number]. Topic: Medicare AWV >> Aug 11, 2021 11:26 AM Devoria Glassing wrote: Reason for CRM: Left message for patient to schedule Annual Wellness Visit.  Please schedule with Nurse Health Advisor Charlott Rakes, RN at [facility]. This appt can be telephone or office visit. Please call [phone] ask for [facility]

## 2021-08-15 ENCOUNTER — Ambulatory Visit: Payer: Medicare Other | Admitting: Physical Therapy

## 2021-08-25 ENCOUNTER — Ambulatory Visit: Payer: Medicare Other | Admitting: Physician Assistant

## 2021-08-29 ENCOUNTER — Encounter: Payer: Self-pay | Admitting: Physical Therapy

## 2021-08-29 ENCOUNTER — Ambulatory Visit: Payer: Medicare Other | Admitting: Physical Therapy

## 2021-08-29 DIAGNOSIS — M5459 Other low back pain: Secondary | ICD-10-CM | POA: Diagnosis not present

## 2021-08-29 DIAGNOSIS — M6281 Muscle weakness (generalized): Secondary | ICD-10-CM | POA: Diagnosis not present

## 2021-08-29 DIAGNOSIS — R2689 Other abnormalities of gait and mobility: Secondary | ICD-10-CM | POA: Diagnosis not present

## 2021-08-29 NOTE — Therapy (Signed)
OUTPATIENT PHYSICAL THERAPY EVALUATION   Patient Name: Shari Horn MRN: [id number] DOB:September 30, 1940, 81 y.o., female Today's Date: 08/29/2021   PT End of Session - 08/29/21 1357     Visit Number 1    Number of Visits 16    Date for PT Re-Evaluation 10/24/21    Authorization Type UHC    PT Start Time 1236    PT Stop Time 1325    PT Time Calculation (min) 49 min    Equipment Utilized During Treatment Gait belt    Activity Tolerance Patient tolerated treatment well    Behavior During Therapy WFL for tasks assessed/performed             Past Medical History:  Diagnosis Date   Carotid artery disease ([place])    Depression    Dyslipidemia    Hyperlipidemia    Hypothyroidism    Mood disorder ([place])    BPD   PVC's (premature ventricular contractions)    Past Surgical History:  Procedure Laterality Date   BREAST BIOPSY     BREAST EXCISIONAL BIOPSY Left 1990   BREAST EXCISIONAL BIOPSY Left 1996   Carotid Doppler  12/2011   0-49% RICA stenosis, 62-70% LICA stenosis   TOTAL HIP ARTHROPLASTY Left 12/12/2017   Procedure: TOTAL HIP ARTHROPLASTY ANTERIOR APPROACH;  Surgeon: Rod Can, MD;  Location: [place];  Service: Orthopedics;  Laterality: Left;   TRANSTHORACIC ECHOCARDIOGRAM  12/2011   EF 35-00%, grade 1 diastolic dysfunction; trivial AV regurg; calcified MV annulus   Patient Active Problem List   Diagnosis Date Noted   Chronic back pain 10/22/2020   Forgetfulness 03/10/2017   Unsteady gait 01/25/2017   Gastroesophageal reflux disease without esophagitis 10/12/2016   Osteopenia 09/17/2016   Dysphagia 09/14/2016   Bipolar 2 disorder ([place]) 09/14/2016   Hypothyroidism 09/14/2016   Bilateral carotid artery disease ([place]) 12/20/2012   PVC's (premature ventricular contractions) 12/20/2012   Dyslipidemia 12/20/2012    PCP: Dimas Chyle   REFERRING PROVIDER: Dimas Chyle  REFERRING DIAG: Low back pain   Rationale for Evaluation and Treatment Rehabilitation  THERAPY  DIAG:  Other low back pain  Other abnormalities of gait and mobility  Muscle weakness (generalized)  ONSET DATE:   SUBJECTIVE:                                                                                                                                                                                           SUBJECTIVE STATEMENT:  Previous: Nov 2019 first bad fall- hip fx with resulting THA on L. Then had a fall, fractured pelvis. Then broke ankle.  Recently 07/21/21: [place] in her bathroom  2 times. She was in a different bathroom/tub without grab bars.  Has been in a lot of pain after recent falls. Mentally down because of decreased mobility.  Yesterday- dizzy. Did take muscle relaxer which she thinks causes her to be dizzy.  Pt reports recent testing: with results of: Mild cognitive decline Pt lives alone, 4 steps to enter with 2 hand rails.  Has grab bars in home and bathrooms. Has SPC and rolling walker.  She is not using cane often, and does not use walker at all.  Main complaint is Back pain- did have back surgery 2002. She has had some pain in back, but fall has significantly increased pain in back. States pain in both sides of low back, causing decreased mobility.    PERTINENT HISTORY:  Multiple falls, L THA,   PAIN:  Are you having pain? Yes: NPRS scale: 7/10 Pain location: Bil low back Pain description: sore, painful Aggravating factors: constant pain, worse with standing and walking Relieving factors: laying on her side.    PRECAUTIONS: Fall  WEIGHT BEARING RESTRICTIONS No  FALLS:  Has patient fallen in last 6 months? Yes. Number of falls 2  OCCUPATION:   PLOF: Independent  PATIENT GOALS  decreased pain in back, improve balance, walking,    OBJECTIVE:   DIAGNOSTIC FINDINGS:     COGNITION:  Overall cognitive status: Within functional limits for tasks assessed      PALPATION: Tenderness in R QL region, states pain bilaterally, in lateral upper lumbar  musculature   LUMBAR ROM:   Active  AROM  eval  Flexion WFL  Extension Mild limitation  Right lateral flexion Mild limitation  Left lateral flexion Mild limitation  Right rotation   Left rotation    (Blank rows = not tested)   Hips: WFL, mild limitation for IR/ER on L.   Knees: WFL     LOWER EXTREMITY MMT:    MMT Right eval Left eval  Hip flexion 4- 4-  Hip extension    Hip abduction 4- 4-  Hip adduction    Hip internal rotation    Hip external rotation    Knee flexion 4 4  Knee extension 4 4  Ankle dorsiflexion    Ankle plantarflexion    Ankle inversion    Ankle eversion     (Blank rows = not tested)  LUMBAR SPECIAL TESTS:  Neg SLR   FUNCTIONAL TESTS:  Balance To be tested next visit.    GAIT: Distance walked: 50 ft x 2  Assistive device utilized: None Would benefit from use of cane or walker  Level of assistance: Complete Independence Comments: Slow, cautious, increased step width, inequality of step length, poor balance.     TODAY'S TREATMENT  Ther ex: see below for HEP Education and practice for walking with SPC. 69 ft x 6;    PATIENT EDUCATION:  Education details: PT POC, Exam findings,  Person educated: Patient Education method: Explanation, Demonstration, Tactile cues, and Verbal cues Education comprehension: verbalized understanding, returned demonstration, verbal cues required, tactile cues required, and needs further education   HOME EXERCISE PROGRAM: Access Code: [facility] URL: [email or web address] Date: 08/29/2021 Prepared by: Lyndee Hensen  Exercises - Hooklying Single Knee to Chest  - 2 x daily - 3 reps - 30 hold - Supine Lower Trunk Rotation  - 2 x daily - 10 reps - 5 hold   ASSESSMENT:  CLINICAL IMPRESSION: Pt presents to PT with main complaints of back pain and poor balance. She has increased  pain in back after more recent falls. She has increased pain in back musculature, minimal pain at midline. She has  had decrease in ability for standing, walking, and functional activity due to severity of pain. She also has had a few falls recently with declining balance. She does demonstrate poor balance today, with gait deficits, and need for assistive device to improve safety. Discussed use of cane and/or walker at this time for fall prevention. Plan to test balance next visit. Pt to benefit from skilled PT to improve deficits and pain.    OBJECTIVE IMPAIRMENTS Abnormal gait, decreased activity tolerance, decreased balance, decreased coordination, decreased knowledge of use of DME, decreased mobility, difficulty walking, decreased strength, decreased safety awareness, increased muscle spasms, impaired flexibility, and pain.   ACTIVITY LIMITATIONS carrying, lifting, bending, standing, squatting, stairs, transfers, and locomotion level  PARTICIPATION LIMITATIONS: meal prep, cleaning, laundry, driving, shopping, and community activity  PERSONAL FACTORS  none  are also affecting patient's functional outcome.   REHAB POTENTIAL: Good  CLINICAL DECISION MAKING: Evolving/moderate complexity  EVALUATION COMPLEXITY: Moderate   GOALS: Goals reviewed with patient? Yes  SHORT TERM GOALS: Target date: 09/12/2021  Pt to be independet with iniital HEP  Goal status: INITIAL  2.  Pt to report decreased pain in back to 0-5/10 with activity.  Goal status: INITIAL  3.  Pt to report using SPC for ambulation at least 50 % of the time for walking.  :  Goal status: INITIAL     LONG TERM GOALS: Target date: 10/24/2021  Pt to be independent with final HEP  Goal status: INITIAL  2.  Pt to demo improved strength of hips to at least 4+5 to improve stability and gait/stair ability    Goal status: INITIAL  3.  Pt to report decreased pain in back to 0-4/10 with standing and walking activity.    Goal status: INITIAL  4.  Balance goal TBD  Goal status: INITIAL  5.  Gait goal TBD  Goal status:  INITIAL    PLAN: PT FREQUENCY: 2x/week  PT DURATION: 8 weeks  PLANNED INTERVENTIONS: Therapeutic exercises, Therapeutic activity, Neuromuscular re-education, Balance training, Gait training, Patient/Family education, Self Care, Joint mobilization, Joint manipulation, Stair training, DME instructions, Dry Needling, Electrical stimulation, Spinal manipulation, Spinal mobilization, Cryotherapy, Moist heat, Taping, Traction, Ultrasound, Ionotophoresis '4mg'$ /ml Dexamethasone, Manual therapy, and Re-evaluation.  PLAN FOR NEXT SESSION:    Lyndee Hensen, PT 08/29/2021, 1:58 PM

## 2021-09-04 ENCOUNTER — Ambulatory Visit: Payer: Medicare Other | Admitting: Physical Therapy

## 2021-09-04 ENCOUNTER — Encounter: Payer: Self-pay | Admitting: Physical Therapy

## 2021-09-04 DIAGNOSIS — M6281 Muscle weakness (generalized): Secondary | ICD-10-CM | POA: Diagnosis not present

## 2021-09-04 DIAGNOSIS — M5459 Other low back pain: Secondary | ICD-10-CM

## 2021-09-04 DIAGNOSIS — R2689 Other abnormalities of gait and mobility: Secondary | ICD-10-CM

## 2021-09-04 NOTE — Therapy (Signed)
OUTPATIENT PHYSICAL THERAPY TREATMENT    Patient Name: Shari Horn MRN: [id number] DOB:09-06-1940, 81 y.o., female Today's Date: 09/04/2021   PT End of Session - 09/04/21 1532     Visit Number 2    Number of Visits 16    Date for PT Re-Evaluation 10/24/21    Authorization Type UHC    PT Start Time 1520    PT Stop Time 1600    PT Time Calculation (min) 40 min    Equipment Utilized During Treatment Gait belt    Activity Tolerance Patient tolerated treatment well    Behavior During Therapy WFL for tasks assessed/performed              Past Medical History:  Diagnosis Date   Carotid artery disease ([place])    Depression    Dyslipidemia    Hyperlipidemia    Hypothyroidism    Mood disorder ([place])    BPD   PVC's (premature ventricular contractions)    Past Surgical History:  Procedure Laterality Date   BREAST BIOPSY     BREAST EXCISIONAL BIOPSY Left 1990   BREAST EXCISIONAL BIOPSY Left 1996   Carotid Doppler  12/2011   0-49% RICA stenosis, 06-26% LICA stenosis   TOTAL HIP ARTHROPLASTY Left 12/12/2017   Procedure: TOTAL HIP ARTHROPLASTY ANTERIOR APPROACH;  Surgeon: Rod Can, MD;  Location: [place];  Service: Orthopedics;  Laterality: Left;   TRANSTHORACIC ECHOCARDIOGRAM  12/2011   EF 94-85%, grade 1 diastolic dysfunction; trivial AV regurg; calcified MV annulus   Patient Active Problem List   Diagnosis Date Noted   Chronic back pain 10/22/2020   Forgetfulness 03/10/2017   Unsteady gait 01/25/2017   Gastroesophageal reflux disease without esophagitis 10/12/2016   Osteopenia 09/17/2016   Dysphagia 09/14/2016   Bipolar 2 disorder ([place]) 09/14/2016   Hypothyroidism 09/14/2016   Bilateral carotid artery disease ([place]) 12/20/2012   PVC's (premature ventricular contractions) 12/20/2012   Dyslipidemia 12/20/2012    PCP: Dimas Chyle   REFERRING PROVIDER: Dimas Chyle  REFERRING DIAG: Low back pain   Rationale for Evaluation and Treatment Rehabilitation  THERAPY  DIAG:  Other low back pain  Other abnormalities of gait and mobility  Muscle weakness (generalized)  ONSET DATE:   SUBJECTIVE:                                                                                                                                                                                           SUBJECTIVE STATEMENT:4 09/04/21: No new complaints. Brought foldable walker in today to try.   Eval: Previous: Nov 2019 first bad fall- hip fx with resulting THA on L. Then  had a fall, fractured pelvis. Then broke ankle.  Recently 07/21/21: Golden Circle in her bathroom 2 times. She was in a different bathroom/tub without grab bars.  Has been in a lot of pain after recent falls. Mentally down because of decreased mobility.  Yesterday- dizzy. Did take muscle relaxer which she thinks causes her to be dizzy.  Pt reports recent testing: with results of: Mild cognitive decline Pt lives alone, 4 steps to enter with 2 hand rails.  Has grab bars in home and bathrooms. Has SPC and rolling walker.  She is not using cane often, and does not use walker at all.  Main complaint is Back pain- did have back surgery 2002. She has had some pain in back, but fall has significantly increased pain in back. States pain in both sides of low back, causing decreased mobility.    PERTINENT HISTORY:  Multiple falls, L THA,   PAIN:  Are you having pain? Yes: NPRS scale: 7/10 Pain location: Bil low back Pain description: sore, painful Aggravating factors: constant pain, worse with standing and walking Relieving factors: laying on her side.    PRECAUTIONS: Fall  WEIGHT BEARING RESTRICTIONS No  FALLS:  Has patient fallen in last 6 months? Yes. Number of falls 2  OCCUPATION:   PLOF: Independent  PATIENT GOALS  decreased pain in back, improve balance, walking,    OBJECTIVE:   DIAGNOSTIC FINDINGS:     COGNITION:  Overall cognitive status: Within functional limits for tasks  assessed      PALPATION: Tenderness in R QL region, states pain bilaterally, in lateral upper lumbar musculature   LUMBAR ROM:   Active  AROM  eval  Flexion WFL  Extension Mild limitation  Right lateral flexion Mild limitation  Left lateral flexion Mild limitation  Right rotation   Left rotation    (Blank rows = not tested)   Hips: WFL, mild limitation for IR/ER on L.   Knees: WFL     LOWER EXTREMITY MMT:    MMT Right eval Left eval  Hip flexion 4- 4-  Hip extension    Hip abduction 4- 4-  Hip adduction    Hip internal rotation    Hip external rotation    Knee flexion 4 4  Knee extension 4 4  Ankle dorsiflexion    Ankle plantarflexion    Ankle inversion    Ankle eversion     (Blank rows = not tested)  LUMBAR SPECIAL TESTS:  Neg SLR   FUNCTIONAL TESTS:    [facility] PT Assessment - 09/07/21 0001       Standardized Balance Assessment   Standardized Balance Assessment Berg Balance Test;Dynamic Gait Index;Five Times Sit to Stand    Five times sit to stand comments  using 2 hands: 22.09      Berg Balance Test   Sit to Stand Able to stand  independently using hands    Standing Unsupported Able to stand safely 2 minutes    Sitting with Back Unsupported but Feet Supported on Floor or Stool Able to sit safely and securely 2 minutes    Stand to Sit Controls descent by using hands    Transfers Able to transfer safely, definite need of hands    Standing Unsupported with Eyes Closed Able to stand 10 seconds with supervision    Standing Unsupported with Feet Together Able to place feet together independently but unable to hold for 30 seconds    From Standing, Reach Forward with Outstretched Arm Can reach forward >  12 cm safely (5")    From Standing Position, Pick up Object from [place] to pick up shoe safely and easily    From Standing Position, Turn to Look Behind Over each Shoulder Looks behind one side only/other side shows less weight shift    Turn 360 Degrees Able  to turn 360 degrees safely but slowly    Standing Unsupported, Alternately Place Feet on Step/Stool Able to complete 4 steps without aid or supervision    Standing Unsupported, One Foot in Front Needs help to step but can hold 15 seconds    Standing on One Leg Tries to lift leg/unable to hold 3 seconds but remains standing independently    Total Score 38      Dynamic Gait Index   Level Surface Mild Impairment    Change in Gait Speed Mild Impairment    Gait with Horizontal Head Turns Moderate Impairment    Gait with Vertical Head Turns Moderate Impairment    Gait and Pivot Turn Moderate Impairment    Step Over Obstacle Moderate Impairment    Step Around Obstacles Mild Impairment    Steps Moderate Impairment    Total Score 11               GAIT: Distance walked: 50 ft x 2  Assistive device utilized: None Would benefit from use of cane or walker  Level of assistance: Complete Independence Comments: Slow, cautious, increased step width, inequality of step length, poor balance.     TODAY'S TREATMENT  09/04/21: Therapeutic Exercise: Aerobic: Supine: Seated: Scap squeeze x 15;  Standing:  Stretches: SKTC 30 sec x 3;  LTR x 15; Seated prayer stretch 30 sec 3;  Neuromuscular Re-education: Manual Therapy: Therapeutic Activity: Self Care: Physical performance measure: see above. DGI, BERG, 5 time sit to stand   PATIENT EDUCATION:  Education details: PT POC, Exam findings,  Person educated: Patient Education method: Explanation, Demonstration, Tactile cues, and Verbal cues Education comprehension: verbalized understanding, returned demonstration, verbal cues required, tactile cues required, and needs further education   HOME EXERCISE PROGRAM: Access Code: [facility] URL: [email or web address] Date: 08/29/2021 Prepared by: Lyndee Hensen  Exercises - Hooklying Single Knee to Chest  - 2 x daily - 3 reps - 30 hold - Supine Lower Trunk Rotation  - 2 x daily -  10 reps - 5 hold   ASSESSMENT:  CLINICAL IMPRESSION: 09/04/21:  continued evaluation for balance today. Pt with scores indicating fall risk. Will benefit from treatment for dynamic balance, safety, and falls. Educated on use and safety of walker, she will start to use to decrease fall risk. Reviewed HEP for back pain, will continue to progress mobility and strengthening for back pain as tolerated.    Eval: Pt presents to PT with main complaints of back pain and poor balance. She has increased pain in back after more recent falls. She has increased pain in back musculature, minimal pain at midline. She has had decrease in ability for standing, walking, and functional activity due to severity of pain. She also has had a few falls recently with declining balance. She does demonstrate poor balance today, with gait deficits, and need for assistive device to improve safety. Discussed use of cane and/or walker at this time for fall prevention. Plan to test balance next visit. Pt to benefit from skilled PT to improve deficits and pain.    OBJECTIVE IMPAIRMENTS Abnormal gait, decreased activity tolerance, decreased balance, decreased coordination, decreased knowledge of use of DME, decreased mobility,  difficulty walking, decreased strength, decreased safety awareness, increased muscle spasms, impaired flexibility, and pain.   ACTIVITY LIMITATIONS carrying, lifting, bending, standing, squatting, stairs, transfers, and locomotion level  PARTICIPATION LIMITATIONS: meal prep, cleaning, laundry, driving, shopping, and community activity  PERSONAL FACTORS  none  are also affecting patient's functional outcome.   REHAB POTENTIAL: Good  CLINICAL DECISION MAKING: Evolving/moderate complexity  EVALUATION COMPLEXITY: Moderate   GOALS: Goals reviewed with patient? Yes  SHORT TERM GOALS: Target date: 09/12/2021  Pt to be independet with iniital HEP  Goal status: INITIAL  2.  Pt to report decreased pain  in back to 0-5/10 with activity.  Goal status: INITIAL  3.  Pt to report using SPC for ambulation at least 50 % of the time for walking.  :  Goal status: INITIAL     LONG TERM GOALS: Target date: 10/24/2021  Pt to be independent with final HEP  Goal status: INITIAL  2.  Pt to demo improved strength of hips to at least 4+5 to improve stability and gait/stair ability    Goal status: INITIAL  3.  Pt to report decreased pain in back to 0-4/10 with standing and walking activity.    Goal status: INITIAL  4.  Pt to demo improved score on BERG by at least 6 points   Goal status: INITIAL  5.  Pt ot demo improved score on DGI by at least 5 points.   Goal status: INITIAL    PLAN: PT FREQUENCY: 2x/week  PT DURATION: 8 weeks  PLANNED INTERVENTIONS: Therapeutic exercises, Therapeutic activity, Neuromuscular re-education, Balance training, Gait training, Patient/Family education, Self Care, Joint mobilization, Joint manipulation, Stair training, DME instructions, Dry Needling, Electrical stimulation, Spinal manipulation, Spinal mobilization, Cryotherapy, Moist heat, Taping, Traction, Ultrasound, Ionotophoresis '4mg'$ /ml Dexamethasone, Manual therapy, and Re-evaluation.  PLAN FOR NEXT SESSION:    Lyndee Hensen, PT 09/04/2021, 3:33 PM

## 2021-09-10 ENCOUNTER — Encounter: Payer: Medicare Other | Admitting: Physical Therapy

## 2021-09-15 ENCOUNTER — Encounter: Payer: Medicare Other | Admitting: Physical Therapy

## 2021-09-16 ENCOUNTER — Telehealth: Payer: Self-pay | Admitting: *Deleted

## 2021-09-16 MED ORDER — CLONAZEPAM 0.5 MG PO TABS: 0.5000 mg | ORAL_TABLET | Freq: Three times a day (TID) | ORAL | 1 refills | Status: DC

## 2021-09-16 NOTE — Telephone Encounter (Signed)
Pharmacy requesting R Clonazepan refill

## 2021-09-16 NOTE — Telephone Encounter (Signed)
Rx sent in.  Algis Greenhouse. Jerline Pain, MD 09/16/2021 12:37 PM

## 2021-09-18 ENCOUNTER — Encounter: Payer: Medicare Other | Admitting: Physical Therapy

## 2021-09-23 ENCOUNTER — Encounter: Payer: Medicare Other | Admitting: Physical Therapy

## 2021-09-24 ENCOUNTER — Telehealth: Payer: Self-pay | Admitting: Physician Assistant

## 2021-09-24 ENCOUNTER — Ambulatory Visit: Payer: Medicare Other | Admitting: Physician Assistant

## 2021-09-24 ENCOUNTER — Encounter: Payer: Self-pay | Admitting: Physician Assistant

## 2021-09-24 NOTE — Progress Notes (Signed)
This encounter was created in error - please disregard.

## 2021-09-24 NOTE — Telephone Encounter (Signed)
Patient presented to today's office visit expecting to get a echocardiogram.  She was quite disappointed that today's visit is a office visit rather than her echocardiogram testing.  She does not remember canceling her previous echo that was previously scheduled for 7/17.  She wished to have her echocardiogram done to look at her heart valves.  We will cancel today's visit and refund her co-pay, she will proceed with echocardiogram in the next 2 to 3 weeks.  I will push today's visit to 6 weeks from now.

## 2021-09-25 ENCOUNTER — Encounter: Payer: Medicare Other | Admitting: Physical Therapy

## 2021-09-26 ENCOUNTER — Telehealth: Payer: Self-pay | Admitting: Family Medicine

## 2021-09-26 ENCOUNTER — Other Ambulatory Visit: Payer: Self-pay | Admitting: Physician Assistant

## 2021-09-26 DIAGNOSIS — F3181 Bipolar II disorder: Secondary | ICD-10-CM

## 2021-09-26 MED ORDER — QUETIAPINE FUMARATE 300 MG PO TABS: 300.0000 mg | ORAL_TABLET | Freq: Every day | ORAL | 0 refills | Status: DC

## 2021-09-26 NOTE — Telephone Encounter (Signed)
Noted  

## 2021-09-26 NOTE — Telephone Encounter (Signed)
Please advise on refill for patient.

## 2021-09-26 NOTE — Telephone Encounter (Signed)
LAST APPOINTMENT DATE:  08/08/21  NEXT APPOINTMENT DATE:  None  MEDICATION: QUEtiapine (SEROQUEL) 300 MG tablet   Is the patient out of medication? Yes  PHARMACY: [facility] [place], [place] [address]  [place], [place] [address]  Phone:  [phone]  Fax:  [phone]   Patient states: -She went to have refilled at pharmacy last week and was told medication would be requested on Tuesday, following labor day  - She was given 5 pills to hold her over  - She hasn't received a refill from pharmacy and was informed by them to request from PCP.

## 2021-09-29 IMAGING — CT CT ANGIO CHEST
2 of 7 series · 17 of 46 positions shown · IV contrast (APPLIED)
Comparison: Chest radiograph October 18, 2018

CLINICAL DATA: Fall with pelvis fracture.  Pain.  Positive D-dimer

EXAM:
CT ANGIOGRAPHY CHEST WITH CONTRAST
TECHNIQUE: Multidetector CT imaging of the chest was performed using the
standard protocol during bolus administration of intravenous
contrast. Multiplanar CT image reconstructions and MIPs were
obtained to evaluate the vascular anatomy.
CONTRAST:  57mL OMNIPAQUE IOHEXOL 350 MG/ML SOLN

[Series 7: thins · axial · 0.65mm/px · z∈[-243,+11]mm · 14 of 409 slices shown]
[im 23/409  lung]
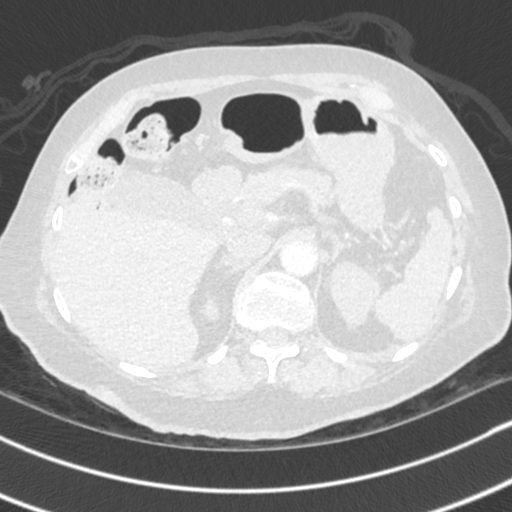
[im 46/409  soft-tissue]
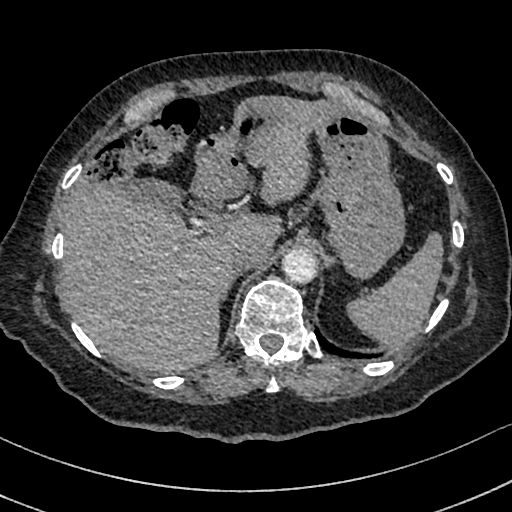
[im 91/409  lung]
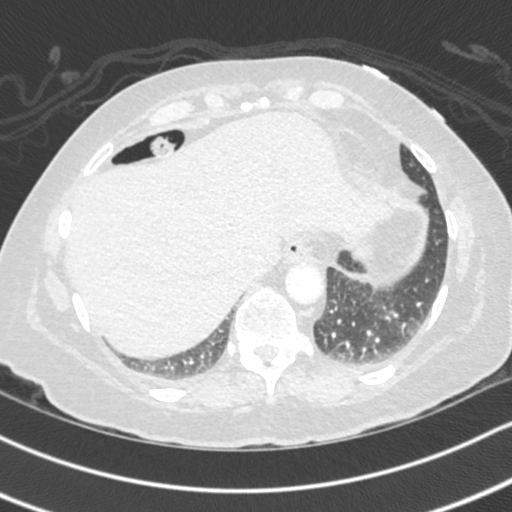
[im 114/409  soft-tissue]
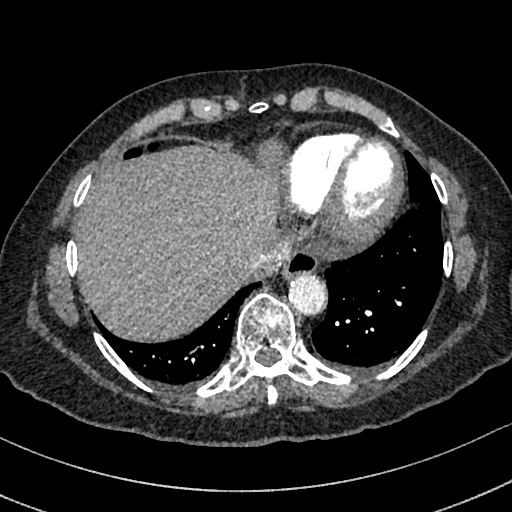
[im 137/409  lung]
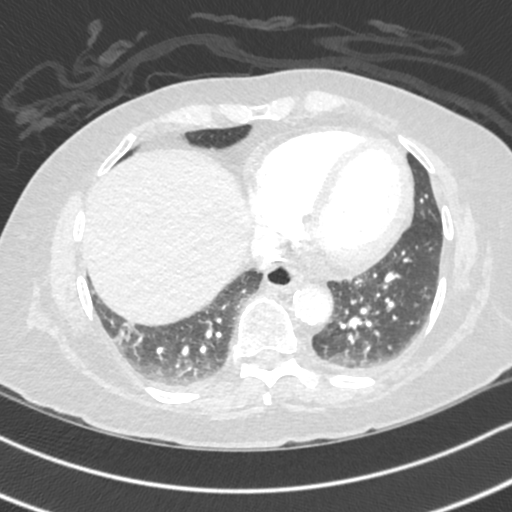
[im 159/409  soft-tissue]
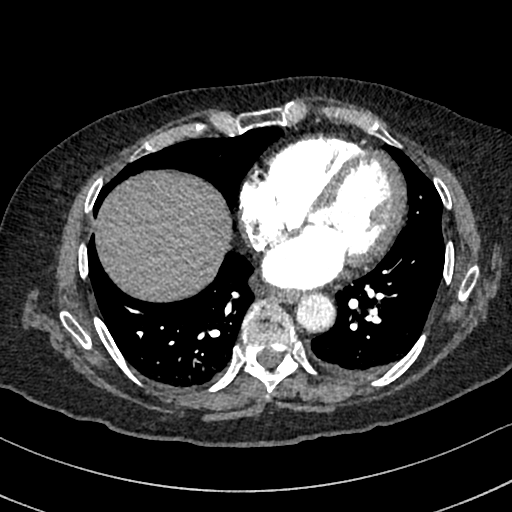
[im 182/409  lung]
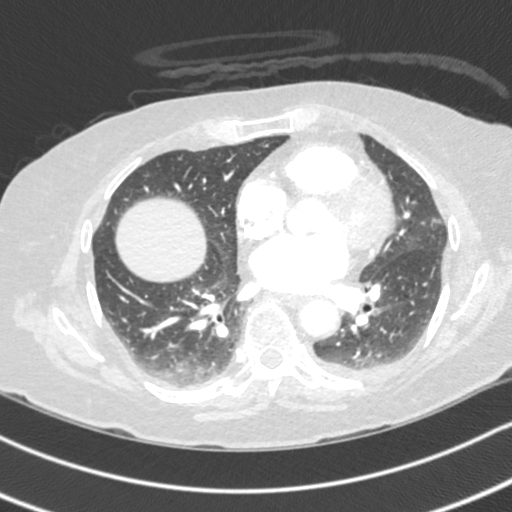
[im 227/409  soft-tissue]
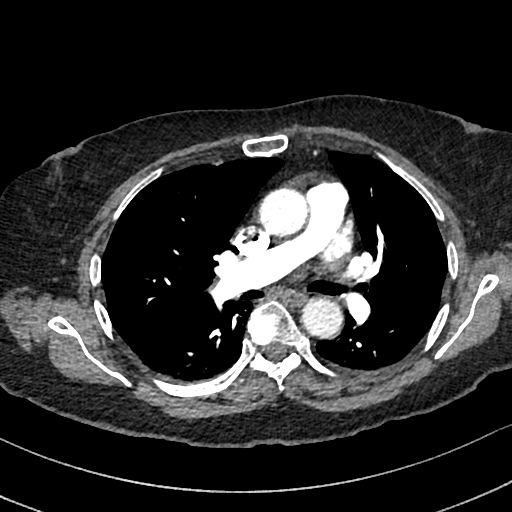
[im 250/409  lung]
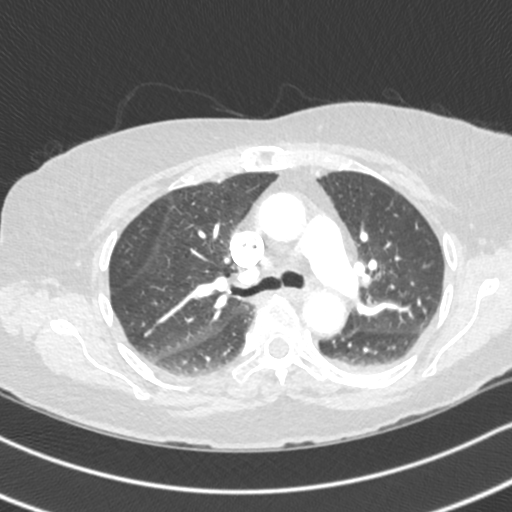
[im 273/409  soft-tissue]
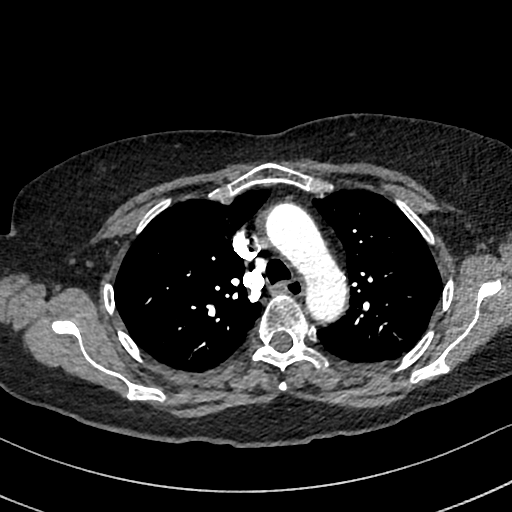
[im 295/409  lung]
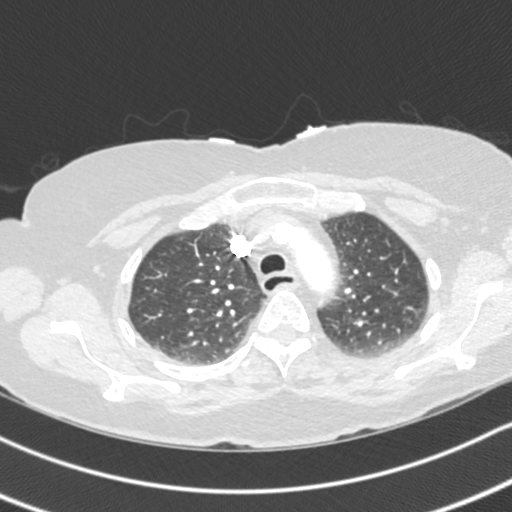
[im 318/409  soft-tissue]
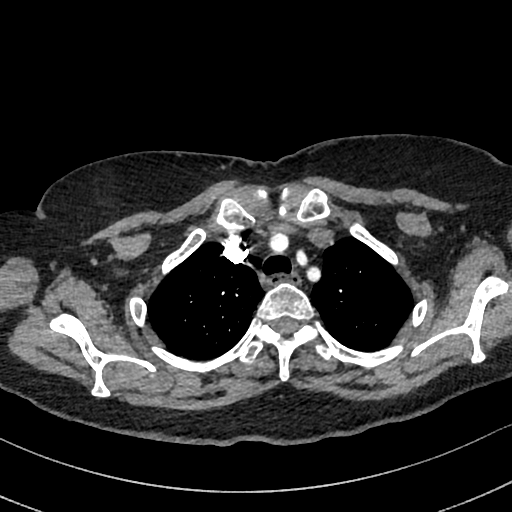
[im 363/409  lung]
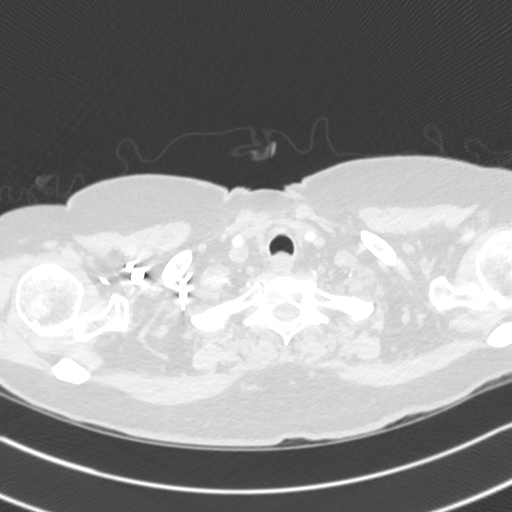
[im 386/409  soft-tissue]
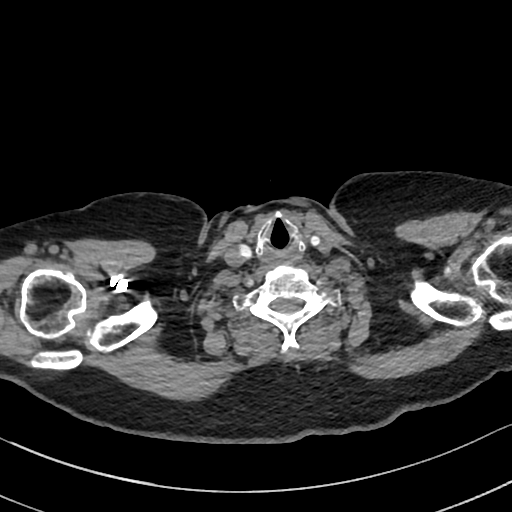

[Series 8: cor · coronal · 0.61mm/px · 3 of 133 slices shown]
[im 34/133  soft-tissue]
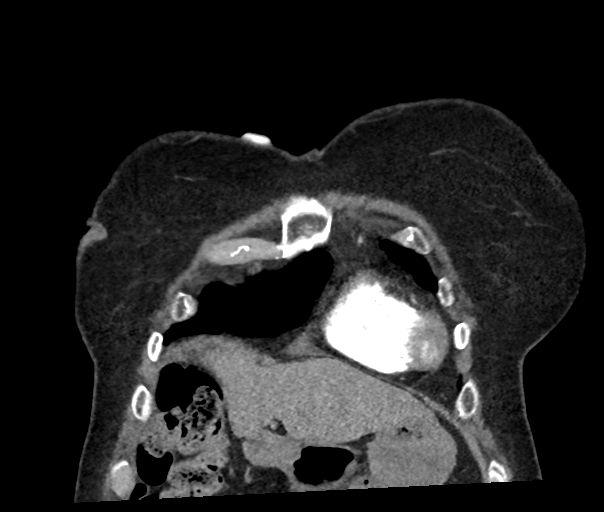
[im 67/133  soft-tissue]
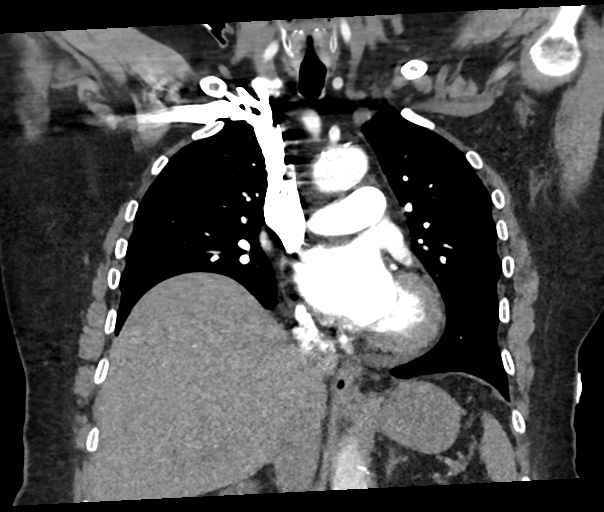
[im 100/133  soft-tissue]
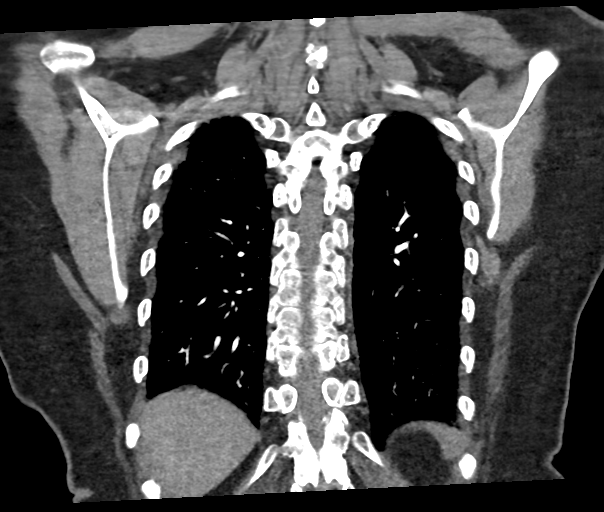

[17 of 46 positions shown; findings below may reference images not displayed]

FINDINGS: Cardiovascular: There is no demonstrable pulmonary embolus. There is
no appreciable thoracic aortic aneurysm or dissection. The
visualized great vessels show scattered foci of calcification. There
are foci of aortic atherosclerosis. There are foci of coronary
artery calcification. Rather minimal pericardial fluid may be within
physiologic range. There is no pericardial thickening.

Mediastinum/Nodes: Thyroid is diminutive. No thyroid lesions
demonstrable on this study. There is no evident thoracic adenopathy.
There is a focal hiatal hernia.

Lungs/Pleura: No pneumothorax. There is bibasilar atelectatic
change. There is no evident edema or consolidation. On axial slice
47 series 6, there is a 1 mm nodular opacity in the anterior segment
of the right upper lobe. On axial slice 84 series 6, there is a 2 mm
nodular opacity in the periphery of the lateral segment of the right
lower lobe. No appreciable pleural effusions.

Upper Abdomen: There is upper abdominal aortic atherosclerosis.
Visualized upper abdominal structures otherwise appear unremarkable.

Musculoskeletal: There is anterior wedging of the T10 vertebral
body, age uncertain. There is a nondisplaced fracture of the
anterior inferior aspect of the T11 vertebral body. No blastic or
lytic bone lesions. No chest wall lesions.

Review of the MIP images confirms the above findings.
IMPRESSION: 1. No demonstrable pulmonary embolus. No thoracic aortic aneurysm or
dissection. There is aortic atherosclerosis. There are foci of great
vessel and coronary artery calcification.

2. No pneumothorax. Areas of mild atelectatic change. Occasional 1-2
mm nodular opacities as noted. No follow-up needed if patient is
low-risk (and has no known or suspected primary neoplasm).
Non-contrast chest CT can be considered in 12 months if patient is
high-risk. This recommendation follows the consensus statement:
Guidelines for Management of Incidental Pulmonary Nodules Detected
[DATE].

3.  Hiatal hernia.

4. Anterior wedging of the T10 vertebral body. Fracture along the
inferior aspect of T11 vertebral body. Given other trauma, these
fractures may well be recent.

5.  No evident adenopathy.

Aortic Atherosclerosis (XVI3A-2MO.O).

## 2021-09-30 ENCOUNTER — Encounter: Payer: Medicare Other | Admitting: Physical Therapy

## 2021-10-02 ENCOUNTER — Encounter: Payer: Medicare Other | Admitting: Physical Therapy

## 2021-10-03 ENCOUNTER — Other Ambulatory Visit: Payer: Self-pay | Admitting: Internal Medicine

## 2021-10-07 ENCOUNTER — Ambulatory Visit (HOSPITAL_COMMUNITY): Payer: Medicare Other | Attending: Physician Assistant

## 2021-10-07 ENCOUNTER — Encounter: Payer: Medicare Other | Admitting: Physical Therapy

## 2021-10-07 DIAGNOSIS — R55 Syncope and collapse: Secondary | ICD-10-CM

## 2021-10-07 LAB — ECHOCARDIOGRAM COMPLETE
AR max vel: 1.78 cm2
AV Area VTI: 1.74 cm2
AV Area mean vel: 1.75 cm2
AV Mean grad: 10.3 mmHg
AV Peak grad: 19 mmHg
Ao pk vel: 2.18 m/s
Area-P 1/2: 4.21 cm2
P 1/2 time: 393 ms
S' Lateral: 2.3 cm

## 2021-10-09 ENCOUNTER — Encounter: Payer: Medicare Other | Admitting: Physical Therapy

## 2021-10-13 ENCOUNTER — Encounter: Payer: Self-pay | Admitting: *Deleted

## 2021-10-14 ENCOUNTER — Encounter: Payer: Medicare Other | Admitting: Physical Therapy

## 2021-10-16 ENCOUNTER — Encounter: Payer: Medicare Other | Admitting: Physical Therapy

## 2021-10-17 ENCOUNTER — Other Ambulatory Visit: Payer: Self-pay

## 2021-10-17 MED ORDER — LEVOTHYROXINE SODIUM 75 MCG PO TABS: ORAL_TABLET | ORAL | 1 refills | Status: DC

## 2021-11-04 ENCOUNTER — Telehealth: Payer: Self-pay | Admitting: *Deleted

## 2021-11-04 NOTE — Telephone Encounter (Signed)
Spoke with patient, stated pharmacy is out of Rx Clonazepam 0.'5mg'$   Requesting if is possible to send Rx Clonazepam '1mg'$  as pharmacy suggested, taking 1/2 tablet  Please advise

## 2021-11-05 MED ORDER — CLONAZEPAM 1 MG PO TABS: 0.5000 mg | ORAL_TABLET | Freq: Three times a day (TID) | ORAL | 1 refills | Status: DC | PRN

## 2021-11-06 ENCOUNTER — Ambulatory Visit: Payer: Medicare Other | Attending: Physician Assistant | Admitting: Physician Assistant

## 2021-11-06 VITALS — BP 136/74 | HR 86 | Ht 67.0 in | Wt 174.0 lb

## 2021-11-06 DIAGNOSIS — I6523 Occlusion and stenosis of bilateral carotid arteries: Secondary | ICD-10-CM | POA: Diagnosis not present

## 2021-11-06 DIAGNOSIS — R296 Repeated falls: Secondary | ICD-10-CM

## 2021-11-06 NOTE — Patient Instructions (Addendum)
Medication Instructions:  Your physician recommends that you continue on your current medications as directed. Please refer to the Current Medication list given to you today.  *If you need a refill on your cardiac medications before your next appointment, please call your pharmacy*  Lab Work: NONE ordered at this time of appointment   If you have labs (blood work) drawn today and your tests are completely normal, you will receive your results only by: [place] (if you have MyChart) OR A paper copy in the mail If you have any lab test that is abnormal or we need to change your treatment, we will call you to review the results.  Testing/Procedures: NONE ordered at this time of appointment   Follow-Up: At [facility], you and your health needs are our priority.  As part of our continuing mission to provide you with exceptional heart care, we have created designated Provider Care Teams.  These Care Teams include your primary Cardiologist (physician) and Advanced Practice Providers (APPs -  Physician Assistants and Nurse Practitioners) who all work together to provide you with the care you need, when you need it.  Your next appointment:   6-9 month(s)  The format for your next appointment:   In Person  Provider:   Pixie Casino, MD     Other Instructions  Important Information About Sugar

## 2021-11-06 NOTE — Progress Notes (Signed)
Cardiology Office Note:    Date:  11/08/2021   ID:  Shari Horn, DOB [birth year], MRN [id number]  PCP:  Shari Barrack, MD   [place] Providers Cardiologist:  Pixie Casino, MD     Referring MD: Shari Barrack, MD   Chief Complaint  Patient presents with   Follow-up    Seen for Dr. Debara Pickett    History of Present Illness:    Shari Horn is a 81 y.o. female with a hx of HLD, PVCs, hypothyroidism, bipolar disorder and carotid artery disease.  She has known 40-59% left internal carotid artery stenosis and at 0-39% right carotid stenosis based on carotid ultrasound 02/04/2016. Echocardiogram obtained on 01/06/2012 showed EF 60-65%.  Repeat echocardiogram in August 2018 demonstrated EF of 60 to 65%, mild AI, mild MR, mild LAE, no regional wall motion abnormality, grade 1 DD.  Patient was last seen by Dr. Debara Pickett in April 2021 at which time she was doing well.  Most recent carotid Doppler in February 2013 demonstrated mild disease bilaterally.  I first saw the patient in June 2023 at which time her primary concern was frequent fall.  She previously underwent extensive neurological work-up in the past and was told by her neurologist to consider additional cardiac evaluation for follow-up.  However on further questioning, the only episode where she truly had a nonmechanical fall was dating back to 2019.  Given lack of recurrence, my suspicion for significant cardiac arrhythmia is fairly low.  She had another fall and went to the ED on 07/21/2021 after falling in the past up and striking the back of her head.  She was treated with pain medication.  Her urinalysis was normal.  Red blood cell count is normal.  CT of the head showed no acute intracranial abnormality, mild cerebral volume loss and chronic microvascular ischemic changes of the white matter.  She returned back to the ED on 07/27/2021 with constipation.  CT abdomen and pelvis is consistent with mild colonic constipation, hiatal hernia,  no other focal abnormality was seen.  She came in for visit on 09/24/2021, however echocardiogram was not completed at the time, therefore we canceled her visit.  Her echocardiogram obtained on 10/07/2021 showed EF of 65 to 70%, mild asymmetric LV hypertrophy of this basal septal segment, mild MR, mild AI.  Patient presents today for follow-up.  She denies any chest pain or shortness of breath.  She did continue to have multiple falls.  She says she slipped and tripped in July in the bath.  It sounds like another mechanical fall.  At this time, I do not recommend further cardiac work-up.    Past Medical History:  Diagnosis Date   Carotid artery disease ([place])    Depression    Dyslipidemia    Hyperlipidemia    Hypothyroidism    Mood disorder (HCC)    BPD   PVC's (premature ventricular contractions)     Past Surgical History:  Procedure Laterality Date   BREAST BIOPSY     BREAST EXCISIONAL BIOPSY Left 1990   BREAST EXCISIONAL BIOPSY Left 1996   Carotid Doppler  12/2011   0-49% RICA stenosis, 78-29% LICA stenosis   TOTAL HIP ARTHROPLASTY Left 12/12/2017   Procedure: TOTAL HIP ARTHROPLASTY ANTERIOR APPROACH;  Surgeon: Rod Can, MD;  Location: [facility];  Service: Orthopedics;  Laterality: Left;   TRANSTHORACIC ECHOCARDIOGRAM  12/2011   EF 56-21%, grade 1 diastolic dysfunction; trivial AV regurg; calcified MV annulus  Current Medications: Current Meds  Medication Sig   acetaminophen (TYLENOL) 500 MG tablet Take 1 tablet (500 mg total) by mouth every 6 (six) hours as needed.   azelastine (ASTELIN) 0.1 % nasal spray Place 2 sprays into both nostrils 2 (two) times daily.   baclofen (LIORESAL) 10 MG tablet Take 0.5 tablets (5 mg total) by mouth 3 (three) times daily.   buPROPion (WELLBUTRIN XL) 150 MG 24 hr tablet Take 1 tablet (150 mg total) by mouth daily.   cholecalciferol (VITAMIN D3) 25 MCG (1000 UT) tablet Take 2,000 Units by mouth daily.   clonazePAM (KLONOPIN) 1 MG tablet Take  0.5 tablets (0.5 mg total) by mouth 3 (three) times daily as needed for anxiety.   ezetimibe (ZETIA) 10 MG tablet TAKE 1 TABLET BY MOUTH DAILY   FLUoxetine (PROZAC) 20 MG capsule TAKE 3 CAPSULES(60 MG) BY MOUTH DAILY   lamoTRIgine (LAMICTAL) 25 MG tablet TAKE 1 TABLET(25 MG) BY MOUTH DAILY   levothyroxine (SYNTHROID) 75 MCG tablet Take 1 tablet by mouth daily   meclizine (ANTIVERT) 25 MG tablet Take 1 tablet (25 mg total) by mouth 3 (three) times daily as needed for dizziness.   Multiple Vitamin (MULTIVITAMIN WITH MINERALS) TABS tablet Take 1 tablet by mouth daily.   pantoprazole (PROTONIX) 40 MG tablet TAKE 1 TABLET(40 MG) BY MOUTH DAILY   polyethylene glycol (MIRALAX) 17 g packet Take 17 g by mouth daily as needed for mild constipation.   QUEtiapine (SEROQUEL) 300 MG tablet Take 1 tablet (300 mg total) by mouth at bedtime.   rosuvastatin (CRESTOR) 40 MG tablet Take 1 tablet (40 mg total) by mouth daily. Appointment with cardiologist needed for further refills   senna (SENOKOT) 8.6 MG TABS tablet Take 5 tablets by mouth at bedtime.    senna-docusate (SENOKOT-S) 8.6-50 MG tablet Take 1 tablet by mouth at bedtime as needed for mild constipation or moderate constipation.     Allergies:   Codeine   Social History   Socioeconomic History   Marital status: Divorced    Spouse name: Not on file   Number of children: 2   Years of education: master's   Highest education level: Not on file  Occupational History    Comment: retired   Tobacco Use   Smoking status: Former    Years: 10.00    Types: Cigarettes    Quit date: 12/19/1972    Years since quitting: 48.9    Passive exposure: Past   Smokeless tobacco: Never  Vaping Use   Vaping Use: Never used  Substance and Sexual Activity   Alcohol use: No    Comment: quit in 1973   Drug use: No   Sexual activity: Yes  Other Topics Concern   Not on file  Social History Narrative   Right handed   Two story home   Drinks caffeine   Social  Determinants of Health   Financial Resource Strain: Low Risk  (04/29/2020)   Overall Financial Resource Strain (CARDIA)    Difficulty of Paying Living Expenses: Not hard at all  Food Insecurity: No Food Insecurity (04/29/2020)   Hunger Vital Sign    Worried About Running Out of Food in the Last Year: Never true    [place] in the Last Year: Never true  Transportation Needs: No Transportation Needs (04/29/2020)   PRAPARE - Hydrologist (Medical): No    Lack of Transportation (Non-Medical): No  Physical Activity: Inactive (04/29/2020)   Exercise  Vital Sign    Days of Exercise per Week: 0 days    Minutes of Exercise per Session: 0 min  Stress: No Stress Concern Present (04/29/2020)   [place]    Feeling of Stress : Only a little  Social Connections: Socially Isolated (04/29/2020)   Social Connection and Isolation Panel [NHANES]    Frequency of Communication with Friends and Family: More than three times a week    Frequency of Social Gatherings with Friends and Family: More than three times a week    Attends Religious Services: Never    Marine scientist or Organizations: No    Attends Music therapist: Never    Marital Status: Divorced     Family History: The patient's family history includes Arthritis/Rheumatoid in her father; Diabetes in her mother; Heart Problems in her maternal grandmother; Heart disease in her brother and child; Heart failure in her mother; Hypertension in her mother; Thyroid disease in her mother. There is no history of Colon cancer, Esophageal cancer, or Stomach cancer.  ROS:   Please see the history of present illness.     All other systems reviewed and are negative.  EKGs/Labs/Other Studies Reviewed:    The following studies were reviewed today:  Echo 10/07/2021  1. Left ventricular ejection fraction, by estimation, is 65 to 70%. The  left  ventricle has normal function. The left ventricle has no regional  wall motion abnormalities. There is mild asymmetric left ventricular  hypertrophy of the basal-septal segment.  Left ventricular diastolic parameters were normal.   2. Right ventricular systolic function is normal. The right ventricular  size is normal. Tricuspid regurgitation signal is inadequate for assessing  PA pressure.   3. The mitral valve is degenerative. Mild mitral valve regurgitation. No  evidence of mitral stenosis.   4. The aortic valve was not well visualized. Aortic valve regurgitation  is mild. No aortic stenosis is present.   5. The inferior vena cava is normal in size with greater than 50%  respiratory variability, suggesting right atrial pressure of 3 mmHg.   EKG:  EKG is not ordered today.   Recent Labs: 04/18/2021: TSH 4.67 07/27/2021: ALT 19; BUN 12; Creatinine, Ser 1.17; Hemoglobin 14.1; Platelets 299; Potassium 3.7; Sodium 137  Recent Lipid Panel    Component Value Date/Time   CHOL 102 03/11/2021 1025   CHOL 92 (L) 08/20/2016 1038   CHOL 128 03/13/2014 1148   TRIG 93.0 03/11/2021 1025   TRIG 149 03/13/2014 1148   HDL 38.70 (L) 03/11/2021 1025   HDL 41 08/20/2016 1038   HDL 33 (L) 03/13/2014 1148   CHOLHDL 3 03/11/2021 1025   VLDL 18.6 03/11/2021 1025   LDLCALC 45 03/11/2021 1025   LDLCALC 28 08/20/2016 1038   LDLCALC 65 03/13/2014 1148     Risk Assessment/Calculations:           Physical Exam:    VS:  BP 136/74 (BP Location: Left Arm, Patient Position: Sitting, Cuff Size: Normal)   Pulse 86   Ht '5\' 7"'$  (1.702 m)   Wt 174 lb (78.9 kg)   LMP  (LMP Unknown)   SpO2 94%   BMI 27.25 kg/m        Wt Readings from Last 3 Encounters:  11/06/21 174 lb (78.9 kg)  09/24/21 177 lb (80.3 kg)  08/08/21 181 lb 3.2 oz (82.2 kg)     GEN:  Well nourished, well developed in no  acute distress HEENT: Normal NECK: No JVD; No carotid bruits LYMPHATICS: No lymphadenopathy CARDIAC: RRR, no  murmurs, rubs, gallops RESPIRATORY:  Clear to auscultation without rales, wheezing or rhonchi  ABDOMEN: Soft, non-tender, non-distended MUSCULOSKELETAL:  No edema; No deformity  SKIN: Warm and dry NEUROLOGIC:  Alert and oriented x 3 PSYCHIATRIC:  Normal affect   ASSESSMENT:    1. Frequent falls   2. Bilateral carotid artery stenosis    PLAN:    In order of problems listed above:  Frequent falls: With exception of one episode in 2019, all of her other falls are mechanical falls.  She has underwent extensive neurological work-up according to the patient.  Since she has not had another fall related to dizziness or presyncope in the past few years, probability of significant arrhythmia is very low.  Recent echocardiogram showed normal EF.  At this time, I do not recommend any further work-up  Bilateral carotid artery disease: Carotid Doppler obtained in February 2023 showed mild disease bilaterally.           Medication Adjustments/Labs and Tests Ordered: Current medicines are reviewed at length with the patient today.  Concerns regarding medicines are outlined above.  No orders of the defined types were placed in this encounter.  No orders of the defined types were placed in this encounter.   Patient Instructions  Medication Instructions:  Your physician recommends that you continue on your current medications as directed. Please refer to the Current Medication list given to you today.  *If you need a refill on your cardiac medications before your next appointment, please call your pharmacy*  Lab Work: NONE ordered at this time of appointment   If you have labs (blood work) drawn today and your tests are completely normal, you will receive your results only by: [place] (if you have MyChart) OR A paper copy in the mail If you have any lab test that is abnormal or we need to change your treatment, we will call you to review the results.  Testing/Procedures: NONE  ordered at this time of appointment   Follow-Up: At [facility], you and your health needs are our priority.  As part of our continuing mission to provide you with exceptional heart care, we have created designated Provider Care Teams.  These Care Teams include your primary Cardiologist (physician) and Advanced Practice Providers (APPs -  Physician Assistants and Nurse Practitioners) who all work together to provide you with the care you need, when you need it.  Your next appointment:   6-9 month(s)  The format for your next appointment:   In Person  Provider:   Pixie Casino, MD     Other Instructions  Important Information About Sugar         Hilbert Corrigan, [place]  11/08/2021 10:56 PM    [place]

## 2021-11-08 ENCOUNTER — Encounter: Payer: Self-pay | Admitting: Physician Assistant

## 2021-12-18 ENCOUNTER — Ambulatory Visit (INDEPENDENT_AMBULATORY_CARE_PROVIDER_SITE_OTHER): Payer: Medicare Other | Admitting: Physical Therapy

## 2021-12-18 ENCOUNTER — Encounter: Payer: Self-pay | Admitting: Physical Therapy

## 2021-12-18 DIAGNOSIS — R2689 Other abnormalities of gait and mobility: Secondary | ICD-10-CM | POA: Diagnosis not present

## 2021-12-18 DIAGNOSIS — M5459 Other low back pain: Secondary | ICD-10-CM | POA: Diagnosis not present

## 2021-12-18 DIAGNOSIS — M6281 Muscle weakness (generalized): Secondary | ICD-10-CM | POA: Diagnosis not present

## 2021-12-18 NOTE — Therapy (Addendum)
OUTPATIENT PHYSICAL THERAPY TREATMENT / Re-Cert   Patient Name: Shari Horn MRN: [id number] DOB:03-18-1940, 81 y.o., female Today's Date: 12/18/2021   PT End of Session - 12/18/21 2116     Visit Number 3    Number of Visits 16    Date for PT Re-Evaluation 02/12/22    Authorization Type [employer]    PT Start Time [id number]    PT Stop Time 1430    PT Time Calculation (min) 41 min    Equipment Utilized During Treatment Gait belt    Activity Tolerance Patient tolerated treatment well    Behavior During Therapy WFL for tasks assessed/performed               Past Medical History:  Diagnosis Date   Carotid artery disease ([place])    Depression    Dyslipidemia    Hyperlipidemia    Hypothyroidism    Mood disorder ([place])    BPD   PVC's (premature ventricular contractions)    Past Surgical History:  Procedure Laterality Date   BREAST BIOPSY     BREAST EXCISIONAL BIOPSY Left 1990   BREAST EXCISIONAL BIOPSY Left 1996   Carotid Doppler  12/2011   0-49% RICA stenosis, 0000000 LICA stenosis   TOTAL HIP ARTHROPLASTY Left 12/12/2017   Procedure: TOTAL HIP ARTHROPLASTY ANTERIOR APPROACH;  Surgeon: Rod Can, MD;  Location: [facility];  Service: Orthopedics;  Laterality: Left;   TRANSTHORACIC ECHOCARDIOGRAM  12/2011   EF [id number], grade 1 diastolic dysfunction; trivial AV regurg; calcified MV annulus   Patient Active Problem List   Diagnosis Date Noted   Chronic back pain 10/22/2020   Forgetfulness 03/10/2017   Unsteady gait 01/25/2017   Gastroesophageal reflux disease without esophagitis 10/12/2016   Osteopenia 09/17/2016   Dysphagia 09/14/2016   Bipolar 2 disorder ([place]) 09/14/2016   Hypothyroidism 09/14/2016   Bilateral carotid artery disease ([place]) 12/20/2012   PVC's (premature ventricular contractions) 12/20/2012   Dyslipidemia 12/20/2012    PCP: Dimas Chyle   REFERRING PROVIDER: Dimas Chyle  REFERRING DIAG: Low back pain   Rationale for Evaluation and Treatment  Rehabilitation  THERAPY DIAG:  Other low back pain  Other abnormalities of gait and mobility  Muscle weakness (generalized)  ONSET DATE:   SUBJECTIVE:                                                                                                                                                                                           SUBJECTIVE STATEMENT:  Pt last seen 8/17. States she now feels more able to attend PT consistently. She states continued pain in back, significant pain, that makes  it difficult for her to be active. Has been less active lately. Does think that stretches for HEP feel good. She is using 1 walking pole for ambulation at all times.   Eval: Previous: Nov 2019 first bad fall- hip fx with resulting THA on L. Then had a fall, fractured pelvis. Then broke ankle.  Recently 07/21/21: [place] in her bathroom 2 times. She was in a different bathroom/tub without grab bars.  Has been in a lot of pain after recent falls. Mentally down because of decreased mobility.  Yesterday- dizzy. Did take muscle relaxer which she thinks causes her to be dizzy.  Pt reports recent testing: with results of: Mild cognitive decline Pt lives alone, 4 steps to enter with 2 hand rails.  Has grab bars in home and bathrooms. Has SPC and rolling walker.  She is not using cane often, and does not use walker at all.  Main complaint is Back pain- did have back surgery 2002. She has had some pain in back, but fall has significantly increased pain in back. States pain in both sides of low back, causing decreased mobility.    PERTINENT HISTORY:  Multiple falls, L THA,   PAIN:  Are you having pain? Yes: NPRS scale: 7/10 Pain location: Bil low back Pain description: sore, painful Aggravating factors: constant pain, worse with standing and walking Relieving factors: laying on her side.    PRECAUTIONS: Fall  WEIGHT BEARING RESTRICTIONS No  FALLS:  Has patient fallen in last 6 months? Yes. Number  of falls 2  OCCUPATION:   PLOF: Independent  PATIENT GOALS  decreased pain in back, improve balance, walking,    OBJECTIVE: updated 11/30  DIAGNOSTIC FINDINGS:   COGNITION:  Overall cognitive status: Within functional limits for tasks assessed    PALPATION: Tenderness in R QL region, states pain bilaterally, in lateral upper lumbar musculature   LUMBAR ROM:   Active  AROM  eval  Flexion WFL  Extension Mild limitation  Right lateral flexion Mild limitation  Left lateral flexion Mild limitation  Right rotation   Left rotation    (Blank rows = not tested)   Hips: WFL, mild limitation for IR/ER on L.   Knees: WFL   LOWER EXTREMITY MMT:    MMT Right 12/18/21 Left 12/18/21  Hip flexion 4- 4-  Hip extension    Hip abduction 4- 4-  Hip adduction    Hip internal rotation    Hip external rotation    Knee flexion 4 4  Knee extension 4 4  Ankle dorsiflexion    Ankle plantarflexion    Ankle inversion    Ankle eversion     (Blank rows = not tested)  LUMBAR SPECIAL TESTS:  Neg SLR   FUNCTIONAL TESTS:   27.09  ; 5 time sit to stand , light use of hands on arm rests.    GAIT: Distance walked: 50 ft x 2  Assistive device utilized: Using walking pole.   Level of assistance: Complete Independence Comments:    TODAY'S TREATMENT   12/18/21 Therapeutic Exercise: Aerobic: Supine: pelvic tilts x 20;  Seated: Scap squeeze x 15;  Standing:  Stretches: SKTC 30 sec x 3;  LTR x 15; Seated prayer stretch 30 sec 3;  Neuromuscular Re-education: Manual Therapy: Therapeutic Activity: Self Care:   PATIENT EDUCATION:  Education details: PT POC, Exam findings, activity recommendations, updated and reviewed HEP Person educated: Patient Education method: Explanation, Demonstration, Tactile cues, and Verbal cues Education comprehension: verbalized understanding, returned demonstration,  verbal cues required, tactile cues required, and needs further education   HOME  EXERCISE PROGRAM: Access Code: [id number]    ASSESSMENT:  CLINICAL IMPRESSION: 12/18/21: ReEval:  Pt with ongoing pain in back, t-spine and L-spine. She has limited mobility due to pain. She is generally deconditioned, and will benefit from increased activity level. She has decreased balance and stability. Recommended continued use of walking pole for support. Pt will benefit from skilled PT to improve deficits and pain. No improvements made since last visit, due to pt only having 2 visits.  Reviewed and updated HEP for back pain, will continue to progress mobility and strengthening for back pain as tolerated.   Eval: Pt presents to PT with main complaints of back pain and poor balance. She has increased pain in back after more recent falls. She has increased pain in back musculature, minimal pain at midline. She has had decrease in ability for standing, walking, and functional activity due to severity of pain. She also has had a few falls recently with declining balance. She does demonstrate poor balance today, with gait deficits, and need for assistive device to improve safety. Discussed use of cane and/or walker at this time for fall prevention. Plan to test balance next visit. Pt to benefit from skilled PT to improve deficits and pain.    OBJECTIVE IMPAIRMENTS Abnormal gait, decreased activity tolerance, decreased balance, decreased coordination, decreased knowledge of use of DME, decreased mobility, difficulty walking, decreased strength, decreased safety awareness, increased muscle spasms, impaired flexibility, and pain.   ACTIVITY LIMITATIONS carrying, lifting, bending, standing, squatting, stairs, transfers, and locomotion level  PARTICIPATION LIMITATIONS: meal prep, cleaning, laundry, driving, shopping, and community activity  PERSONAL FACTORS  none  are also affecting patient's functional outcome.   REHAB POTENTIAL: Good  CLINICAL DECISION MAKING: Evolving/moderate  complexity  EVALUATION COMPLEXITY: Moderate   GOALS: Goals reviewed with patient? Yes  SHORT TERM GOALS: Target date: 01/01/2022    Pt to be independet with iniital HEP  Goal status: INITIAL  2.  Pt to report decreased pain in back to 0-5/10 with activity.  Goal status: INITIAL    LONG TERM GOALS: Target date: 02/12/2022    Pt to be independent with final HEP  Goal status: INITIAL  2.  Pt to demo improved strength of hips to at least 4+5 to improve stability and gait/stair ability    Goal status: INITIAL  3.  Pt to report decreased pain in back to 0-4/10 with standing and walking activity.    Goal status: INITIAL  4.  Pt to demo improved score on BERG by at least 6 points   Goal status: INITIAL  5.  Pt ot demo improved score on DGI by at least 5 points.   Goal status: INITIAL    PLAN: PT FREQUENCY: 2x/week  PT DURATION: 8 weeks  PLANNED INTERVENTIONS: Therapeutic exercises, Therapeutic activity, Neuromuscular re-education, Balance training, Gait training, Patient/Family education, Self Care, Joint mobilization, Joint manipulation, Stair training, DME instructions, Dry Needling, Electrical stimulation, Spinal manipulation, Spinal mobilization, Cryotherapy, Moist heat, Taping, Traction, Ultrasound, Ionotophoresis '4mg'$ /ml Dexamethasone, Manual therapy, and Re-evaluation.  PLAN FOR NEXT SESSION:    Lyndee Hensen, PT 12/18/2021, 9:19 PM  PHYSICAL THERAPY DISCHARGE SUMMARY  Visits from Start of Care: 3 Plan: Patient agrees to discharge.  Patient goals were partially  met. Patient is being discharged due to  - Pt did not return after last visit.      Lyndee Hensen, PT, DPT 2:11 PM  03/19/22

## 2021-12-25 ENCOUNTER — Encounter: Payer: Medicare Other | Admitting: Physical Therapy

## 2021-12-30 ENCOUNTER — Other Ambulatory Visit: Payer: Self-pay | Admitting: Family Medicine

## 2021-12-30 DIAGNOSIS — F3181 Bipolar II disorder: Secondary | ICD-10-CM

## 2021-12-30 MED ORDER — QUETIAPINE FUMARATE 300 MG PO TABS: 300.0000 mg | ORAL_TABLET | Freq: Every day | ORAL | 0 refills | Status: DC

## 2021-12-30 NOTE — Telephone Encounter (Signed)
Last refill done by historical provider  Last OV 08/08/2021

## 2021-12-30 NOTE — Telephone Encounter (Signed)
LAST APPOINTMENT DATE:  08/08/21  NEXT APPOINTMENT DATE: None  MEDICATION: QUEtiapine (SEROQUEL) 300 MG tablet   Is the patient out of medication? 2 days worth left  PHARMACY: [facility], [place] AT [facility] OF [address] [facility] [address], [place] [address] Phone: [phone]  Fax: [phone]

## 2021-12-31 DIAGNOSIS — H532 Diplopia: Secondary | ICD-10-CM | POA: Diagnosis not present

## 2021-12-31 DIAGNOSIS — H518 Other specified disorders of binocular movement: Secondary | ICD-10-CM | POA: Diagnosis not present

## 2022-01-01 ENCOUNTER — Encounter: Payer: Medicare Other | Admitting: Physical Therapy

## 2022-01-01 ENCOUNTER — Encounter: Payer: Self-pay | Admitting: *Deleted

## 2022-01-30 ENCOUNTER — Ambulatory Visit (INDEPENDENT_AMBULATORY_CARE_PROVIDER_SITE_OTHER): Payer: Medicare Other

## 2022-01-30 VITALS — Wt 174.0 lb

## 2022-01-30 DIAGNOSIS — F339 Major depressive disorder, recurrent, unspecified: Secondary | ICD-10-CM | POA: Diagnosis not present

## 2022-01-30 DIAGNOSIS — Z0000 Encounter for general adult medical examination without abnormal findings: Secondary | ICD-10-CM | POA: Diagnosis not present

## 2022-01-30 NOTE — Progress Notes (Signed)
I connected with  Shari Horn on 01/30/22 by a audio enabled telemedicine application and verified that I am speaking with the correct person using two identifiers.  Patient Location: Home  Provider Location: Office/Clinic  I discussed the limitations of evaluation and management by telemedicine. The patient expressed understanding and agreed to proceed.   Subjective:   Shari Horn is a 82 y.o. female who presents for Medicare Annual (Subsequent) preventive examination.  Review of Systems     Cardiac Risk Factors include: advanced age (>33mn, >>48women);dyslipidemia     Objective:    Today's Vitals   01/30/22 1438  Weight: 174 lb (78.9 kg)   Body mass index is 27.25 kg/m.     01/30/2022    2:53 PM 08/29/2021    1:57 PM 07/26/2021   10:55 PM 07/26/2021   10:54 PM 07/21/2021    9:14 AM 04/29/2020   11:06 AM 07/31/2019    9:14 AM  Advanced Directives  Does Patient Have a Medical Advance Directive? Yes No Yes No No Yes Yes  Type of AParamedicof AKyleLiving will  [place] will      Does patient want to make changes to medical advance directive?      Yes (MAU/Ambulatory/Procedural Areas - Information given)   Copy of [place] Chart? No - copy requested        Would patient like information on creating a medical advance directive?  No - Patient declined   No - Patient declined      Current Medications (verified) Outpatient Encounter Medications as of 01/30/2022  Medication Sig   acetaminophen (TYLENOL) 500 MG tablet Take 1 tablet (500 mg total) by mouth every 6 (six) hours as needed.   azelastine (ASTELIN) 0.1 % nasal spray Place 2 sprays into both nostrils 2 (two) times daily.   baclofen (LIORESAL) 10 MG tablet Take 0.5 tablets (5 mg total) by mouth 3 (three) times daily.   buPROPion (WELLBUTRIN XL) 150 MG 24 hr tablet Take 1 tablet (150 mg total) by mouth daily.   cholecalciferol (VITAMIN D3) 25 MCG  (1000 UT) tablet Take 2,000 Units by mouth daily.   clonazePAM (KLONOPIN) 1 MG tablet Take 0.5 tablets (0.5 mg total) by mouth 3 (three) times daily as needed for anxiety.   diclofenac (VOLTAREN) 75 MG EC tablet Take 1 tablet (75 mg total) by mouth 2 (two) times daily.   ezetimibe (ZETIA) 10 MG tablet TAKE 1 TABLET BY MOUTH DAILY   FLUoxetine (PROZAC) 20 MG capsule TAKE 3 CAPSULES(60 MG) BY MOUTH DAILY   lamoTRIgine (LAMICTAL) 25 MG tablet TAKE 1 TABLET(25 MG) BY MOUTH DAILY   levothyroxine (SYNTHROID) 75 MCG tablet Take 1 tablet by mouth daily   meclizine (ANTIVERT) 25 MG tablet Take 1 tablet (25 mg total) by mouth 3 (three) times daily as needed for dizziness.   Multiple Vitamin (MULTIVITAMIN WITH MINERALS) TABS tablet Take 1 tablet by mouth daily.   pantoprazole (PROTONIX) 40 MG tablet TAKE 1 TABLET(40 MG) BY MOUTH DAILY   polyethylene glycol (MIRALAX) 17 g packet Take 17 g by mouth daily as needed for mild constipation.   QUEtiapine (SEROQUEL) 300 MG tablet Take 1 tablet (300 mg total) by mouth at bedtime.   rosuvastatin (CRESTOR) 40 MG tablet Take 1 tablet (40 mg total) by mouth daily. Appointment with cardiologist needed for further refills   senna-docusate (SENOKOT-S) 8.6-50 MG tablet Take 1 tablet by mouth at bedtime as  needed for mild constipation or moderate constipation.   [DISCONTINUED] Fluocinolone Acetonide Body 0.01 % OIL Apply to affected area qd (Patient not taking: Reported on 01/30/2022)   [DISCONTINUED] lidocaine 4 % Place 1 patch onto the skin 2 (two) times daily. (Patient not taking: Reported on 11/06/2021)   [DISCONTINUED] senna (SENOKOT) 8.6 MG TABS tablet Take 5 tablets by mouth at bedtime.    No facility-administered encounter medications on file as of 01/30/2022.    Allergies (verified) Codeine   History: Past Medical History:  Diagnosis Date   Carotid artery disease ([place])    Depression    Dyslipidemia    Hyperlipidemia    Hypothyroidism    Mood disorder ([facility])     BPD   PVC's (premature ventricular contractions)    Past Surgical History:  Procedure Laterality Date   BREAST BIOPSY     BREAST EXCISIONAL BIOPSY Left 1990   BREAST EXCISIONAL BIOPSY Left 1996   Carotid Doppler  12/2011   0-49% RICA stenosis, 51-02% LICA stenosis   TOTAL HIP ARTHROPLASTY Left 12/12/2017   Procedure: TOTAL HIP ARTHROPLASTY ANTERIOR APPROACH;  Surgeon: Rod Can, MD;  Location: [facility];  Service: Orthopedics;  Laterality: Left;   TRANSTHORACIC ECHOCARDIOGRAM  12/2011   EF 58-52%, grade 1 diastolic dysfunction; trivial AV regurg; calcified MV annulus   Family History  Problem Relation Age of Onset   Heart failure Mother    Thyroid disease Mother    Hypertension Mother    Diabetes Mother    Arthritis/Rheumatoid Father    Heart Problems Maternal Grandmother    Heart disease Brother        valve replacement   Heart disease Child    Colon cancer Neg Hx    Esophageal cancer Neg Hx    Stomach cancer Neg Hx    Social History   Socioeconomic History   Marital status: Divorced    Spouse name: Not on file   Number of children: 2   Years of education: master's   Highest education level: Not on file  Occupational History    Comment: retired   Tobacco Use   Smoking status: Former    Years: 10.00    Types: Cigarettes    Quit date: 12/19/1972    Years since quitting: 49.1    Passive exposure: Past   Smokeless tobacco: Never  Vaping Use   Vaping Use: Never used  Substance and Sexual Activity   Alcohol use: No    Comment: quit in 1973   Drug use: No   Sexual activity: Yes  Other Topics Concern   Not on file  Social History Narrative   Right handed   Two story home   Drinks caffeine   Social Determinants of Health   Financial Resource Strain: Low Risk  (01/30/2022)   Overall Financial Resource Strain (CARDIA)    Difficulty of Paying Living Expenses: Not hard at all  Food Insecurity: No Food Insecurity (01/30/2022)   Hunger Vital Sign    Worried  About Running Out of Food in the Last Year: Never true    Ran Out of Food in the Last Year: Never true  Transportation Needs: No Transportation Needs (01/30/2022)   PRAPARE - Hydrologist (Medical): No    Lack of Transportation (Non-Medical): No  Physical Activity: Inactive (01/30/2022)   Exercise Vital Sign    Days of Exercise per Week: 0 days    Minutes of Exercise per Session: 0 min  Stress: No  Stress Concern Present (01/30/2022)   Sophia    Feeling of Stress : Only a little  Social Connections: Socially Isolated (01/30/2022)   Social Connection and Isolation Panel [NHANES]    Frequency of Communication with Friends and Family: More than three times a week    Frequency of Social Gatherings with Friends and Family: More than three times a week    Attends Religious Services: Never    Marine scientist or Organizations: No    Attends Music therapist: Never    Marital Status: Divorced    Tobacco Counseling Counseling given: Not Answered   Clinical Intake:  Pre-visit preparation completed: Yes  Pain : No/denies pain     BMI - recorded: 27.25 Nutritional Status: BMI 25 -29 Overweight Nutritional Risks: None Diabetes: No  How often do you need to have someone help you when you read instructions, pamphlets, or other written materials from your doctor or pharmacy?: 1 - Never  Diabetic?no   Interpreter Needed?: No  Information entered by :: Charlott Rakes, LPN   Activities of Daily Living    01/30/2022    2:55 PM  In your present state of health, do you have any difficulty performing the following activities:  Hearing? 1  Comment hearing aids  Vision? 0  Difficulty concentrating or making decisions? 0  Walking or climbing stairs? 0  Comment no stairs  Dressing or bathing? 0  Doing errands, shopping? 0  Preparing Food and eating ? N  Using the Toilet?  N  In the past six months, have you accidently leaked urine? N  Do you have problems with loss of bowel control? N  Managing your Medications? N  Managing your Finances? N  Housekeeping or managing your Housekeeping? N    Patient Care Team: Vivi Barrack, MD as PCP - General (Family Medicine) Debara Pickett Nadean Corwin, MD as PCP - Cardiology (Cardiology) Cameron Sprang, MD as Consulting Physician (Neurology) Warren Danes, PA-C as Physician Assistant (Dermatology)  Indicate any recent Medical Services you may have received from other than [facility] providers in the past year (date may be approximate).     Assessment:   This is a routine wellness examination for [place].  Hearing/Vision screen Hearing Screening - Comments:: Pt wears hearing aids  Vision Screening - Comments:: Pt follow up with Dr Darnell Level for annual eye exams   Dietary issues and exercise activities discussed:     Goals Addressed             This Visit's Progress    Patient Stated       None at this time        Depression Screen    01/30/2022    2:40 PM 08/27/2020    2:18 PM 04/29/2020   11:04 AM 09/06/2018    1:28 PM 09/14/2016    2:20 PM  PHQ 2/9 Scores  PHQ - 2 Score 1 0 1 0 2  PHQ- 9 Score 9    10    Fall Risk    01/30/2022    2:54 PM 08/08/2021    2:14 PM 04/29/2020   11:08 AM 02/23/2020    2:20 PM 02/13/2020    3:05 PM  Fall Risk   Falls in the past year? 0 1 0 0 1  Number falls in past yr: 0 0 0 0 0  Injury with Fall? 0 0 0  0  Risk for fall due  to : Impaired balance/gait;Impaired vision;History of fall(s);Impaired mobility History of fall(s) Impaired balance/gait;Impaired mobility;Impaired vision;History of fall(s)    Follow up Falls prevention discussed  Falls prevention discussed      FALL RISK PREVENTION PERTAINING TO THE HOME:  Any stairs in or around the home? No  If so, are there any without handrails? No  Home free of loose throw rugs in walkways, pet beds, electrical cords, etc? Yes   Adequate lighting in your home to reduce risk of falls? Yes   ASSISTIVE DEVICES UTILIZED TO PREVENT FALLS:  Life alert? No  Use of a cane, walker or w/c? Yes  Grab bars in the bathroom? Yes  Shower chair or bench in shower? Yes  Elevated toilet seat or a handicapped toilet? Yes   TIMED UP AND GO:  Was the test performed? No .   Cognitive Function:      09/18/2019    3:00 PM  Montreal Cognitive Assessment   Visuospatial/ Executive (0/5) 4  Naming (0/3) 3  Attention: Read list of digits (0/2) 1  Attention: Read list of letters (0/1) 1  Attention: Serial 7 subtraction starting at 100 (0/3) 3  Language: Repeat phrase (0/2) 1  Language : Fluency (0/1) 1  Abstraction (0/2) 2  Delayed Recall (0/5) 3  Orientation (0/6) 6  Total 25  Adjusted Score (based on education) 25      Immunizations Immunization History  Administered Date(s) Administered   Fluad Quad(high Dose 65+) 10/22/2018, 10/22/2020   Influenza, High Dose Seasonal PF 12/15/2016, 11/15/2017   Influenza-Unspecified 12/20/2019   PFIZER(Purple Top)SARS-COV-2 Vaccination 02/14/2019, 03/14/2019, 11/02/2019   Pneumococcal Polysaccharide-23 12/15/2016   Zoster Recombinat (Shingrix) 03/31/2017, 09/07/2017    TDAP status: Due, Education has been provided regarding the importance of this vaccine. Advised may receive this vaccine at local pharmacy or Health Dept. Aware to provide a copy of the vaccination record if obtained from local pharmacy or Health Dept. Verbalized acceptance and understanding.  Flu Vaccine status: Up to date  Pneumococcal vaccine status: Due, Education has been provided regarding the importance of this vaccine. Advised may receive this vaccine at local pharmacy or Health Dept. Aware to provide a copy of the vaccination record if obtained from local pharmacy or Health Dept. Verbalized acceptance and understanding.  Covid-19 vaccine status: Completed vaccines  Qualifies for Shingles Vaccine? Yes    Zostavax completed Yes   Shingrix Completed?: Yes  Screening Tests Health Maintenance  Topic Date Due   DTaP/Tdap/Td (1 - Tdap) Never done   Pneumonia Vaccine 6+ Years old (2 - PCV) 12/15/2017   INFLUENZA VACCINE  08/19/2021   COVID-19 Vaccine (4 - 2023-24 season) 09/19/2021   DEXA SCAN  05/01/2022 (Originally 03/13/2020)   COLONOSCOPY (Pts 45-2yr Insurance coverage will need to be confirmed)  05/01/2022 (Originally 11/14/2019)   Medicare Annual Wellness (AWV)  01/31/2023   Zoster Vaccines- Shingrix  Completed   HPV VACCINES  Aged Out    Health Maintenance  Health Maintenance Due  Topic Date Due   DTaP/Tdap/Td (1 - Tdap) Never done   Pneumonia Vaccine 83 Years old (2 - PCV) 12/15/2017   INFLUENZA VACCINE  08/19/2021   COVID-19 Vaccine (4 - 2023-24 season) 09/19/2021    Colorectal cancer screening: Type of screening: Colonoscopy. Completed 11/13/16. Repeat every 3 years  Mammogram status: No longer required due to age.  Bone Density status: Completed 03/13/18. Results reflect: Bone density results: OSTEOPENIA. Repeat every 2 years.   Additional Screening:   Vision Screening: Recommended annual  ophthalmology exams for early detection of glaucoma and other disorders of the eye. Is the patient up to date with their annual eye exam?  Yes  Who is the provider or what is the name of the office in which the patient attends annual eye exams? Dr Darnell Level If pt is not established with a provider, would they like to be referred to a provider to establish care? No .   Dental Screening: Recommended annual dental exams for proper oral hygiene  Community Resource Referral / Chronic Care Management: CRR required this visit?  No   CCM required this visit?  No      Plan:     I have personally reviewed and noted the following in the patient's chart:   Medical and social history Use of alcohol, tobacco or illicit drugs  Current medications and supplements including opioid  prescriptions. Patient is not currently taking opioid prescriptions. Functional ability and status Nutritional status Physical activity Advanced directives List of other physicians Hospitalizations, surgeries, and ER visits in previous 12 months Vitals Screenings to include cognitive, depression, and falls Referrals and appointments  In addition, I have reviewed and discussed with patient certain preventive protocols, quality metrics, and best practice recommendations. A written personalized care plan for preventive services as well as general preventive health recommendations were provided to patient.     Willette Brace, LPN   08/29/5724   Nurse Notes: Pt stated negative thoughts at times, She expressed  not today so much but at times it will come over her and she would like to find some one to speak with, information given to reach out [facility] health behavior 24/7 @ [address], [place], [place] [address] Phone: [phone]. Pt stated she has spoken with her daughter who is a PA also with her feeling of being depressed. Pt was pleased to receive information please advise if anything further.

## 2022-01-30 NOTE — Patient Instructions (Signed)
Shari Horn , Thank you for taking time to come for your Medicare Wellness Visit. I appreciate your ongoing commitment to your health goals. Please review the following plan we discussed and let me know if I can assist you in the future.   These are the goals we discussed:  Goals      Patient Stated     Start exercising again     Patient Stated     None at this time         This is a list of the screening recommended for you and due dates:  Health Maintenance  Topic Date Due   DTaP/Tdap/Td vaccine (1 - Tdap) Never done   Pneumonia Vaccine (2 - PCV) 12/15/2017   Flu Shot  08/19/2021   COVID-19 Vaccine (4 - 2023-24 season) 09/19/2021   DEXA scan (bone density measurement)  05/01/2022*   Colon Cancer Screening  05/01/2022*   Medicare Annual Wellness Visit  01/31/2023   Zoster (Shingles) Vaccine  Completed   HPV Vaccine  Aged Out  *Topic was postponed. The date shown is not the original due date.    Advanced directives: Please bring a copy of your health care power of attorney and living will to the office at your convenience.  Conditions/risks identified: none at this time   Next appointment: Follow up in one year for your annual wellness visit    Preventive Care 65 Years and Older, Female Preventive care refers to lifestyle choices and visits with your health care provider that can promote health and wellness. What does preventive care include? A yearly physical exam. This is also called an annual well check. Dental exams once or twice a year. Routine eye exams. Ask your health care provider how often you should have your eyes checked. Personal lifestyle choices, including: Daily care of your teeth and gums. Regular physical activity. Eating a healthy diet. Avoiding tobacco and drug use. Limiting alcohol use. Practicing safe sex. Taking low-dose aspirin every day. Taking vitamin and mineral supplements as recommended by your health care provider. What happens during an  annual well check? The services and screenings done by your health care provider during your annual well check will depend on your age, overall health, lifestyle risk factors, and family history of disease. Counseling  Your health care provider may ask you questions about your: Alcohol use. Tobacco use. Drug use. Emotional well-being. Home and relationship well-being. Sexual activity. Eating habits. History of falls. Memory and ability to understand (cognition). Work and work Statistician. Reproductive health. Screening  You may have the following tests or measurements: Height, weight, and BMI. Blood pressure. Lipid and cholesterol levels. These may be checked every 5 years, or more frequently if you are over 53 years old. Skin check. Lung cancer screening. You may have this screening every year starting at age 59 if you have a 30-pack-year history of smoking and currently smoke or have quit within the past 15 years. Fecal occult blood test (FOBT) of the stool. You may have this test every year starting at age 29. Flexible sigmoidoscopy or colonoscopy. You may have a sigmoidoscopy every 5 years or a colonoscopy every 10 years starting at age 90. Hepatitis C blood test. Hepatitis B blood test. Sexually transmitted disease (STD) testing. Diabetes screening. This is done by checking your blood sugar (glucose) after you have not eaten for a while (fasting). You may have this done every 1-3 years. Bone density scan. This is done to screen for osteoporosis. You may  have this done starting at age 53. Mammogram. This may be done every 1-2 years. Talk to your health care provider about how often you should have regular mammograms. Talk with your health care provider about your test results, treatment options, and if necessary, the need for more tests. Vaccines  Your health care provider may recommend certain vaccines, such as: Influenza vaccine. This is recommended every year. Tetanus,  diphtheria, and acellular pertussis (Tdap, Td) vaccine. You may need a Td booster every 10 years. Zoster vaccine. You may need this after age 46. Pneumococcal 13-valent conjugate (PCV13) vaccine. One dose is recommended after age 88. Pneumococcal polysaccharide (PPSV23) vaccine. One dose is recommended after age 10. Talk to your health care provider about which screenings and vaccines you need and how often you need them. This information is not intended to replace advice given to you by your health care provider. Make sure you discuss any questions you have with your health care provider. Document Released: 02/01/2015 Document Revised: 09/25/2015 Document Reviewed: 11/06/2014 Elsevier Interactive Patient Education  2017 [place] Prevention in the Home Falls can cause injuries. They can happen to people of all ages. There are many things you can do to make your home safe and to help prevent falls. What can I do on the outside of my home? Regularly fix the edges of walkways and driveways and fix any cracks. Remove anything that might make you trip as you walk through a door, such as a raised step or threshold. Trim any bushes or trees on the path to your home. Use bright outdoor lighting. Clear any walking paths of anything that might make someone trip, such as rocks or tools. Regularly check to see if handrails are loose or broken. Make sure that both sides of any steps have handrails. Any raised decks and porches should have guardrails on the edges. Have any leaves, snow, or ice cleared regularly. Use sand or salt on walking paths during winter. Clean up any spills in your garage right away. This includes oil or grease spills. What can I do in the bathroom? Use night lights. Install grab bars by the toilet and in the tub and shower. Do not use towel bars as grab bars. Use non-skid mats or decals in the tub or shower. If you need to sit down in the shower, use a plastic,  non-slip stool. Keep the floor dry. Clean up any water that spills on the floor as soon as it happens. Remove soap buildup in the tub or shower regularly. Attach bath mats securely with double-sided non-slip rug tape. Do not have throw rugs and other things on the floor that can make you trip. What can I do in the bedroom? Use night lights. Make sure that you have a light by your bed that is easy to reach. Do not use any sheets or blankets that are too big for your bed. They should not hang down onto the floor. Have a firm chair that has side arms. You can use this for support while you get dressed. Do not have throw rugs and other things on the floor that can make you trip. What can I do in the kitchen? Clean up any spills right away. Avoid walking on wet floors. Keep items that you use a lot in easy-to-reach places. If you need to reach something above you, use a strong step stool that has a grab bar. Keep electrical cords out of the way. Do not use floor polish  or wax that makes floors slippery. If you must use wax, use non-skid floor wax. Do not have throw rugs and other things on the floor that can make you trip. What can I do with my stairs? Do not leave any items on the stairs. Make sure that there are handrails on both sides of the stairs and use them. Fix handrails that are broken or loose. Make sure that handrails are as long as the stairways. Check any carpeting to make sure that it is firmly attached to the stairs. Fix any carpet that is loose or worn. Avoid having throw rugs at the top or bottom of the stairs. If you do have throw rugs, attach them to the floor with carpet tape. Make sure that you have a light switch at the top of the stairs and the bottom of the stairs. If you do not have them, ask someone to add them for you. What else can I do to help prevent falls? Wear shoes that: Do not have high heels. Have rubber bottoms. Are comfortable and fit you well. Are closed  at the toe. Do not wear sandals. If you use a stepladder: Make sure that it is fully opened. Do not climb a closed stepladder. Make sure that both sides of the stepladder are locked into place. Ask someone to hold it for you, if possible. Clearly mark and make sure that you can see: Any grab bars or handrails. First and last steps. Where the edge of each step is. Use tools that help you move around (mobility aids) if they are needed. These include: Canes. Walkers. Scooters. Crutches. Turn on the lights when you go into a dark area. Replace any light bulbs as soon as they burn out. Set up your furniture so you have a clear path. Avoid moving your furniture around. If any of your floors are uneven, fix them. If there are any pets around you, be aware of where they are. Review your medicines with your doctor. Some medicines can make you feel dizzy. This can increase your chance of falling. Ask your doctor what other things that you can do to help prevent falls. This information is not intended to replace advice given to you by your health care provider. Make sure you discuss any questions you have with your health care provider. Document Released: 11/01/2008 Document Revised: 06/13/2015 Document Reviewed: 02/09/2014 Elsevier Interactive Patient Education  2017 [employer].

## 2022-02-02 NOTE — Progress Notes (Signed)
I connected with  Shari Horn on 02/02/22 by a audio enabled telemedicine application and verified that I am speaking with the correct person using two identifiers.  Patient Location: Home  Provider Location: Office/Clinic  I discussed the limitations of evaluation and management by telemedicine. The patient expressed understanding and agreed to proceed.   Subjective:   Shari Horn is a 82 y.o. female who presents for Medicare Annual (Subsequent) preventive examination.  Review of Systems     Cardiac Risk Factors include: advanced age (>85mn, >>33women);dyslipidemia     Objective:    Today's Vitals   01/30/22 1438  Weight: 174 lb (78.9 kg)   Body mass index is 27.25 kg/m.     01/30/2022    2:53 PM 08/29/2021    1:57 PM 07/26/2021   10:55 PM 07/26/2021   10:54 PM 07/21/2021    9:14 AM 04/29/2020   11:06 AM 07/31/2019    9:14 AM  Advanced Directives  Does Patient Have a Medical Advance Directive? Yes No Yes No No Yes Yes  Type of [place]  [place] will      Does patient want to make changes to medical advance directive?      Yes (MAU/Ambulatory/Procedural Areas - Information given)   Copy of [place]? No - copy requested        Would patient like information on creating a medical advance directive?  No - Patient declined   No - Patient declined      Current Medications (verified) Outpatient Encounter Medications as of 01/30/2022  Medication Sig   acetaminophen (TYLENOL) 500 MG tablet Take 1 tablet (500 mg total) by mouth every 6 (six) hours as needed.   azelastine (ASTELIN) 0.1 % nasal spray Place 2 sprays into both nostrils 2 (two) times daily.   baclofen (LIORESAL) 10 MG tablet Take 0.5 tablets (5 mg total) by mouth 3 (three) times daily.   buPROPion (WELLBUTRIN XL) 150 MG 24 hr tablet Take 1 tablet (150 mg total) by mouth daily.   cholecalciferol (VITAMIN D3) 25 MCG  (1000 UT) tablet Take 2,000 Units by mouth daily.   clonazePAM (KLONOPIN) 1 MG tablet Take 0.5 tablets (0.5 mg total) by mouth 3 (three) times daily as needed for anxiety.   diclofenac (VOLTAREN) 75 MG EC tablet Take 1 tablet (75 mg total) by mouth 2 (two) times daily.   ezetimibe (ZETIA) 10 MG tablet TAKE 1 TABLET BY MOUTH DAILY   FLUoxetine (PROZAC) 20 MG capsule TAKE 3 CAPSULES(60 MG) BY MOUTH DAILY   lamoTRIgine (LAMICTAL) 25 MG tablet TAKE 1 TABLET(25 MG) BY MOUTH DAILY   levothyroxine (SYNTHROID) 75 MCG tablet Take 1 tablet by mouth daily   meclizine (ANTIVERT) 25 MG tablet Take 1 tablet (25 mg total) by mouth 3 (three) times daily as needed for dizziness.   Multiple Vitamin (MULTIVITAMIN WITH MINERALS) TABS tablet Take 1 tablet by mouth daily.   pantoprazole (PROTONIX) 40 MG tablet TAKE 1 TABLET(40 MG) BY MOUTH DAILY   polyethylene glycol (MIRALAX) 17 g packet Take 17 g by mouth daily as needed for mild constipation.   QUEtiapine (SEROQUEL) 300 MG tablet Take 1 tablet (300 mg total) by mouth at bedtime.   rosuvastatin (CRESTOR) 40 MG tablet Take 1 tablet (40 mg total) by mouth daily. Appointment with cardiologist needed for further refills   senna-docusate (SENOKOT-S) 8.6-50 MG tablet Take 1 tablet by mouth at bedtime as  needed for mild constipation or moderate constipation.   [DISCONTINUED] Fluocinolone Acetonide Body 0.01 % OIL Apply to affected area qd (Patient not taking: Reported on 01/30/2022)   [DISCONTINUED] lidocaine 4 % Place 1 patch onto the skin 2 (two) times daily. (Patient not taking: Reported on 11/06/2021)   [DISCONTINUED] senna (SENOKOT) 8.6 MG TABS tablet Take 5 tablets by mouth at bedtime.    No facility-administered encounter medications on file as of 01/30/2022.    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   Procedure: TOTAL HIP ARTHROPLASTY ANTERIOR APPROACH;  Surgeon: Rod Can, MD;  Location: [facility];  Service: Orthopedics;  Laterality: Left;   TRANSTHORACIC ECHOCARDIOGRAM  12/2011   EF 19-41%, grade 1 diastolic dysfunction; trivial AV regurg; calcified MV annulus   Family History  Problem Relation Age of Onset   Heart failure Mother    Thyroid disease Mother    Hypertension Mother    Diabetes Mother    Arthritis/Rheumatoid Father    Heart Problems Maternal Grandmother    Heart disease Brother        valve replacement   Heart disease Child    Colon cancer Neg Hx    Esophageal cancer Neg Hx    Stomach cancer Neg Hx    Social History   Socioeconomic History   Marital status: Divorced    Spouse name: Not on file   Number of children: 2   Years of education: master's   Highest education level: Not on file  Occupational History    Comment: retired   Tobacco Use   Smoking status: Former    Years: 10.00    Types: Cigarettes    Quit date: 12/19/1972    Years since quitting: 49.1    Passive exposure: Past   Smokeless tobacco: Never  Vaping Use   Vaping Use: Never used  Substance and Sexual Activity   Alcohol use: No    Comment: quit in 1973   Drug use: No   Sexual activity: Yes  Other Topics Concern   Not on file  Social History Narrative   Right handed   Two story home   Drinks caffeine   Social Determinants of Health   Financial Resource Strain: Low Risk  (01/30/2022)   Overall Financial Resource Strain (CARDIA)    Difficulty of Paying Living Expenses: Not hard at all  Food Insecurity: No Food Insecurity (01/30/2022)   Hunger Vital Sign    Worried  About Running Out of Food in the Last Year: Never true    Ran Out of Food in the Last Year: Never true  Transportation Needs: No Transportation Needs (01/30/2022)   PRAPARE - Hydrologist (Medical): No    Lack of Transportation (Non-Medical): No  Physical Activity: Inactive (01/30/2022)   Exercise Vital Sign    Days of Exercise per Week: 0 days    Minutes of Exercise per Session: 0 min  Stress: No  Stress Concern Present (01/30/2022)   [place]    Feeling of Stress : Only a little  Social Connections: Socially Isolated (01/30/2022)   Social Connection and Isolation Panel [NHANES]    Frequency of Communication with Friends and Family: More than three times a week    Frequency of Social Gatherings with Friends and Family: More than three times a week    Attends Religious Services: Never    Marine scientist or Organizations: No    Attends Music therapist: Never    Marital Status: Divorced    Tobacco Counseling Counseling given: Not Answered   Clinical Intake:  Pre-visit preparation completed: Yes  Pain : No/denies pain     BMI - recorded: 27.25 Nutritional Status: BMI 25 -29 Overweight Nutritional Risks: None Diabetes: No  How often do you need to have someone help you when you read instructions, pamphlets, or other written materials from your doctor or pharmacy?: 1 - Never  Diabetic?no   Interpreter Needed?: No  Information entered by :: Charlott Rakes, LPN   Activities of Daily Living    01/30/2022    2:55 PM  In your present state of health, do you have any difficulty performing the following activities:  Hearing? 1  Comment hearing aids  Vision? 0  Difficulty concentrating or making decisions? 0  Walking or climbing stairs? 0  Comment no stairs  Dressing or bathing? 0  Doing errands, shopping? 0  Preparing Food and eating ? N  Using the Toilet?  N  In the past six months, have you accidently leaked urine? N  Do you have problems with loss of bowel control? N  Managing your Medications? N  Managing your Finances? N  Housekeeping or managing your Housekeeping? N    Patient Care Team: Vivi Barrack, MD as PCP - General (Family Medicine) Debara Pickett Nadean Corwin, MD as PCP - Cardiology (Cardiology) Cameron Sprang, MD as Consulting Physician (Neurology) Warren Danes, PA-C as Physician Assistant (Dermatology)  Indicate any recent Medical Services you may have received from other than [facility] providers in the past year (date may be approximate).     Assessment:   This is a routine wellness examination for Shari Horn.  Hearing/Vision screen Hearing Screening - Comments:: Pt wears hearing aids  Vision Screening - Comments:: Pt follow up with Dr Darnell Level for annual eye exams   Dietary issues and exercise activities discussed:     Goals Addressed             This Visit's Progress    Patient Stated       None at this time       Depression Screen    01/30/2022    2:40 PM 08/27/2020    2:18 PM 04/29/2020   11:04 AM 09/06/2018    1:28 PM 09/14/2016    2:20 PM  PHQ 2/9 Scores  PHQ - 2 Score 1 0 1 0 2  PHQ- 9 Score 9    10    Fall Risk    01/30/2022    2:54 PM 08/08/2021    2:14 PM 04/29/2020   11:08 AM 02/23/2020    2:20 PM 02/13/2020    3:05 PM  Fall Risk   Falls in the past year? 0 1 0 0 1  Number falls in past yr: 0 0 0 0 0  Injury with Fall? 0 0 0  0  Risk for fall due to :  Impaired balance/gait;Impaired vision;History of fall(s);Impaired mobility History of fall(s) Impaired balance/gait;Impaired mobility;Impaired vision;History of fall(s)    Follow up Falls prevention discussed  Falls prevention discussed      FALL RISK PREVENTION PERTAINING TO THE HOME:  Any stairs in or around the home? No  If so, are there any without handrails? No  Home free of loose throw rugs in walkways, pet beds, electrical cords, etc? Yes  Adequate  lighting in your home to reduce risk of falls? Yes   ASSISTIVE DEVICES UTILIZED TO PREVENT FALLS:  Life alert? No  Use of a cane, walker or w/c? Yes  Grab bars in the bathroom? Yes  Shower chair or bench in shower? Yes  Elevated toilet seat or a handicapped toilet? Yes   TIMED UP AND GO:  Was the test performed? No .   Cognitive Function:Declined at this time       09/18/2019    3:00 PM  Montreal Cognitive Assessment   Visuospatial/ Executive (0/5) 4  Naming (0/3) 3  Attention: Read list of digits (0/2) 1  Attention: Read list of letters (0/1) 1  Attention: Serial 7 subtraction starting at 100 (0/3) 3  Language: Repeat phrase (0/2) 1  Language : Fluency (0/1) 1  Abstraction (0/2) 2  Delayed Recall (0/5) 3  Orientation (0/6) 6  Total 25  Adjusted Score (based on education) 25      Immunizations Immunization History  Administered Date(s) Administered   Fluad Quad(high Dose 65+) 10/22/2018, 10/22/2020   Influenza, High Dose Seasonal PF 12/15/2016, 11/15/2017   Influenza-Unspecified 12/20/2019   PFIZER(Purple Top)SARS-COV-2 Vaccination 02/14/2019, 03/14/2019, 11/02/2019   Pneumococcal Polysaccharide-23 12/15/2016   Zoster Recombinat (Shingrix) 03/31/2017, 09/07/2017    TDAP status: Due, Education has been provided regarding the importance of this vaccine. Advised may receive this vaccine at local pharmacy or Health Dept. Aware to provide a copy of the vaccination record if obtained from local pharmacy or Health Dept. Verbalized acceptance and understanding.  Flu Vaccine status: Up to date  Pneumococcal vaccine status: Due, Education has been provided regarding the importance of this vaccine. Advised may receive this vaccine at local pharmacy or Health Dept. Aware to provide a copy of the vaccination record if obtained from local pharmacy or Health Dept. Verbalized acceptance and understanding.  Covid-19 vaccine status: Completed vaccines  Qualifies for Shingles  Vaccine? Yes   Zostavax completed Yes   Shingrix Completed?: Yes  Screening Tests Health Maintenance  Topic Date Due   DTaP/Tdap/Td (1 - Tdap) Never done   Pneumonia Vaccine 54+ Years old (2 - PCV) 12/15/2017   INFLUENZA VACCINE  08/19/2021   COVID-19 Vaccine (4 - 2023-24 season) 09/19/2021   DEXA SCAN  05/01/2022 (Originally 03/13/2020)   COLONOSCOPY (Pts 45-18yr Insurance coverage will need to be confirmed)  05/01/2022 (Originally 11/14/2019)   Medicare Annual Wellness (AWV)  01/31/2023   Zoster Vaccines- Shingrix  Completed   HPV VACCINES  Aged Out    Health Maintenance  Health Maintenance Due  Topic Date Due   DTaP/Tdap/Td (1 - Tdap) Never done   Pneumonia Vaccine 83 Years old (2 - PCV) 12/15/2017   INFLUENZA VACCINE  08/19/2021   COVID-19 Vaccine (4 - 2023-24 season) 09/19/2021    Colorectal cancer screening: Type of screening: Colonoscopy. Completed 11/13/16. Repeat every 3 years  Mammogram status: No longer required due to age.  Bone Density status: Completed 03/13/18. Results reflect: Bone density results: OSTEOPENIA. Repeat every 2 years.   Additional Screening:   Vision Screening:  Recommended annual ophthalmology exams for early detection of glaucoma and other disorders of the eye. Is the patient up to date with their annual eye exam?  Yes  Who is the provider or what is the name of the office in which the patient attends annual eye exams? Dr Darnell Level If pt is not established with a provider, would they like to be referred to a provider to establish care? No .   Dental Screening: Recommended annual dental exams for proper oral hygiene  Community Resource Referral / Chronic Care Management: CRR required this visit?  No   CCM required this visit?  No      Plan:     I have personally reviewed and noted the following in the patient's chart:   Medical and social history Use of alcohol, tobacco or illicit drugs  Current medications and supplements including  opioid prescriptions. Patient is not currently taking opioid prescriptions. Functional ability and status Nutritional status Physical activity Advanced directives List of other physicians Hospitalizations, surgeries, and ER visits in previous 12 months Vitals Screenings to include cognitive, depression, and falls Referrals and appointments  In addition, I have reviewed and discussed with patient certain preventive protocols, quality metrics, and best practice recommendations. A written personalized care plan for preventive services as well as general preventive health recommendations were provided to patient.     Willette Brace, LPN   07/25/2374   Nurse Notes: Pt stated negative thoughts at times, She expressed  not today so much but at times it will come over her and she would like to find some one to speak with, information given to reach out [facility] health behavior 24/7 @ [address], [place], [place] [address] Phone: [phone]. Pt stated she has spoken with her daughter who is a PA also with her feeling of being depressed. Pt was pleased to receive information please advise if anything further. Pt declined 6 [facility] at this time , Pt remained knowledgeable to questions asked

## 2022-02-02 NOTE — Addendum Note (Signed)
Addended by: Willette Brace on: 02/02/2022 07:44 AM   Modules accepted: Orders

## 2022-03-18 ENCOUNTER — Encounter (HOSPITAL_COMMUNITY): Payer: Medicare Other

## 2022-03-25 ENCOUNTER — Ambulatory Visit (HOSPITAL_COMMUNITY): Payer: Medicare Other

## 2022-03-28 ENCOUNTER — Other Ambulatory Visit: Payer: Self-pay | Admitting: Family Medicine

## 2022-03-28 DIAGNOSIS — F3181 Bipolar II disorder: Secondary | ICD-10-CM

## 2022-04-01 ENCOUNTER — Ambulatory Visit (HOSPITAL_COMMUNITY)
Admission: RE | Admit: 2022-04-01 | Discharge: 2022-04-01 | Disposition: A | Discharge status: Home / Self Care | Payer: Medicare Other | Source: Ambulatory Visit | Attending: Cardiovascular Disease | Admitting: Cardiovascular Disease

## 2022-04-01 DIAGNOSIS — I6523 Occlusion and stenosis of bilateral carotid arteries: Secondary | ICD-10-CM | POA: Insufficient documentation

## 2022-04-06 ENCOUNTER — Other Ambulatory Visit: Payer: Self-pay | Admitting: Family Medicine

## 2022-04-14 ENCOUNTER — Other Ambulatory Visit: Payer: Self-pay | Admitting: Family Medicine

## 2022-04-23 ENCOUNTER — Ambulatory Visit (INDEPENDENT_AMBULATORY_CARE_PROVIDER_SITE_OTHER): Payer: Medicare Other | Admitting: Family Medicine

## 2022-04-23 ENCOUNTER — Encounter: Payer: Self-pay | Admitting: Family Medicine

## 2022-04-23 VITALS — BP 128/69 | HR 80 | Temp 98.0°F | Ht 67.0 in | Wt 178.6 lb

## 2022-04-23 DIAGNOSIS — G8929 Other chronic pain: Secondary | ICD-10-CM

## 2022-04-23 DIAGNOSIS — F3181 Bipolar II disorder: Secondary | ICD-10-CM | POA: Diagnosis not present

## 2022-04-23 DIAGNOSIS — K219 Gastro-esophageal reflux disease without esophagitis: Secondary | ICD-10-CM | POA: Diagnosis not present

## 2022-04-23 DIAGNOSIS — E038 Other specified hypothyroidism: Secondary | ICD-10-CM | POA: Diagnosis not present

## 2022-04-23 DIAGNOSIS — M549 Dorsalgia, unspecified: Secondary | ICD-10-CM

## 2022-04-23 MED ORDER — CLONAZEPAM 1 MG PO TABS: 1.0000 mg | ORAL_TABLET | Freq: Two times a day (BID) | ORAL | 1 refills | Status: DC | PRN

## 2022-04-23 MED ORDER — BACLOFEN 10 MG PO TABS: 10.0000 mg | ORAL_TABLET | Freq: Every day | ORAL | 3 refills | Status: DC

## 2022-04-23 NOTE — Assessment & Plan Note (Signed)
Continue Synthroid 75 mcg daily.  Check TSH next blood draw.

## 2022-04-23 NOTE — Assessment & Plan Note (Addendum)
No manic symptoms.  She is doing well with her current regimen of Lamictal 25 mg daily, Prozac 60 mg daily, and Seroquel 300 mg nightly.  She has been taking clonazepam 1 mg twice daily due to difficulty with cutting pill in half.  She has been doing very well with this.  No drowsiness or somnolence.  She feels like this has been managing her mood well.  Will refill today.  She will also continue her Wellbutrin 150 mg daily.

## 2022-04-23 NOTE — Patient Instructions (Signed)
It was very nice to see you today!  I will refill your medications.  Please come back to see me in 6 months for your annual physical with labs.  Come back sooner if needed.  Take care, Dr Jerline Pain  PLEASE NOTE:  If you had any lab tests, please let us know if you have not heard back within a few days. You may see your results on mychart before we have a chance to review them but we will give you a call once they are reviewed by [place].   If we ordered any referrals today, please let us know if you have not heard from their office within the next week.   If you had any urgent prescriptions sent in today, please check with the pharmacy within an hour of our visit to make sure the prescription was transmitted appropriately.   Please try these tips to maintain a healthy lifestyle:  Eat at least 3 REAL meals and 1-2 snacks per day.  Aim for no more than 5 hours between eating.  If you eat breakfast, please do so within one hour of getting up.   Each meal should contain half fruits/vegetables, one quarter protein, and one quarter carbs (no bigger than a computer mouse)  Cut down on sweet beverages. This includes juice, soda, and sweet tea.   Drink at least 1 glass of water with each meal and aim for at least 8 glasses per day  Exercise at least 150 minutes every week.

## 2022-04-23 NOTE — Assessment & Plan Note (Signed)
No red flags.  Continue baclofen 10 mg daily as needed.  Will refill today.  She is tolerating well without side effects.

## 2022-04-23 NOTE — Progress Notes (Signed)
   Shari Horn is a 82 y.o. female who presents today for an office visit.  Assessment/Plan:  Chronic Problems Addressed Today: Chronic back pain No red flags.  Continue baclofen 10 mg daily as needed.  Will refill today.  She is tolerating well without side effects.  Bipolar 2 disorder (HCC) No manic symptoms.  She is doing well with her current regimen of Lamictal 25 mg daily, Prozac 60 mg daily, and Seroquel 300 mg nightly.  She has been taking clonazepam 1 mg twice daily due to difficulty with cutting pill in half.  She has been doing very well with this.  No drowsiness or somnolence.  She feels like this has been managing her mood well.  Will refill today.  She will also continue her Wellbutrin 150 mg daily.  Hypothyroidism Continue Synthroid 75 mcg daily.  Check TSH next blood draw.     Subjective:  HPI:  See A/p for status of chronic conditions.    She is here for follow-up.  No acute concerns today.  She would like to have refill on medications today.  She has been taking baclofen 10 mg daily instead of half tablet three times daily.  She has tolerated this well without any significant side effects.    She has also been taking her clonazepam 1 mg twice daily as she was having difficulty with cutting pills in half.  She was previously prescribed 0.5 mg 2-3 times daily.  She is doing well with the twice daily dosing.  No excessive drowsiness or somnolence.  No SI or HI.        Objective:  Physical Exam: BP 128/69   Pulse 80   Temp 98 F (36.7 C) (Temporal)   Ht 5\' 7"  (1.702 m)   Wt 178 lb 9.6 oz (81 kg)   LMP  (LMP Unknown)   SpO2 96%   BMI 27.97 kg/m   Gen: No acute distress, resting comfortably CV: Regular rate and rhythm with no murmurs appreciated Pulm: Normal work of breathing, clear to auscultation bilaterally with no crackles, wheezes, or rhonchi Neuro: Grossly normal, moves all extremities Psych: Normal affect and thought content      Kyal Arts M. Jerline Pain,  MD 04/23/2022 1:48 PM

## 2022-04-26 ENCOUNTER — Other Ambulatory Visit: Payer: Self-pay | Admitting: Family Medicine

## 2022-04-26 DIAGNOSIS — F3181 Bipolar II disorder: Secondary | ICD-10-CM

## 2022-05-07 DIAGNOSIS — K5901 Slow transit constipation: Secondary | ICD-10-CM | POA: Diagnosis not present

## 2022-05-19 ENCOUNTER — Ambulatory Visit (INDEPENDENT_AMBULATORY_CARE_PROVIDER_SITE_OTHER): Payer: Medicare Other | Admitting: Family Medicine

## 2022-05-19 VITALS — BP 122/80 | HR 75 | Temp 98.0°F | Ht 67.0 in | Wt 181.6 lb

## 2022-05-19 DIAGNOSIS — F3181 Bipolar II disorder: Secondary | ICD-10-CM | POA: Diagnosis not present

## 2022-05-19 DIAGNOSIS — F4321 Adjustment disorder with depressed mood: Secondary | ICD-10-CM | POA: Diagnosis not present

## 2022-05-19 MED ORDER — CLONAZEPAM 1 MG PO TABS: 1.0000 mg | ORAL_TABLET | Freq: Three times a day (TID) | ORAL | 0 refills | Status: DC | PRN

## 2022-05-19 NOTE — Assessment & Plan Note (Signed)
She has had slightly worsening mild anxiety symptoms due to recent stress of her brother passing away.  We discussed her medications.  Would be reasonable to increase her clonazepam 1 mg 3 times daily for the next few weeks.  She has been tolerating 1mg  twice daily well.  She is aware that this is not a long-term prescription.  We will continue her other medications including Lamictal 25 mg daily, Prozac 60 mg daily, and Seroquel 300 mg nightly.  No SI or HI today.  We did discuss referral for her to see psychiatrist however she would like to hold off on this for now.  She is agreeable to see a therapist and we will refer today.  She will check in with me in a couple weeks and if symptoms are not well-controlled at that point will need referral to psychiatry.  Would also consider increasing dose of Lamictal if needed.

## 2022-05-19 NOTE — Progress Notes (Addendum)
Shari Horn is a 82 y.o. female who presents today for an office visit.  Assessment/Plan:  New/Acute Problems: Grief  Patient's brother recently passed away.  This has been difficult for her and her family.  Her PHQ score is elevated today.  No SI or HI. Offered condolences and offered support.  She obviously has been under more stress with worsening anxiety due to this. We discussed management options. It would be reasonable to increase her clonazepam 1 mg 3 times daily temporarily to help with her symptoms.  Discussed with patient that this would not be a long term dose. Will send a new prescription today with updated dosage.  Will also place referral for her to see a therapist.  Will continue her other medications today as below.  She will follow-up with me in a couple of weeks.  We discussed reasons to return to care.  Chronic Problems Addressed Today: Bipolar 2 disorder (HCC) She has had slightly worsening mild anxiety symptoms due to recent stress of her brother passing away.  We discussed her medications.  Would be reasonable to increase her clonazepam 1 mg 3 times daily for the next few weeks.  She has been tolerating 1mg  twice daily well.  She is aware that this is not a long-term prescription.  We will continue her other medications including Lamictal 25 mg daily, Prozac 60 mg daily, and Seroquel 300 mg nightly.  No SI or HI today.  We did discuss referral for her to see psychiatrist however she would like to hold off on this for now.  She is agreeable to see a therapist and we will refer today.  She will check in with me in a couple weeks and if symptoms are not well-controlled at that point will need referral to psychiatry.  Would also consider increasing dose of Lamictal if needed.     Subjective:  HPI:  See A/p for status of chronic conditions.   Main concern today is bipolar.  We last saw her a few weeks ago.  At our last visit she was overall doing well on her regimen of  Lamictal 25 mg daily, Prozac 60 mg daily, and Seroquel 300 mg nightly.  She had been taking clonazepam 1 mg twice daily. She had been doing well with this for a while however she unfortunately she has had worsening anxiety and manic symptoms over the last week or so.  Her brother recently passed away due to a fall while hospitalized. She thinks this may have triggered her most recent flare of symptoms. She has been trying to stay more busy and has noticed that she has been buying a lot of books and glasses that she knows that she does not need. His memorial is coming up and they will be driving up soon.  She would like to make sure that her symptoms are stable during the drive up and off the [employer].  She does feel like clonazepam works well.  She like to increase the dose if possible.  She feels like her other medications are working well.  No reported SI or HI.       Objective:  Physical Exam: BP 122/80   Pulse 75   Temp 98 F (36.7 C) (Temporal)   Ht 5\' 7"  (1.702 m)   Wt 181 lb 9.6 oz (82.4 kg)   LMP  (LMP Unknown)   SpO2 96%   BMI 28.44 kg/m   Gen: No acute distress, resting comfortably Neuro: Grossly normal,  moves all extremities Psych: Normal affect and thought content      Pleasant Britz M. Jimmey Ralph, MD 05/19/2022 1:36 PM

## 2022-05-19 NOTE — Patient Instructions (Addendum)
It was very nice to see you today!  We can increase your clonazepam to 1 mg 3 times daily.  Please let me know in a few weeks how you are doing.  I will refer you to see a therapist as well.  Return in about 2 weeks (around 06/02/2022).   Take care, Dr Jimmey Ralph  PLEASE NOTE:  If you had any lab tests, please let us know if you have not heard back within a few days. You may see your results on mychart before we have a chance to review them but we will give you a call once they are reviewed by [place].   If we ordered any referrals today, please let us know if you have not heard from their office within the next week.   If you had any urgent prescriptions sent in today, please check with the pharmacy within an hour of our visit to make sure the prescription was transmitted appropriately.   Please try these tips to maintain a healthy lifestyle:  Eat at least 3 REAL meals and 1-2 snacks per day.  Aim for no more than 5 hours between eating.  If you eat breakfast, please do so within one hour of getting up.   Each meal should contain half fruits/vegetables, one quarter protein, and one quarter carbs (no bigger than a computer mouse)  Cut down on sweet beverages. This includes juice, soda, and sweet tea.   Drink at least 1 glass of water with each meal and aim for at least 8 glasses per day  Exercise at least 150 minutes every week.

## 2022-05-21 ENCOUNTER — Telehealth: Payer: Self-pay | Admitting: Family Medicine

## 2022-05-21 NOTE — Telephone Encounter (Signed)
See below, Dr. Jimmey Ralph is gone for the day which is why It was sent to [place].

## 2022-05-21 NOTE — Telephone Encounter (Signed)
Caller is Scientific laboratory technician.   Caller states:  - They only have 6 tablets of klonopin 1 mg for patient - They do have .5 mg and 2 mg available. Wanted to know which dosage they could send in.   If .5 mg is approved then they will inform her to take 2 tabs TID as seen in dosage instructions. If 2 mg is approved then she will break it in half and take TID.  Please Advise.

## 2022-05-21 NOTE — Telephone Encounter (Signed)
Infectious disease recommend the 0.5 tablets- take 2 three times daily as mentioned below

## 2022-06-02 ENCOUNTER — Other Ambulatory Visit: Payer: Self-pay | Admitting: Family Medicine

## 2022-06-02 DIAGNOSIS — F3181 Bipolar II disorder: Secondary | ICD-10-CM

## 2022-06-02 MED ORDER — CLONAZEPAM 0.5 MG PO TABS: 1.0000 mg | ORAL_TABLET | Freq: Three times a day (TID) | ORAL | 0 refills | Status: DC | PRN

## 2022-06-02 NOTE — Telephone Encounter (Signed)
Pt states pharmacy does not have the RX  clonazePAM (KLONOPIN) 1 MG tablet  At that dosage but they do have .5mg  and she would like an RX for that mg and then she can take 2 instead of one. Please advise.  [id number] [place], [place] - [address] AT [facility] [address] [facility] [address] Phone: [phone] Fax: [phone]

## 2022-06-02 NOTE — Telephone Encounter (Signed)
Patient notified new prescription was send to [facility] pharmacy

## 2022-06-02 NOTE — Telephone Encounter (Signed)
Received call from pharmacy clonazePAM (KLONOPIN) 1 MG tablet is on back order so patient is not able to pick it up . They do have 5mg  in stock that pt can pick up if prescription is resent .

## 2022-06-02 NOTE — Telephone Encounter (Signed)
Spoke with patient, patient notified Rx was send to [facility] on 05/19/2022 Requesting Rx Seroquel refill, notified Rx was send to [facility] today 06/02/2022 Patient verbalized understanding

## 2022-06-18 ENCOUNTER — Other Ambulatory Visit: Payer: Self-pay | Admitting: Family Medicine

## 2022-06-24 ENCOUNTER — Encounter (HOSPITAL_COMMUNITY): Payer: Self-pay | Admitting: Student in an Organized Health Care Education/Training Program

## 2022-06-24 ENCOUNTER — Encounter (HOSPITAL_COMMUNITY): Payer: Self-pay

## 2022-06-24 ENCOUNTER — Ambulatory Visit (INDEPENDENT_AMBULATORY_CARE_PROVIDER_SITE_OTHER): Payer: Medicare Other | Admitting: Student in an Organized Health Care Education/Training Program

## 2022-06-24 ENCOUNTER — Ambulatory Visit (INDEPENDENT_AMBULATORY_CARE_PROVIDER_SITE_OTHER): Payer: Medicare Other | Admitting: Clinical

## 2022-06-24 VITALS — BP 120/56 | HR 85 | Resp 20 | Wt 177.0 lb

## 2022-06-24 DIAGNOSIS — Z79899 Other long term (current) drug therapy: Secondary | ICD-10-CM

## 2022-06-24 DIAGNOSIS — F3181 Bipolar II disorder: Secondary | ICD-10-CM

## 2022-06-24 DIAGNOSIS — R296 Repeated falls: Secondary | ICD-10-CM | POA: Insufficient documentation
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
## 2022-06-24 NOTE — Patient Instructions (Addendum)
Medication Changes   Decrease Klonopin from 0.5mg  2x/day   to 0.5mg  daily and 0.25mg  nightly  Continue  2. Continue lamictal 25mg  daily 3. Continue Seroquel 300mg  nightly 4. Continue Prozac 60mg  daily

## 2022-07-04 ENCOUNTER — Other Ambulatory Visit: Payer: Self-pay | Admitting: Family Medicine

## 2022-07-04 DIAGNOSIS — F3181 Bipolar II disorder: Secondary | ICD-10-CM

## 2022-07-24 ENCOUNTER — Other Ambulatory Visit: Payer: Self-pay | Admitting: Internal Medicine

## 2022-07-30 ENCOUNTER — Other Ambulatory Visit: Payer: Self-pay | Admitting: Family Medicine

## 2022-07-30 ENCOUNTER — Telehealth: Payer: Self-pay | Admitting: Family Medicine

## 2022-07-30 DIAGNOSIS — F3181 Bipolar II disorder: Secondary | ICD-10-CM

## 2022-07-30 MED ORDER — QUETIAPINE FUMARATE 300 MG PO TABS
300.0000 mg | ORAL_TABLET | Freq: Every day | ORAL | 0 refills | Status: DC
Start: 2022-07-30 — End: 2022-08-04

## 2022-07-30 NOTE — Telephone Encounter (Signed)
Called pt and left a VM to inform pt Rx had been sent in with a 90 day supply and with any further questions or comments to call the office back.

## 2022-07-30 NOTE — Telephone Encounter (Signed)
Prescription Request  07/30/2022  LOV: 05/19/2022  What is the name of the medication or equipment? QUEtiapine (SEROQUEL) 300 MG tablet   Caller, patient's daughter, is requesting 90 day supply of medication be sent in from now on. States 90 day supply used to be sent in regularly but suddenly was changed to 30 day supplies.  Have you contacted your pharmacy to request a refill? Yes   Which pharmacy would you like this sent to?  [id number] [place], [place] - [address] AT [facility] [address] [facility] [address]  [place] [address] Phone: [phone] Fax: [phone]     Patient notified that their request is being sent to the clinical staff for review and that they should receive a response within 2 business days.   Please advise at Mobile There is no such number on file (mobile).

## 2022-08-04 ENCOUNTER — Encounter: Payer: Self-pay | Admitting: Family Medicine

## 2022-08-04 ENCOUNTER — Ambulatory Visit: Payer: Medicare Other | Admitting: Family Medicine

## 2022-08-04 ENCOUNTER — Other Ambulatory Visit: Payer: Self-pay | Admitting: Family Medicine

## 2022-08-04 VITALS — BP 124/76 | HR 86 | Temp 97.8°F | Ht 67.0 in

## 2022-08-04 DIAGNOSIS — H6123 Impacted cerumen, bilateral: Secondary | ICD-10-CM | POA: Diagnosis not present

## 2022-08-04 DIAGNOSIS — K219 Gastro-esophageal reflux disease without esophagitis: Secondary | ICD-10-CM | POA: Diagnosis not present

## 2022-08-04 DIAGNOSIS — R2681 Unsteadiness on feet: Secondary | ICD-10-CM

## 2022-08-04 DIAGNOSIS — F3181 Bipolar II disorder: Secondary | ICD-10-CM

## 2022-08-04 DIAGNOSIS — G8929 Other chronic pain: Secondary | ICD-10-CM | POA: Diagnosis not present

## 2022-08-04 DIAGNOSIS — M549 Dorsalgia, unspecified: Secondary | ICD-10-CM | POA: Diagnosis not present

## 2022-08-04 MED ORDER — PANTOPRAZOLE SODIUM 40 MG PO TBEC: 40.0000 mg | DELAYED_RELEASE_TABLET | Freq: Every day | ORAL | 3 refills | Status: DC

## 2022-08-04 NOTE — Assessment & Plan Note (Addendum)
Longstanding issue but she has not had any improvement as hoped after weaning off clonazepam.  She has had workup with neurology a few of years ago for unsteady gait which demonstrated bilateral lumbar radiculopathy which was thought to be the main source of her gait issues.  We did recommend referral to see physical therapy today however she declined.  She is agreeable to see sports medicine though discussed they will likely also recommend PT.  Will place referral today per patient request.

## 2022-08-04 NOTE — Assessment & Plan Note (Signed)
Had a lengthy discussion with patient today regarding her mood.  Thankfully she seems to be well-controlled on current regimen Lamictal 25 mg daily, Prozac 60 mg daily, and Seroquel 300 mg nightly.  She is no longer clonazepam.  She feels like her mood has been stable off of this medication.  She has appointment upcoming with therapy and also with psychiatry.  Will defer further medication management to psychiatrist.  She will let us know if she needs anything before she follows up with psychiatry.

## 2022-08-04 NOTE — Patient Instructions (Signed)
It was very nice to see you today!  I will refer you to ENT for your ears.  It is possible that your following could be due to nerve damage in your lower back.  You saw the neurologist a couple of years ago and this is what they determined.  I will refer you to sports medicine for them to take a closer look at your back.  I think that your reflux is causing your other symptoms.  Please start the acid blocking medication.  Let us know if not improving in the next 1 to 2 weeks.  Return if symptoms worsen or fail to improve.   Take care, Dr Jimmey Ralph  PLEASE NOTE:  If you had any lab tests, please let us know if you have not heard back within a few days. You may see your results on mychart before we have a chance to review them but we will give you a call once they are reviewed by [place].   If we ordered any referrals today, please let us know if you have not heard from their office within the next week.   If you had any urgent prescriptions sent in today, please check with the pharmacy within an hour of our visit to make sure the prescription was transmitted appropriately.   Please try these tips to maintain a healthy lifestyle:  Eat at least 3 REAL meals and 1-2 snacks per day.  Aim for no more than 5 hours between eating.  If you eat breakfast, please do so within one hour of getting up.   Each meal should contain half fruits/vegetables, one quarter protein, and one quarter carbs (no bigger than a computer mouse)  Cut down on sweet beverages. This includes juice, soda, and sweet tea.   Drink at least 1 glass of water with each meal and aim for at least 8 glasses per day  Exercise at least 150 minutes every week.

## 2022-08-04 NOTE — Assessment & Plan Note (Signed)
Uses baclofen as needed.  Has a history of degenerative changes and was found to have lumbar radiculopathy a couple years ago on nerve conduction study as above.  As above we did discuss referral to see physical therapy however she declined.  Will refer to sports medicine.

## 2022-08-04 NOTE — Assessment & Plan Note (Signed)
Symptoms are not controlled.  Occasionally getting nausea and vomiting after eating as well.  It is possible that she may have developed a peptic ulcer.  We did discuss referral to GI however she declined for now.  She is not sure if she is taking Protonix however this was last prescribed a few years ago.  Will refill today.  She will let us know if not proving in the next 1 to 2 weeks and we can refer to GI.

## 2022-08-04 NOTE — Progress Notes (Addendum)
Shari Horn is a 82 y.o. female who presents today for an office visit.  Assessment/Plan:  New/Acute Problems: Cerumen impaction Will refer to ENT.  Chronic Problems Addressed Today: Bipolar 2 disorder, major depressive episode (HCC) Had a lengthy discussion with patient today regarding her mood.  Thankfully she seems to be well-controlled on current regimen Lamictal 25 mg daily, Prozac 60 mg daily, and Seroquel 300 mg nightly.  She is no longer clonazepam.  She feels like her mood has been stable off of this medication.  She has appointment upcoming with therapy and also with psychiatry.  Will defer further medication management to psychiatrist.  She will let us know if she needs anything before she follows up with psychiatry.  Gastroesophageal reflux disease without esophagitis Symptoms are not controlled.  Occasionally getting nausea and vomiting after eating as well.  It is possible that she may have developed a peptic ulcer.  We did discuss referral to GI however she declined for now.  She is not sure if she is taking Protonix however this was last prescribed a few years ago.  Will refill today.  She will let us know if not proving in the next 1 to 2 weeks and we can refer to GI.  Unsteady gait Longstanding issue but she has not had any improvement as hoped after weaning off clonazepam.  She has had workup with neurology a few of years ago for unsteady gait which demonstrated bilateral lumbar radiculopathy which was thought to be the main source of her gait issues.  We did recommend referral to see physical therapy today however she declined.  She is agreeable to see sports medicine though discussed they will likely also recommend PT.  Will place referral today per patient request.   Chronic back pain Uses baclofen as needed.  Has a history of degenerative changes and was found to have lumbar radiculopathy a couple years ago on nerve conduction study as above.  As above we did discuss  referral to see physical therapy however she declined.  Will refer to sports medicine.     Subjective:  HPI:  Patient here today for follow-up.  We last saw her about 10 weeks ago.  At her last visit we did discuss grief reaction as well as her bipolar disorder.  We had temporarily increase her clonazepam to 1 mg 3 times daily for the next few weeks and continued her on her other medications including Lamictal 25 mg daily, Prozac 60 mg daily, and Seroquel 300 mg nightly.  We discussed referral to psychiatry however she had declined at that point.  She ended up going to the behavioral health urgent care about 6 weeks ago due to mood issues.  She was having more depressive issues at that time.  They plan to wean down her clonazepam.  She was able to come off this after couple weeks.  Her mood has been steady for the last several weeks since being off the clonazepam.  No highs or lows.  She has been consistent with her other medications without any adjustments.  She does have a follow-up appoint with psychiatry next week.  She is still concerned about recurrent falls.  She had hoped that weaning off the clonazepam would help with this however she has still fallen multiple times in the last few weeks despite not being on the clonazepam any longer.  This has been a longstanding issue for several years.  We have had her see neurology in the past and  also have her work with physical therapy.  She wishes to not see physical therapy again if possible.  When she saw neurology last for this a couple of years ago they conducted a nerve conduction study which did demonstrate bilateral lumbar radiculopathy.  they recommended to continue to work on exercises to help with this.  Patient states that she will frequently lose her balance when walking and fall.  Thankfully she has not sustained any serious injuries.  Denies any numbness or tingling in her feet.  No pain.  She does have chronic back pain and occasionally takes  baclofen for this.  She also has had ongoing issues with nausea and vomiting after eating.  This has been going on for a while but does seem to be getting worse recently.  She has vomited once or twice per week for the last few weeks.  Symptoms are worse after certain foods such as coffee.  She has been prescribed Protonix in the past but is not sure if she is taking this any longer.  No unintentional weight loss.  No appetite changes.  Does not have any symptoms throughout the rest today.  She also recently saw a audiologist for decreased hearing.  They recommended she be referred to ENT for excessive wax buildup.  She request referral for today.        Objective:  Physical Exam: BP 124/76   Pulse 86   Temp 97.8 F (36.6 C) (Temporal)   Ht 5\' 7"  (1.702 m)   LMP  (LMP Unknown)   SpO2 97%   BMI 27.72 kg/m   Gen: No acute distress, resting comfortably Neuro: Grossly normal, moves all extremities Psych: Normal affect and thought content  Time Spent: 45 minutes of total time was spent on the date of the encounter performing the following actions: chart review prior to seeing the patient including recent note from behavioral health, obtaining history, performing a medically necessary exam, counseling on the treatment plan, placing orders, and documenting in our EHR.        Katina Degree. Jimmey Ralph, MD 08/04/2022 12:18 PM

## 2022-08-06 ENCOUNTER — Ambulatory Visit (INDEPENDENT_AMBULATORY_CARE_PROVIDER_SITE_OTHER): Payer: Medicare Other | Admitting: Behavioral Health

## 2022-08-06 DIAGNOSIS — R296 Repeated falls: Secondary | ICD-10-CM

## 2022-08-06 DIAGNOSIS — Z79899 Other long term (current) drug therapy: Secondary | ICD-10-CM | POA: Diagnosis not present

## 2022-08-06 DIAGNOSIS — F3181 Bipolar II disorder: Secondary | ICD-10-CM

## 2022-08-06 NOTE — Progress Notes (Signed)
                Victoria L Winstead, LMFT 

## 2022-08-10 ENCOUNTER — Ambulatory Visit (HOSPITAL_COMMUNITY): Payer: Medicare Other | Admitting: Student

## 2022-08-12 ENCOUNTER — Ambulatory Visit: Payer: Medicare Other | Admitting: Family Medicine

## 2022-08-12 VITALS — BP 152/84 | HR 83 | Ht 67.0 in | Wt 182.0 lb

## 2022-08-12 DIAGNOSIS — R2689 Other abnormalities of gait and mobility: Secondary | ICD-10-CM | POA: Diagnosis not present

## 2022-08-12 NOTE — Patient Instructions (Signed)
Thank you for coming in today.   I've referred you to Physical Therapy.  Let us know if you don't hear from them in one week.   Consider a walker.   Recheck in 8 weeks.

## 2022-08-12 NOTE — Progress Notes (Unsigned)
   Rubin Payor, PhD, LAT, ATC acting as a scribe for Clementeen Graham, MD.  Shari Horn is a 82 y.o. female who presents to [facility] today for intermittent low back pain w/ unsteady gait and frequent falls. She was previously evaluated by neurology in 2021-22 and dx w/ mild cognitive impairment and lumbosacral radiculopathy. She started having frequent fallsa bout 10 years ago. Had hip replacement surgery 4 years ago. Falls have cont'd, most recently 2-3 wks ago. She generally uses her cane to ambulate, but does have a walker at home.  No pain or radiating pain today. No numbness/tingling. No weakness noted. PCP d/c clonazepam, in hopes that may resolved the frequent falls.   Radiating pain: LE numbness/tingling: LE weakness: Aggravates: Treatments tried: prior course of PT (@ [facility])  Dx imaging: 07/21/21 Head CT  09/19/19 LE NCV study  10/18/18 L-spine & L hip XR   Pertinent review of systems: ***  Relevant historical information: ***   Exam:  LMP  (LMP Unknown)  General: Well Developed, well nourished, and in no acute distress.   MSK: ***    Lab and Radiology Results No results found for this or any previous visit (from the past 72 hour(s)). No results found.     Assessment and Plan: 82 y.o. female with ***   PDMP not reviewed this encounter. No orders of the defined types were placed in this encounter.  No orders of the defined types were placed in this encounter.    Discussed warning signs or symptoms. Please see discharge instructions. Patient expresses understanding.   ***
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
## 2022-09-02 ENCOUNTER — Ambulatory Visit: Payer: Medicare Other | Admitting: Behavioral Health

## 2022-09-02 DIAGNOSIS — F3181 Bipolar II disorder: Secondary | ICD-10-CM | POA: Diagnosis not present

## 2022-09-02 NOTE — Progress Notes (Signed)
                Victoria L Winstead, LMFT 

## 2022-09-03 ENCOUNTER — Other Ambulatory Visit: Payer: Self-pay

## 2022-09-03 ENCOUNTER — Encounter: Payer: Self-pay | Admitting: Physical Therapy

## 2022-09-03 ENCOUNTER — Ambulatory Visit: Payer: Medicare Other | Attending: Family Medicine | Admitting: Physical Therapy

## 2022-09-03 DIAGNOSIS — M6281 Muscle weakness (generalized): Secondary | ICD-10-CM | POA: Insufficient documentation

## 2022-09-03 DIAGNOSIS — R2689 Other abnormalities of gait and mobility: Secondary | ICD-10-CM | POA: Diagnosis not present

## 2022-09-03 DIAGNOSIS — R2681 Unsteadiness on feet: Secondary | ICD-10-CM | POA: Insufficient documentation

## 2022-09-03 DIAGNOSIS — R296 Repeated falls: Secondary | ICD-10-CM | POA: Diagnosis not present

## 2022-09-03 NOTE — Therapy (Signed)
OUTPATIENT PHYSICAL THERAPY NEURO EVALUATION   Patient Name: Shari Horn MRN: [id number] DOB:1940/05/05, 82 y.o., female Today's Date: 09/03/2022   PCP: Jacquiline Doe MD  REFERRING PROVIDER: Rodolph Bong, MD  END OF SESSION:  PT End of Session - 09/03/22 1548     Visit Number 1    Number of Visits 17    Date for PT Re-Evaluation 10/29/22    Authorization Type UHC MCR    Authorization Time Period 09/03/22 to 10/29/22    Progress Note Due on Visit 10    PT Start Time 1447    PT Stop Time 1534    PT Time Calculation (min) 47 min    Activity Tolerance Patient tolerated treatment well    Behavior During Therapy WFL for tasks assessed/performed             Past Medical History:  Diagnosis Date   Carotid artery disease (HCC)    Depression    Dyslipidemia    Hyperlipidemia    Hypothyroidism    Mood disorder (HCC)    BPD   PVC's (premature ventricular contractions)    Past Surgical History:  Procedure Laterality Date   BREAST BIOPSY     BREAST EXCISIONAL BIOPSY Left 1990   BREAST EXCISIONAL BIOPSY Left 1996   Carotid Doppler  12/2011   0-49% RICA stenosis, 50-69% LICA stenosis   TOTAL HIP ARTHROPLASTY Left 12/12/2017   Procedure: TOTAL HIP ARTHROPLASTY ANTERIOR APPROACH;  Surgeon: Samson Frederic, MD;  Location: [facility] OR;  Service: Orthopedics;  Laterality: Left;   TRANSTHORACIC ECHOCARDIOGRAM  12/2011   EF 60-65%, grade 1 diastolic dysfunction; trivial AV regurg; calcified MV annulus   Patient Active Problem List   Diagnosis Date Noted   Long-term current use of benzodiazepine 06/24/2022   Chronic back pain 10/22/2020   Forgetfulness 03/10/2017   Unsteady gait 01/25/2017   Gastroesophageal reflux disease without esophagitis 10/12/2016   Osteopenia 09/17/2016   Dysphagia 09/14/2016   Bipolar 2 disorder, major depressive episode (HCC) 09/14/2016   Hypothyroidism 09/14/2016   Bilateral carotid artery disease (HCC) 12/20/2012   PVC's (premature ventricular  contractions) 12/20/2012   Dyslipidemia 12/20/2012    ONSET DATE: 15 years   REFERRING DIAG: R26.89 (ICD-10-CM) - Balance disorder  THERAPY DIAG:  Unsteadiness on feet  Repeated falls  Muscle weakness (generalized)  Rationale for Evaluation and Treatment: Rehabilitation  SUBJECTIVE:                                                                                                                                                                                             SUBJECTIVE STATEMENT: I've been  having this issue for about 15 years, after about 5 years of that my PCP sent me to PT at [facility]. Recently got off of clonazpam which does have a relation to falling, off of it now but not sure if its made a difference. In the past I also had hip replacement surgery (2019), also had pelvic fracture in 2020 or 2021. Dr. Denyse Amass said that I should be using a walker for this but I don't feel ready for this.   Pt accompanied by: self  PERTINENT HISTORY: CAD, depression, HLD, hypothyroidism, mood disorder (BPD), PVCs, THA  PAIN:  Are you having pain? No 1-2/10 "I don't really have pain its more a problem falling"   PRECAUTIONS: Fall  RED FLAGS: None   WEIGHT BEARING RESTRICTIONS: No  FALLS: Has patient fallen in last 6 months? Yes. Number of falls 5-6 "minor falls that I could get up from"; some falls from rushing to get dressed, others from tripping over roots outside. FOF (+)  LIVING ENVIRONMENT: Lives with: lives alone Lives in: House/apartment Stairs: has stairs inside home but does not have to use them/lives on main floor; 3-4 STE home with B rails  Has following equipment at home: Single point cane, Walker - 2 wheeled, and "a mac truck version of a cane from [employer] (quad cane)  PLOF: Independent, Independent with basic ADLs, Independent with gait, and Independent with transfers  PATIENT GOALS: no more falls  OBJECTIVE:   COGNITION: Overall cognitive  status: Within functional limits for tasks assessed        LOWER EXTREMITY MMT:    MMT Right Eval Left Eval  Hip flexion 3+ 3+  Hip extension    Hip abduction 4 4  Hip adduction    Hip internal rotation    Hip external rotation    Knee flexion 3+ 3+  Knee extension 4- 4-  Ankle dorsiflexion 5 5  Ankle plantarflexion    Ankle inversion    Ankle eversion    (Blank rows = not tested)       FUNCTIONAL TESTS:  Berg Balance Scale: 31/56    TODAY'S TREATMENT:                                                                                                                              DATE:   Eval  Objective measures, care planning, appropriate education  TherEx  Seated LAQs with red TB x5 B + 3 second holds Standing hip ABD with red TB x5 B  Narrow BOS x30 seconds Semi-tandem stance x30 seconds B        PATIENT EDUCATION: Education details: exam findings, POC, HEP  Person educated: Patient Education method: Programmer, multimedia, Demonstration, and Handouts Education comprehension: verbalized understanding, returned demonstration, and needs further education  HOME EXERCISE PROGRAM: Access Code: [email or web address] URL: [email or web address] Date: 09/03/2022 Prepared by: Nedra Hai  Exercises - Sitting Knee Extension with Resistance  - 1-2 x daily -  1 sets - 10 reps - 3 seconds  hold - Standing Hip Abduction with Resistance at Thighs  - 1-2 x daily - 1 sets - 10 reps - 1 second hold - Standing Narrow Base of Support  - 1-2 x daily - 7 x weekly - 1 sets - 3 reps - 30 seconds  hold - Semi-Tandem Corner Balance With Eyes Open  - 1-2 x daily - 7 x weekly - 1 sets - 3 reps - 30 seconds  hold  GOALS: Goals reviewed with patient? Yes  SHORT TERM GOALS: Target date: 10/01/2022    Will be compliant with appropriate progressive HEP  Baseline: Goal status: INITIAL  2.  Will be able to maintain formal tandem stance for at least 10 seconds with no more than min  guard  Baseline:  Goal status: INITIAL  3.  Will be able to name at least 3 ways to prevent a fall at home and the community  Baseline:  Goal status: INITIAL    LONG TERM GOALS: Target date: 10/29/2022    MMT to improve by one grade in all weak groups  Baseline:  Goal status: INITIAL  2.  Will score at least 45 on the Berg to show reduced fall risk and improved overall balance  Baseline:  Goal status: INITIAL  3.  Will be able to get down to/up from the floor on a Mod(I) basis without difficulty to show good fall recovery skills  Baseline:  Goal status: INITIAL  4.  No falls within the past 4 weeks  Baseline:  Goal status: INITIAL    ASSESSMENT:  CLINICAL IMPRESSION: Patient is a 82 y.o. F who was seen today for physical therapy evaluation and treatment for balance disorder. It sounds like this has been relatively chronic with recent worsening, has had 5-6 falls within the past 6 months. She does have quite a bit of muscle weakness as well as severe balance impairments, really she is quite a high fall risk. Will benefit from skilled PT services to address all impairments and assist in reaching optimal level of function with minimal fall risk moving forward.    OBJECTIVE IMPAIRMENTS: decreased activity tolerance, decreased balance, decreased mobility, difficulty walking, decreased strength, and decreased safety awareness.   ACTIVITY LIMITATIONS: stairs and locomotion level  PARTICIPATION LIMITATIONS: driving, shopping, community activity, yard work, and church  PERSONAL FACTORS: Age, Behavior pattern, Education, Fitness, Past/current experiences, and Time since onset of injury/illness/exacerbation are also affecting patient's functional outcome.   REHAB POTENTIAL: Good  CLINICAL DECISION MAKING: Stable/uncomplicated  EVALUATION COMPLEXITY: Low  PLAN:  PT FREQUENCY: 2x/week  PT DURATION: 8 weeks  PLANNED INTERVENTIONS: Therapeutic exercises, Therapeutic activity,  Neuromuscular re-education, Balance training, Gait training, Patient/Family education, Self Care, Stair training, DME instructions, Aquatic Therapy, Cryotherapy, Moist heat, Taping, Ionotophoresis 4mg /ml Dexamethasone, Manual therapy, and Re-evaluation  PLAN FOR NEXT SESSION: focus on balance and functional strengthening, fall prevention    Nedra Hai, PT, DPT 09/03/22 3:50 PM

## 2022-09-08 ENCOUNTER — Ambulatory Visit: Payer: Medicare Other | Admitting: Physical Therapy

## 2022-09-09 ENCOUNTER — Ambulatory Visit (INDEPENDENT_AMBULATORY_CARE_PROVIDER_SITE_OTHER): Payer: Medicare Other | Admitting: Otolaryngology

## 2022-09-09 ENCOUNTER — Encounter (INDEPENDENT_AMBULATORY_CARE_PROVIDER_SITE_OTHER): Payer: Self-pay | Admitting: Otolaryngology

## 2022-09-09 VITALS — BP 142/84 | HR 79 | Ht 67.0 in | Wt 165.0 lb

## 2022-09-09 DIAGNOSIS — H9193 Unspecified hearing loss, bilateral: Secondary | ICD-10-CM

## 2022-09-09 DIAGNOSIS — H6123 Impacted cerumen, bilateral: Secondary | ICD-10-CM | POA: Diagnosis not present

## 2022-09-09 NOTE — Patient Instructions (Addendum)
Please follow up with audiology. We are happy to see you back after that test is complete. You can use sweet oil in your ears as needed for dryness.  Place cerumen impaction patient instructions here.

## 2022-09-09 NOTE — Progress Notes (Signed)
ENT CONSULT:  Reason for Consult: Hearing changes and concern for cerumen impaction  HPI: Shari Horn is an 82 y.o. female with hx GERD, hypothyroidism, balance problems/unsteady gait and frequent falls, currently doing outpatient rehab for that, here for evaluation of decreased hearing and concern for cerumen impaction.   Records Reviewed:  Note by Dr Denyse Amass Sports [facility] Shari Horn is a 82 y.o. female who presents to [facility] today for intermittent low back pain w/ unsteady gait and frequent falls. She was previously evaluated by neurology in 2021-22 and dx w/ mild cognitive impairment and lumbosacral radiculopathy. She started having frequent fallsa bout 10 years ago. Had hip replacement surgery 4 years ago. Falls have cont'd, most recently 2-3 wks ago. She generally uses her cane to ambulate, but does have a walker at home.  No pain or radiating pain today. No numbness/tingling. No weakness noted. PCP d/c clonazepam, in hopes that may resolved the frequent falls.    Past Medical History:  Diagnosis Date   Carotid artery disease (HCC)    Depression    Dyslipidemia    Hyperlipidemia    Hypothyroidism    Mood disorder (HCC)    BPD   PVC's (premature ventricular contractions)     Past Surgical History:  Procedure Laterality Date   BREAST BIOPSY     BREAST EXCISIONAL BIOPSY Left 1990   BREAST EXCISIONAL BIOPSY Left 1996   Carotid Doppler  12/2011   0-49% RICA stenosis, 50-69% LICA stenosis   TOTAL HIP ARTHROPLASTY Left 12/12/2017   Procedure: TOTAL HIP ARTHROPLASTY ANTERIOR APPROACH;  Surgeon: Samson Frederic, MD;  Location: [facility] OR;  Service: Orthopedics;  Laterality: Left;   TRANSTHORACIC ECHOCARDIOGRAM  12/2011   EF 60-65%, grade 1 diastolic dysfunction; trivial AV regurg; calcified MV annulus    Family History  Problem Relation Age of Onset   Heart failure Mother    Thyroid disease Mother    Hypertension Mother    Diabetes Mother     Arthritis/Rheumatoid Father    Heart Problems Maternal Grandmother    Heart disease Brother        valve replacement   Heart disease Child    Colon cancer Neg Hx    Esophageal cancer Neg Hx    Stomach cancer Neg Hx     Social History:  reports that she quit smoking about 49 years ago. Her smoking use included cigarettes. She started smoking about 59 years ago. She has been exposed to tobacco smoke. She has never used smokeless tobacco. She reports that she does not drink alcohol and does not use drugs.  Allergies:  Allergies  Allergen Reactions   Codeine Nausea And Vomiting and Nausea Only    Other reaction(s): Unknown    Medications: I have reviewed the patient's current medications.  The PMH, PSH, Medications, Allergies, and SH were reviewed and updated.  ROS: Constitutional: Negative for fever, weight loss and weight gain. Cardiovascular: Negative for chest pain and dyspnea on exertion. Respiratory: Is not experiencing shortness of breath at rest. Gastrointestinal: Negative for nausea and vomiting. Neurological: Negative for headaches. Psychiatric: The patient is not nervous/anxious  Blood pressure (!) 142/84, pulse 79, height 5\' 7"  (1.702 m), weight 165 lb (74.8 kg), SpO2 98%.  PHYSICAL EXAM:  Exam: General: Well-developed, well-nourished Respiratory Respiratory effort: Equal inspiration and expiration without stridor Cardiovascular Peripheral Vascular: Warm extremities with equal color/perfusion Eyes: No nystagmus with equal extraocular motion bilaterally Neuro/Psych/Balance: Patient oriented to person, place, and time;  Appropriate mood and affect; Gait is intact with no imbalance; Cranial nerves I-XII are intact Head and Face Inspection: Normocephalic and atraumatic without mass or lesion Palpation: Facial skeleton intact without bony stepoffs Salivary Glands: No mass or tenderness Facial Strength: Facial motility symmetric and full bilaterally ENT Pinna:  External ear intact and fully developed External canal: Canal is patent with intact skin Tympanic Membrane: Clear and mobile External Nose: No scar or anatomic deformity Internal Nose: Septum intact and midline. No edema, polyp, or rhinorrhea Lips, Teeth, and gums: Mucosa and teeth intact and viable TMJ: No pain to palpation with full mobility Oral cavity/oropharynx: No erythema or exudate, no lesions present Neck Neck and Trachea: Midline trachea without mass or lesion Thyroid: No mass or nodularity Lymphatics: No lymphadenopathy  Procedure: Procedure: Cerumen Removal, Bilateral (CPT [id number])  Diagnosis: cerumen impaction, bilateral   Informed consent: Timeout performed and informed consent was obtained.  Procedure: Operating microscope was employed to evaluate the ear(s).  Cerumen curette, speculum and suction were employed to clear the cerumen.   Findings: Normal appearing tympanic membrane on the left without perforations, and external canals are normal after removal of cerumen.No middle ear fluid bilaterally.   Complications: None. Patient tolerated well.   Studies Reviewed:CT head 07/21/2021 FINDINGS: Brain: No evidence of acute infarction, hemorrhage, hydrocephalus, extra-axial collection or mass lesion/mass effect. Low-attenuation of the periventricular white matter presumed chronic microvascular ischemic changes.   Vascular: No hyperdense vessel or unexpected calcification.   Skull: Normal. Negative for fracture or focal lesion.   Sinuses/Orbits: No acute finding.   Other: None.   IMPRESSION: 1.  No acute intracranial abnormality.   2. Mild cerebral volume loss and chronic microvascular ischemic changes of the white matter, unchanged.  Assessment/Plan: Encounter Diagnoses  Name Primary?   Decreased hearing of both ears Yes   Bilateral impacted cerumen    Bilateral hearing loss, unspecified hearing loss type     82 year old female with history of  unsteady gait and frequent falls currently followed by sports medicine and undergoing rehab for that, referred to [place] for decreased hearing and bilateral cerumen impaction.  Patient reports that hearing changes gradual and not sudden, denies tinnitus, denies vertigo.  We performed bilateral cerumen removal today which did not provide relief when comes to hearing changes.  She denies recent test.  Refer for audiogram, and she will return after testing.  She also reports itchy ears, and we advised to use Sweet Oil available over-the-counter to help with.   - Audio and RTC after testing   Thank you for allowing me to participate in the care of this patient. Please do not hesitate to contact me with any questions or concerns.   Ashok Croon, MD Otolaryngology [facility] Phone: [phone] Fax: [phone]    09/09/2022, 7:45 PM

## 2022-09-15 ENCOUNTER — Other Ambulatory Visit: Payer: Self-pay

## 2022-09-15 ENCOUNTER — Telehealth: Payer: Self-pay | Admitting: Family Medicine

## 2022-09-15 ENCOUNTER — Emergency Department (HOSPITAL_BASED_OUTPATIENT_CLINIC_OR_DEPARTMENT_OTHER): Payer: Medicare Other | Admitting: Radiology

## 2022-09-15 ENCOUNTER — Encounter (HOSPITAL_BASED_OUTPATIENT_CLINIC_OR_DEPARTMENT_OTHER): Payer: Self-pay

## 2022-09-15 ENCOUNTER — Emergency Department (HOSPITAL_BASED_OUTPATIENT_CLINIC_OR_DEPARTMENT_OTHER)
Admission: EM | Admit: 2022-09-15 | Discharge: 2022-09-15 | Disposition: A | Discharge status: Home / Self Care | Payer: Medicare Other | Attending: Emergency Medicine | Admitting: Emergency Medicine

## 2022-09-15 DIAGNOSIS — R1013 Epigastric pain: Secondary | ICD-10-CM | POA: Insufficient documentation

## 2022-09-15 DIAGNOSIS — R079 Chest pain, unspecified: Secondary | ICD-10-CM | POA: Insufficient documentation

## 2022-09-15 DIAGNOSIS — R0602 Shortness of breath: Secondary | ICD-10-CM | POA: Insufficient documentation

## 2022-09-15 DIAGNOSIS — R0789 Other chest pain: Secondary | ICD-10-CM | POA: Diagnosis not present

## 2022-09-15 DIAGNOSIS — R110 Nausea: Secondary | ICD-10-CM | POA: Insufficient documentation

## 2022-09-15 DIAGNOSIS — I700 Atherosclerosis of aorta: Secondary | ICD-10-CM | POA: Diagnosis not present

## 2022-09-15 DIAGNOSIS — R9389 Abnormal findings on diagnostic imaging of other specified body structures: Secondary | ICD-10-CM | POA: Diagnosis not present

## 2022-09-15 LAB — CBC
HCT: 38.1 % (ref 36.0–46.0)
Hemoglobin: 12.9 g/dL (ref 12.0–15.0)
MCH: 33.2 pg (ref 26.0–34.0)
MCHC: 33.9 g/dL (ref 30.0–36.0)
MCV: 97.9 fL (ref 80.0–100.0)
Platelets: 246 10*3/uL (ref 150–400)
RBC: 3.89 MIL/uL (ref 3.87–5.11)
RDW: 13.2 % (ref 11.5–15.5)
WBC: 7.8 10*3/uL (ref 4.0–10.5)
nRBC: 0 % (ref 0.0–0.2)

## 2022-09-15 LAB — BASIC METABOLIC PANEL
Anion gap: 10 (ref 5–15)
BUN: 17 mg/dL (ref 8–23)
CO2: 24 mmol/L (ref 22–32)
Calcium: 10.2 mg/dL (ref 8.9–10.3)
Chloride: 103 mmol/L (ref 98–111)
Creatinine, Ser: 1.02 mg/dL — ABNORMAL HIGH (ref 0.44–1.00)
GFR, Estimated: 55 mL/min — ABNORMAL LOW (ref >60)
Glucose, Bld: 105 mg/dL — ABNORMAL HIGH (ref 70–99)
Potassium: 4 mmol/L (ref 3.5–5.1)
Sodium: 137 mmol/L (ref 135–145)

## 2022-09-15 LAB — HEPATIC FUNCTION PANEL
ALT: 28 U/L (ref 0–44)
AST: 28 U/L (ref 15–41)
Albumin: 4.7 g/dL (ref 3.5–5.0)
Alkaline Phosphatase: 42 U/L (ref 38–126)
Bilirubin, Direct: 0.1 mg/dL (ref 0.0–0.2)
Indirect Bilirubin: 0.2 mg/dL — ABNORMAL LOW (ref 0.3–0.9)
Total Bilirubin: 0.3 mg/dL (ref 0.3–1.2)
Total Protein: 6.9 g/dL (ref 6.5–8.1)

## 2022-09-15 LAB — TROPONIN I (HIGH SENSITIVITY)
Troponin I (High Sensitivity): 3 ng/L (ref <18)
Troponin I (High Sensitivity): 3 ng/L (ref <18)

## 2022-09-15 LAB — LIPASE, BLOOD: Lipase: 34 U/L (ref 11–51)

## 2022-09-15 MED ORDER — LIDOCAINE VISCOUS HCL 2 % MT SOLN
15.0000 mL | Freq: Once | OROMUCOSAL | Status: AC
  Administered 2022-09-15: 15 mL via OROMUCOSAL
  Filled 2022-09-15: qty 15

## 2022-09-15 MED ORDER — ALUM & MAG HYDROXIDE-SIMETH 200-200-20 MG/5ML PO SUSP
30.0000 mL | Freq: Once | ORAL | Status: AC
  Administered 2022-09-15: 30 mL via ORAL
  Filled 2022-09-15: qty 30

## 2022-09-15 NOTE — ED Provider Notes (Signed)
[place] [facility]GENC[facility] DEPARTMENT AT [facility] Provider Note   CSN: [id number] Arrival date & time: 09/15/22  1052     History  Chief Complaint  Patient presents with   Chest Pain   Shortness of Breath    Shari Horn is a 82 y.o. female who presents with epigastric and chest pain. Worse when she lies down. Sometimes feels sob. + for nausea. Ongoing for several days. Denies orthopnea/ pnd. Pain and sob are nonexertional. Hx of gastric ulcer but only takes her ppi "as needed."   Chest Pain Associated symptoms: shortness of breath   Shortness of Breath Associated symptoms: chest pain        Home Medications Prior to Admission medications   Medication Sig Start Date End Date Taking? Authorizing Provider  acetaminophen (TYLENOL) 500 MG tablet Take 1 tablet (500 mg total) by mouth every 6 (six) hours as needed. 07/21/21   Derwood Kaplan, MD  azelastine (ASTELIN) 0.1 % nasal spray Place 2 sprays into both nostrils 2 (two) times daily. 08/27/20   Ardith Dark, MD  baclofen (LIORESAL) 10 MG tablet TAKE 1/2 TABLET(5 MG) BY MOUTH THREE TIMES DAILY 04/14/22   Ardith Dark, MD  buPROPion (WELLBUTRIN XL) 150 MG 24 hr tablet TAKE 1 TABLET(150 MG) BY MOUTH DAILY 07/06/22   Ardith Dark, MD  cholecalciferol (VITAMIN D3) 25 MCG (1000 UT) tablet Take 2,000 Units by mouth daily.    [provider]  ezetimibe (ZETIA) 10 MG tablet TAKE 1 TABLET BY MOUTH DAILY 10/03/21   Hilty, Lisette Abu, MD  FLUoxetine (PROZAC) 20 MG capsule TAKE 3 CAPSULES(60 MG) BY MOUTH DAILY 07/30/22   Ardith Dark, MD  lamoTRIgine (LAMICTAL) 25 MG tablet TAKE 1 TABLET(25 MG) BY MOUTH DAILY. 06/19/22   Ardith Dark, MD  levothyroxine (SYNTHROID) 75 MCG tablet TAKE 1 TABLET BY MOUTH DAILY 04/06/22   Ardith Dark, MD  meclizine (ANTIVERT) 25 MG tablet Take 1 tablet (25 mg total) by mouth 3 (three) times daily as needed for dizziness. Patient not taking: Reported on 09/09/2022 09/30/17   Orland Mustard, MD   Multiple Vitamin (MULTIVITAMIN WITH MINERALS) TABS tablet Take 1 tablet by mouth daily.    [provider]  pantoprazole (PROTONIX) 40 MG tablet Take 1 tablet (40 mg total) by mouth daily. 08/04/22   Ardith Dark, MD  polyethylene glycol (MIRALAX) 17 g packet Take 17 g by mouth daily as needed for mild constipation. 07/27/21   Maia Plan, MD  QUEtiapine (SEROQUEL) 300 MG tablet TAKE 1 TABLET(300 MG) BY MOUTH AT BEDTIME 08/04/22   Ardith Dark, MD  rosuvastatin (CRESTOR) 40 MG tablet TAKE 1 TABLET BY MOUTH DAILY 07/24/22   Hilty, Lisette Abu, MD  senna-docusate (SENOKOT-S) 8.6-50 MG tablet Take 1 tablet by mouth at bedtime as needed for mild constipation or moderate constipation. 07/27/21   Long, Arlyss Repress, MD      Allergies    Codeine    Review of Systems   Review of Systems  Respiratory:  Positive for shortness of breath.   Cardiovascular:  Positive for chest pain.    Physical Exam Updated Vital Signs BP 108/78 (BP Location: Right Arm)   Pulse 81   Temp (!) 97.4 F (36.3 C) (Temporal)   Resp 18   Ht 5\' 7"  (1.702 m)   Wt 74.8 kg   LMP  (LMP Unknown)   SpO2 95%   BMI 25.84 kg/m  Physical Exam Vitals and nursing  note reviewed.  Constitutional:      General: She is not in acute distress.    Appearance: She is well-developed. She is not diaphoretic.  HENT:     Head: Normocephalic and atraumatic.     Right Ear: External ear normal.     Left Ear: External ear normal.     Nose: Nose normal.     Mouth/Throat:     Mouth: Mucous membranes are moist.  Eyes:     General: No scleral icterus.    Conjunctiva/sclera: Conjunctivae normal.  Cardiovascular:     Rate and Rhythm: Normal rate and regular rhythm.     Heart sounds: Normal heart sounds. No murmur heard.    No friction rub. No gallop.  Pulmonary:     Effort: Pulmonary effort is normal. No respiratory distress.     Breath sounds: Normal breath sounds.  Abdominal:     General: Bowel sounds are normal. There is no  distension.     Palpations: Abdomen is soft. There is no mass.     Tenderness: There is abdominal tenderness in the epigastric area. There is no guarding. Negative signs include Murphy's sign.  Musculoskeletal:     Cervical back: Normal range of motion.  Skin:    General: Skin is warm and dry.  Neurological:     Mental Status: She is alert and oriented to person, place, and time.  Psychiatric:        Behavior: Behavior normal.     ED Results / Procedures / Treatments   Labs (all labs ordered are listed, but only abnormal results are displayed) Labs Reviewed  BASIC METABOLIC PANEL - Abnormal; Notable for the following components:      Result Value   Glucose, Bld 105 (*)    Creatinine, Ser 1.02 (*)    GFR, Estimated 55 (*)    All other components within normal limits  CBC  TROPONIN I (HIGH SENSITIVITY)  TROPONIN I (HIGH SENSITIVITY)    EKG None  Radiology DG Chest 2 View  Result Date: 09/15/2022 CLINICAL DATA:  Chest pain and shortness of breath for 2 weeks. EXAM: CHEST - 2 VIEW COMPARISON:  10/18/2018. FINDINGS: Bilateral lung fields are clear. Elevated right hemidiaphragm, similar to the prior study. Bilateral costophrenic angles are clear. Normal cardio-mediastinal silhouette. Aortic arch calcifications noted. No acute osseous abnormalities. The soft tissues are within normal limits. IMPRESSION: No acute cardiopulmonary process. Electronically Signed   By: Jules Schick M.D.   On: 09/15/2022 13:08    Procedures Procedures    Medications Ordered in ED Medications - No data to display  ED Course/ Medical Decision Making/ A&P Clinical Course as of 09/17/22 1447  Tue Sep 15, 2022  1344 Here with epigastic pain. Differential diagnosis of epigastric pain includes: Functional or nonulcer dyspepsia PUD, GERD, Gastritis, (NSAIDs, alcohol, stress, H. pylori, pernicious anemia), pancreatitis or pancreatic cancer, overeating indigestion (high-fat foods, coffee), drugs (aspirin,  antibiotics (eg, macrolides, metronidazole), corticosteroids, digoxin, narcotics, theophylline), gastroparesis, lactose intolerance, malabsorption gastric cancer, parasitic infection, (Giardia, Strongyloides, Ascaris) cholelithiasis, choledocholithiasis, or cholangitis, ACS, pericarditis, pneumonia, abdominal hernia, pregnancy, intestinal ischemia, esophageal rupture, gastric volvulus, hepatitis.   Suspect gastritis.  [AH]  1345 Basic metabolic panel(!) [AH]  1345 Creatinine(!): 1.02 [AH]  1345 GFR, Estimated(!): 55 Chronic renal insufficiency   [AH]  1345 DG Chest 2 View [AH]  1345 I personally visualized and interpreted the images using our PACS system. Acute findings include:  None  [AH]  1345 Troponin I (High Sensitivity) [AH]  1500 Troponin I (High Sensitivity) [AH]  1500 Lipase, blood [AH]    Clinical Course User Index [AH] Arthor Captain, PA-C                                 Medical Decision Making Amount and/or Complexity of Data Reviewed Labs: ordered. Decision-making details documented in ED Course. Radiology: ordered. Decision-making details documented in ED Course.  Risk OTC drugs. Prescription drug management.  Given the large differential diagnosis for Shari Horn, the decision making in this case is of high complexity.  After evaluating all of the data points in this case, the presentation of Shari Horn is NOT consistent with Acute Coronary Syndrome (ACS) and/or myocardial ischemia, pulmonary embolism, aortic dissection; Borhaave's, significant arrythmia, pneumothorax, cardiac tamponade, or other emergent cardiopulmonary condition.  Further, the presentation of Shari Horn is NOT consistent with pericarditis, myocarditis, cholecystitis, pancreatitis, mediastinitis, endocarditis, new valvular disease.  Additionally, the presentation of Shari Horn NOT consistent with flail chest, cardiac contusion, ARDS, or significant intra-thoracic or intra-abdominal  bleeding.  Moreover, this presentation is NOT consistent with pneumonia, sepsis, or pyelonephritis.  The patient has suspected pain from dyspepsia.   Strict return and follow-up precautions have been given by me personally or by detailed written instruction given verbally by nursing staff using the teach back method to the patient/family/caregiver(s).  Data Reviewed/Counseling: I have reviewed the patient's vital signs, nursing notes, and other relevant tests/information. I had a detailed discussion regarding the historical points, exam findings, and any diagnostic results supporting the discharge diagnosis. I also discussed the need for outpatient follow-up and the need to return to the ED if symptoms worsen or if there are any questions or concerns that arise at home.         Final Clinical Impression(s) / ED Diagnoses Final diagnoses:  Epigastric pain    Rx / DC Orders ED Discharge Orders     None         Arthor Captain, PA-C 09/17/22 1448    Tanda Rockers A, DO 09/18/22 1606

## 2022-09-15 NOTE — ED Triage Notes (Signed)
Pt to ED c/o chest pain and SHOB x 2 weeks, reports no aggravating or alleviating factors. No s/s of acute distress noted in triage.

## 2022-09-15 NOTE — Telephone Encounter (Signed)
Final Outcome: Go to ED Now. Per chart, pt is currently at the ED.    Patient Name First: Shari Last: Horn Gender: Female DOB: 20-Dec-1940 Age: 82 Y 9 M 21 D Return Phone Number: [phone] (Primary) Address: City/ State/ Zip: [place]  [address] Client [facility], [facility] Provider Jacquiline Doe- MD Contact Type Call Who Is Calling Patient / Member / Family / Caregiver Call Type Triage / Clinical Relationship To Patient Self Return Phone Number [phone] (Primary) Chief Complaint CHEST PAIN - pain, pressure, heaviness or tightness Reason for Call Symptomatic / Request for Health Information Initial Comment Caller states having check pain and SOB with some back pain; 2 wks sporadically; getting worse; Translation No Nurse Assessment Nurse: Charna Elizabeth, RN, Lynden Ang Date/Time (Eastern Time): 09/15/2022 10:15:54 AM Confirm and document reason for call. If symptomatic, describe symptoms. ---Shari Horn states she has had chest and back pain with shortness of breath on and off the past 2 weeks. No chest or back pain at this time. She is having some shortness of breath at this time. Alert and responsive. Does the patient have any new or worsening symptoms? ---Yes Will a triage be completed? ---Yes Related visit to physician within the last 2 weeks? ---No Does the PT have any chronic conditions? (i.e. diabetes, asthma, this includes High risk factors for pregnancy, etc.) ---Yes List chronic conditions. ---Bipolar, High Cholesterol, Depression Is this a behavioral health or substance abuse call? ---No Guidelines Guideline Title Affirmed Question Affirmed Notes Nurse Date/Time Lamount Cohen Time) Chest Pain Difficulty breathing [place], RN, Lynden Ang 09/15/2022 10:19:06 AM Disp. Time Lamount Cohen Time) Disposition Final User 09/15/2022 10:13:15 AM Send to Urgent Queue Albin Fischer Disp. Time  Lamount Cohen Time) Disposition Final User 09/15/2022 10:23:28 AM Go to ED Now Yes Charna Elizabeth, RN, Lynden Ang Final Disposition 09/15/2022 10:23:28 AM Go to ED Now Yes Charna Elizabeth, RN, Frann Rider Disagree/Comply Comply Caller Understands Yes PreDisposition Call Doctor Care Advice Given Per Guideline GO TO ED NOW: * You need to be seen in the Emergency Department. * Go to the ED at ___________ Hospital. * Leave now. Drive carefully. NOTHING BY MOUTH: * Do not eat or drink anything for now. CALL EMS IF: * Severe difficulty breathing occurs * Passes out or becomes too weak to stand * You become worse CARE ADVICE given per Chest Pain (Adult) guideline. BRING MEDICINES: * Bring a list of your current medicines when you go to the Emergency Department (ER). * Bring the pill bottles too. This will help the doctor (or NP/PA) to make certain you are taking the right medicines and the right dose. NOTE TO TRIAGER - DRIVING: * Another adult should drive. * Patient should not delay going to the emergency department. * If immediate transportation is not available via car, rideshare (e.g., Lyft, Uber), or taxi, then the patient should be instructed to call [id number].

## 2022-09-15 NOTE — Telephone Encounter (Signed)
FYI: This call has been transferred to triage nurse: the Triage Nurse. Once the result note has been entered staff can address the message at that time.  Patient called in with the following symptoms:  Red Word:chest pain, back pain and shortness of breath - 2 weeks sporadically and is no wgetting worse.   Please advise at Mobile [phone] (mobile)  Message is routed to Provider Pool.

## 2022-09-15 NOTE — Discharge Instructions (Addendum)
You do not appear to be having any serious causes of chest pain today . I suspect your symptoms are related to your ulcer  Make sure to take your acid reducer daily. Follow up wiith you pcp or a GI speciatlist. .Benay Pillow a health care provider if: Your symptoms get worse. Your abdominal pain gets worse. Your symptoms return after treatment. You have a fever. Get help right away if: You vomit blood or a substance that looks like coffee grounds. You have black or dark red stools. You are unable to keep fluids down.

## 2022-09-16 ENCOUNTER — Ambulatory Visit: Payer: Medicare Other | Admitting: Physical Therapy

## 2022-09-16 NOTE — Telephone Encounter (Signed)
Patient was at ED on 09/15/2022

## 2022-09-17 NOTE — Progress Notes (Signed)
[facility] Counselor/Therapist Progress Note  Patient ID: Shari Horn, MRN: [id number],    Date: 09/17/2022  Time Spent: 55 min In Person @ [facility] Time In: 1:00pm Time Out:  1:55pm  Treatment Type: Individual Therapy  Reported Symptoms: Decrease in anx/dep & stress since initiating psychotherapy & processing her upbringing, childhood, Family dynamics & childhood home.   Mental Status Exam: Appearance:  Casual     Behavior: Appropriate and Sharing  Motor: Normal  Speech/Language:  Clear and Coherent  Affect: Appropriate  Mood: normal  Thought process: normal  Thought content:   WNL  Sensory/Perceptual disturbances:   WNL  Orientation: oriented to person, place, time/date, and situation  Attention: Good  Concentration: Good  Memory: WNL  Fund of knowledge:  Good  Insight:   Good  Judgment:  Good  Impulse Control: Good   Risk Assessment: Danger to Self:  No Self-injurious Behavior: No Danger to Others: No Duty to Warn:no Physical Aggression / Violence:No  Access to Firearms a concern: No  Gang Involvement:No   Subjective: Shari Horn is upbeat today & benefiting from her time in psychotherapy. She is narrating her story growing up in her Family & making connections to her life as an Adult. She has realized her Family shaped how she made decisions re: her choice of marital partners & how those relationships have changed her. Shari Horn is grateful for her Hx.   Interventions: Narrative  Diagnosis:Bipolar 2 disorder, major depressive episode (HCC)  Plan: Shari Horn wants to complete her life review in psychotherapy as she finds this helpful to her current state of mind & mental health. She will cont to use her Notebook to record her thoughts for psychotherapy.  Target Date: 10/04/2022  Progress: 7  Frequency: Once monthly  Modality: Claretta Fraise, LMFT

## 2022-09-18 ENCOUNTER — Ambulatory Visit: Payer: Medicare Other | Admitting: Physical Therapy

## 2022-09-22 ENCOUNTER — Ambulatory Visit: Payer: Medicare Other | Admitting: Family Medicine

## 2022-09-22 VITALS — BP 142/82 | HR 91 | Ht 67.0 in | Wt 174.0 lb

## 2022-09-22 DIAGNOSIS — R2689 Other abnormalities of gait and mobility: Secondary | ICD-10-CM

## 2022-09-22 NOTE — Progress Notes (Signed)
   Rubin Payor, PhD, LAT, ATC acting as a scribe for Clementeen Graham, MD.  Shari Horn is a 82 y.o. female who presents to [facility] at [facility] today for f/u balance issues. Pt was last seen by Dr. Denyse Amass on 08/12/22 and she was advised to use a walker and was referred to neuro-rehab, only completing the initial evaluation (canceling/no-showing other scheduled visits).  Today, pt reports she wants to talk about her falls. She is having a problem committing to PT due her bipolar disorder, her daughter having to quit her job, and her son was in a terrible MVA. No falls since her last visit, but does note feeling off-balance. She is feeling depressed and is not using a walker to ambulate.   Dx testing: 07/21/21 Head CT             09/19/19 LE NCV study             10/18/18 L-spine & L hip XR  Pertinent review of systems: No fever or chills  Relevant historical information: Complicated social needs.  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   Exam:  BP (!) 142/82   Pulse 91   Ht 5\' 7"  (1.702 m)   Wt 174 lb (78.9 kg)   LMP  (LMP Unknown)   SpO2 99%   BMI 27.25 kg/m  General: Well Developed, well nourished, and in no acute distress.   Neuro: Lower extremity strength is intact.  Using a cane to ambulate.       Assessment and Plan: 83 y.o. female with impaired balance with occasional falls.  She does have an abnormal nerve conduction study lower extremities from 2021 that indicates that L3-4 could be a problem.  I do think that physical therapy especially vestibular PT could be very helpful.  She is already arranged for quite a lot over the next month and a half.  I advised that if twice weekly physical therapy is going to be too challenging for her than once weekly would certainly be better than none.  Recommend continuing PT and checking back in about 6 weeks.  If not improved consider more advanced imaging such as lumbar spine  MRI.   PDMP not reviewed this encounter. No orders of the defined types were placed in this encounter.  No orders of the defined types were placed in this encounter.    Discussed warning signs or symptoms. Please see discharge instructions. Patient expresses understanding.   The above documentation has been reviewed and is accurate and complete Clementeen Graham, M.D.

## 2022-09-22 NOTE — Patient Instructions (Addendum)
Thank you for coming in today.   Plan for PT.  If you cannot go 2x a week 1x a week is much better than nothing.   Recheck after you finish PT.  Let me know if you have a problem.   Cancel my upcomming visit and reschedule for mid October.   I am happy to talk to family members if they are worried.

## 2022-09-23 ENCOUNTER — Encounter: Payer: Self-pay | Admitting: Physical Therapy

## 2022-09-23 ENCOUNTER — Ambulatory Visit: Payer: Medicare Other | Attending: Family Medicine | Admitting: Physical Therapy

## 2022-09-23 DIAGNOSIS — H903 Sensorineural hearing loss, bilateral: Secondary | ICD-10-CM | POA: Insufficient documentation

## 2022-09-23 DIAGNOSIS — R296 Repeated falls: Secondary | ICD-10-CM | POA: Insufficient documentation

## 2022-09-23 DIAGNOSIS — R2681 Unsteadiness on feet: Secondary | ICD-10-CM | POA: Insufficient documentation

## 2022-09-23 DIAGNOSIS — M6281 Muscle weakness (generalized): Secondary | ICD-10-CM | POA: Insufficient documentation

## 2022-09-23 NOTE — Therapy (Signed)
OUTPATIENT PHYSICAL THERAPY NEURO TREATMENT   Patient Name: Shari Horn MRN: [id number] DOB:Mar 02, 1940, 82 y.o., female Today's Date: 09/24/2022   PCP: Jacquiline Doe MD  REFERRING PROVIDER: Rodolph Bong, MD  END OF SESSION:  PT End of Session - 09/23/22 1404     Visit Number 2    Number of Visits 17    Date for PT Re-Evaluation 10/29/22    Authorization Type UHC MCR    Authorization Time Period 09/03/22 to 10/29/22    Progress Note Due on Visit 10    PT Start Time 1405    PT Stop Time 1446    PT Time Calculation (min) 41 min    Activity Tolerance Patient tolerated treatment well    Behavior During Therapy WFL for tasks assessed/performed              Past Medical History:  Diagnosis Date   Carotid artery disease (HCC)    Depression    Dyslipidemia    Hyperlipidemia    Hypothyroidism    Mood disorder (HCC)    BPD   PVC's (premature ventricular contractions)    Past Surgical History:  Procedure Laterality Date   BREAST BIOPSY     BREAST EXCISIONAL BIOPSY Left 1990   BREAST EXCISIONAL BIOPSY Left 1996   Carotid Doppler  12/2011   0-49% RICA stenosis, 50-69% LICA stenosis   TOTAL HIP ARTHROPLASTY Left 12/12/2017   Procedure: TOTAL HIP ARTHROPLASTY ANTERIOR APPROACH;  Surgeon: Samson Frederic, MD;  Location: [facility] OR;  Service: Orthopedics;  Laterality: Left;   TRANSTHORACIC ECHOCARDIOGRAM  12/2011   EF 60-65%, grade 1 diastolic dysfunction; trivial AV regurg; calcified MV annulus   Patient Active Problem List   Diagnosis Date Noted   Long-term current use of benzodiazepine 06/24/2022   Chronic back pain 10/22/2020   Forgetfulness 03/10/2017   Unsteady gait 01/25/2017   Gastroesophageal reflux disease without esophagitis 10/12/2016   Osteopenia 09/17/2016   Dysphagia 09/14/2016   Bipolar 2 disorder, major depressive episode (HCC) 09/14/2016   Hypothyroidism 09/14/2016   Bilateral carotid artery disease (HCC) 12/20/2012   PVC's (premature ventricular  contractions) 12/20/2012   Dyslipidemia 12/20/2012    ONSET DATE: 15 years   REFERRING DIAG: R26.89 (ICD-10-CM) - Balance disorder  THERAPY DIAG:  Unsteadiness on feet  Muscle weakness (generalized)  Rationale for Evaluation and Treatment: Rehabilitation  SUBJECTIVE:                                                                                                                                                                                             SUBJECTIVE STATEMENT: Balance has just been  bad.  No new falls.  Used to be able to walk up to the mailbox, now not able to due to fear of falling.  Pt accompanied by: self  PERTINENT HISTORY: CAD, depression, HLD, hypothyroidism, mood disorder (BPD), PVCs, THA  PAIN:  Are you having pain? No 1-2/10 "I don't really have pain its more a problem falling"   PRECAUTIONS: Fall  RED FLAGS: None   WEIGHT BEARING RESTRICTIONS: No  FALLS: Has patient fallen in last 6 months? Yes. Number of falls 5-6 "minor falls that I could get up from"; some falls from rushing to get dressed, others from tripping over roots outside. FOF (+)  LIVING ENVIRONMENT: Lives with: lives alone Lives in: House/apartment Stairs: has stairs inside home but does not have to use them/lives on main floor; 3-4 STE home with B rails  Has following equipment at home: Single point cane, Walker - 2 wheeled, and "a mac truck version of a cane from [employer] (quad cane)  PLOF: Independent, Independent with basic ADLs, Independent with gait, and Independent with transfers  PATIENT GOALS: no more falls  OBJECTIVE:    TODAY'S TREATMENT: 09/23/2022 Activity Comments  FTSTS:  20.35 sec Indicates increased fall risk  TUG:  15.62 sec Indicates increased fall risk     Reviewed HEP, with pt return demo understanding   Seated march, 2 x 10, 2#   Standing march, 10 reps, 2#   Forward/back walking, 2# weight, 3 reps in parallel bars 1 UE support  Heel/toe raises 10  reps   Stagger stance forward/back weightshifting 10 reps BUE support       M-CTSIB  Condition 1: Firm Surface, EO 30 Sec, Mild Sway  Condition 2: Firm Surface, EC 30 Sec, Mild Sway  Condition 3: Foam Surface, EO 30 Sec, Moderate Sway  Condition 4: Foam Surface, EC 3 Sec, Severe Sway    Access Code: [email or web address] URL: [email or web address] Date: 09/23/2022 Prepared by: [facility] - Outpatient  Rehab - [facility]  Exercises - Sitting Knee Extension with Resistance  - 1-2 x daily - 1 sets - 10 reps - 3 seconds  hold - Standing Hip Abduction with Resistance at Thighs  - 1-2 x daily - 1 sets - 10 reps - 1 second hold - Standing Narrow Base of Support  - 1-2 x daily - 7 x weekly - 1 sets - 3 reps - 30 seconds  hold - Semi-Tandem Corner Balance With Eyes Open  - 1-2 x daily - 7 x weekly - 1 sets - 3 reps - 30 seconds  hold - Seated Hip Flexion March with Ankle Weights  - 1 x daily - 7 x weekly - 2 sets - 10 reps  PATIENT EDUCATION: Education details: HEP additions, encouraged pt to order ankle weights for home Person educated: Patient Education method: Explanation, Demonstration, and Handouts Education comprehension: verbalized understanding, returned demonstration, and needs further education   -------------------------------------------------------- Objective measures below taken at initial evaluation: COGNITION: Overall cognitive status: Within functional limits for tasks assessed        LOWER EXTREMITY MMT:    MMT Right Eval Left Eval  Hip flexion 3+ 3+  Hip extension    Hip abduction 4 4  Hip adduction    Hip internal rotation    Hip external rotation    Knee flexion 3+ 3+  Knee extension 4- 4-  Ankle dorsiflexion 5 5  Ankle plantarflexion    Ankle inversion    Ankle eversion    (Blank  rows = not tested)       FUNCTIONAL TESTS:  Berg Balance Scale: 31/56    TODAY'S TREATMENT:                                                                                                                               DATE:   Eval  Objective measures, care planning, appropriate education  TherEx  Seated LAQs with red TB x5 B + 3 second holds Standing hip ABD with red TB x5 B  Narrow BOS x30 seconds Semi-tandem stance x30 seconds B        PATIENT EDUCATION: Education details: exam findings, POC, HEP  Person educated: Patient Education method: Programmer, multimedia, Demonstration, and Handouts Education comprehension: verbalized understanding, returned demonstration, and needs further education  HOME EXERCISE PROGRAM: Access Code: [email or web address] URL: [email or web address] Date: 09/03/2022 Prepared by: Nedra Hai  Exercises - Sitting Knee Extension with Resistance  - 1-2 x daily - 1 sets - 10 reps - 3 seconds  hold - Standing Hip Abduction with Resistance at Thighs  - 1-2 x daily - 1 sets - 10 reps - 1 second hold - Standing Narrow Base of Support  - 1-2 x daily - 7 x weekly - 1 sets - 3 reps - 30 seconds  hold - Semi-Tandem Corner Balance With Eyes Open  - 1-2 x daily - 7 x weekly - 1 sets - 3 reps - 30 seconds  hold  GOALS: Goals reviewed with patient? Yes  SHORT TERM GOALS: Target date: 10/01/2022    Will be compliant with appropriate progressive HEP  Baseline: Goal status: IN PROGRESS  2.  Will be able to maintain formal tandem stance for at least 10 seconds with no more than min guard  Baseline:  Goal status: IN PROGRESS  3.  Will be able to name at least 3 ways to prevent a fall at home and the community  Baseline:  Goal status: IN PROGRESS    LONG TERM GOALS: Target date: 10/29/2022    MMT to improve by one grade in all weak groups  Baseline:  Goal status: IN PROGRESS  2.  Will score at least 45 on the Berg to show reduced fall risk and improved overall balance  Baseline:  Goal status: IN PROGRESS  3.  Will be able to get down to/up from the floor on a Mod(I) basis without difficulty to show good  fall recovery skills  Baseline:  Goal status: IN PROGRESS  4.  No falls within the past 4 weeks  Baseline:  Goal status: IN PROGRESS    ASSESSMENT:  CLINICAL IMPRESSION: Pt has not been able to make appointments since PT evaluation.  She returns today for first visit; assessed several balance tests and in addition to [facility] test score of 31/56, pt's FTSTS and TUG scores indicate increased fall risk.  Reviewed HEP and added seated hip flexion for strengthening, as hip muscular was weak during MMT at  eval.  She reports that she will likely only be able to attend 1x/wk due to family appointments/health issues; educated that she will need to make sure to consistently complete HEP, and expect updates for strength and balance.  OBJECTIVE IMPAIRMENTS: decreased activity tolerance, decreased balance, decreased mobility, difficulty walking, decreased strength, and decreased safety awareness.   ACTIVITY LIMITATIONS: stairs and locomotion level  PARTICIPATION LIMITATIONS: driving, shopping, community activity, yard work, and church  PERSONAL FACTORS: Age, Behavior pattern, Education, Fitness, Past/current experiences, and Time since onset of injury/illness/exacerbation are also affecting patient's functional outcome.   REHAB POTENTIAL: Good  CLINICAL DECISION MAKING: Stable/uncomplicated  EVALUATION COMPLEXITY: Low  PLAN:  PT FREQUENCY: 2x/week  PT DURATION: 8 weeks  PLANNED INTERVENTIONS: Therapeutic exercises, Therapeutic activity, Neuromuscular re-education, Balance training, Gait training, Patient/Family education, Self Care, Stair training, DME instructions, Aquatic Therapy, Cryotherapy, Moist heat, Taping, Ionotophoresis 4mg /ml Dexamethasone, Manual therapy, and Re-evaluation  PLAN FOR NEXT SESSION: focus on balance and functional strengthening, fall prevention    Lonia Blood, PT 09/24/22 9:48 AM Phone: [phone] Fax: [phone]  [facility] at  [facility] [address], [address] [place], [place] [address] Phone # [phone] Fax # [phone]

## 2022-09-29 ENCOUNTER — Telehealth: Payer: Self-pay

## 2022-09-29 NOTE — Therapy (Signed)
OUTPATIENT PHYSICAL THERAPY NEURO TREATMENT   Patient Name: Shari Horn MRN: [id number] DOB:[birth year], 82 y.o., female Today's Date: 09/30/2022   PCP: Jacquiline Doe MD  REFERRING PROVIDER: Rodolph Bong, MD  END OF SESSION:  PT End of Session - 09/30/22 1443     Visit Number 3    Number of Visits 17    Date for PT Re-Evaluation 10/29/22    Authorization Type UHC MCR    Authorization Time Period 09/03/22 to 10/29/22    Progress Note Due on Visit 10    PT Start Time 1404    PT Stop Time 1444    PT Time Calculation (min) 40 min    Equipment Utilized During Treatment Gait belt    Activity Tolerance Patient tolerated treatment well    Behavior During Therapy WFL for tasks assessed/performed               Past Medical History:  Diagnosis Date   Carotid artery disease (HCC)    Depression    Dyslipidemia    Hyperlipidemia    Hypothyroidism    Mood disorder (HCC)    BPD   PVC's (premature ventricular contractions)    Past Surgical History:  Procedure Laterality Date   BREAST BIOPSY     BREAST EXCISIONAL BIOPSY Left 1990   BREAST EXCISIONAL BIOPSY Left 1996   Carotid Doppler  12/2011   0-49% RICA stenosis, 50-69% LICA stenosis   TOTAL HIP ARTHROPLASTY Left 12/12/2017   Procedure: TOTAL HIP ARTHROPLASTY ANTERIOR APPROACH;  Surgeon: Samson Frederic, MD;  Location: [facility] OR;  Service: Orthopedics;  Laterality: Left;   TRANSTHORACIC ECHOCARDIOGRAM  12/2011   EF 60-65%, grade 1 diastolic dysfunction; trivial AV regurg; calcified MV annulus   Patient Active Problem List   Diagnosis Date Noted   Long-term current use of benzodiazepine 06/24/2022   Chronic back pain 10/22/2020   Forgetfulness 03/10/2017   Unsteady gait 01/25/2017   Gastroesophageal reflux disease without esophagitis 10/12/2016   Osteopenia 09/17/2016   Dysphagia 09/14/2016   Bipolar 2 disorder, major depressive episode (HCC) 09/14/2016   Hypothyroidism 09/14/2016   Bilateral carotid artery disease  (HCC) 12/20/2012   PVC's (premature ventricular contractions) 12/20/2012   Dyslipidemia 12/20/2012    ONSET DATE: 15 years   REFERRING DIAG: R26.89 (ICD-10-CM) - Balance disorder  THERAPY DIAG:  Unsteadiness on feet  Muscle weakness (generalized)  Repeated falls  Rationale for Evaluation and Treatment: Rehabilitation  SUBJECTIVE:  SUBJECTIVE STATEMENT: Reports that she is having a hard time accepting the fact that she can no longer run or walk for exercise. Admits to limited compliance with HEP.   Pt accompanied by: self  PERTINENT HISTORY: CAD, depression, HLD, hypothyroidism, mood disorder (BPD), PVCs, THA  PAIN:  Are you having pain? Yes: NPRS scale: 2-3/10 Pain location: LB Pain description: dull, "there" Aggravating factors: moving around Relieving factors: n/a    PRECAUTIONS: Fall  RED FLAGS: None   WEIGHT BEARING RESTRICTIONS: No  FALLS: Has patient fallen in last 6 months? Yes. Number of falls 5-6 "minor falls that I could get up from"; some falls from rushing to get dressed, others from tripping over roots outside. FOF (+)  LIVING ENVIRONMENT: Lives with: lives alone Lives in: House/apartment Stairs: has stairs inside home but does not have to use them/lives on main floor; 3-4 STE home with B rails  Has following equipment at home: Single point cane, Walker - 2 wheeled, and "a mac truck version of a cane from [employer] (quad cane)  PLOF: Independent, Independent with basic ADLs, Independent with gait, and Independent with transfers  PATIENT GOALS: no more falls  OBJECTIVE:     TODAY'S TREATMENT: 09/30/22 Activity Comments  Edu on nustep settings to improve comfort using it at [facility]  L5-6 x 6 min UEs/LEs    staggered ant/pos wt shift Weaning UE support to 1 UE;  cueing for coordinating foot movement   standing on foam ant/pos wt shift Pt very hesitant; limited amplitude   sidestepping onto/off foam  Pt very hesitant; B UE support and required cues to slide hands along counter to maintain uproght posture throughout   alt toe tap on cone Unable to wean 1 UE support d/t fearfulness. Tried with cane instead of counter   fwd/back stepping 10x each 1 UE support, cues for longer step back     PATIENT EDUCATION: Education details: edu on benefits of attending [employer] with her daughter and using nustep or recumbent bike to safely work on fitness ; edu on increasing consistency with HEP to improve balance and balance confidence; provided pt with address for [employer] as she asked for massage clinic in the area Person educated: Patient Education method: Explanation Education comprehension: verbalized understanding    Access Code: [email or web address] URL: [email or web address] Date: 09/23/2022 Prepared by: [facility] - Outpatient  Rehab - [facility]  Exercises - Sitting Knee Extension with Resistance  - 1-2 x daily - 1 sets - 10 reps - 3 seconds  hold - Standing Hip Abduction with Resistance at Thighs  - 1-2 x daily - 1 sets - 10 reps - 1 second hold - Standing Narrow Base of Support  - 1-2 x daily - 7 x weekly - 1 sets - 3 reps - 30 seconds  hold - Semi-Tandem Corner Balance With Eyes Open  - 1-2 x daily - 7 x weekly - 1 sets - 3 reps - 30 seconds  hold - Seated Hip Flexion March with Ankle Weights  - 1 x daily - 7 x weekly - 2 sets - 10 reps   -------------------------------------------------------- Objective measures below taken at initial evaluation: COGNITION: Overall cognitive status: Within functional limits for tasks assessed        LOWER EXTREMITY MMT:    MMT Right Eval Left Eval  Hip flexion 3+ 3+  Hip extension    Hip abduction 4 4  Hip adduction    Hip internal rotation    Hip external  rotation    Knee flexion 3+ 3+   Knee extension 4- 4-  Ankle dorsiflexion 5 5  Ankle plantarflexion    Ankle inversion    Ankle eversion    (Blank rows = not tested)       FUNCTIONAL TESTS:  Berg Balance Scale: 31/56    TODAY'S TREATMENT:                                                                                                                              DATE:   Eval  Objective measures, care planning, appropriate education  TherEx  Seated LAQs with red TB x5 B + 3 second holds Standing hip ABD with red TB x5 B  Narrow BOS x30 seconds Semi-tandem stance x30 seconds B        PATIENT EDUCATION: Education details: exam findings, POC, HEP  Person educated: Patient Education method: Programmer, multimedia, Demonstration, and Handouts Education comprehension: verbalized understanding, returned demonstration, and needs further education  HOME EXERCISE PROGRAM: Access Code: [email or web address] URL: [email or web address] Date: 09/03/2022 Prepared by: Nedra Hai  Exercises - Sitting Knee Extension with Resistance  - 1-2 x daily - 1 sets - 10 reps - 3 seconds  hold - Standing Hip Abduction with Resistance at Thighs  - 1-2 x daily - 1 sets - 10 reps - 1 second hold - Standing Narrow Base of Support  - 1-2 x daily - 7 x weekly - 1 sets - 3 reps - 30 seconds  hold - Semi-Tandem Corner Balance With Eyes Open  - 1-2 x daily - 7 x weekly - 1 sets - 3 reps - 30 seconds  hold  GOALS: Goals reviewed with patient? Yes  SHORT TERM GOALS: Target date: 10/01/2022    Will be compliant with appropriate progressive HEP  Baseline: Goal status: IN PROGRESS  2.  Will be able to maintain formal tandem stance for at least 10 seconds with no more than min guard  Baseline:  Goal status: IN PROGRESS  3.  Will be able to name at least 3 ways to prevent a fall at home and the community  Baseline:  Goal status: IN PROGRESS    LONG TERM GOALS: Target date: 10/29/2022    MMT to improve by one grade in all weak  groups  Baseline:  Goal status: IN PROGRESS  2.  Will score at least 45 on the Berg to show reduced fall risk and improved overall balance  Baseline:  Goal status: IN PROGRESS  3.  Will be able to get down to/up from the floor on a Mod(I) basis without difficulty to show good fall recovery skills  Baseline:  Goal status: IN PROGRESS  4.  No falls within the past 4 weeks  Baseline:  Goal status: IN PROGRESS    ASSESSMENT:  CLINICAL IMPRESSION: Patient arrived to session with report of feeling down about not being able to do the things she used to for exercise. Educated on  Nustep adjustment and use as she reports that she is already driving her daughter to the [facility]. Proceeded with balance training activities with CGA throughout d/t fearfulness of falling. Patient required at least 1 UE support with all activities today. Spoke about increasing compliance with HEP in order to improve on this. Patient reported understanding and without complaints upon leaving.   OBJECTIVE IMPAIRMENTS: decreased activity tolerance, decreased balance, decreased mobility, difficulty walking, decreased strength, and decreased safety awareness.   ACTIVITY LIMITATIONS: stairs and locomotion level  PARTICIPATION LIMITATIONS: driving, shopping, community activity, yard work, and church  PERSONAL FACTORS: Age, Behavior pattern, Education, Fitness, Past/current experiences, and Time since onset of injury/illness/exacerbation are also affecting patient's functional outcome.   REHAB POTENTIAL: Good  CLINICAL DECISION MAKING: Stable/uncomplicated  EVALUATION COMPLEXITY: Low  PLAN:  PT FREQUENCY: 2x/week  PT DURATION: 8 weeks  PLANNED INTERVENTIONS: Therapeutic exercises, Therapeutic activity, Neuromuscular re-education, Balance training, Gait training, Patient/Family education, Self Care, Stair training, DME instructions, Aquatic Therapy, Cryotherapy, Moist heat, Taping, Ionotophoresis 4mg /ml Dexamethasone,  Manual therapy, and Re-evaluation  PLAN FOR NEXT SESSION: focus on balance and functional strengthening, fall prevention     Anette Guarneri, PT, DPT 09/30/22 2:47 PM  [facility] at [facility] [address], [address] [place], [place] [address] Phone # [phone] Fax # [phone]

## 2022-09-29 NOTE — Telephone Encounter (Signed)
Transition Care Management Follow-up Telephone Call Date of discharge and from where: 09/15/2022 [facility] How have you been since you were released from the hospital? Patient stated she is feeling much better Any questions or concerns? No  Items Reviewed: Did the pt receive and understand the discharge instructions provided? Yes  Medications obtained and verified?  No medication prescribed. Other?  Patient requested contact information for Meals on Wheels for later. She was not interested in a referral being sent at this time. Any new allergies since your discharge? No  Dietary orders reviewed? Yes Do you have support at home? Yes   Follow up appointments reviewed:  PCP Hospital f/u appt confirmed? No  Scheduled to see  on  @ . Specialist Hospital f/u appt confirmed? No  Scheduled to see  on  @ . Are transportation arrangements needed? No  If their condition worsens, is the pt aware to call PCP or go to the Emergency Dept.? Yes Was the patient provided with contact information for the PCP's office or ED? Yes Was to pt encouraged to call back with questions or concerns? Yes  Shukri Nistler [facility]  [facility], [facility] Guide Direct Dial: [phone]  Website: [email or web address]

## 2022-09-30 ENCOUNTER — Ambulatory Visit: Payer: Medicare Other | Admitting: Physical Therapy

## 2022-09-30 ENCOUNTER — Encounter: Payer: Self-pay | Admitting: Physical Therapy

## 2022-09-30 DIAGNOSIS — M6281 Muscle weakness (generalized): Secondary | ICD-10-CM

## 2022-09-30 DIAGNOSIS — R296 Repeated falls: Secondary | ICD-10-CM

## 2022-09-30 DIAGNOSIS — R2681 Unsteadiness on feet: Secondary | ICD-10-CM

## 2022-09-30 DIAGNOSIS — H903 Sensorineural hearing loss, bilateral: Secondary | ICD-10-CM | POA: Diagnosis not present

## 2022-10-02 ENCOUNTER — Ambulatory Visit: Payer: Medicare Other | Admitting: Physical Therapy

## 2022-10-03 ENCOUNTER — Other Ambulatory Visit: Payer: Self-pay | Admitting: Family Medicine

## 2022-10-05 ENCOUNTER — Ambulatory Visit: Payer: Medicare Other | Admitting: Behavioral Health

## 2022-10-05 DIAGNOSIS — R296 Repeated falls: Secondary | ICD-10-CM | POA: Diagnosis not present

## 2022-10-05 DIAGNOSIS — F3181 Bipolar II disorder: Secondary | ICD-10-CM | POA: Diagnosis not present

## 2022-10-05 DIAGNOSIS — Z79899 Other long term (current) drug therapy: Secondary | ICD-10-CM

## 2022-10-05 NOTE — Progress Notes (Unsigned)
                Shari Horn L Farryn Linares, LMFT 

## 2022-10-06 NOTE — Progress Notes (Signed)
[facility] Counselor/Therapist Progress Note  Patient ID: Shari Horn, MRN: [id number],    Date: 10/05/2022  Time Spent: 55 min In Person @ [facility] Office Time In: 1:00pm Time Out: 1:55pm  Treatment Type: Individual Therapy  Reported Symptoms: Elevated anx/dep & stress today as Pt considers our discussions in psychotherapy about her early childhood & the impact of her upbringing on her life.   Mental Status Exam: Appearance:  Disheveled   Pt is normally well kempt-she rushed to appt today  Behavior: Appropriate and Sharing  Motor: Normal  Speech/Language:  Clear and Coherent  Affect: Appropriate  Mood: anxious  Thought process: normal  Thought content:   WNL  Sensory/Perceptual disturbances:   WNL  Orientation: oriented to person, place, time/date, and situation  Attention: Good  Concentration: Good  Memory: WNL  Fund of knowledge:  Good  Insight:   Good  Judgment:  Good  Impulse Control: Good   Risk Assessment: Danger to Self:  No Self-injurious Behavior: No Danger to Others: No Duty to Warn:no Physical Aggression / Violence:No  Access to Firearms a concern: No  Gang Involvement:No   Subjective: Pt cannot make it to PT appts right now. She feels, "old & tired". She will agree w/her Physician to do the exercises she has learned @ home now.   Pt related her home environment today growing up w/6 Siblings, chaos & lots of roaches in the home. This disgusts her today as she realizes how much her Mother did to keep her Family together & how her use of ETOH evolved dysfunctionally & impacted everyone. Her Mother eventually was a binge drinker & would drink for wks @ a time & be out of it. She was controlling & did not want help keeping the home. Pt recalls how her Mother grew up w/a Dad who was a Physician, in a nice/comfortable home, had a Hospital doctor, & was well-educated. Once her Mother married her Father, she did nothing w/a career using her intellect. This  impacted her Mother's beh towards the 7 children & Pt thinks sadly about this today.   Pt is examining the larger issues of Paternalism in the 1940's & 1950's as it relates to the oppression of women. Pt recounts several stories today that impacted her since childhood regarding weight, self-agency, & how women were objectified.    Interventions: Solution-Oriented/Positive Psychology, Psycho-education/Bibliotherapy, and Family Systems  Diagnosis:Bipolar 2 disorder, major depressive episode (HCC)  Long-term current use of benzodiazepine  Falls frequently  Plan: Next visit, Shari Horn will cont her exploration of her life & further determine, "How to plant my own garden!"  Target Date: 11/03/2022  Progress: 6  Frequency: Once every 2-3 wks  Modality: Claretta Fraise, LMFT

## 2022-10-07 ENCOUNTER — Ambulatory Visit: Payer: Medicare Other | Admitting: Family Medicine

## 2022-10-07 ENCOUNTER — Ambulatory Visit: Payer: Medicare Other | Admitting: Physical Therapy

## 2022-10-08 ENCOUNTER — Ambulatory Visit: Payer: Medicare Other | Admitting: Audiologist

## 2022-10-08 DIAGNOSIS — H903 Sensorineural hearing loss, bilateral: Secondary | ICD-10-CM | POA: Diagnosis not present

## 2022-10-08 DIAGNOSIS — R296 Repeated falls: Secondary | ICD-10-CM | POA: Diagnosis not present

## 2022-10-08 DIAGNOSIS — R2681 Unsteadiness on feet: Secondary | ICD-10-CM | POA: Diagnosis not present

## 2022-10-08 DIAGNOSIS — M6281 Muscle weakness (generalized): Secondary | ICD-10-CM | POA: Diagnosis not present

## 2022-10-09 ENCOUNTER — Encounter: Payer: Self-pay | Admitting: Family Medicine

## 2022-10-09 ENCOUNTER — Ambulatory Visit: Payer: Medicare Other | Admitting: Physical Therapy

## 2022-10-09 NOTE — Procedures (Signed)
Outpatient Audiology and [facility] [address] [place], [place]  [address] [phone]  AUDIOLOGICAL  EVALUATION  NAME: Shari Horn     DOB:   1940/06/29      MRN: [id number]                                                                                     DATE: 10/09/2022     REFERENT: Ardith Dark, MD STATUS: Outpatient DIAGNOSIS: Asymmetric Sensorineural Hearing Loss  History: [place] was seen for an audiological evaluation due to hearing loss in both ears. Dulse is aware that she has some loss and she feels it is worse in the right ear.  She finds herself turning the left ear to people when she needs to understand them.  She recently had wax removed from her right ear.  She still feels the right ear is worse.  She is a fall risk for which she is receiving physical therapy.  She has a mild cognitive impairment.   Patient was half an hour late to evaluation, limited case history and counseling provided.    Evaluation:  Otoscopy showed a clear view of the tympanic membranes, bilaterally Tympanometry results were consistent with normal middle ear function bilaterally Audiometric testing was completed using Conventional Audiometry techniques with insert earphones and supraural headphones. Test results are consistent with mild sloping to profound sensorineural hearing loss bilaterally.  Asymmetry with right ear worse noted 4 through 8 kHz. Speech Recognition Thresholds were obtained at 65dB HL in the right ear and at 60dB HL in the left ear. Word Recognition Testing was completed at 100dB HL and Bindu scored 40% in the right ear and 92% in the left ear.  Significant asymmetry present and word recognition.  Results:  The test results were reviewed with Dewayne Hatch. 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  She needs to follow-up with the ear nose and throat physician due to the difference between her ears and her hearing.   Her ability to understand speech is poor in the right ear even at amplified levels.  Georgiann needs hearing aids for both ears.  She was given copies of her hearing test and a list of local providers that work with hearing aids.  It was recommended she go ahead and pursue hearing aids but she needs to still follow-up with Dr. Irene Pap.   Recommendations:  Amplification necessary for both ears due to severe high-frequency hearing loss.  Trial of hearing aid in the right ear recommended but due to poor word recognition may not be beneficial. Follow-up with otolaryngology due to significant asymmetry and word recognition and high-frequency hearing.  43 minutes spent testing and counseling on results.   If you have any questions please feel free to contact me at [phone].  Ammie Ferrier Audiologist, Au.D., CCC-A 10/09/2022  9:38 AM  Cc: Ardith Dark, MD

## 2022-10-14 ENCOUNTER — Ambulatory Visit: Payer: Medicare Other | Admitting: Physical Therapy

## 2022-10-16 ENCOUNTER — Ambulatory Visit: Payer: Medicare Other | Admitting: Physical Therapy

## 2022-10-21 ENCOUNTER — Ambulatory Visit: Payer: Medicare Other | Admitting: Physical Therapy

## 2022-10-22 ENCOUNTER — Ambulatory Visit (INDEPENDENT_AMBULATORY_CARE_PROVIDER_SITE_OTHER): Payer: Medicare Other | Admitting: Otolaryngology

## 2022-10-23 ENCOUNTER — Ambulatory Visit: Payer: Medicare Other | Admitting: Physical Therapy

## 2022-10-27 ENCOUNTER — Ambulatory Visit: Payer: Medicare Other | Admitting: Behavioral Health

## 2022-10-27 DIAGNOSIS — F3181 Bipolar II disorder: Secondary | ICD-10-CM

## 2022-10-27 NOTE — Progress Notes (Signed)
[facility] Counselor/Therapist Progress Note  Patient ID: Shari Horn, MRN: [id number],    Date: 10/27/2022  Time Spent: 45 min In Person @ [facility] Office   Treatment Type: Individual Therapy  Reported Symptoms: Reduction in elevated levels of anx/dep & stress due to Family Hx  Mental Status Exam: Appearance:  Casual     Behavior: Appropriate and Sharing  Motor: Normal  Speech/Language:  Clear and Coherent and Normal Rate  Affect: Appropriate  Mood: normal  Thought process: normal  Thought content:   WNL  Sensory/Perceptual disturbances:   WNL  Orientation: oriented to person, place, time/date, and situation  Attention: Good  Concentration: Good  Memory: WNL  Fund of knowledge:  Good  Insight:   Good  Judgment:  Good  Impulse Control: Good   Risk Assessment: Danger to Self:  No Self-injurious Behavior: No Danger to Others: No Duty to Warn:no Physical Aggression / Violence:No  Access to Firearms a concern: No  Gang Involvement:No   Subjective: Pt is upbeat today & also c/o feeling unsteady & brain fogginess. She is striving to, "Plant my own garden" in my elder yrs. Pt has much renewed appreciation for all her Mother handled as she & Sibs were growing up. She identified the roles of her Sibs & how growing up impacted everyone differently as an Adult. Her Bros Reece Levy was an Tree surgeon in [place]. She shares his work, "[employer]" today. Oldest Str Carney Bern was the first to id as an Alcoholic & first to attend AA Mtgs. Her Husb was also an abuser of ETOH. 7 yrs ago, her Str Corrie Dandy committed suicide in her 69's. Her Str Peggy who was 46 mos younger than Corrie Dandy helped Pt clean their Bros Michael[employer] A[address]. He also died 7 yrs ago from ETOH-related death.   Pt sts she became a traveller like her Ollen Barges.   Pt's Mother was identified by several Sibs as controlling. Her older Str Carney Bern through water in her face when she had a hangover. Pt thought this was awfully brave  to stand up to Mom.   Growing up in the Family home resulted in yelling & screaming-feelings were forbidden & frowned upon. She is keenly aware of the gendered nature of the roles in her Family & true to the era. Pt does not feel trapped by these early role description any longer; she is freeing herself from these outdated notions that kept her & Sibs "trapped".   Pt recalls her first born Terrace Arabia died the same day he was born. She was not allowed to see or hold her Baby, but insisted on naming him. He was Baptized by Control and instrumentation engineer w/out her permission. Being Catholic was a strict Materials engineer for Pt. During the last 4 yrs of marriage to her [facility], she told him he wore her out, so he moved out. Her Dtr [name] moved w/him.   Interventions: Family Systems  Diagnosis:Bipolar 2 disorder, major depressive episode (HCC)  Plan: Nadalie is on her own path to understand her childhood better. She has a new appreciation for her Family & much of it saddens her today. She will cont on her path in psychotherapy to piece tgthr her Family's life through materials & documents that bring new meaning to her life now.  Target Date: 12/04/2022  Progress: 7  Frequency: Once every 2-3 wks  Modality: Claretta Fraise, LMFT

## 2022-10-27 NOTE — Progress Notes (Signed)
                Anastasya Jewell L Farryn Linares, LMFT 

## 2022-10-28 ENCOUNTER — Ambulatory Visit: Payer: Medicare Other | Admitting: Physical Therapy

## 2022-10-29 ENCOUNTER — Telehealth (INDEPENDENT_AMBULATORY_CARE_PROVIDER_SITE_OTHER): Payer: Self-pay | Admitting: Physician Assistant

## 2022-10-29 NOTE — Telephone Encounter (Signed)
Shari Horn came to the office today to request appointment. She was scheduled to see Dr. Irene Pap on 10/22/2022, she was a no show for this appointment. Today she reports she has been calling [phone] ( fax number) but was unable to reach [place]. She is requesting an immediate appointment to discuss her audiology results. I apologized that she was unable to reach out office, I did show her the correct number to call and let her know that our office would reach out to her to reschedule the appointment that she missed but unfortunately we do not have availability today.

## 2022-10-30 ENCOUNTER — Ambulatory Visit: Payer: Medicare Other | Admitting: Physical Therapy

## 2022-10-31 ENCOUNTER — Other Ambulatory Visit: Payer: Self-pay | Admitting: Internal Medicine

## 2022-11-02 ENCOUNTER — Other Ambulatory Visit: Payer: Self-pay | Admitting: Internal Medicine

## 2022-11-03 ENCOUNTER — Other Ambulatory Visit: Payer: Self-pay | Admitting: Internal Medicine

## 2022-11-23 ENCOUNTER — Encounter: Payer: Self-pay | Admitting: Internal Medicine

## 2022-11-23 ENCOUNTER — Ambulatory Visit: Payer: Medicare Other | Attending: Internal Medicine | Admitting: Internal Medicine

## 2022-11-23 VITALS — BP 140/80 | HR 82 | Ht 67.0 in | Wt 175.0 lb

## 2022-11-23 DIAGNOSIS — I340 Nonrheumatic mitral (valve) insufficiency: Secondary | ICD-10-CM | POA: Diagnosis not present

## 2022-11-23 DIAGNOSIS — I351 Nonrheumatic aortic (valve) insufficiency: Secondary | ICD-10-CM | POA: Diagnosis not present

## 2022-11-23 DIAGNOSIS — I6523 Occlusion and stenosis of bilateral carotid arteries: Secondary | ICD-10-CM

## 2022-11-23 MED ORDER — EZETIMIBE 10 MG PO TABS: 10.0000 mg | ORAL_TABLET | Freq: Every day | ORAL | 3 refills | Status: DC

## 2022-11-23 MED ORDER — ROSUVASTATIN CALCIUM 40 MG PO TABS: 40.0000 mg | ORAL_TABLET | Freq: Every day | ORAL | 3 refills | Status: DC

## 2022-11-23 NOTE — Progress Notes (Unsigned)
OFFICE NOTE  Chief Complaint:  No complaints  Primary Care Physician: Ardith Dark, MD  HPI:  Shari Horn is a 82 year old female who was referred to me for irregular heartbeat and PVCs, as well as an abnormal screening carotid ultrasound. We repeated that carotid Doppler which indicates increased velocity on the left internal carotid up to 206 cm/second in the posterior part of the internal carotid artery consistent with 50-69% left internal carotid artery stenosis. There was a mild amount of plaque in the right carotid, 0-49% stenosis, neither of which meets criteria for intervention at this time. In addition we performed an echocardiogram in the office, indicating an EF of 60-65%, mild diastolic dysfunction. There was trivial mitral regurgitation and aortic regurgitation, but no significant stenosis. I reviewed the results with her today and feel like she is doing quite well but would recommend a repeat lipid profile and more aggressive lipid management as necessary. I have copied you on that laboratory work so that you may review it, and we will plan to see her back annually with repeat carotid Dopplers at that time. She, in fact, was due to go over repeat dopplers today, but has not had them performed.  She was having palpitations, PVCs which are not as worrisome given the fact that she has normal LV function and no signs or symptoms of ischemia. I recommended decreasing her caffeine intake which may be difficult to do as she is reportedly self-addicted, but she does drink about 1 small pot, or 4 cups of coffee every day and that certainly can contribute significantly to her PVCs. She now reports the palpitations have improved.  Finally, she recently had cholesterol testing through her primary care provider. Her total cholesterol was 233, triglycerides 183, HDL 41 LDL 156. Based on this for right torn was increased from 10/20 mg to 10/40 mg. Her goal LDL, however is less than 70, which means  she needs an additional 50% reduction in her cholesterol. I'm not sure that this is achievable with an increase in her simvastatin by 20 mg.  I saw Shari Horn back in the office today. She recently was seen in emergency department for chest discomfort which was after grieving for friend who had committed suicide. She feels that this was all related to the grieving and cardiovascular workup was negative. In November she had carotid Dopplers which show stable carotid stenoses. Her particle numbers remain elevated with LDL-P of 1795, despite being on Crestor 20 and Zetia 10 mg. I increased her Crestor to 40 mg daily. She's had very high LDLs in the past and significant coronary stenoses. 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  Shari Horn returns today for follow-up. Overall she feels well. At her last cholesterol check about a year ago she had excellent control of her cholesterol with an LDL particle number just over thousand. She apparently had recheck by her primary care provider this fall. She also had repeat carotid Dopplers which shows stable moderate carotid stenoses. She denies any side effects from her cholesterol medicine. She tells me that she's planning a trip to the [place] and significant altitude and is interested in medication for that. I'm happy to provide her acetazolamide.  03/24/2018  Shari Horn is seen today in follow-up.  Over the past year she is done well.  She has had marked improvement in her lipid profile.  In fact now her most recent labs showed a total cholesterol of 91 and LDL  of 30.  I think there is been significant dietary changes it may have been responsible for this.  Because of her history of carotid artery disease, I am hesitant to back off on her medications.  In fact the lower LDL likely the better for her.  She is otherwise asymptomatic.  In the fall she did unfortunately have a hip fracture and had to have surgery for that.  But otherwise she is doing  well.  05/11/2019  Shari Horn is seen today in follow-up.  She had unfortunately experienced several falls recently including a hip fracture requiring hip replacement, femur injury as well as a foot break.  She was found to have some osteopenia if not possible osteoporosis.  She is only on calcium vitamin D but not on any type of bone stimulant.  Her cardiac standpoint denies any chest pain or worsening shortness of breath.  Her most recent carotid Doppler showed mild to moderate carotid disease.  Her lipids are well controlled including labs a week ago which showed total cholesterol 101, triglycerides 60, HDL 39 and a LDL of 49.  Blood pressure is also reasonably well controlled.  PMHx:  Past Medical History:  Diagnosis Date   Carotid artery disease (HCC)    Depression    Dyslipidemia    Hyperlipidemia    Hypothyroidism    Mood disorder (HCC)    BPD   PVC's (premature ventricular contractions)     Past Surgical History:  Procedure Laterality Date   BREAST BIOPSY     BREAST EXCISIONAL BIOPSY Left 1990   BREAST EXCISIONAL BIOPSY Left 1996   Carotid Doppler  12/2011   0-49% RICA stenosis, 50-69% LICA stenosis   TOTAL HIP ARTHROPLASTY Left 12/12/2017   Procedure: TOTAL HIP ARTHROPLASTY ANTERIOR APPROACH;  Surgeon: Samson Frederic, MD;  Location: [facility] OR;  Service: Orthopedics;  Laterality: Left;   TRANSTHORACIC ECHOCARDIOGRAM  12/2011   EF 60-65%, grade 1 diastolic dysfunction; trivial AV regurg; calcified MV annulus    FAMHx:  Family History  Problem Relation Age of Onset   Heart failure Mother    Thyroid disease Mother    Hypertension Mother    Diabetes Mother    Arthritis/Rheumatoid Father    Heart Problems Maternal Grandmother    Heart disease Brother        valve replacement   Heart disease Child    Colon cancer Neg Hx    Esophageal cancer Neg Hx    Stomach cancer Neg Hx     SOCHx:   reports that she quit smoking about 49 years ago. Her smoking use included  cigarettes. She started smoking about 59 years ago. She has been exposed to tobacco smoke. She has never used smokeless tobacco. She reports that she does not drink alcohol and does not use drugs.  ALLERGIES:  Allergies  Allergen Reactions   Codeine Nausea And Vomiting and Nausea Only    Other reaction(s): Unknown    ROS: A comprehensive review of systems was negative.  HOME MEDS: Current Outpatient Medications  Medication Sig Dispense Refill   acetaminophen (TYLENOL) 500 MG tablet Take 1 tablet (500 mg total) by mouth every 6 (six) hours as needed. 30 tablet 0   azelastine (ASTELIN) 0.1 % nasal spray Place 2 sprays into both nostrils 2 (two) times daily. 30 mL 12   baclofen (LIORESAL) 10 MG tablet TAKE 1/2 TABLET(5 MG) BY MOUTH THREE TIMES DAILY 135 tablet 0   buPROPion (WELLBUTRIN XL) 150 MG 24 hr tablet TAKE  1 TABLET(150 MG) BY MOUTH DAILY 90 tablet 3   cholecalciferol (VITAMIN D3) 25 MCG (1000 UT) tablet Take 2,000 Units by mouth daily.     ezetimibe (ZETIA) 10 MG tablet TAKE 1 TABLET BY MOUTH DAILY 30 tablet 0   FLUoxetine (PROZAC) 20 MG capsule TAKE 3 CAPSULES(60 MG) BY MOUTH DAILY 270 capsule 3   lamoTRIgine (LAMICTAL) 25 MG tablet TAKE 1 TABLET(25 MG) BY MOUTH DAILY. 90 tablet 3   levothyroxine (SYNTHROID) 75 MCG tablet TAKE 1 TABLET BY MOUTH DAILY 90 tablet 1   meclizine (ANTIVERT) 25 MG tablet Take 1 tablet (25 mg total) by mouth 3 (three) times daily as needed for dizziness. 30 tablet 0   Multiple Vitamin (MULTIVITAMIN WITH MINERALS) TABS tablet Take 1 tablet by mouth daily.     pantoprazole (PROTONIX) 40 MG tablet Take 1 tablet (40 mg total) by mouth daily. 90 tablet 3   polyethylene glycol (MIRALAX) 17 g packet Take 17 g by mouth daily as needed for mild constipation. 14 each 0   QUEtiapine (SEROQUEL) 300 MG tablet TAKE 1 TABLET(300 MG) BY MOUTH AT BEDTIME 30 tablet 5   rosuvastatin (CRESTOR) 40 MG tablet TAKE 1 TABLET BY MOUTH DAILY 30 tablet 0   senna-docusate (SENOKOT-S)  8.6-50 MG tablet Take 1 tablet by mouth at bedtime as needed for mild constipation or moderate constipation. 20 tablet 0   No current facility-administered medications for this visit.    LABS/IMAGING: No results found for this or any previous visit (from the past 48 hour(s)). No results found.  VITALS: BP (!) 140/80   Pulse 82   Ht 5\' 7"  (1.702 m)   Wt 175 lb (79.4 kg)   LMP  (LMP Unknown)   SpO2 96%   BMI 27.41 kg/m   EXAM: General appearance: alert and no distress Neck: no carotid bruit, no JVD and thyroid not enlarged, symmetric, no tenderness/mass/nodules Lungs: clear to auscultation bilaterally Heart: regular rate and rhythm, S1, S2 normal, no murmur, click, rub or gallop Abdomen: soft, non-tender; bowel sounds normal; no masses,  no organomegaly Extremities: extremities normal, atraumatic, no cyanosis or edema Pulses: 2+ and symmetric Skin: Skin color, texture, turgor normal. 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   Mrs. Lory seems to be doing well with mild to moderate carotid artery disease.  This might have been slightly worse than the last study and she is due for repeat in September.  Her lipids are very well controlled LDL at 49.  She denies any palpitations.  There is no chest pain.  Unfortunately she has had several falls and had hip replacement.  She also has some osteopenia which she will need to be aggressive about.  Follow-up annually or sooner as necessary.  Chrystie Nose, MD, [facility], FACP  [place]  [facility]  Medical Director of the Advanced Lipid Disorders &  Cardiovascular Risk Reduction Clinic Diplomate of the American Board of Clinical Lipidology Attending Cardiologist  Direct Dial: [phone]  Fax:  [phone]  Website:  [email or web address] Nicely Shantera Monts 11/23/2022, 8:27 AM

## 2022-11-23 NOTE — Patient Instructions (Signed)
Medication Instructions:  NO CHANGES - continue current meds   Testing/Procedures: Your physician has requested that you have an echocardiogram. Echocardiography is a painless test that uses sound waves to create images of your heart. It provides your doctor with information about the size and shape of your heart and how well your heart's chambers and valves are working. This procedure takes approximately one hour. There are no restrictions for this procedure. Please do NOT wear cologne, perfume, aftershave, or lotions (deodorant is allowed). Please arrive 15 minutes prior to your appointment time.  Please note: We ask at that you not bring children with you during ultrasound (echo/ vascular) testing. Due to room size and safety concerns, children are not allowed in the ultrasound rooms during exams. Our front office staff cannot provide observation of children in our lobby area while testing is being conducted. An adult accompanying a patient to their appointment will only be allowed in the ultrasound room at the discretion of the ultrasound technician under special circumstances. We apologize for any inconvenience. DUE 11/2023   Follow-Up: At [facility], you and your health needs are our priority.  As part of our continuing mission to provide you with exceptional heart care, we have created designated Provider Care Teams.  These Care Teams include your primary Cardiologist (physician) and Advanced Practice Providers (APPs -  Physician Assistants and Nurse Practitioners) who all work together to provide you with the care you need, when you need it.  We recommend signing up for the patient portal called "MyChart".  Sign up information is provided on this After Visit Summary.  MyChart is used to connect with patients for Virtual Visits (Telemedicine).  Patients are able to view lab/test results, encounter notes, upcoming appointments, etc.  Non-urgent messages can be sent to your provider  as well.   To learn more about what you can do with MyChart, go to [email or web address].    Your next appointment:   12 months with Dr. Rennis Golden or NP/PA

## 2022-11-26 ENCOUNTER — Telehealth: Payer: Self-pay | Admitting: Family Medicine

## 2022-11-26 NOTE — Telephone Encounter (Signed)
Pt states the RX  QUEtiapine (SEROQUEL) 300 MG tablet  Was sent in as a 30 days and it should be a 90 days. Please resend RX.

## 2022-12-01 ENCOUNTER — Ambulatory Visit: Payer: Medicare Other | Admitting: Behavioral Health

## 2022-12-01 DIAGNOSIS — Z79899 Other long term (current) drug therapy: Secondary | ICD-10-CM

## 2022-12-01 DIAGNOSIS — R296 Repeated falls: Secondary | ICD-10-CM

## 2022-12-01 DIAGNOSIS — F3181 Bipolar II disorder: Secondary | ICD-10-CM

## 2022-12-01 NOTE — Progress Notes (Signed)
                Anastasya Jewell L Farryn Linares, LMFT 

## 2022-12-03 ENCOUNTER — Other Ambulatory Visit: Payer: Self-pay | Admitting: Family Medicine

## 2022-12-03 DIAGNOSIS — F3181 Bipolar II disorder: Secondary | ICD-10-CM

## 2022-12-04 NOTE — Progress Notes (Signed)
[facility] Counselor/Therapist Progress Note  Patient ID: Shari Horn, MRN: [id number],    Date: 12/01/2022  Time Spent: 55 min In Person @ [facility] Office Time In: 2:00pm Time Out: 2:55pm   Treatment Type: Individual Therapy  Reported Symptoms: Pt is in the process of reviewing her childhood & the times growing up that were challenging & a struggle. This process is helping her create some cohesion about her Family.  Mental Status Exam: Appearance:  Casual     Behavior: Appropriate and Sharing  Motor: Normal  Speech/Language:  Clear and Coherent  Affect: Appropriate  Mood: normal  Thought process: normal  Thought content:   WNL  Sensory/Perceptual disturbances:   WNL  Orientation: oriented to person, place, and time/date & situation  Attention: Good  Concentration: Good  Memory: WNL  Fund of knowledge:  Good  Insight:   Good  Judgment:  Good  Impulse Control: Good   Risk Assessment: Danger to Self:  No Self-injurious Behavior: No Danger to Others: No Duty to Warn:no Physical Aggression / Violence:No  Access to Firearms a concern: No  Gang Involvement:No   Subjective: Pt is 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 She suggests indiv's get in touch w/Publishers, Agents, & Editors or w/someone who knows these indiv's to assist them in their career. Send them product & let them see the work. She endorses Elby Showers book, 'The Craft of Writing' & [employer]'s work.   Pt is recalling more of the story of [address] (her book may be titled, '[address] [place]: Caught in the Box') where her Family spent many yrs growing up. Her Mother was an amazing lady who raised 7 children & lft her career ambitions behind for Family.   Interventions: Family Systems  Diagnosis:Bipolar 2 disorder, major depressive episode (HCC)  Long-term current use of benzodiazepine  Falls frequently  Plan: For our next session  Ozelia will cont to narrate her Family's Hx & that of ea Sibling once she has concluded about her Mother.   Target Date: 01/03/2023  Progress: 6  Frequency: Once every 2-3 wks  Modality: Claretta Fraise, LMFT

## 2022-12-08 ENCOUNTER — Other Ambulatory Visit: Payer: Self-pay | Admitting: Family Medicine

## 2022-12-30 ENCOUNTER — Ambulatory Visit: Payer: Medicare Other | Admitting: Behavioral Health

## 2022-12-30 DIAGNOSIS — F3181 Bipolar II disorder: Secondary | ICD-10-CM

## 2022-12-30 NOTE — Progress Notes (Unsigned)
                Shari Jewell L Farryn Linares, LMFT 

## 2023-01-17 DIAGNOSIS — M79645 Pain in left finger(s): Secondary | ICD-10-CM | POA: Diagnosis not present

## 2023-01-18 DIAGNOSIS — S63285A Dislocation of proximal interphalangeal joint of left ring finger, initial encounter: Secondary | ICD-10-CM | POA: Diagnosis not present

## 2023-01-18 DIAGNOSIS — G8918 Other acute postprocedural pain: Secondary | ICD-10-CM | POA: Diagnosis not present

## 2023-01-26 DIAGNOSIS — M79645 Pain in left finger(s): Secondary | ICD-10-CM | POA: Diagnosis not present

## 2023-02-01 ENCOUNTER — Ambulatory Visit: Payer: Medicare Other | Admitting: Behavioral Health

## 2023-02-08 DIAGNOSIS — S63285D Dislocation of proximal interphalangeal joint of left ring finger, subsequent encounter: Secondary | ICD-10-CM | POA: Diagnosis not present

## 2023-02-18 ENCOUNTER — Ambulatory Visit: Payer: Medicare Other | Admitting: Behavioral Health

## 2023-02-18 DIAGNOSIS — S63285D Dislocation of proximal interphalangeal joint of left ring finger, subsequent encounter: Secondary | ICD-10-CM | POA: Diagnosis not present

## 2023-02-18 DIAGNOSIS — F3181 Bipolar II disorder: Secondary | ICD-10-CM

## 2023-02-18 NOTE — Progress Notes (Signed)
[facility] Counselor/Therapist Progress Note  Patient ID: Shari Horn, MRN: [id number],    Date: 02/18/2023  Time Spent: 55 min In Person @ [facility] Office Time In: 9:00am Time Out: 9:55am   Treatment Type: Individual Therapy  Reported Symptoms: Stable levels of anx/dep punctuated today by Son Chester's Aortic Valve Replacement surgery in [facility], [place]  Mental Status Exam: Appearance:  Casual     Behavior: Appropriate and Sharing  Motor: Normal  Speech/Language:  Clear and Coherent  Affect: Appropriate  Mood: euthymic  Thought process: normal  Thought content:   WNL  Sensory/Perceptual disturbances:   WNL  Orientation: oriented to person, place, time/date, and situation  Attention: Good  Concentration: Good  Memory: WNL; Pt c/o forgetting more as she ages  Fund of knowledge:  Good  Insight:   Good  Judgment:  Good  Impulse Control: Good   Risk Assessment: Danger to Self:  No Self-injurious Behavior: No Danger to Others: No Duty to Warn:no Physical Aggression / Violence:No  Access to Firearms a concern: No  Gang Involvement:No   Subjective: Pt is concerned for her Son Chester's Cardiac surgery this morning. It began @ 7:00am & she will be updated throughout the day by Jeneen Rinks, who is a Ret'd PA-C since her Dx of MS-like d/o & Str Peggy who is a Ret'd Charity fundraiser. She knew he may need this type of surgery when he was born & Str [name] also remembers this from childhood.  Pt  speaking about her older Haskel Khan who was, "severely mentally impaired". He had a fierce temper & Family knew to "keep him @ arms length". Pt has very vast memories about him & he began teaching her Art in her 80's after her divorce. He was classically trained as an Tree surgeon & she & Son Annia Friendly have many pieces of his work. He died in 12-02-2021.   Pt relates she did not know how to be a Mother or a Wife due to lack of having a model growing up. She learned as she went & holds inc'd  knowledge about herself since her divorce 22yrs ago. Her & 2 other Sibs are in Recovery & this is due to childhood experiences & Family Hx. Pt routinely takes a [employer] btwn Christmas & NYD in [place] @ [facility] [place] to regrp & get her footing for the upcoming year.   Interventions: Family Systems  Diagnosis:Bipolar 2 disorder, major depressive episode (HCC)  Plan: Mizuki is revisiting her memories from her life & narrating her understandings in psychotherapy today. She wants to cont doing this through the lives of all her Siblings & speak about her divorce process along the way.  Target Date: 03/20/2023  Progress: 7  Frequency: Once every 3 wks  Modality: Claretta Fraise, LMFT

## 2023-02-18 NOTE — Progress Notes (Signed)
Deneise Lever, LMFT

## 2023-02-22 DIAGNOSIS — S63285D Dislocation of proximal interphalangeal joint of left ring finger, subsequent encounter: Secondary | ICD-10-CM | POA: Diagnosis not present

## 2023-02-28 ENCOUNTER — Other Ambulatory Visit: Payer: Self-pay | Admitting: Family Medicine

## 2023-02-28 DIAGNOSIS — F3181 Bipolar II disorder: Secondary | ICD-10-CM

## 2023-03-01 DIAGNOSIS — S63285D Dislocation of proximal interphalangeal joint of left ring finger, subsequent encounter: Secondary | ICD-10-CM | POA: Diagnosis not present

## 2023-03-08 ENCOUNTER — Ambulatory Visit: Payer: Medicare Other | Admitting: Behavioral Health

## 2023-03-08 DIAGNOSIS — F3181 Bipolar II disorder: Secondary | ICD-10-CM | POA: Diagnosis not present

## 2023-03-08 NOTE — Progress Notes (Signed)
 [facility] Counselor/Therapist Progress Note  Patient ID: Shari Horn, MRN: [id number],    Date: 05/24/2023  Time Spent: 50 min In Person @ [facility] Office Time In: 1:00pm Time Out: 1:50pm   Treatment Type: Individual Therapy  Reported Symptoms: Reduction in anx/dep & stress due to familial Hx of events  Mental Status Exam: Appearance:  Casual     Behavior: Appropriate and Sharing  Motor: Normal  Speech/Language:  Clear and Coherent  Affect: Appropriate  Mood: normal  Thought process: normal  Thought content:   WNL  Sensory/Perceptual disturbances:   WNL  Orientation: oriented to person, place, time/date, and situation  Attention: Good  Concentration: Good  Memory: WNL  Fund of knowledge:  Good  Insight:   Good  Judgment:  Good  Impulse Control: Good   Risk Assessment: Danger to Self:  No Self-injurious Behavior: No Danger to Others: No Duty to Warn:no Physical Aggression / Violence:No  Access to Firearms a concern: No  Gang Involvement:No   Subjective: Pt is reviewing her 30 yrs of marriage today. As part of her process, she has decided to recount her Family's Hx & today her marriage to her children's Father is on her mind. 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 Her Son also encountered health issues growing up. The disruption in her marital security caused Pt to carry resentment towards her Husb & his Parents who treated her badly.   Interventions: Family Systems  Diagnosis:Bipolar 2 disorder, major depressive episode (HCC)  Plan: We will cont to review the Hx Shari Horn wished to recount next session. She has gained insights from our visits thus far & wished to stay on the trajectory as we planned in our first session when goals were discussed. She appreciates the insights & wants to understand who she is as a woman today more deeply.  Target Date: 04/03/2023  Progress: 7  Frequency: Once every 2-3 wks  Modality:  Shari Fam, LMFT

## 2023-03-08 NOTE — Progress Notes (Signed)
   Deneise Lever, LMFT

## 2023-03-09 DIAGNOSIS — S63285D Dislocation of proximal interphalangeal joint of left ring finger, subsequent encounter: Secondary | ICD-10-CM | POA: Diagnosis not present

## 2023-03-17 DIAGNOSIS — S63285D Dislocation of proximal interphalangeal joint of left ring finger, subsequent encounter: Secondary | ICD-10-CM | POA: Diagnosis not present

## 2023-03-23 ENCOUNTER — Telehealth: Payer: Self-pay | Admitting: *Deleted

## 2023-03-23 NOTE — Telephone Encounter (Signed)
 Copied from CRM [id number]. Topic: General - Other >> Mar 22, 2023  3:32 PM Rodman Pickle T wrote: Reason for CRM: patient daughter  is calling in regarding if patient has had her  tentus shot or not patient daughter would like  a call back regarding this  [phone]  marine   LVM TDAP UPD last TDAP done on 03/04/2015 Next should be done around 03/03/2025 [facility]

## 2023-03-24 DIAGNOSIS — S63285D Dislocation of proximal interphalangeal joint of left ring finger, subsequent encounter: Secondary | ICD-10-CM | POA: Diagnosis not present

## 2023-03-30 ENCOUNTER — Ambulatory Visit: Payer: Medicare Other | Admitting: Behavioral Health

## 2023-04-07 ENCOUNTER — Ambulatory Visit: Admitting: Behavioral Health

## 2023-04-08 ENCOUNTER — Ambulatory Visit: Admitting: Behavioral Health

## 2023-04-08 DIAGNOSIS — F3181 Bipolar II disorder: Secondary | ICD-10-CM | POA: Diagnosis not present

## 2023-04-08 NOTE — Progress Notes (Signed)
   Deneise Lever, LMFT

## 2023-04-12 DIAGNOSIS — S63285D Dislocation of proximal interphalangeal joint of left ring finger, subsequent encounter: Secondary | ICD-10-CM | POA: Diagnosis not present

## 2023-04-22 ENCOUNTER — Ambulatory Visit

## 2023-05-04 ENCOUNTER — Ambulatory Visit: Admitting: Behavioral Health

## 2023-05-25 ENCOUNTER — Ambulatory Visit: Admitting: Behavioral Health

## 2023-05-27 ENCOUNTER — Other Ambulatory Visit: Payer: Self-pay | Admitting: Family Medicine

## 2023-05-27 DIAGNOSIS — F3181 Bipolar II disorder: Secondary | ICD-10-CM

## 2023-05-28 ENCOUNTER — Other Ambulatory Visit: Payer: Self-pay | Admitting: *Deleted

## 2023-05-28 DIAGNOSIS — F3181 Bipolar II disorder: Secondary | ICD-10-CM

## 2023-05-28 MED ORDER — QUETIAPINE FUMARATE 300 MG PO TABS
300.0000 mg | ORAL_TABLET | Freq: Every day | ORAL | 0 refills | Status: DC
Start: 2023-05-28 — End: 2023-08-03

## 2023-06-23 ENCOUNTER — Ambulatory Visit: Admitting: Behavioral Health

## 2023-06-28 ENCOUNTER — Other Ambulatory Visit: Payer: Self-pay | Admitting: *Deleted

## 2023-06-28 ENCOUNTER — Encounter: Payer: Self-pay | Admitting: Family Medicine

## 2023-06-28 MED ORDER — LEVOTHYROXINE SODIUM 75 MCG PO TABS: ORAL_TABLET | ORAL | 0 refills | Status: DC

## 2023-06-28 MED ORDER — BUPROPION HCL ER (XL) 150 MG PO TB24: 150.0000 mg | ORAL_TABLET | Freq: Every day | ORAL | 0 refills | Status: DC

## 2023-06-28 MED ORDER — LAMOTRIGINE 25 MG PO TABS: 25.0000 mg | ORAL_TABLET | Freq: Every day | ORAL | 0 refills | Status: DC

## 2023-07-05 ENCOUNTER — Emergency Department (HOSPITAL_COMMUNITY)

## 2023-07-05 ENCOUNTER — Emergency Department (HOSPITAL_COMMUNITY)
Admission: EM | Admit: 2023-07-05 | Discharge: 2023-07-05 | Disposition: A | Discharge status: Home / Self Care | Attending: Emergency Medicine | Admitting: Emergency Medicine

## 2023-07-05 ENCOUNTER — Encounter (HOSPITAL_COMMUNITY): Payer: Self-pay | Admitting: *Deleted

## 2023-07-05 ENCOUNTER — Other Ambulatory Visit: Payer: Self-pay

## 2023-07-05 DIAGNOSIS — R269 Unspecified abnormalities of gait and mobility: Secondary | ICD-10-CM | POA: Diagnosis not present

## 2023-07-05 DIAGNOSIS — R9082 White matter disease, unspecified: Secondary | ICD-10-CM | POA: Diagnosis not present

## 2023-07-05 DIAGNOSIS — I10 Essential (primary) hypertension: Secondary | ICD-10-CM | POA: Diagnosis not present

## 2023-07-05 DIAGNOSIS — R29818 Other symptoms and signs involving the nervous system: Secondary | ICD-10-CM | POA: Diagnosis not present

## 2023-07-05 DIAGNOSIS — R2681 Unsteadiness on feet: Secondary | ICD-10-CM | POA: Insufficient documentation

## 2023-07-05 DIAGNOSIS — R9089 Other abnormal findings on diagnostic imaging of central nervous system: Secondary | ICD-10-CM | POA: Diagnosis not present

## 2023-07-05 DIAGNOSIS — R011 Cardiac murmur, unspecified: Secondary | ICD-10-CM | POA: Diagnosis not present

## 2023-07-05 DIAGNOSIS — R531 Weakness: Secondary | ICD-10-CM | POA: Diagnosis not present

## 2023-07-05 DIAGNOSIS — Z471 Aftercare following joint replacement surgery: Secondary | ICD-10-CM | POA: Diagnosis not present

## 2023-07-05 DIAGNOSIS — R251 Tremor, unspecified: Secondary | ICD-10-CM | POA: Insufficient documentation

## 2023-07-05 DIAGNOSIS — R6889 Other general symptoms and signs: Secondary | ICD-10-CM | POA: Diagnosis not present

## 2023-07-05 LAB — CBC
HCT: 38.2 % (ref 36.0–46.0)
Hemoglobin: 12.6 g/dL (ref 12.0–15.0)
MCH: 34.2 pg — ABNORMAL HIGH (ref 26.0–34.0)
MCHC: 33 g/dL (ref 30.0–36.0)
MCV: 103.8 fL — ABNORMAL HIGH (ref 80.0–100.0)
Platelets: 275 10*3/uL (ref 150–400)
RBC: 3.68 MIL/uL — ABNORMAL LOW (ref 3.87–5.11)
RDW: 12.4 % (ref 11.5–15.5)
WBC: 7.4 10*3/uL (ref 4.0–10.5)
nRBC: 0 % (ref 0.0–0.2)

## 2023-07-05 LAB — BASIC METABOLIC PANEL WITH GFR
Anion gap: 11 (ref 5–15)
BUN: 17 mg/dL (ref 8–23)
CO2: 24 mmol/L (ref 22–32)
Calcium: 10 mg/dL (ref 8.9–10.3)
Chloride: 105 mmol/L (ref 98–111)
Creatinine, Ser: 0.96 mg/dL (ref 0.44–1.00)
GFR, Estimated: 59 mL/min — ABNORMAL LOW (ref >60)
Glucose, Bld: 91 mg/dL (ref 70–99)
Potassium: 4.4 mmol/L (ref 3.5–5.1)
Sodium: 140 mmol/L (ref 135–145)

## 2023-07-05 NOTE — Telephone Encounter (Signed)
 Copied from CRM [id number]. Topic: Appointments - Scheduling Inquiry for Clinic >> Jul 02, 2023  3:12 PM Trula Gable C wrote: Reason for CRM: Patient called in wanted an appointment for Monday with Dr.Parker 06/16, informed her that he didn't have anything for Monday but offered Tuesday appointment she stated she needs to see him Monday afternoon, would like to know if she could be squeezed in, Patient would like for someone to email her back as she stated she doesn't use Mychart and may not answer the phone

## 2023-07-05 NOTE — ED Provider Triage Note (Signed)
 Emergency Medicine Provider Triage Evaluation Note  Shari Horn , a 83 y.o. female  was evaluated in triage.  Pt complains of left arm and leg numbness/weakness that is gradually worsened over the past week.  Review of Systems  Positive: Left arm numbness/weakness Negative: Back pain, headache  Physical Exam  BP (!) 139/116 (BP Location: Left Arm)   Pulse 75   Temp 98 F (36.7 C)   Resp 18   Ht 5' 7 (1.702 m)   Wt 72.6 kg   LMP  (LMP Unknown)   SpO2 96%   BMI 25.06 kg/m  Gen:   Awake, no distress   Resp:  Normal effort  MSK:   Moves extremities without difficulty  Other:  Cranial nerves intact, reports diminished sensation over left upper and left lower extremity, mild left lower extremity weakness noted on exam but able to lift against gravity  Medical Decision Making  Medically screening exam initiated at 11:54 AM.  Appropriate orders placed.  Shari Horn was informed that the remainder of the evaluation will be completed by another provider, this initial triage assessment does not replace that evaluation, and the importance of remaining in the ED until their evaluation is complete.  83 year old female presenting to the emergency department today with left-sided weakness in the upper and lower extremity.  Symptoms have been going on now for days.  She is outside the window for acute neurointervention at this time.  Will obtain basic labs to evaluate for anemia or electrolyte abnormalities as well as an MRI of her brain for further evaluation for small CVA.   Shari Charleston, MD 07/05/23 1155

## 2023-07-05 NOTE — ED Provider Notes (Signed)
 [facility] EMERGENCY DEPARTMENT AT [facility] Provider Note   CSN: [id number] Arrival date & time: 07/05/23  1056     Patient presents with: Weakness   Shari Horn is a 83 y.o. female.    Weakness  This patient is an 83 year old female with a history of some anxiety on Wellbutrin  and Prozac , she also takes Lamictal , she takes cholesterol medications.  She presents to the hospital with approximately many years of intermittent waxing and waning symptoms which amount to a variety of things such as some tremor, some weakness of the arms and legs mostly on the left side that seems to come and go, she has a fear of falling due to imbalance and will sometimes walk with a walker.  Today she was having significant difficulty with her left arm and left leg and called her daughter who is a Advice worker and who has been in some degree caring for her with these complaints and assisting her in getting to the neurologist over the years.  They came to the emergency department because of these potentially worsening symptoms of which the patient does not complain of them actively at this time.  On arrival she was seen by the physician screening patient's and had testing and MRI ordered.  No chest pain, no shortness of breath, no changes in vision or speech, no fevers or chills, she follows with Dr. Ty Gales with the neurology service and has had a negative workup for similar symptoms approximately 3 years ago including an MRI    Prior to Admission medications   Medication Sig Start Date End Date Taking? Authorizing Provider  acetaminophen  (TYLENOL ) 500 MG tablet Take 1 tablet (500 mg total) by mouth every 6 (six) hours as needed. 07/21/21   Nanavati, Ankit, MD  azelastine  (ASTELIN ) 0.1 % nasal spray Place 2 sprays into both nostrils 2 (two) times daily. 08/27/20   Rodney Clamp, MD  baclofen  (LIORESAL ) 10 MG tablet TAKE 1/2 TABLET(5 MG) BY MOUTH THREE TIMES DAILY 04/14/22   Rodney Clamp, MD   buPROPion  (WELLBUTRIN  XL) 150 MG 24 hr tablet Take 1 tablet (150 mg total) by mouth daily. 06/28/23   Rodney Clamp, MD  cholecalciferol  (VITAMIN D3) 25 MCG (1000 UT) tablet Take 2,000 Units by mouth daily.    [provider]  ezetimibe  (ZETIA ) 10 MG tablet Take 1 tablet (10 mg total) by mouth daily. 11/23/22   Hazle Lites, MD  FLUoxetine  (PROZAC ) 20 MG capsule TAKE 3 CAPSULES(60 MG) BY MOUTH DAILY 07/30/22   Rodney Clamp, MD  lamoTRIgine  (LAMICTAL ) 25 MG tablet Take 1 tablet (25 mg total) by mouth daily. 06/28/23   Rodney Clamp, MD  levothyroxine  (SYNTHROID ) 75 MCG tablet Take 1 tablet by mouth daily 06/28/23   Rodney Clamp, MD  meclizine  (ANTIVERT ) 25 MG tablet Take 1 tablet (25 mg total) by mouth 3 (three) times daily as needed for dizziness. 09/30/17   Raymona Caldwell, MD  Multiple Vitamin (MULTIVITAMIN WITH MINERALS) TABS tablet Take 1 tablet by mouth daily.    [provider]  pantoprazole  (PROTONIX ) 40 MG tablet Take 1 tablet (40 mg total) by mouth daily. 08/04/22   Rodney Clamp, MD  polyethylene glycol (MIRALAX ) 17 g packet Take 17 g by mouth daily as needed for mild constipation. 07/27/21   Long, Shereen Dike, MD  QUEtiapine  (SEROQUEL ) 300 MG tablet Take 1 tablet (300 mg total) by mouth daily. 05/28/23   Rodney Clamp, MD  rosuvastatin  (CRESTOR ) 40 MG tablet Take 1 tablet (40 mg total) by mouth daily. 11/23/22   Hilty, Aviva Lemmings, MD  senna-docusate (SENOKOT-S) 8.6-50 MG tablet Take 1 tablet by mouth at bedtime as needed for mild constipation or moderate constipation. 07/27/21   Long, Joshua G, MD    Allergies: Codeine    Review of Systems  Neurological:  Positive for weakness.  All other systems reviewed and are negative.   Updated Vital Signs BP (!) 139/116 (BP Location: Left Arm)   Pulse 75   Temp 98 F (36.7 C)   Resp 18   Ht 1.702 m (5' 7)   Wt 72.6 kg   LMP  (LMP Unknown)   SpO2 96%   BMI 25.06 kg/m   Physical Exam Vitals and nursing note  reviewed.  Constitutional:      General: She is not in acute distress.    Appearance: She is well-developed.  HENT:     Head: Normocephalic and atraumatic.     Mouth/Throat:     Pharynx: No oropharyngeal exudate.   Eyes:     General: No scleral icterus.       Right eye: No discharge.        Left eye: No discharge.     Conjunctiva/sclera: Conjunctivae normal.     Pupils: Pupils are equal, round, and reactive to light.   Neck:     Thyroid : No thyromegaly.     Vascular: No JVD.   Cardiovascular:     Rate and Rhythm: Normal rate and regular rhythm.     Heart sounds: Murmur heard.     No friction rub. No gallop.  Pulmonary:     Effort: Pulmonary effort is normal. No respiratory distress.     Breath sounds: Normal breath sounds. No wheezing or rales.  Abdominal:     General: Bowel sounds are normal. There is no distension.     Palpations: Abdomen is soft. There is no mass.     Tenderness: There is no abdominal tenderness.   Musculoskeletal:        General: No tenderness. Normal range of motion.     Cervical back: Normal range of motion and neck supple.     Right lower leg: No edema.     Left lower leg: No edema.  Lymphadenopathy:     Cervical: No cervical adenopathy.   Skin:    General: Skin is warm and dry.     Findings: No erythema or rash.   Neurological:     Mental Status: She is alert.     Coordination: Coordination normal.     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  She has 5 out of 5 strength in all 4 extremities, she has normal sensation all 4 extremities, she has no dysmetria, her facial symmetry is normal speech is normal cranial nerves III through XII in general are normal.  Psychiatric:        Behavior:  Behavior normal.     (all labs ordered are listed, but only abnormal results are displayed) Labs Reviewed  BASIC METABOLIC PANEL WITH GFR - Abnormal; Notable for the following components:      Result Value   GFR, Estimated 59 (*)  All other components within normal limits  CBC - Abnormal; Notable for the following components:   RBC 3.68 (*)    MCV 103.8 (*)    MCH 34.2 (*)    All other components within normal limits    EKG: EKG Interpretation Date/Time:  Monday July 05 2023 12:12:00 EDT Ventricular Rate:  73 PR Interval:  180 QRS Duration:  72 QT Interval:  396 QTC Calculation: 436 R Axis:   46  Text Interpretation: Normal sinus rhythm Normal ECG When compared with ECG of 15-Sep-2022 11:05, PREVIOUS ECG IS PRESENT Confirmed by Early Glisson ([id number]) on 07/05/2023 2:46:40 PM  Radiology: MR BRAIN WO CONTRAST Result Date: 07/05/2023 CLINICAL DATA:  Neuro deficit, acute, stroke suspected EXAM: MRI HEAD WITHOUT CONTRAST TECHNIQUE: Multiplanar, multiecho pulse sequences of the brain and surrounding structures were obtained without intravenous contrast. COMPARISON:  CT of the head dated June 21, 2021 and MRI of the brain dated May 13, 2019. FINDINGS: Brain: Moderate generalized cerebral volume loss. Confluent zones of increased T2 signal within the periventricular white matter and numerous foci of increased T2 signal within the deep cerebral white matter. No evidence of hemorrhage, mass, acute cortical infarct or hydrocephalus. Vascular: Normal vascular flow voids. Skull and upper cervical spine: Normal bone marrow signal. Sinuses/Orbits: Status post bilateral lens replacement. Paranasal sinuses and mastoid air cells are clear. Other: None. IMPRESSION: 1. Age-related atrophy and moderately advanced cerebral white matter disease. No apparent acute process. Electronically Signed   By: Maribeth Shivers M.D.   On: 07/05/2023 14:22     Procedures   Medications Ordered in the ED - No data  to display                                  Medical Decision Making   This patient presents to the ED for concern of waxing and waning neurologic symptoms over years, this involves an extensive number of treatment options, and is a complaint that carries with it a high risk of complications and morbidity.  The differential diagnosis includes stroke, progressive neurologic dysfunction, vitamin deficiency such as B12, prior medical record reviewed including an MRI of the brain from 2021, this was because of frequent falls, unsteady gait, forgetfulness which had been progressive.  There was no findings at that time just some general volume loss compared to prior 2018 study.   Co morbidities / Chronic conditions that complicate the patient evaluation  High cholesterol, anxiety, chronic tremor and gait abnormalities   Additional history obtained:  Additional history obtained from EMR External records from outside source obtained and reviewed including the record including prior MRI Additionally echocardiogram from 2023 was reviewed showing that she had an ejection fraction which was good however she had some mitral valve leakage which was not severe at that time.  There was mild aortic regurg as well   Lab Tests:  I Ordered, and personally interpreted labs.  The pertinent results include: Metabolic panel is normal, CBC is unremarkable   Imaging Studies ordered:  I ordered imaging studies including MRI of the brain I independently visualized and interpreted imaging which showed no acute findings, age-related changes I agree with the radiologist interpretation   Cardiac Monitoring: / EKG:  The patient was maintained on a cardiac monitor.  I personally viewed and interpreted the cardiac monitored which showed an underlying rhythm of: Sinus rhythm   Problem List / ED Course / Critical interventions /  Medication management  The patient was unaware of her heart murmur, she and her  daughter have been made aware of this, she did have some valvular regurgitation on prior echocardiogram and they can follow-up outpatient I do not think that is the cause of her symptoms.  I have reviewed the patients home medicines and have made adjustments as needed   Consultations Obtained:  Can follow-up outpatient with neurology  Social Determinants of Health:  None   Test / Admission - Considered:  Consider admission but no acute neurologic findings on exam or on radiographic workup.  Both the patient and her daughter are very comfortable following up outpatient and have a follow-up tomorrow with PCP      Final diagnoses:  None    ED Discharge Orders     None          Early Glisson, MD 07/05/23 [phone]

## 2023-07-05 NOTE — Telephone Encounter (Signed)
 It was No appointment available with PCP for today   Patient present at ED

## 2023-07-05 NOTE — ED Triage Notes (Signed)
 Patient presents to ed via [facility] states she has had weakness in her left arm x 1 week , states she noticed numbness in her left thigh yest and now it is in her calf. Denies pain . States when she is sitting she doesn't have any numbness when she stands numbness is worse.

## 2023-07-05 NOTE — Discharge Instructions (Signed)
 Would strongly follow-up with the neurologist, call to make an appointment this week.  Your testing today showed no signs of stroke, I am somewhat concerned about this new heart murmur or I am assuming it is a new heart murmur, you did have some leaky valves on your prior echocardiogram but there was nothing that was severe at that time.  You should have your family doctor rearrange for a new echocardiogram.  Thank you for allowing us  to treat you in the emergency department today.  After reviewing your examination and potential testing that was done it appears that you are safe to go home.  I would like for you to follow-up with your doctor at your appointment tomorrow, have them obtain your records and follow-up with them to review all potential tests and results from your visit.  If you should develop severe or worsening symptoms return to the emergency department immediately

## 2023-07-06 ENCOUNTER — Ambulatory Visit: Admitting: Family Medicine

## 2023-07-09 ENCOUNTER — Ambulatory Visit (INDEPENDENT_AMBULATORY_CARE_PROVIDER_SITE_OTHER): Admitting: Family Medicine

## 2023-07-09 VITALS — BP 124/63 | HR 78 | Temp 97.5°F | Ht 67.0 in | Wt 166.4 lb

## 2023-07-09 DIAGNOSIS — R2681 Unsteadiness on feet: Secondary | ICD-10-CM | POA: Diagnosis not present

## 2023-07-09 DIAGNOSIS — F3181 Bipolar II disorder: Secondary | ICD-10-CM | POA: Diagnosis not present

## 2023-07-09 DIAGNOSIS — E038 Other specified hypothyroidism: Secondary | ICD-10-CM

## 2023-07-09 DIAGNOSIS — R011 Cardiac murmur, unspecified: Secondary | ICD-10-CM | POA: Insufficient documentation

## 2023-07-09 MED ORDER — BUPROPION HCL ER (XL) 150 MG PO TB24: 150.0000 mg | ORAL_TABLET | Freq: Every day | ORAL | 3 refills | Status: AC

## 2023-07-09 MED ORDER — EZETIMIBE 10 MG PO TABS: 10.0000 mg | ORAL_TABLET | Freq: Every day | ORAL | 3 refills | Status: AC

## 2023-07-09 MED ORDER — FLUOXETINE HCL 20 MG PO CAPS: ORAL_CAPSULE | ORAL | 3 refills | Status: AC

## 2023-07-09 MED ORDER — LEVOTHYROXINE SODIUM 75 MCG PO TABS: ORAL_TABLET | ORAL | 3 refills | Status: AC

## 2023-07-09 MED ORDER — PANTOPRAZOLE SODIUM 40 MG PO TBEC: 40.0000 mg | DELAYED_RELEASE_TABLET | Freq: Every day | ORAL | 3 refills | Status: AC

## 2023-07-09 MED ORDER — LAMOTRIGINE 25 MG PO TABS: 25.0000 mg | ORAL_TABLET | Freq: Every day | ORAL | 0 refills | Status: DC

## 2023-07-09 NOTE — Assessment & Plan Note (Signed)
 Soft systolic murmur noted on exam.  Most recent echo shows mild aortic regurgitation and mitral valve regurgitation.  Currently asymptomatic.  She will follow-up with cardiology as previously planned though do not anticipate that either of these will cause her any significant issues going forward.

## 2023-07-09 NOTE — Assessment & Plan Note (Signed)
 Overall mood is stable on current regimen of Lamictal  25 mg daily, Prozac  60 mg daily, and Seroquel  300 mg nightly.  She has a few manic days intermittently in which she finds herself painting and writing more often however has not engaged in any risky behaviors.  We did discuss having her follow back up with psychiatry however she is not interested in meds at this point.  She is currently seeing a therapist however has not been able to get into see them recently.  Will refill meds today.  Follow-up in 3 to 6 months.

## 2023-07-09 NOTE — Progress Notes (Signed)
   Shari Horn is a 83 y.o. female who presents today for an office visit.  Assessment/Plan:  Chronic Problems Addressed Today: Unsteady gait Had lengthy discussion with patient and her daughter today regarding her unsteady gait and recent ED visit.  Her workup there was negative.  She has seen sports medicine in the past and they have referred her to physical therapy however she has had difficulty with being consistent with this.  She is agreeable to another course of physical therapy though would prefer for this to be done at home if possible.  Will place referral to home health for PT and strengthening exercises.  Depending on response to physical therapy would consider have her back up with sports medicine for further imaging per last sports medicine note.  Bipolar 2 disorder, major depressive episode (HCC) Overall mood is stable on current regimen of Lamictal  25 mg daily, Prozac  60 mg daily, and Seroquel  300 mg nightly.  She has a few manic days intermittently in which she finds herself painting and writing more often however has not engaged in any risky behaviors.  We did discuss having her follow back up with psychiatry however she is not interested in meds at this point.  She is currently seeing a therapist however has not been able to get into see them recently.  Will refill meds today.  Follow-up in 3 to 6 months.  Systolic murmur Soft systolic murmur noted on exam.  Most recent echo shows mild aortic regurgitation and mitral valve regurgitation.  Currently asymptomatic.  She will follow-up with cardiology as previously planned though do not anticipate that either of these will cause her any significant issues going forward.  Hypothyroidism Stable on Synthroid  75 mcg daily.  Will check TSH next blood draw.     Subjective:  HPI:  See A/P for status of chronic conditions.  Patient is here today for ED follow-up.  She went to the ED 4 days ago with weakness.  She has been having gaint  instability for years but 4 days ago had significant worsening of including sensation of heaviness in her left arm and leg.  She became very concerned about this and subsequently presented to the ED.  In the ED had workup including labs and MRI.  Her labs were stable.  MRI showed age-related atrophy.  She was discharged home.  She has done well the last few days.  She has had some improvement with her strength in her extremities however still feels unsteady with her gait.  She is here today with her daughter.       Objective:  Physical Exam: BP 124/63   Pulse 78   Temp (!) 97.5 F (36.4 C) (Temporal)   Ht 5' 7 (1.702 m)   Wt 166 lb 6.4 oz (75.5 kg)   LMP  (LMP Unknown)   SpO2 98%   BMI 26.06 kg/m   Gen: No acute distress, resting comfortably CV: Regular rate and rhythm with 2/6 systolic murmurs appreciated Pulm: Normal work of breathing, clear to auscultation bilaterally with no crackles, wheezes, or rhonchi Neuro: Grossly normal, moves all extremities Psych: Normal affect and thought content      Brigitta Pricer M. Daneil Dunker, MD 07/09/2023 3:09 PM

## 2023-07-09 NOTE — Assessment & Plan Note (Signed)
 Had lengthy discussion with patient and her daughter today regarding her unsteady gait and recent ED visit.  Her workup there was negative.  She has seen sports medicine in the past and they have referred her to physical therapy however she has had difficulty with being consistent with this.  She is agreeable to another course of physical therapy though would prefer for this to be done at home if possible.  Will place referral to home health for PT and strengthening exercises.  Depending on response to physical therapy would consider have her back up with sports medicine for further imaging per last sports medicine note.

## 2023-07-09 NOTE — Assessment & Plan Note (Signed)
 Stable on Synthroid  75 mcg daily.  Will check TSH next blood draw.

## 2023-07-13 ENCOUNTER — Inpatient Hospital Stay: Admitting: Family Medicine

## 2023-07-18 DIAGNOSIS — Z9181 History of falling: Secondary | ICD-10-CM | POA: Diagnosis not present

## 2023-07-18 DIAGNOSIS — E039 Hypothyroidism, unspecified: Secondary | ICD-10-CM | POA: Diagnosis not present

## 2023-07-18 DIAGNOSIS — I080 Rheumatic disorders of both mitral and aortic valves: Secondary | ICD-10-CM | POA: Diagnosis not present

## 2023-07-18 DIAGNOSIS — I10 Essential (primary) hypertension: Secondary | ICD-10-CM | POA: Diagnosis not present

## 2023-07-18 DIAGNOSIS — K219 Gastro-esophageal reflux disease without esophagitis: Secondary | ICD-10-CM | POA: Diagnosis not present

## 2023-07-20 DIAGNOSIS — Z9181 History of falling: Secondary | ICD-10-CM | POA: Diagnosis not present

## 2023-07-20 DIAGNOSIS — E039 Hypothyroidism, unspecified: Secondary | ICD-10-CM | POA: Diagnosis not present

## 2023-07-20 DIAGNOSIS — I10 Essential (primary) hypertension: Secondary | ICD-10-CM | POA: Diagnosis not present

## 2023-07-20 DIAGNOSIS — I080 Rheumatic disorders of both mitral and aortic valves: Secondary | ICD-10-CM | POA: Diagnosis not present

## 2023-07-20 DIAGNOSIS — K219 Gastro-esophageal reflux disease without esophagitis: Secondary | ICD-10-CM | POA: Diagnosis not present

## 2023-07-23 DIAGNOSIS — I10 Essential (primary) hypertension: Secondary | ICD-10-CM | POA: Diagnosis not present

## 2023-07-23 DIAGNOSIS — I080 Rheumatic disorders of both mitral and aortic valves: Secondary | ICD-10-CM | POA: Diagnosis not present

## 2023-07-23 DIAGNOSIS — K219 Gastro-esophageal reflux disease without esophagitis: Secondary | ICD-10-CM | POA: Diagnosis not present

## 2023-07-23 DIAGNOSIS — Z9181 History of falling: Secondary | ICD-10-CM | POA: Diagnosis not present

## 2023-07-23 DIAGNOSIS — E039 Hypothyroidism, unspecified: Secondary | ICD-10-CM | POA: Diagnosis not present

## 2023-07-26 ENCOUNTER — Telehealth: Payer: Self-pay | Admitting: Family Medicine

## 2023-07-26 NOTE — Telephone Encounter (Signed)
 Received faxed document Home Health Certificate (Order ID [id number] ), to be filled out by provider. Patient requested to send it back via Fax . Document is located in providers tray at front office.Please advise

## 2023-07-27 DIAGNOSIS — E039 Hypothyroidism, unspecified: Secondary | ICD-10-CM | POA: Diagnosis not present

## 2023-07-27 DIAGNOSIS — Z9181 History of falling: Secondary | ICD-10-CM | POA: Diagnosis not present

## 2023-07-27 DIAGNOSIS — F3181 Bipolar II disorder: Secondary | ICD-10-CM | POA: Diagnosis not present

## 2023-07-27 DIAGNOSIS — K219 Gastro-esophageal reflux disease without esophagitis: Secondary | ICD-10-CM | POA: Diagnosis not present

## 2023-07-27 DIAGNOSIS — I10 Essential (primary) hypertension: Secondary | ICD-10-CM | POA: Diagnosis not present

## 2023-07-27 DIAGNOSIS — I080 Rheumatic disorders of both mitral and aortic valves: Secondary | ICD-10-CM | POA: Diagnosis not present

## 2023-07-27 NOTE — Telephone Encounter (Signed)
 Form placed to in PCP for reviewed

## 2023-08-03 ENCOUNTER — Other Ambulatory Visit: Payer: Self-pay | Admitting: *Deleted

## 2023-08-03 ENCOUNTER — Encounter: Payer: Self-pay | Admitting: Family Medicine

## 2023-08-03 DIAGNOSIS — I080 Rheumatic disorders of both mitral and aortic valves: Secondary | ICD-10-CM | POA: Diagnosis not present

## 2023-08-03 DIAGNOSIS — E039 Hypothyroidism, unspecified: Secondary | ICD-10-CM | POA: Diagnosis not present

## 2023-08-03 DIAGNOSIS — K219 Gastro-esophageal reflux disease without esophagitis: Secondary | ICD-10-CM | POA: Diagnosis not present

## 2023-08-03 DIAGNOSIS — F3181 Bipolar II disorder: Secondary | ICD-10-CM

## 2023-08-03 DIAGNOSIS — I10 Essential (primary) hypertension: Secondary | ICD-10-CM | POA: Diagnosis not present

## 2023-08-03 DIAGNOSIS — Z9181 History of falling: Secondary | ICD-10-CM | POA: Diagnosis not present

## 2023-08-03 MED ORDER — QUETIAPINE FUMARATE 300 MG PO TABS: 300.0000 mg | ORAL_TABLET | Freq: Every day | ORAL | 0 refills | Status: DC

## 2023-08-03 NOTE — Telephone Encounter (Signed)
 Patient aware Rx send to [facility]

## 2023-08-05 NOTE — Progress Notes (Unsigned)
 [facility] Counselor/Therapist Progress Note  Patient ID: Shari Horn, MRN: [id number],    Date: 3/202/2025  Time Spent: 50 min In Person @ [facility] Office Time In: 11:00am Time Out: 11:50am   Treatment Type: Individual Therapy  Reported Symptoms: Elevated anx/dep & stress due to review of Family Hx, life events & current situation  Mental Status Exam: Appearance:  {PSY:22683}     Behavior: {PSY:21022743}  Motor: {PSY:22302}  Speech/Language:  {PSY:22685}  Affect: {PSY:22687}  Mood: {PSY:31886}  Thought process: {PSY:31888}  Thought content:   {PSY:575 857 5896}  Sensory/Perceptual disturbances:   {PSY:531-165-6954}  Orientation: {PSY:30297}  Attention: {PSY:22877}  Concentration: {PSY:640-657-5986}  Memory: {PSY:562-710-3660}  Fund of knowledge:  {PSY:640-657-5986}  Insight:   {PSY:640-657-5986}  Judgment:  {PSY:640-657-5986}  Impulse Control: {PSY:640-657-5986}   Risk Assessment: Danger to Self:  {PSY:22692} Self-injurious Behavior: {PSY:22692} Danger to Others: {PSY:22692} Duty to Warn:{PSY:311194} Physical Aggression / Violence:{PSY:21197} Access to Firearms a concern: {PSY:21197} Gang Involvement:{PSY:21197}  Subjective: ***   Interventions: {PSY:514-640-2458}  Diagnosis:No diagnosis found.  Plan: ***  Richerd LITTIE Ling, LMFT

## 2023-08-06 DIAGNOSIS — E039 Hypothyroidism, unspecified: Secondary | ICD-10-CM | POA: Diagnosis not present

## 2023-08-06 DIAGNOSIS — I10 Essential (primary) hypertension: Secondary | ICD-10-CM | POA: Diagnosis not present

## 2023-08-06 DIAGNOSIS — Z9181 History of falling: Secondary | ICD-10-CM | POA: Diagnosis not present

## 2023-08-06 DIAGNOSIS — I080 Rheumatic disorders of both mitral and aortic valves: Secondary | ICD-10-CM | POA: Diagnosis not present

## 2023-08-06 DIAGNOSIS — K219 Gastro-esophageal reflux disease without esophagitis: Secondary | ICD-10-CM | POA: Diagnosis not present

## 2023-08-09 ENCOUNTER — Encounter: Payer: Self-pay | Admitting: Family Medicine

## 2023-08-09 NOTE — Telephone Encounter (Signed)
See messge

## 2023-08-10 DIAGNOSIS — I080 Rheumatic disorders of both mitral and aortic valves: Secondary | ICD-10-CM | POA: Diagnosis not present

## 2023-08-10 DIAGNOSIS — Z9181 History of falling: Secondary | ICD-10-CM | POA: Diagnosis not present

## 2023-08-10 DIAGNOSIS — I10 Essential (primary) hypertension: Secondary | ICD-10-CM | POA: Diagnosis not present

## 2023-08-10 DIAGNOSIS — E039 Hypothyroidism, unspecified: Secondary | ICD-10-CM | POA: Diagnosis not present

## 2023-08-10 DIAGNOSIS — K219 Gastro-esophageal reflux disease without esophagitis: Secondary | ICD-10-CM | POA: Diagnosis not present

## 2023-08-10 NOTE — Telephone Encounter (Signed)
 I appreciate the update. I think that epidural corticosteroid injections would be beneficial however I will probably need to have a repeat MRI before they will pursue this further.  It is very difficult for us  to get this approved through insurance and typically she would be better off getting this ordered through a specialist.  Recommend referral to sports medicine orthopedics for further evaluation and they can assist with both of these issues.  Worth HERO. Kennyth, MD 08/10/2023 12:49 PM

## 2023-08-11 DIAGNOSIS — Z9181 History of falling: Secondary | ICD-10-CM | POA: Diagnosis not present

## 2023-08-11 DIAGNOSIS — I080 Rheumatic disorders of both mitral and aortic valves: Secondary | ICD-10-CM | POA: Diagnosis not present

## 2023-08-11 DIAGNOSIS — I10 Essential (primary) hypertension: Secondary | ICD-10-CM | POA: Diagnosis not present

## 2023-08-11 DIAGNOSIS — K219 Gastro-esophageal reflux disease without esophagitis: Secondary | ICD-10-CM | POA: Diagnosis not present

## 2023-08-11 DIAGNOSIS — E039 Hypothyroidism, unspecified: Secondary | ICD-10-CM | POA: Diagnosis not present

## 2023-08-13 ENCOUNTER — Encounter: Payer: Self-pay | Admitting: Family Medicine

## 2023-08-16 DIAGNOSIS — E039 Hypothyroidism, unspecified: Secondary | ICD-10-CM | POA: Diagnosis not present

## 2023-08-16 DIAGNOSIS — Z9181 History of falling: Secondary | ICD-10-CM | POA: Diagnosis not present

## 2023-08-16 DIAGNOSIS — I10 Essential (primary) hypertension: Secondary | ICD-10-CM | POA: Diagnosis not present

## 2023-08-16 DIAGNOSIS — I080 Rheumatic disorders of both mitral and aortic valves: Secondary | ICD-10-CM | POA: Diagnosis not present

## 2023-08-16 DIAGNOSIS — K219 Gastro-esophageal reflux disease without esophagitis: Secondary | ICD-10-CM | POA: Diagnosis not present

## 2023-08-16 NOTE — Telephone Encounter (Signed)
 noted

## 2023-08-16 NOTE — Telephone Encounter (Unsigned)
 Copied from CRM [id number]. Topic: Referral - Request for Referral >> Aug 16, 2023 12:12 PM Burnard DEL wrote: Did the patient discuss referral with their provider in the last year? Yes (If No - schedule appointment) (If Yes - send message)  Appointment offered? Yes  Type of order/referral and detailed reason for visit: Neurologist Numbness in feet/legs/calf left side Preference of office, provider, location: Shari Horn  If referral order, have you been seen by this specialty before? Yes (If Yes, this issue or another issue? When? Where? 3Years ago Can we respond through MyChart? No

## 2023-08-17 NOTE — Telephone Encounter (Signed)
 Noted

## 2023-08-19 DIAGNOSIS — I10 Essential (primary) hypertension: Secondary | ICD-10-CM | POA: Diagnosis not present

## 2023-08-19 DIAGNOSIS — Z9181 History of falling: Secondary | ICD-10-CM | POA: Diagnosis not present

## 2023-08-19 DIAGNOSIS — K219 Gastro-esophageal reflux disease without esophagitis: Secondary | ICD-10-CM | POA: Diagnosis not present

## 2023-08-19 DIAGNOSIS — I080 Rheumatic disorders of both mitral and aortic valves: Secondary | ICD-10-CM | POA: Diagnosis not present

## 2023-08-19 DIAGNOSIS — E039 Hypothyroidism, unspecified: Secondary | ICD-10-CM | POA: Diagnosis not present

## 2023-08-24 DIAGNOSIS — I080 Rheumatic disorders of both mitral and aortic valves: Secondary | ICD-10-CM | POA: Diagnosis not present

## 2023-08-24 DIAGNOSIS — I10 Essential (primary) hypertension: Secondary | ICD-10-CM | POA: Diagnosis not present

## 2023-08-24 DIAGNOSIS — E039 Hypothyroidism, unspecified: Secondary | ICD-10-CM | POA: Diagnosis not present

## 2023-08-24 DIAGNOSIS — K219 Gastro-esophageal reflux disease without esophagitis: Secondary | ICD-10-CM | POA: Diagnosis not present

## 2023-08-24 DIAGNOSIS — Z9181 History of falling: Secondary | ICD-10-CM | POA: Diagnosis not present

## 2023-08-30 ENCOUNTER — Ambulatory Visit: Payer: Self-pay

## 2023-08-30 NOTE — Telephone Encounter (Signed)
 FYI Only or Action Required?: Action required by provider: clinical question for provider and update on patient condition.  Patient was last seen in primary care on 07/09/2023 by Kennyth Worth HERO, MD.  Called Nurse Triage reporting Extremity Weakness.  Symptoms began several days ago.  Interventions attempted: Nothing.  Symptoms are: gradually worsening.  Triage Disposition: Go to ED Now (Notify PCP)  Patient/caregiver understands and will follow disposition?: No, refuses dispositionCopied from CRM #[id number]. Topic: Clinical - Red Word Triage >> Aug 30, 2023  1:59 PM Mia F wrote: Red Word that prompted transfer to Nurse Triage: Weakness. Physical threapist calling about a medical issue pt had over the weekend. On saturday night she had a weakness to where she can barely get up and move. It lasted several hours from dinner until bedtime. She is better now but still slightly weak. Left leg has some numbness. Weakness has been going on for several months but worsening. Reason for Disposition  [1] Weakness (i.e., paralysis, loss of muscle strength) of the face, arm / hand, or leg / foot on one side of the body AND [2] sudden onset AND [3] brief (now gone)  Answer Assessment - Initial Assessment Questions Pt noticed Saturday night she couldn't move and eventually  when able to she had extremely hard time getting around. Pt currently  has numbness in left leg and double vision.RN advised ED and pt refused at this time. Pt stated she went to ED several weeks ago and had MRI that showed no stroke tumor. I don't need to go back. CAL notified.    1. SYMPTOM: What is the main symptom you are concerned about? (e.g., weakness, numbness)     Weakness overall, but left leg is worse 2. ONSET: When did this start? (e.g., minutes, hours, days; while sleeping)   Saturday night  3. LAST NORMAL: When was the last time you (the patient) were normal (no symptoms)?     Saturday  4. PATTERN Does this come  and go, or has it been constant since it started?  Is it present now?     Constant  5. CARDIAC SYMPTOMS: Have you had any of the following symptoms: chest pain, difficulty breathing, palpitations?     denies 6. NEUROLOGIC SYMPTOMS: Have you had any of the following symptoms: headache, dizziness, vision loss, double vision, changes in speech, unsteady on your feet?     Double vision but not new, unsteady on feet; 2 falls within 2 weeks 7. OTHER SYMPTOMS: Do you have any other symptoms?     More difficulty walking with assistance  Protocols used: Neurologic Deficit-A-AH

## 2023-08-31 NOTE — Telephone Encounter (Signed)
 Spoke with patient, stated was paralyzed two different times this weekend,  Was at ED a couple of days ago done MRI of the brain and no sign of stroke or tumor  Requesting  an MRI of the back, notified Dr Kennyth does not have any opening for this week  Advise to see any other provider, stated she will call Dr Darleene First as recommenced per Dr Kennyth

## 2023-09-01 ENCOUNTER — Telehealth: Payer: Self-pay | Admitting: *Deleted

## 2023-09-01 NOTE — Telephone Encounter (Signed)
 Copied from CRM [id number]. Topic: Clinical - Request for Lab/Test Order >> Aug 31, 2023  3:57 PM Drema MATSU wrote: Reason for CRM: Patient wants to let the doctor know that she called Dr. Joane the Sports Medic[facility] doctor and he is out of the office until the middle of September. She states that she has already been to the ER and feels like she really needs the Complete MRI from her neck down to her feet. She is requesting a callback from nurse.   See Note, patient has an office visit on 09/07/2023 [facility]

## 2023-09-01 NOTE — Telephone Encounter (Signed)
 Ok wit strongly recommend that she go back to the emergency room if she is still having neurologic symptoms.  They will be able to obtain an MRI much quicker than we can do as an outpatient.

## 2023-09-02 NOTE — Telephone Encounter (Signed)
 Left message to return call to our office at their convenience.

## 2023-09-06 NOTE — Telephone Encounter (Signed)
 Appt tomorrow

## 2023-09-07 ENCOUNTER — Ambulatory Visit (HOSPITAL_COMMUNITY)
Admission: RE | Admit: 2023-09-07 | Discharge: 2023-09-07 | Disposition: A | Discharge status: Home / Self Care | Source: Ambulatory Visit | Attending: Family Medicine | Admitting: Family Medicine

## 2023-09-07 ENCOUNTER — Telehealth (INDEPENDENT_AMBULATORY_CARE_PROVIDER_SITE_OTHER): Admitting: Family Medicine

## 2023-09-07 DIAGNOSIS — M549 Dorsalgia, unspecified: Secondary | ICD-10-CM

## 2023-09-07 DIAGNOSIS — G8929 Other chronic pain: Secondary | ICD-10-CM

## 2023-09-07 DIAGNOSIS — M48061 Spinal stenosis, lumbar region without neurogenic claudication: Secondary | ICD-10-CM | POA: Diagnosis not present

## 2023-09-07 DIAGNOSIS — R2681 Unsteadiness on feet: Secondary | ICD-10-CM | POA: Diagnosis not present

## 2023-09-07 DIAGNOSIS — M4804 Spinal stenosis, thoracic region: Secondary | ICD-10-CM | POA: Diagnosis not present

## 2023-09-07 DIAGNOSIS — M5116 Intervertebral disc disorders with radiculopathy, lumbar region: Secondary | ICD-10-CM | POA: Diagnosis not present

## 2023-09-07 DIAGNOSIS — M4854XA Collapsed vertebra, not elsewhere classified, thoracic region, initial encounter for fracture: Secondary | ICD-10-CM | POA: Diagnosis not present

## 2023-09-07 DIAGNOSIS — M4802 Spinal stenosis, cervical region: Secondary | ICD-10-CM | POA: Diagnosis not present

## 2023-09-07 DIAGNOSIS — M47812 Spondylosis without myelopathy or radiculopathy, cervical region: Secondary | ICD-10-CM | POA: Diagnosis not present

## 2023-09-07 NOTE — Assessment & Plan Note (Addendum)
 Pain is overall manageable though does have known history of lumbar radiculopathy based on previous imaging and nerve conduction studies.  She will be seeing spine specialist soon as above.  We did refer her to physical therapy however this was discontinued due to her unsteady gait.

## 2023-09-07 NOTE — Progress Notes (Signed)
   Shari Horn is a 83 y.o. female who presents today for a virtual office visit.  Assessment/Plan:  Chronic Problems Addressed Today: Unsteady gait We discussed limitations of virtual visit and inability to perform physical exam.  Her symptoms have worsened significantly over the last couple of months.  She does have known lumbar radiculopathy based on previous MRIs and EMG from about 4 years ago however given the worsening symptoms would be reasonable for us  to pursue additional imaging at this point.  She does have an upcoming appoint with the specialist however cannot see them for several weeks.  Will obtain MRI of C-spine, T-spine, and L-spine.  She is aware of reasons to return to care and seek emergent care.  Will defer further management to her spinal specialist.  Chronic back pain Is overall manageable though does have known history of lumbar radiculopathy based on previous imaging and nerve conduction studies.  She will be seeing spine specialist soon as above.  We did refer her to physical therapy however this was discontinued due to her unsteady gait.     Subjective:  HPI:  See Assessment / plan for status of chronic conditions.  Patient with her daughter today.  I last saw him 2 months ago.  Since our last visit she has had ongoing issues with gait instability.  We did discuss this at her most recent visit.  Couple of months ago when she was recently in the ED for unsteady gait.  She has been working with physical therapy however this had to be discontinued due to her unsteadiness.  She is having occasional numbness and tingling in her legs as well.  She does have an upcoming appointment with a spinal specialist however they would not be able to see her for another month or so.  Family is requesting that we check MRI before her upcoming appointment.  She did have a brain MRI a couple of months ago in the ED which was negative.       Objective/Observations  Physical Exam: Gen: NAD,  resting comfortably Pulm: Normal work of breathing Neuro: Grossly normal, moves all extremities Psych: Normal affect and thought content  Virtual Visit via Video   I connected with Shari Horn on 09/07/23 at  1:20 PM EDT by a video enabled telemedicine application and verified that I am speaking with the correct person using two identifiers. The limitations of evaluation and management by telemedicine and the availability of in person appointments were discussed. The patient expressed understanding and agreed to proceed.   Patient location: Home Provider location: [place] [employer] Persons participating in the virtual visit: Myself and Patient     Worth HERO. Kennyth, MD 09/07/2023 1:10 PM

## 2023-09-07 NOTE — Assessment & Plan Note (Signed)
 We discussed limitations of virtual visit and inability to perform physical exam.  Her symptoms have worsened significantly over the last couple of months.  She does have known lumbar radiculopathy based on previous MRIs and EMG from about 4 years ago however given the worsening symptoms would be reasonable for us  to pursue additional imaging at this point.  She does have an upcoming appoint with the specialist however cannot see them for several weeks.  Will obtain MRI of C-spine, T-spine, and L-spine.  She is aware of reasons to return to care and seek emergent care.  Will defer further management to her spinal specialist.

## 2023-09-08 ENCOUNTER — Ambulatory Visit: Payer: Self-pay | Admitting: Family Medicine

## 2023-09-08 NOTE — Progress Notes (Signed)
 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  She also has several areas of foraminal stenosis at T10-11, L1-L2, L4-L5, and L2-L3.  Recommend she discuss this with her upcoming appoint with Dr. Bonner.

## 2023-09-08 NOTE — Progress Notes (Signed)
 Her cervical spine MRI shows multiple level degenerative changes most advanced at C5-6 where there is moderate spinal stenosis.  She also has spinal stenosis at C6-7.  Recommend she discuss this further with Dr. Bonner at her upcoming appointment.

## 2023-09-13 DIAGNOSIS — I080 Rheumatic disorders of both mitral and aortic valves: Secondary | ICD-10-CM | POA: Diagnosis not present

## 2023-09-15 ENCOUNTER — Telehealth: Payer: Self-pay | Admitting: Family Medicine

## 2023-09-15 NOTE — Telephone Encounter (Signed)
 [facility] faxed Home Health Certificate (Order [place] [id number]), to be filled out by provider. Patient requested to send it back via Fax within ASAP. Document is located in providers tray at front office.Please advise at [phone].

## 2023-09-16 NOTE — Telephone Encounter (Signed)
Form placed to be reviewed in PCP office

## 2023-09-17 NOTE — Telephone Encounter (Signed)
Form faxed to [phone] Form placed to be scan in patient chart

## 2023-09-22 ENCOUNTER — Encounter: Payer: Self-pay | Admitting: Family Medicine

## 2023-09-22 ENCOUNTER — Other Ambulatory Visit: Payer: Self-pay | Admitting: *Deleted

## 2023-09-22 ENCOUNTER — Other Ambulatory Visit: Payer: Self-pay | Admitting: Family Medicine

## 2023-09-22 MED ORDER — LAMOTRIGINE 25 MG PO TABS: 25.0000 mg | ORAL_TABLET | Freq: Every day | ORAL | 0 refills | Status: DC

## 2023-09-22 NOTE — Telephone Encounter (Signed)
 Copied from CRM #[id number]. Topic: Clinical - Medication Refill >> Sep 22, 2023  8:50 AM Berneda F wrote: Medication: lamoTRIgine  (LAMICTAL ) 25 MG tablet   Please note, insurance requires this to be a 90-day supply to cover it please.   Has the patient contacted their pharmacy? Yes (Agent: If no, request that the patient contact the pharmacy for the refill. If patient does not wish to contact the pharmacy document the reason why and proceed with request.) (Agent: If yes, when and what did the pharmacy advise?)  This is the patient's preferred pharmacy:  [id number] - [place], [place] - [address] AT [address] [facility] [address] [place] [address] Phone: [phone] Fax: [phone]  Is this the correct pharmacy for this prescription? Yes If no, delete pharmacy and type the correct one.   Has the prescription been filled recently? No  Is the patient out of the medication? No, but only has 3-4 left  Has the patient been seen for an appointment in the last year OR does the patient have an upcoming appointment? Yes  Can we respond through MyChart? Yes  Agent: Please be advised that Rx refills may take up to 3 business days. We ask that you follow-up with your pharmacy.

## 2023-09-22 NOTE — Telephone Encounter (Signed)
 Rx send #[id number] to pharmacy

## 2023-10-04 DIAGNOSIS — R6250 Unspecified lack of expected normal physiological development in childhood: Secondary | ICD-10-CM | POA: Diagnosis not present

## 2023-10-04 DIAGNOSIS — R200 Anesthesia of skin: Secondary | ICD-10-CM | POA: Diagnosis not present

## 2023-10-04 DIAGNOSIS — Z9181 History of falling: Secondary | ICD-10-CM | POA: Diagnosis not present

## 2023-10-04 DIAGNOSIS — M5416 Radiculopathy, lumbar region: Secondary | ICD-10-CM | POA: Diagnosis not present

## 2023-10-12 DIAGNOSIS — M5416 Radiculopathy, lumbar region: Secondary | ICD-10-CM | POA: Diagnosis not present

## 2023-10-14 ENCOUNTER — Ambulatory Visit: Admitting: Family Medicine

## 2023-10-30 ENCOUNTER — Other Ambulatory Visit: Payer: Self-pay | Admitting: Family Medicine

## 2023-10-30 DIAGNOSIS — F3181 Bipolar II disorder: Secondary | ICD-10-CM

## 2023-11-18 ENCOUNTER — Ambulatory Visit: Admitting: Family Medicine

## 2023-11-22 ENCOUNTER — Other Ambulatory Visit: Payer: Self-pay | Admitting: Internal Medicine

## 2023-12-13 ENCOUNTER — Ambulatory Visit (HOSPITAL_COMMUNITY)
Admission: RE | Admit: 2023-12-13 | Source: Ambulatory Visit | Attending: Internal Medicine | Admitting: Internal Medicine

## 2023-12-27 ENCOUNTER — Other Ambulatory Visit: Payer: Self-pay | Admitting: Family Medicine

## 2023-12-27 ENCOUNTER — Ambulatory Visit (HOSPITAL_COMMUNITY)

## 2024-01-06 ENCOUNTER — Other Ambulatory Visit: Payer: Self-pay | Admitting: Internal Medicine

## 2024-02-01 ENCOUNTER — Encounter: Payer: Self-pay | Admitting: Family Medicine

## 2024-02-02 ENCOUNTER — Other Ambulatory Visit: Payer: Self-pay | Admitting: *Deleted

## 2024-02-02 MED ORDER — ROSUVASTATIN CALCIUM 40 MG PO TABS: 40.0000 mg | ORAL_TABLET | Freq: Every day | ORAL | 0 refills | Status: AC

## 2024-02-02 NOTE — Telephone Encounter (Signed)
 We can take over. Please send in 90 day supply for her crestor .

## 2024-02-03 ENCOUNTER — Telehealth: Payer: Self-pay | Admitting: *Deleted

## 2024-02-03 NOTE — Telephone Encounter (Signed)
 Copied from CRM [id number]. Topic: Referral - Request for Referral >> Feb 02, 2024  3:14 PM Deleta RAMAN wrote: Did the patient discuss referral with their provider in the last year? No (If No - schedule appointment) (If Yes - send message)  Appointment offered? No  Type of order/referral and detailed reason for visit: muscle pain  Preference of office, provider, location: [address] [place] Fax number [phone]  If referral order, have you been seen by this specialty before? Yes (If Yes, this issue or another issue? When? Where? Similar issue arthritis and problems with muscle. Dr. [facility] Can we respond through MyChart? No    Patient need OV for referral  [facility]

## 2024-02-08 ENCOUNTER — Ambulatory Visit: Admitting: Family Medicine

## 2024-02-24 ENCOUNTER — Other Ambulatory Visit: Payer: Self-pay | Admitting: Family Medicine

## 2024-02-24 DIAGNOSIS — F3181 Bipolar II disorder: Secondary | ICD-10-CM
